# Patient Record
Sex: Female | Born: 1940
Health system: Southern US, Community
[De-identification: ages and names within clinical notes are randomized; demographics above are authoritative.]

## PROBLEM LIST (undated history)

## (undated) DIAGNOSIS — I4891 Unspecified atrial fibrillation: Secondary | ICD-10-CM

## (undated) DIAGNOSIS — J449 Chronic obstructive pulmonary disease, unspecified: Secondary | ICD-10-CM

## (undated) DIAGNOSIS — R0902 Hypoxemia: Secondary | ICD-10-CM

## (undated) DIAGNOSIS — E785 Hyperlipidemia, unspecified: Secondary | ICD-10-CM

## (undated) DIAGNOSIS — M810 Age-related osteoporosis without current pathological fracture: Secondary | ICD-10-CM

## (undated) DIAGNOSIS — N9489 Other specified conditions associated with female genital organs and menstrual cycle: Secondary | ICD-10-CM

## (undated) DIAGNOSIS — K219 Gastro-esophageal reflux disease without esophagitis: Secondary | ICD-10-CM

## (undated) HISTORY — PX: KYPHOPLASTY: SHX5884

## (undated) HISTORY — DX: Chronic obstructive pulmonary disease, unspecified: J44.9

## (undated) HISTORY — DX: Gastro-esophageal reflux disease without esophagitis: K21.9

## (undated) HISTORY — DX: Hypoxemia: R09.02

## (undated) HISTORY — DX: Unspecified atrial fibrillation: I48.91

## (undated) HISTORY — DX: Hyperlipidemia, unspecified: E78.5

## (undated) HISTORY — DX: Other specified conditions associated with female genital organs and menstrual cycle: N94.89

## (undated) HISTORY — DX: Age-related osteoporosis without current pathological fracture: M81.0

## (undated) HISTORY — PX: APPENDECTOMY: SHX54

---

## 2014-07-13 DIAGNOSIS — E785 Hyperlipidemia, unspecified: Secondary | ICD-10-CM | POA: Diagnosis not present

## 2014-07-13 DIAGNOSIS — F17210 Nicotine dependence, cigarettes, uncomplicated: Secondary | ICD-10-CM | POA: Diagnosis not present

## 2014-07-13 DIAGNOSIS — R9431 Abnormal electrocardiogram [ECG] [EKG]: Secondary | ICD-10-CM | POA: Diagnosis not present

## 2014-07-13 DIAGNOSIS — Z720 Tobacco use: Secondary | ICD-10-CM | POA: Diagnosis not present

## 2014-07-13 DIAGNOSIS — M1990 Unspecified osteoarthritis, unspecified site: Secondary | ICD-10-CM | POA: Diagnosis not present

## 2014-07-13 DIAGNOSIS — Z8249 Family history of ischemic heart disease and other diseases of the circulatory system: Secondary | ICD-10-CM | POA: Diagnosis not present

## 2014-07-13 DIAGNOSIS — R0789 Other chest pain: Secondary | ICD-10-CM | POA: Diagnosis not present

## 2014-07-13 DIAGNOSIS — I10 Essential (primary) hypertension: Secondary | ICD-10-CM | POA: Diagnosis not present

## 2014-07-13 DIAGNOSIS — I350 Nonrheumatic aortic (valve) stenosis: Secondary | ICD-10-CM | POA: Diagnosis not present

## 2014-07-13 DIAGNOSIS — Z9851 Tubal ligation status: Secondary | ICD-10-CM | POA: Diagnosis not present

## 2014-07-13 DIAGNOSIS — I493 Ventricular premature depolarization: Secondary | ICD-10-CM | POA: Diagnosis not present

## 2014-07-13 DIAGNOSIS — R001 Bradycardia, unspecified: Secondary | ICD-10-CM | POA: Diagnosis not present

## 2014-07-13 DIAGNOSIS — R072 Precordial pain: Secondary | ICD-10-CM | POA: Diagnosis not present

## 2014-07-13 DIAGNOSIS — R002 Palpitations: Secondary | ICD-10-CM | POA: Diagnosis not present

## 2014-07-13 DIAGNOSIS — J449 Chronic obstructive pulmonary disease, unspecified: Secondary | ICD-10-CM | POA: Diagnosis not present

## 2014-07-13 DIAGNOSIS — Z716 Tobacco abuse counseling: Secondary | ICD-10-CM | POA: Diagnosis not present

## 2014-07-13 DIAGNOSIS — E780 Pure hypercholesterolemia: Secondary | ICD-10-CM | POA: Diagnosis not present

## 2014-07-13 DIAGNOSIS — I499 Cardiac arrhythmia, unspecified: Secondary | ICD-10-CM | POA: Diagnosis not present

## 2014-07-13 DIAGNOSIS — R008 Other abnormalities of heart beat: Secondary | ICD-10-CM | POA: Diagnosis not present

## 2014-07-13 DIAGNOSIS — K08409 Partial loss of teeth, unspecified cause, unspecified class: Secondary | ICD-10-CM | POA: Diagnosis not present

## 2014-07-13 DIAGNOSIS — J209 Acute bronchitis, unspecified: Secondary | ICD-10-CM | POA: Diagnosis not present

## 2014-07-13 DIAGNOSIS — J441 Chronic obstructive pulmonary disease with (acute) exacerbation: Secondary | ICD-10-CM | POA: Diagnosis not present

## 2014-07-13 DIAGNOSIS — I498 Other specified cardiac arrhythmias: Secondary | ICD-10-CM | POA: Diagnosis not present

## 2014-07-18 DIAGNOSIS — D3910 Neoplasm of uncertain behavior of unspecified ovary: Secondary | ICD-10-CM | POA: Diagnosis not present

## 2014-07-18 DIAGNOSIS — R002 Palpitations: Secondary | ICD-10-CM | POA: Diagnosis not present

## 2014-08-01 DIAGNOSIS — J441 Chronic obstructive pulmonary disease with (acute) exacerbation: Secondary | ICD-10-CM | POA: Diagnosis not present

## 2014-08-06 DIAGNOSIS — R079 Chest pain, unspecified: Secondary | ICD-10-CM | POA: Diagnosis not present

## 2014-08-13 DIAGNOSIS — J449 Chronic obstructive pulmonary disease, unspecified: Secondary | ICD-10-CM | POA: Diagnosis not present

## 2014-08-15 DIAGNOSIS — I493 Ventricular premature depolarization: Secondary | ICD-10-CM | POA: Diagnosis not present

## 2014-08-15 DIAGNOSIS — J309 Allergic rhinitis, unspecified: Secondary | ICD-10-CM | POA: Diagnosis not present

## 2014-08-15 DIAGNOSIS — J449 Chronic obstructive pulmonary disease, unspecified: Secondary | ICD-10-CM | POA: Diagnosis not present

## 2014-09-13 DIAGNOSIS — J449 Chronic obstructive pulmonary disease, unspecified: Secondary | ICD-10-CM | POA: Diagnosis not present

## 2014-09-17 DIAGNOSIS — J449 Chronic obstructive pulmonary disease, unspecified: Secondary | ICD-10-CM | POA: Diagnosis not present

## 2014-09-21 DIAGNOSIS — I10 Essential (primary) hypertension: Secondary | ICD-10-CM | POA: Diagnosis not present

## 2014-10-13 DIAGNOSIS — J449 Chronic obstructive pulmonary disease, unspecified: Secondary | ICD-10-CM | POA: Diagnosis not present

## 2014-11-13 DIAGNOSIS — J449 Chronic obstructive pulmonary disease, unspecified: Secondary | ICD-10-CM | POA: Diagnosis not present

## 2014-11-21 DIAGNOSIS — J441 Chronic obstructive pulmonary disease with (acute) exacerbation: Secondary | ICD-10-CM | POA: Diagnosis not present

## 2014-11-21 DIAGNOSIS — I10 Essential (primary) hypertension: Secondary | ICD-10-CM | POA: Diagnosis not present

## 2014-11-21 DIAGNOSIS — E782 Mixed hyperlipidemia: Secondary | ICD-10-CM | POA: Diagnosis not present

## 2014-11-29 DIAGNOSIS — E871 Hypo-osmolality and hyponatremia: Secondary | ICD-10-CM | POA: Diagnosis not present

## 2014-12-13 DIAGNOSIS — J449 Chronic obstructive pulmonary disease, unspecified: Secondary | ICD-10-CM | POA: Diagnosis not present

## 2015-01-13 DIAGNOSIS — J449 Chronic obstructive pulmonary disease, unspecified: Secondary | ICD-10-CM | POA: Diagnosis not present

## 2015-02-13 DIAGNOSIS — J449 Chronic obstructive pulmonary disease, unspecified: Secondary | ICD-10-CM | POA: Diagnosis not present

## 2015-02-26 DIAGNOSIS — J449 Chronic obstructive pulmonary disease, unspecified: Secondary | ICD-10-CM | POA: Diagnosis not present

## 2015-02-26 DIAGNOSIS — Z23 Encounter for immunization: Secondary | ICD-10-CM | POA: Diagnosis not present

## 2015-02-26 DIAGNOSIS — E782 Mixed hyperlipidemia: Secondary | ICD-10-CM | POA: Diagnosis not present

## 2015-02-26 DIAGNOSIS — I10 Essential (primary) hypertension: Secondary | ICD-10-CM | POA: Diagnosis not present

## 2015-04-15 DIAGNOSIS — J449 Chronic obstructive pulmonary disease, unspecified: Secondary | ICD-10-CM | POA: Diagnosis not present

## 2015-05-15 DIAGNOSIS — J449 Chronic obstructive pulmonary disease, unspecified: Secondary | ICD-10-CM | POA: Diagnosis not present

## 2015-06-15 DIAGNOSIS — J449 Chronic obstructive pulmonary disease, unspecified: Secondary | ICD-10-CM | POA: Diagnosis not present

## 2015-06-28 DIAGNOSIS — J449 Chronic obstructive pulmonary disease, unspecified: Secondary | ICD-10-CM | POA: Diagnosis not present

## 2015-06-28 DIAGNOSIS — J441 Chronic obstructive pulmonary disease with (acute) exacerbation: Secondary | ICD-10-CM | POA: Diagnosis not present

## 2015-06-28 DIAGNOSIS — I10 Essential (primary) hypertension: Secondary | ICD-10-CM | POA: Diagnosis not present

## 2015-06-28 DIAGNOSIS — E782 Mixed hyperlipidemia: Secondary | ICD-10-CM | POA: Diagnosis not present

## 2015-07-16 DIAGNOSIS — J449 Chronic obstructive pulmonary disease, unspecified: Secondary | ICD-10-CM | POA: Diagnosis not present

## 2015-08-13 DIAGNOSIS — J449 Chronic obstructive pulmonary disease, unspecified: Secondary | ICD-10-CM | POA: Diagnosis not present

## 2015-09-13 DIAGNOSIS — J449 Chronic obstructive pulmonary disease, unspecified: Secondary | ICD-10-CM | POA: Diagnosis not present

## 2015-10-13 DIAGNOSIS — J449 Chronic obstructive pulmonary disease, unspecified: Secondary | ICD-10-CM | POA: Diagnosis not present

## 2015-11-13 DIAGNOSIS — J449 Chronic obstructive pulmonary disease, unspecified: Secondary | ICD-10-CM | POA: Diagnosis not present

## 2015-12-13 DIAGNOSIS — R0600 Dyspnea, unspecified: Secondary | ICD-10-CM | POA: Diagnosis not present

## 2015-12-13 DIAGNOSIS — R0602 Shortness of breath: Secondary | ICD-10-CM | POA: Diagnosis not present

## 2015-12-13 DIAGNOSIS — J441 Chronic obstructive pulmonary disease with (acute) exacerbation: Secondary | ICD-10-CM | POA: Diagnosis not present

## 2015-12-13 DIAGNOSIS — J449 Chronic obstructive pulmonary disease, unspecified: Secondary | ICD-10-CM | POA: Diagnosis not present

## 2015-12-20 DIAGNOSIS — I10 Essential (primary) hypertension: Secondary | ICD-10-CM | POA: Diagnosis not present

## 2015-12-20 DIAGNOSIS — J441 Chronic obstructive pulmonary disease with (acute) exacerbation: Secondary | ICD-10-CM | POA: Diagnosis not present

## 2015-12-20 DIAGNOSIS — E782 Mixed hyperlipidemia: Secondary | ICD-10-CM | POA: Diagnosis not present

## 2015-12-20 DIAGNOSIS — R5383 Other fatigue: Secondary | ICD-10-CM | POA: Diagnosis not present

## 2016-01-13 DIAGNOSIS — J449 Chronic obstructive pulmonary disease, unspecified: Secondary | ICD-10-CM | POA: Diagnosis not present

## 2016-02-13 DIAGNOSIS — J449 Chronic obstructive pulmonary disease, unspecified: Secondary | ICD-10-CM | POA: Diagnosis not present

## 2016-03-14 DIAGNOSIS — J449 Chronic obstructive pulmonary disease, unspecified: Secondary | ICD-10-CM | POA: Diagnosis not present

## 2016-04-14 DIAGNOSIS — J449 Chronic obstructive pulmonary disease, unspecified: Secondary | ICD-10-CM | POA: Diagnosis not present

## 2016-05-14 DIAGNOSIS — J449 Chronic obstructive pulmonary disease, unspecified: Secondary | ICD-10-CM | POA: Diagnosis not present

## 2016-06-02 DIAGNOSIS — Z7982 Long term (current) use of aspirin: Secondary | ICD-10-CM | POA: Diagnosis not present

## 2016-06-02 DIAGNOSIS — J449 Chronic obstructive pulmonary disease, unspecified: Secondary | ICD-10-CM | POA: Diagnosis not present

## 2016-06-02 DIAGNOSIS — Z79899 Other long term (current) drug therapy: Secondary | ICD-10-CM | POA: Diagnosis not present

## 2016-06-02 DIAGNOSIS — R109 Unspecified abdominal pain: Secondary | ICD-10-CM | POA: Diagnosis not present

## 2016-06-02 DIAGNOSIS — K3580 Unspecified acute appendicitis: Secondary | ICD-10-CM | POA: Diagnosis not present

## 2016-06-02 DIAGNOSIS — I10 Essential (primary) hypertension: Secondary | ICD-10-CM | POA: Diagnosis not present

## 2016-06-02 DIAGNOSIS — K353 Acute appendicitis with localized peritonitis: Secondary | ICD-10-CM | POA: Diagnosis not present

## 2016-06-02 DIAGNOSIS — R079 Chest pain, unspecified: Secondary | ICD-10-CM | POA: Diagnosis not present

## 2016-06-02 DIAGNOSIS — R0602 Shortness of breath: Secondary | ICD-10-CM | POA: Diagnosis not present

## 2016-06-03 DIAGNOSIS — K3580 Unspecified acute appendicitis: Secondary | ICD-10-CM | POA: Diagnosis not present

## 2016-06-14 DIAGNOSIS — J449 Chronic obstructive pulmonary disease, unspecified: Secondary | ICD-10-CM | POA: Diagnosis not present

## 2016-06-25 DIAGNOSIS — K353 Acute appendicitis with localized peritonitis, without perforation or gangrene: Secondary | ICD-10-CM

## 2016-06-25 HISTORY — DX: Acute appendicitis with localized peritonitis, without perforation or gangrene: K35.30

## 2016-07-13 DIAGNOSIS — I10 Essential (primary) hypertension: Secondary | ICD-10-CM | POA: Diagnosis not present

## 2016-07-13 DIAGNOSIS — E782 Mixed hyperlipidemia: Secondary | ICD-10-CM | POA: Diagnosis not present

## 2016-07-13 DIAGNOSIS — J449 Chronic obstructive pulmonary disease, unspecified: Secondary | ICD-10-CM | POA: Diagnosis not present

## 2016-07-15 DIAGNOSIS — J449 Chronic obstructive pulmonary disease, unspecified: Secondary | ICD-10-CM | POA: Diagnosis not present

## 2016-08-12 DIAGNOSIS — J449 Chronic obstructive pulmonary disease, unspecified: Secondary | ICD-10-CM | POA: Diagnosis not present

## 2016-09-10 DIAGNOSIS — J441 Chronic obstructive pulmonary disease with (acute) exacerbation: Secondary | ICD-10-CM | POA: Diagnosis not present

## 2016-09-12 DIAGNOSIS — J449 Chronic obstructive pulmonary disease, unspecified: Secondary | ICD-10-CM | POA: Diagnosis not present

## 2016-10-12 DIAGNOSIS — J449 Chronic obstructive pulmonary disease, unspecified: Secondary | ICD-10-CM | POA: Diagnosis not present

## 2016-11-12 DIAGNOSIS — J449 Chronic obstructive pulmonary disease, unspecified: Secondary | ICD-10-CM | POA: Diagnosis not present

## 2016-11-24 DIAGNOSIS — J441 Chronic obstructive pulmonary disease with (acute) exacerbation: Secondary | ICD-10-CM | POA: Diagnosis not present

## 2016-12-12 DIAGNOSIS — J449 Chronic obstructive pulmonary disease, unspecified: Secondary | ICD-10-CM | POA: Diagnosis not present

## 2017-01-12 DIAGNOSIS — J449 Chronic obstructive pulmonary disease, unspecified: Secondary | ICD-10-CM | POA: Diagnosis not present

## 2017-02-12 DIAGNOSIS — J449 Chronic obstructive pulmonary disease, unspecified: Secondary | ICD-10-CM | POA: Diagnosis not present

## 2017-03-14 DIAGNOSIS — J449 Chronic obstructive pulmonary disease, unspecified: Secondary | ICD-10-CM | POA: Diagnosis not present

## 2017-05-14 DIAGNOSIS — J449 Chronic obstructive pulmonary disease, unspecified: Secondary | ICD-10-CM | POA: Diagnosis not present

## 2017-06-14 DIAGNOSIS — J449 Chronic obstructive pulmonary disease, unspecified: Secondary | ICD-10-CM | POA: Diagnosis not present

## 2017-07-15 DIAGNOSIS — J449 Chronic obstructive pulmonary disease, unspecified: Secondary | ICD-10-CM | POA: Diagnosis not present

## 2017-07-22 DIAGNOSIS — I10 Essential (primary) hypertension: Secondary | ICD-10-CM | POA: Diagnosis not present

## 2017-07-22 DIAGNOSIS — E782 Mixed hyperlipidemia: Secondary | ICD-10-CM | POA: Diagnosis not present

## 2017-07-22 DIAGNOSIS — J441 Chronic obstructive pulmonary disease with (acute) exacerbation: Secondary | ICD-10-CM | POA: Diagnosis not present

## 2017-08-12 DIAGNOSIS — J449 Chronic obstructive pulmonary disease, unspecified: Secondary | ICD-10-CM | POA: Diagnosis not present

## 2017-09-05 DIAGNOSIS — M545 Low back pain: Secondary | ICD-10-CM | POA: Diagnosis not present

## 2017-09-05 DIAGNOSIS — M549 Dorsalgia, unspecified: Secondary | ICD-10-CM | POA: Diagnosis not present

## 2017-09-05 DIAGNOSIS — S32020A Wedge compression fracture of second lumbar vertebra, initial encounter for closed fracture: Secondary | ICD-10-CM | POA: Diagnosis not present

## 2017-09-05 DIAGNOSIS — G8911 Acute pain due to trauma: Secondary | ICD-10-CM | POA: Diagnosis not present

## 2017-09-06 DIAGNOSIS — Z7982 Long term (current) use of aspirin: Secondary | ICD-10-CM | POA: Diagnosis not present

## 2017-09-06 DIAGNOSIS — E7800 Pure hypercholesterolemia, unspecified: Secondary | ICD-10-CM | POA: Diagnosis not present

## 2017-09-06 DIAGNOSIS — R1909 Other intra-abdominal and pelvic swelling, mass and lump: Secondary | ICD-10-CM | POA: Diagnosis not present

## 2017-09-06 DIAGNOSIS — R069 Unspecified abnormalities of breathing: Secondary | ICD-10-CM | POA: Diagnosis not present

## 2017-09-06 DIAGNOSIS — J9601 Acute respiratory failure with hypoxia: Secondary | ICD-10-CM | POA: Diagnosis not present

## 2017-09-06 DIAGNOSIS — Z79899 Other long term (current) drug therapy: Secondary | ICD-10-CM | POA: Diagnosis not present

## 2017-09-06 DIAGNOSIS — R0989 Other specified symptoms and signs involving the circulatory and respiratory systems: Secondary | ICD-10-CM | POA: Diagnosis not present

## 2017-09-06 DIAGNOSIS — S32020A Wedge compression fracture of second lumbar vertebra, initial encounter for closed fracture: Secondary | ICD-10-CM | POA: Diagnosis not present

## 2017-09-06 DIAGNOSIS — R0602 Shortness of breath: Secondary | ICD-10-CM | POA: Diagnosis not present

## 2017-09-06 DIAGNOSIS — J9621 Acute and chronic respiratory failure with hypoxia: Secondary | ICD-10-CM | POA: Diagnosis not present

## 2017-09-06 DIAGNOSIS — J441 Chronic obstructive pulmonary disease with (acute) exacerbation: Secondary | ICD-10-CM | POA: Diagnosis not present

## 2017-09-06 DIAGNOSIS — Z9981 Dependence on supplemental oxygen: Secondary | ICD-10-CM | POA: Diagnosis not present

## 2017-09-06 DIAGNOSIS — I10 Essential (primary) hypertension: Secondary | ICD-10-CM | POA: Diagnosis not present

## 2017-09-06 DIAGNOSIS — M1990 Unspecified osteoarthritis, unspecified site: Secondary | ICD-10-CM | POA: Diagnosis not present

## 2017-09-06 DIAGNOSIS — M545 Low back pain: Secondary | ICD-10-CM | POA: Diagnosis not present

## 2017-09-07 DIAGNOSIS — J9621 Acute and chronic respiratory failure with hypoxia: Secondary | ICD-10-CM | POA: Diagnosis not present

## 2017-09-07 DIAGNOSIS — I10 Essential (primary) hypertension: Secondary | ICD-10-CM | POA: Diagnosis not present

## 2017-09-07 DIAGNOSIS — J441 Chronic obstructive pulmonary disease with (acute) exacerbation: Secondary | ICD-10-CM | POA: Diagnosis not present

## 2017-09-07 DIAGNOSIS — R0602 Shortness of breath: Secondary | ICD-10-CM | POA: Diagnosis not present

## 2017-09-08 DIAGNOSIS — J441 Chronic obstructive pulmonary disease with (acute) exacerbation: Secondary | ICD-10-CM | POA: Diagnosis not present

## 2017-09-08 DIAGNOSIS — E279 Disorder of adrenal gland, unspecified: Secondary | ICD-10-CM | POA: Diagnosis not present

## 2017-09-08 DIAGNOSIS — I10 Essential (primary) hypertension: Secondary | ICD-10-CM | POA: Diagnosis not present

## 2017-09-08 DIAGNOSIS — Z9981 Dependence on supplemental oxygen: Secondary | ICD-10-CM | POA: Diagnosis not present

## 2017-09-08 DIAGNOSIS — E785 Hyperlipidemia, unspecified: Secondary | ICD-10-CM | POA: Diagnosis not present

## 2017-09-08 DIAGNOSIS — J449 Chronic obstructive pulmonary disease, unspecified: Secondary | ICD-10-CM | POA: Diagnosis not present

## 2017-09-08 DIAGNOSIS — Z79891 Long term (current) use of opiate analgesic: Secondary | ICD-10-CM | POA: Diagnosis not present

## 2017-09-08 DIAGNOSIS — S32020D Wedge compression fracture of second lumbar vertebra, subsequent encounter for fracture with routine healing: Secondary | ICD-10-CM | POA: Diagnosis not present

## 2017-09-08 DIAGNOSIS — Z7982 Long term (current) use of aspirin: Secondary | ICD-10-CM | POA: Diagnosis not present

## 2017-09-08 DIAGNOSIS — J9611 Chronic respiratory failure with hypoxia: Secondary | ICD-10-CM | POA: Diagnosis not present

## 2017-09-08 DIAGNOSIS — Z9181 History of falling: Secondary | ICD-10-CM | POA: Diagnosis not present

## 2017-09-08 DIAGNOSIS — M1991 Primary osteoarthritis, unspecified site: Secondary | ICD-10-CM | POA: Diagnosis not present

## 2017-09-09 DIAGNOSIS — J441 Chronic obstructive pulmonary disease with (acute) exacerbation: Secondary | ICD-10-CM | POA: Diagnosis not present

## 2017-09-09 DIAGNOSIS — E279 Disorder of adrenal gland, unspecified: Secondary | ICD-10-CM | POA: Diagnosis not present

## 2017-09-09 DIAGNOSIS — Z79891 Long term (current) use of opiate analgesic: Secondary | ICD-10-CM | POA: Diagnosis not present

## 2017-09-09 DIAGNOSIS — E785 Hyperlipidemia, unspecified: Secondary | ICD-10-CM | POA: Diagnosis not present

## 2017-09-09 DIAGNOSIS — Z9181 History of falling: Secondary | ICD-10-CM | POA: Diagnosis not present

## 2017-09-09 DIAGNOSIS — I10 Essential (primary) hypertension: Secondary | ICD-10-CM | POA: Diagnosis not present

## 2017-09-09 DIAGNOSIS — J9611 Chronic respiratory failure with hypoxia: Secondary | ICD-10-CM | POA: Diagnosis not present

## 2017-09-09 DIAGNOSIS — Z9981 Dependence on supplemental oxygen: Secondary | ICD-10-CM | POA: Diagnosis not present

## 2017-09-09 DIAGNOSIS — S32020D Wedge compression fracture of second lumbar vertebra, subsequent encounter for fracture with routine healing: Secondary | ICD-10-CM | POA: Diagnosis not present

## 2017-09-09 DIAGNOSIS — Z7982 Long term (current) use of aspirin: Secondary | ICD-10-CM | POA: Diagnosis not present

## 2017-09-09 DIAGNOSIS — M1991 Primary osteoarthritis, unspecified site: Secondary | ICD-10-CM | POA: Diagnosis not present

## 2017-09-12 DIAGNOSIS — Z9981 Dependence on supplemental oxygen: Secondary | ICD-10-CM | POA: Diagnosis not present

## 2017-09-12 DIAGNOSIS — S32020D Wedge compression fracture of second lumbar vertebra, subsequent encounter for fracture with routine healing: Secondary | ICD-10-CM | POA: Diagnosis not present

## 2017-09-12 DIAGNOSIS — I10 Essential (primary) hypertension: Secondary | ICD-10-CM | POA: Diagnosis not present

## 2017-09-12 DIAGNOSIS — J449 Chronic obstructive pulmonary disease, unspecified: Secondary | ICD-10-CM | POA: Diagnosis not present

## 2017-09-12 DIAGNOSIS — Z79891 Long term (current) use of opiate analgesic: Secondary | ICD-10-CM | POA: Diagnosis not present

## 2017-09-12 DIAGNOSIS — J9611 Chronic respiratory failure with hypoxia: Secondary | ICD-10-CM | POA: Diagnosis not present

## 2017-09-12 DIAGNOSIS — M1991 Primary osteoarthritis, unspecified site: Secondary | ICD-10-CM | POA: Diagnosis not present

## 2017-09-12 DIAGNOSIS — J441 Chronic obstructive pulmonary disease with (acute) exacerbation: Secondary | ICD-10-CM | POA: Diagnosis not present

## 2017-09-12 DIAGNOSIS — E279 Disorder of adrenal gland, unspecified: Secondary | ICD-10-CM | POA: Diagnosis not present

## 2017-09-12 DIAGNOSIS — Z7982 Long term (current) use of aspirin: Secondary | ICD-10-CM | POA: Diagnosis not present

## 2017-09-12 DIAGNOSIS — E785 Hyperlipidemia, unspecified: Secondary | ICD-10-CM | POA: Diagnosis not present

## 2017-09-12 DIAGNOSIS — Z9181 History of falling: Secondary | ICD-10-CM | POA: Diagnosis not present

## 2017-09-13 DIAGNOSIS — J441 Chronic obstructive pulmonary disease with (acute) exacerbation: Secondary | ICD-10-CM | POA: Diagnosis not present

## 2017-09-14 DIAGNOSIS — Z79891 Long term (current) use of opiate analgesic: Secondary | ICD-10-CM | POA: Diagnosis not present

## 2017-09-14 DIAGNOSIS — Z9181 History of falling: Secondary | ICD-10-CM | POA: Diagnosis not present

## 2017-09-14 DIAGNOSIS — S32020D Wedge compression fracture of second lumbar vertebra, subsequent encounter for fracture with routine healing: Secondary | ICD-10-CM | POA: Diagnosis not present

## 2017-09-14 DIAGNOSIS — E279 Disorder of adrenal gland, unspecified: Secondary | ICD-10-CM | POA: Diagnosis not present

## 2017-09-14 DIAGNOSIS — M1991 Primary osteoarthritis, unspecified site: Secondary | ICD-10-CM | POA: Diagnosis not present

## 2017-09-14 DIAGNOSIS — J441 Chronic obstructive pulmonary disease with (acute) exacerbation: Secondary | ICD-10-CM | POA: Diagnosis not present

## 2017-09-14 DIAGNOSIS — Z7982 Long term (current) use of aspirin: Secondary | ICD-10-CM | POA: Diagnosis not present

## 2017-09-14 DIAGNOSIS — J9611 Chronic respiratory failure with hypoxia: Secondary | ICD-10-CM | POA: Diagnosis not present

## 2017-09-14 DIAGNOSIS — I10 Essential (primary) hypertension: Secondary | ICD-10-CM | POA: Diagnosis not present

## 2017-09-14 DIAGNOSIS — E785 Hyperlipidemia, unspecified: Secondary | ICD-10-CM | POA: Diagnosis not present

## 2017-09-14 DIAGNOSIS — Z9981 Dependence on supplemental oxygen: Secondary | ICD-10-CM | POA: Diagnosis not present

## 2017-09-15 DIAGNOSIS — Z9181 History of falling: Secondary | ICD-10-CM | POA: Diagnosis not present

## 2017-09-15 DIAGNOSIS — M1991 Primary osteoarthritis, unspecified site: Secondary | ICD-10-CM | POA: Diagnosis not present

## 2017-09-15 DIAGNOSIS — Z7982 Long term (current) use of aspirin: Secondary | ICD-10-CM | POA: Diagnosis not present

## 2017-09-15 DIAGNOSIS — J441 Chronic obstructive pulmonary disease with (acute) exacerbation: Secondary | ICD-10-CM | POA: Diagnosis not present

## 2017-09-15 DIAGNOSIS — E785 Hyperlipidemia, unspecified: Secondary | ICD-10-CM | POA: Diagnosis not present

## 2017-09-15 DIAGNOSIS — Z9981 Dependence on supplemental oxygen: Secondary | ICD-10-CM | POA: Diagnosis not present

## 2017-09-15 DIAGNOSIS — S32020D Wedge compression fracture of second lumbar vertebra, subsequent encounter for fracture with routine healing: Secondary | ICD-10-CM | POA: Diagnosis not present

## 2017-09-15 DIAGNOSIS — Z79891 Long term (current) use of opiate analgesic: Secondary | ICD-10-CM | POA: Diagnosis not present

## 2017-09-15 DIAGNOSIS — I10 Essential (primary) hypertension: Secondary | ICD-10-CM | POA: Diagnosis not present

## 2017-09-15 DIAGNOSIS — E279 Disorder of adrenal gland, unspecified: Secondary | ICD-10-CM | POA: Diagnosis not present

## 2017-09-15 DIAGNOSIS — J9611 Chronic respiratory failure with hypoxia: Secondary | ICD-10-CM | POA: Diagnosis not present

## 2017-09-16 DIAGNOSIS — Z9181 History of falling: Secondary | ICD-10-CM | POA: Diagnosis not present

## 2017-09-16 DIAGNOSIS — J441 Chronic obstructive pulmonary disease with (acute) exacerbation: Secondary | ICD-10-CM | POA: Diagnosis not present

## 2017-09-16 DIAGNOSIS — Z9981 Dependence on supplemental oxygen: Secondary | ICD-10-CM | POA: Diagnosis not present

## 2017-09-16 DIAGNOSIS — E785 Hyperlipidemia, unspecified: Secondary | ICD-10-CM | POA: Diagnosis not present

## 2017-09-16 DIAGNOSIS — E279 Disorder of adrenal gland, unspecified: Secondary | ICD-10-CM | POA: Diagnosis not present

## 2017-09-16 DIAGNOSIS — Z7982 Long term (current) use of aspirin: Secondary | ICD-10-CM | POA: Diagnosis not present

## 2017-09-16 DIAGNOSIS — S32020D Wedge compression fracture of second lumbar vertebra, subsequent encounter for fracture with routine healing: Secondary | ICD-10-CM | POA: Diagnosis not present

## 2017-09-16 DIAGNOSIS — Z79891 Long term (current) use of opiate analgesic: Secondary | ICD-10-CM | POA: Diagnosis not present

## 2017-09-16 DIAGNOSIS — M1991 Primary osteoarthritis, unspecified site: Secondary | ICD-10-CM | POA: Diagnosis not present

## 2017-09-16 DIAGNOSIS — I10 Essential (primary) hypertension: Secondary | ICD-10-CM | POA: Diagnosis not present

## 2017-09-16 DIAGNOSIS — J9611 Chronic respiratory failure with hypoxia: Secondary | ICD-10-CM | POA: Diagnosis not present

## 2017-09-20 DIAGNOSIS — E785 Hyperlipidemia, unspecified: Secondary | ICD-10-CM | POA: Diagnosis not present

## 2017-09-20 DIAGNOSIS — J9611 Chronic respiratory failure with hypoxia: Secondary | ICD-10-CM | POA: Diagnosis not present

## 2017-09-20 DIAGNOSIS — Z7982 Long term (current) use of aspirin: Secondary | ICD-10-CM | POA: Diagnosis not present

## 2017-09-20 DIAGNOSIS — J441 Chronic obstructive pulmonary disease with (acute) exacerbation: Secondary | ICD-10-CM | POA: Diagnosis not present

## 2017-09-20 DIAGNOSIS — I10 Essential (primary) hypertension: Secondary | ICD-10-CM | POA: Diagnosis not present

## 2017-09-20 DIAGNOSIS — Z9181 History of falling: Secondary | ICD-10-CM | POA: Diagnosis not present

## 2017-09-20 DIAGNOSIS — Z79891 Long term (current) use of opiate analgesic: Secondary | ICD-10-CM | POA: Diagnosis not present

## 2017-09-20 DIAGNOSIS — M1991 Primary osteoarthritis, unspecified site: Secondary | ICD-10-CM | POA: Diagnosis not present

## 2017-09-20 DIAGNOSIS — S32020D Wedge compression fracture of second lumbar vertebra, subsequent encounter for fracture with routine healing: Secondary | ICD-10-CM | POA: Diagnosis not present

## 2017-09-20 DIAGNOSIS — E279 Disorder of adrenal gland, unspecified: Secondary | ICD-10-CM | POA: Diagnosis not present

## 2017-09-20 DIAGNOSIS — Z9981 Dependence on supplemental oxygen: Secondary | ICD-10-CM | POA: Diagnosis not present

## 2017-09-21 DIAGNOSIS — Z7982 Long term (current) use of aspirin: Secondary | ICD-10-CM | POA: Diagnosis not present

## 2017-09-21 DIAGNOSIS — E785 Hyperlipidemia, unspecified: Secondary | ICD-10-CM | POA: Diagnosis not present

## 2017-09-21 DIAGNOSIS — Z9981 Dependence on supplemental oxygen: Secondary | ICD-10-CM | POA: Diagnosis not present

## 2017-09-21 DIAGNOSIS — Z9181 History of falling: Secondary | ICD-10-CM | POA: Diagnosis not present

## 2017-09-21 DIAGNOSIS — Z79891 Long term (current) use of opiate analgesic: Secondary | ICD-10-CM | POA: Diagnosis not present

## 2017-09-21 DIAGNOSIS — S32020D Wedge compression fracture of second lumbar vertebra, subsequent encounter for fracture with routine healing: Secondary | ICD-10-CM | POA: Diagnosis not present

## 2017-09-21 DIAGNOSIS — J441 Chronic obstructive pulmonary disease with (acute) exacerbation: Secondary | ICD-10-CM | POA: Diagnosis not present

## 2017-09-21 DIAGNOSIS — E279 Disorder of adrenal gland, unspecified: Secondary | ICD-10-CM | POA: Diagnosis not present

## 2017-09-21 DIAGNOSIS — M1991 Primary osteoarthritis, unspecified site: Secondary | ICD-10-CM | POA: Diagnosis not present

## 2017-09-21 DIAGNOSIS — J9611 Chronic respiratory failure with hypoxia: Secondary | ICD-10-CM | POA: Diagnosis not present

## 2017-09-21 DIAGNOSIS — I10 Essential (primary) hypertension: Secondary | ICD-10-CM | POA: Diagnosis not present

## 2017-09-23 DIAGNOSIS — Z7982 Long term (current) use of aspirin: Secondary | ICD-10-CM | POA: Diagnosis not present

## 2017-09-23 DIAGNOSIS — E279 Disorder of adrenal gland, unspecified: Secondary | ICD-10-CM | POA: Diagnosis not present

## 2017-09-23 DIAGNOSIS — J9611 Chronic respiratory failure with hypoxia: Secondary | ICD-10-CM | POA: Diagnosis not present

## 2017-09-23 DIAGNOSIS — S32020D Wedge compression fracture of second lumbar vertebra, subsequent encounter for fracture with routine healing: Secondary | ICD-10-CM | POA: Diagnosis not present

## 2017-09-23 DIAGNOSIS — Z79891 Long term (current) use of opiate analgesic: Secondary | ICD-10-CM | POA: Diagnosis not present

## 2017-09-23 DIAGNOSIS — Z9181 History of falling: Secondary | ICD-10-CM | POA: Diagnosis not present

## 2017-09-23 DIAGNOSIS — I10 Essential (primary) hypertension: Secondary | ICD-10-CM | POA: Diagnosis not present

## 2017-09-23 DIAGNOSIS — E785 Hyperlipidemia, unspecified: Secondary | ICD-10-CM | POA: Diagnosis not present

## 2017-09-23 DIAGNOSIS — Z9981 Dependence on supplemental oxygen: Secondary | ICD-10-CM | POA: Diagnosis not present

## 2017-09-23 DIAGNOSIS — J441 Chronic obstructive pulmonary disease with (acute) exacerbation: Secondary | ICD-10-CM | POA: Diagnosis not present

## 2017-09-23 DIAGNOSIS — M1991 Primary osteoarthritis, unspecified site: Secondary | ICD-10-CM | POA: Diagnosis not present

## 2017-09-27 DIAGNOSIS — E279 Disorder of adrenal gland, unspecified: Secondary | ICD-10-CM | POA: Diagnosis not present

## 2017-09-27 DIAGNOSIS — Z79891 Long term (current) use of opiate analgesic: Secondary | ICD-10-CM | POA: Diagnosis not present

## 2017-09-27 DIAGNOSIS — Z9181 History of falling: Secondary | ICD-10-CM | POA: Diagnosis not present

## 2017-09-27 DIAGNOSIS — M1991 Primary osteoarthritis, unspecified site: Secondary | ICD-10-CM | POA: Diagnosis not present

## 2017-09-27 DIAGNOSIS — J9611 Chronic respiratory failure with hypoxia: Secondary | ICD-10-CM | POA: Diagnosis not present

## 2017-09-27 DIAGNOSIS — E785 Hyperlipidemia, unspecified: Secondary | ICD-10-CM | POA: Diagnosis not present

## 2017-09-27 DIAGNOSIS — Z9981 Dependence on supplemental oxygen: Secondary | ICD-10-CM | POA: Diagnosis not present

## 2017-09-27 DIAGNOSIS — J441 Chronic obstructive pulmonary disease with (acute) exacerbation: Secondary | ICD-10-CM | POA: Diagnosis not present

## 2017-09-27 DIAGNOSIS — Z7982 Long term (current) use of aspirin: Secondary | ICD-10-CM | POA: Diagnosis not present

## 2017-09-27 DIAGNOSIS — S32020D Wedge compression fracture of second lumbar vertebra, subsequent encounter for fracture with routine healing: Secondary | ICD-10-CM | POA: Diagnosis not present

## 2017-09-27 DIAGNOSIS — I10 Essential (primary) hypertension: Secondary | ICD-10-CM | POA: Diagnosis not present

## 2017-09-28 DIAGNOSIS — J9611 Chronic respiratory failure with hypoxia: Secondary | ICD-10-CM | POA: Diagnosis not present

## 2017-09-28 DIAGNOSIS — M1991 Primary osteoarthritis, unspecified site: Secondary | ICD-10-CM | POA: Diagnosis not present

## 2017-09-28 DIAGNOSIS — E785 Hyperlipidemia, unspecified: Secondary | ICD-10-CM | POA: Diagnosis not present

## 2017-09-28 DIAGNOSIS — E279 Disorder of adrenal gland, unspecified: Secondary | ICD-10-CM | POA: Diagnosis not present

## 2017-09-28 DIAGNOSIS — S32020D Wedge compression fracture of second lumbar vertebra, subsequent encounter for fracture with routine healing: Secondary | ICD-10-CM | POA: Diagnosis not present

## 2017-09-28 DIAGNOSIS — I10 Essential (primary) hypertension: Secondary | ICD-10-CM | POA: Diagnosis not present

## 2017-09-28 DIAGNOSIS — Z9981 Dependence on supplemental oxygen: Secondary | ICD-10-CM | POA: Diagnosis not present

## 2017-09-28 DIAGNOSIS — Z79891 Long term (current) use of opiate analgesic: Secondary | ICD-10-CM | POA: Diagnosis not present

## 2017-09-28 DIAGNOSIS — Z7982 Long term (current) use of aspirin: Secondary | ICD-10-CM | POA: Diagnosis not present

## 2017-09-28 DIAGNOSIS — Z9181 History of falling: Secondary | ICD-10-CM | POA: Diagnosis not present

## 2017-09-28 DIAGNOSIS — J441 Chronic obstructive pulmonary disease with (acute) exacerbation: Secondary | ICD-10-CM | POA: Diagnosis not present

## 2017-09-29 DIAGNOSIS — Z9981 Dependence on supplemental oxygen: Secondary | ICD-10-CM | POA: Diagnosis not present

## 2017-09-29 DIAGNOSIS — J9611 Chronic respiratory failure with hypoxia: Secondary | ICD-10-CM | POA: Diagnosis not present

## 2017-09-29 DIAGNOSIS — I10 Essential (primary) hypertension: Secondary | ICD-10-CM | POA: Diagnosis not present

## 2017-09-29 DIAGNOSIS — Z79891 Long term (current) use of opiate analgesic: Secondary | ICD-10-CM | POA: Diagnosis not present

## 2017-09-29 DIAGNOSIS — S32020D Wedge compression fracture of second lumbar vertebra, subsequent encounter for fracture with routine healing: Secondary | ICD-10-CM | POA: Diagnosis not present

## 2017-09-29 DIAGNOSIS — J441 Chronic obstructive pulmonary disease with (acute) exacerbation: Secondary | ICD-10-CM | POA: Diagnosis not present

## 2017-09-29 DIAGNOSIS — E279 Disorder of adrenal gland, unspecified: Secondary | ICD-10-CM | POA: Diagnosis not present

## 2017-09-29 DIAGNOSIS — M1991 Primary osteoarthritis, unspecified site: Secondary | ICD-10-CM | POA: Diagnosis not present

## 2017-09-29 DIAGNOSIS — E785 Hyperlipidemia, unspecified: Secondary | ICD-10-CM | POA: Diagnosis not present

## 2017-09-29 DIAGNOSIS — Z7982 Long term (current) use of aspirin: Secondary | ICD-10-CM | POA: Diagnosis not present

## 2017-09-29 DIAGNOSIS — Z9181 History of falling: Secondary | ICD-10-CM | POA: Diagnosis not present

## 2017-10-04 DIAGNOSIS — Z9981 Dependence on supplemental oxygen: Secondary | ICD-10-CM | POA: Diagnosis not present

## 2017-10-04 DIAGNOSIS — I10 Essential (primary) hypertension: Secondary | ICD-10-CM | POA: Diagnosis not present

## 2017-10-04 DIAGNOSIS — J441 Chronic obstructive pulmonary disease with (acute) exacerbation: Secondary | ICD-10-CM | POA: Diagnosis not present

## 2017-10-04 DIAGNOSIS — Z9181 History of falling: Secondary | ICD-10-CM | POA: Diagnosis not present

## 2017-10-04 DIAGNOSIS — Z79891 Long term (current) use of opiate analgesic: Secondary | ICD-10-CM | POA: Diagnosis not present

## 2017-10-04 DIAGNOSIS — Z7982 Long term (current) use of aspirin: Secondary | ICD-10-CM | POA: Diagnosis not present

## 2017-10-04 DIAGNOSIS — E279 Disorder of adrenal gland, unspecified: Secondary | ICD-10-CM | POA: Diagnosis not present

## 2017-10-04 DIAGNOSIS — S32020D Wedge compression fracture of second lumbar vertebra, subsequent encounter for fracture with routine healing: Secondary | ICD-10-CM | POA: Diagnosis not present

## 2017-10-04 DIAGNOSIS — M1991 Primary osteoarthritis, unspecified site: Secondary | ICD-10-CM | POA: Diagnosis not present

## 2017-10-04 DIAGNOSIS — J9611 Chronic respiratory failure with hypoxia: Secondary | ICD-10-CM | POA: Diagnosis not present

## 2017-10-04 DIAGNOSIS — E785 Hyperlipidemia, unspecified: Secondary | ICD-10-CM | POA: Diagnosis not present

## 2017-10-05 DIAGNOSIS — E279 Disorder of adrenal gland, unspecified: Secondary | ICD-10-CM | POA: Diagnosis not present

## 2017-10-05 DIAGNOSIS — Z7982 Long term (current) use of aspirin: Secondary | ICD-10-CM | POA: Diagnosis not present

## 2017-10-05 DIAGNOSIS — J9611 Chronic respiratory failure with hypoxia: Secondary | ICD-10-CM | POA: Diagnosis not present

## 2017-10-05 DIAGNOSIS — J441 Chronic obstructive pulmonary disease with (acute) exacerbation: Secondary | ICD-10-CM | POA: Diagnosis not present

## 2017-10-05 DIAGNOSIS — S32020D Wedge compression fracture of second lumbar vertebra, subsequent encounter for fracture with routine healing: Secondary | ICD-10-CM | POA: Diagnosis not present

## 2017-10-05 DIAGNOSIS — E785 Hyperlipidemia, unspecified: Secondary | ICD-10-CM | POA: Diagnosis not present

## 2017-10-05 DIAGNOSIS — M1991 Primary osteoarthritis, unspecified site: Secondary | ICD-10-CM | POA: Diagnosis not present

## 2017-10-05 DIAGNOSIS — Z9981 Dependence on supplemental oxygen: Secondary | ICD-10-CM | POA: Diagnosis not present

## 2017-10-05 DIAGNOSIS — I10 Essential (primary) hypertension: Secondary | ICD-10-CM | POA: Diagnosis not present

## 2017-10-05 DIAGNOSIS — Z79891 Long term (current) use of opiate analgesic: Secondary | ICD-10-CM | POA: Diagnosis not present

## 2017-10-05 DIAGNOSIS — Z9181 History of falling: Secondary | ICD-10-CM | POA: Diagnosis not present

## 2017-10-08 DIAGNOSIS — J441 Chronic obstructive pulmonary disease with (acute) exacerbation: Secondary | ICD-10-CM | POA: Diagnosis not present

## 2017-10-08 DIAGNOSIS — Z79891 Long term (current) use of opiate analgesic: Secondary | ICD-10-CM | POA: Diagnosis not present

## 2017-10-08 DIAGNOSIS — S32020D Wedge compression fracture of second lumbar vertebra, subsequent encounter for fracture with routine healing: Secondary | ICD-10-CM | POA: Diagnosis not present

## 2017-10-08 DIAGNOSIS — Z9181 History of falling: Secondary | ICD-10-CM | POA: Diagnosis not present

## 2017-10-08 DIAGNOSIS — J9611 Chronic respiratory failure with hypoxia: Secondary | ICD-10-CM | POA: Diagnosis not present

## 2017-10-08 DIAGNOSIS — M1991 Primary osteoarthritis, unspecified site: Secondary | ICD-10-CM | POA: Diagnosis not present

## 2017-10-08 DIAGNOSIS — E785 Hyperlipidemia, unspecified: Secondary | ICD-10-CM | POA: Diagnosis not present

## 2017-10-08 DIAGNOSIS — Z9981 Dependence on supplemental oxygen: Secondary | ICD-10-CM | POA: Diagnosis not present

## 2017-10-08 DIAGNOSIS — I10 Essential (primary) hypertension: Secondary | ICD-10-CM | POA: Diagnosis not present

## 2017-10-08 DIAGNOSIS — E279 Disorder of adrenal gland, unspecified: Secondary | ICD-10-CM | POA: Diagnosis not present

## 2017-10-08 DIAGNOSIS — Z7982 Long term (current) use of aspirin: Secondary | ICD-10-CM | POA: Diagnosis not present

## 2017-10-09 DIAGNOSIS — J449 Chronic obstructive pulmonary disease, unspecified: Secondary | ICD-10-CM | POA: Diagnosis not present

## 2017-10-12 DIAGNOSIS — Z9181 History of falling: Secondary | ICD-10-CM | POA: Diagnosis not present

## 2017-10-12 DIAGNOSIS — Z7982 Long term (current) use of aspirin: Secondary | ICD-10-CM | POA: Diagnosis not present

## 2017-10-12 DIAGNOSIS — M1991 Primary osteoarthritis, unspecified site: Secondary | ICD-10-CM | POA: Diagnosis not present

## 2017-10-12 DIAGNOSIS — Z79891 Long term (current) use of opiate analgesic: Secondary | ICD-10-CM | POA: Diagnosis not present

## 2017-10-12 DIAGNOSIS — J449 Chronic obstructive pulmonary disease, unspecified: Secondary | ICD-10-CM | POA: Diagnosis not present

## 2017-10-12 DIAGNOSIS — I10 Essential (primary) hypertension: Secondary | ICD-10-CM | POA: Diagnosis not present

## 2017-10-12 DIAGNOSIS — J9611 Chronic respiratory failure with hypoxia: Secondary | ICD-10-CM | POA: Diagnosis not present

## 2017-10-12 DIAGNOSIS — E279 Disorder of adrenal gland, unspecified: Secondary | ICD-10-CM | POA: Diagnosis not present

## 2017-10-12 DIAGNOSIS — J441 Chronic obstructive pulmonary disease with (acute) exacerbation: Secondary | ICD-10-CM | POA: Diagnosis not present

## 2017-10-12 DIAGNOSIS — S32020D Wedge compression fracture of second lumbar vertebra, subsequent encounter for fracture with routine healing: Secondary | ICD-10-CM | POA: Diagnosis not present

## 2017-10-12 DIAGNOSIS — E785 Hyperlipidemia, unspecified: Secondary | ICD-10-CM | POA: Diagnosis not present

## 2017-10-12 DIAGNOSIS — Z9981 Dependence on supplemental oxygen: Secondary | ICD-10-CM | POA: Diagnosis not present

## 2017-10-19 DIAGNOSIS — M1991 Primary osteoarthritis, unspecified site: Secondary | ICD-10-CM | POA: Diagnosis not present

## 2017-10-19 DIAGNOSIS — E785 Hyperlipidemia, unspecified: Secondary | ICD-10-CM | POA: Diagnosis not present

## 2017-10-19 DIAGNOSIS — S32020D Wedge compression fracture of second lumbar vertebra, subsequent encounter for fracture with routine healing: Secondary | ICD-10-CM | POA: Diagnosis not present

## 2017-10-19 DIAGNOSIS — Z7982 Long term (current) use of aspirin: Secondary | ICD-10-CM | POA: Diagnosis not present

## 2017-10-19 DIAGNOSIS — Z9181 History of falling: Secondary | ICD-10-CM | POA: Diagnosis not present

## 2017-10-19 DIAGNOSIS — Z9981 Dependence on supplemental oxygen: Secondary | ICD-10-CM | POA: Diagnosis not present

## 2017-10-19 DIAGNOSIS — Z79891 Long term (current) use of opiate analgesic: Secondary | ICD-10-CM | POA: Diagnosis not present

## 2017-10-19 DIAGNOSIS — I10 Essential (primary) hypertension: Secondary | ICD-10-CM | POA: Diagnosis not present

## 2017-10-19 DIAGNOSIS — J441 Chronic obstructive pulmonary disease with (acute) exacerbation: Secondary | ICD-10-CM | POA: Diagnosis not present

## 2017-10-19 DIAGNOSIS — E279 Disorder of adrenal gland, unspecified: Secondary | ICD-10-CM | POA: Diagnosis not present

## 2017-10-19 DIAGNOSIS — J9611 Chronic respiratory failure with hypoxia: Secondary | ICD-10-CM | POA: Diagnosis not present

## 2017-10-20 DIAGNOSIS — J449 Chronic obstructive pulmonary disease, unspecified: Secondary | ICD-10-CM | POA: Diagnosis not present

## 2017-10-20 DIAGNOSIS — G720 Drug-induced myopathy: Secondary | ICD-10-CM | POA: Diagnosis not present

## 2017-10-20 DIAGNOSIS — I10 Essential (primary) hypertension: Secondary | ICD-10-CM | POA: Diagnosis not present

## 2017-10-20 DIAGNOSIS — D6489 Other specified anemias: Secondary | ICD-10-CM | POA: Diagnosis not present

## 2017-10-20 DIAGNOSIS — S32020D Wedge compression fracture of second lumbar vertebra, subsequent encounter for fracture with routine healing: Secondary | ICD-10-CM | POA: Diagnosis not present

## 2017-10-20 DIAGNOSIS — E782 Mixed hyperlipidemia: Secondary | ICD-10-CM | POA: Diagnosis not present

## 2017-10-26 DIAGNOSIS — E785 Hyperlipidemia, unspecified: Secondary | ICD-10-CM | POA: Diagnosis not present

## 2017-10-26 DIAGNOSIS — M1991 Primary osteoarthritis, unspecified site: Secondary | ICD-10-CM | POA: Diagnosis not present

## 2017-10-26 DIAGNOSIS — E279 Disorder of adrenal gland, unspecified: Secondary | ICD-10-CM | POA: Diagnosis not present

## 2017-10-26 DIAGNOSIS — Z9181 History of falling: Secondary | ICD-10-CM | POA: Diagnosis not present

## 2017-10-26 DIAGNOSIS — Z9981 Dependence on supplemental oxygen: Secondary | ICD-10-CM | POA: Diagnosis not present

## 2017-10-26 DIAGNOSIS — Z79891 Long term (current) use of opiate analgesic: Secondary | ICD-10-CM | POA: Diagnosis not present

## 2017-10-26 DIAGNOSIS — J441 Chronic obstructive pulmonary disease with (acute) exacerbation: Secondary | ICD-10-CM | POA: Diagnosis not present

## 2017-10-26 DIAGNOSIS — S32020D Wedge compression fracture of second lumbar vertebra, subsequent encounter for fracture with routine healing: Secondary | ICD-10-CM | POA: Diagnosis not present

## 2017-10-26 DIAGNOSIS — I10 Essential (primary) hypertension: Secondary | ICD-10-CM | POA: Diagnosis not present

## 2017-10-26 DIAGNOSIS — J9611 Chronic respiratory failure with hypoxia: Secondary | ICD-10-CM | POA: Diagnosis not present

## 2017-10-26 DIAGNOSIS — Z7982 Long term (current) use of aspirin: Secondary | ICD-10-CM | POA: Diagnosis not present

## 2017-10-27 DIAGNOSIS — M545 Low back pain: Secondary | ICD-10-CM | POA: Diagnosis not present

## 2017-10-27 DIAGNOSIS — M48061 Spinal stenosis, lumbar region without neurogenic claudication: Secondary | ICD-10-CM | POA: Diagnosis not present

## 2017-11-01 DIAGNOSIS — S32030A Wedge compression fracture of third lumbar vertebra, initial encounter for closed fracture: Secondary | ICD-10-CM | POA: Diagnosis not present

## 2017-11-01 DIAGNOSIS — S32020A Wedge compression fracture of second lumbar vertebra, initial encounter for closed fracture: Secondary | ICD-10-CM | POA: Diagnosis not present

## 2017-11-01 DIAGNOSIS — I10 Essential (primary) hypertension: Secondary | ICD-10-CM | POA: Diagnosis not present

## 2017-11-01 DIAGNOSIS — M545 Low back pain: Secondary | ICD-10-CM | POA: Diagnosis not present

## 2017-11-01 DIAGNOSIS — M810 Age-related osteoporosis without current pathological fracture: Secondary | ICD-10-CM | POA: Diagnosis not present

## 2017-11-01 DIAGNOSIS — J449 Chronic obstructive pulmonary disease, unspecified: Secondary | ICD-10-CM | POA: Diagnosis not present

## 2017-11-01 DIAGNOSIS — R29890 Loss of height: Secondary | ICD-10-CM | POA: Diagnosis not present

## 2017-11-02 DIAGNOSIS — Z9981 Dependence on supplemental oxygen: Secondary | ICD-10-CM | POA: Diagnosis not present

## 2017-11-02 DIAGNOSIS — J441 Chronic obstructive pulmonary disease with (acute) exacerbation: Secondary | ICD-10-CM | POA: Diagnosis not present

## 2017-11-02 DIAGNOSIS — M1991 Primary osteoarthritis, unspecified site: Secondary | ICD-10-CM | POA: Diagnosis not present

## 2017-11-02 DIAGNOSIS — J9611 Chronic respiratory failure with hypoxia: Secondary | ICD-10-CM | POA: Diagnosis not present

## 2017-11-02 DIAGNOSIS — Z79891 Long term (current) use of opiate analgesic: Secondary | ICD-10-CM | POA: Diagnosis not present

## 2017-11-02 DIAGNOSIS — I10 Essential (primary) hypertension: Secondary | ICD-10-CM | POA: Diagnosis not present

## 2017-11-02 DIAGNOSIS — E785 Hyperlipidemia, unspecified: Secondary | ICD-10-CM | POA: Diagnosis not present

## 2017-11-02 DIAGNOSIS — Z9181 History of falling: Secondary | ICD-10-CM | POA: Diagnosis not present

## 2017-11-02 DIAGNOSIS — Z7982 Long term (current) use of aspirin: Secondary | ICD-10-CM | POA: Diagnosis not present

## 2017-11-02 DIAGNOSIS — S32020D Wedge compression fracture of second lumbar vertebra, subsequent encounter for fracture with routine healing: Secondary | ICD-10-CM | POA: Diagnosis not present

## 2017-11-02 DIAGNOSIS — E279 Disorder of adrenal gland, unspecified: Secondary | ICD-10-CM | POA: Diagnosis not present

## 2017-11-04 DIAGNOSIS — J449 Chronic obstructive pulmonary disease, unspecified: Secondary | ICD-10-CM | POA: Diagnosis not present

## 2017-11-04 DIAGNOSIS — Z5181 Encounter for therapeutic drug level monitoring: Secondary | ICD-10-CM | POA: Diagnosis not present

## 2017-11-04 DIAGNOSIS — R600 Localized edema: Secondary | ICD-10-CM | POA: Diagnosis not present

## 2017-11-08 DIAGNOSIS — J449 Chronic obstructive pulmonary disease, unspecified: Secondary | ICD-10-CM | POA: Diagnosis not present

## 2017-11-12 DIAGNOSIS — J449 Chronic obstructive pulmonary disease, unspecified: Secondary | ICD-10-CM | POA: Diagnosis not present

## 2017-11-16 DIAGNOSIS — Z7982 Long term (current) use of aspirin: Secondary | ICD-10-CM | POA: Diagnosis not present

## 2017-11-16 DIAGNOSIS — R531 Weakness: Secondary | ICD-10-CM | POA: Diagnosis not present

## 2017-11-16 DIAGNOSIS — S32020A Wedge compression fracture of second lumbar vertebra, initial encounter for closed fracture: Secondary | ICD-10-CM | POA: Diagnosis not present

## 2017-11-16 DIAGNOSIS — M4856XA Collapsed vertebra, not elsewhere classified, lumbar region, initial encounter for fracture: Secondary | ICD-10-CM | POA: Diagnosis not present

## 2017-11-16 DIAGNOSIS — R0602 Shortness of breath: Secondary | ICD-10-CM | POA: Diagnosis not present

## 2017-11-16 DIAGNOSIS — J449 Chronic obstructive pulmonary disease, unspecified: Secondary | ICD-10-CM | POA: Diagnosis not present

## 2017-11-16 DIAGNOSIS — M1990 Unspecified osteoarthritis, unspecified site: Secondary | ICD-10-CM | POA: Diagnosis not present

## 2017-11-16 DIAGNOSIS — R55 Syncope and collapse: Secondary | ICD-10-CM | POA: Diagnosis not present

## 2017-11-16 DIAGNOSIS — I10 Essential (primary) hypertension: Secondary | ICD-10-CM | POA: Diagnosis not present

## 2017-11-16 DIAGNOSIS — E7800 Pure hypercholesterolemia, unspecified: Secondary | ICD-10-CM | POA: Diagnosis not present

## 2017-11-16 DIAGNOSIS — Z79899 Other long term (current) drug therapy: Secondary | ICD-10-CM | POA: Diagnosis not present

## 2017-11-16 DIAGNOSIS — I6782 Cerebral ischemia: Secondary | ICD-10-CM | POA: Diagnosis not present

## 2017-11-16 DIAGNOSIS — M549 Dorsalgia, unspecified: Secondary | ICD-10-CM | POA: Diagnosis not present

## 2017-11-16 DIAGNOSIS — I959 Hypotension, unspecified: Secondary | ICD-10-CM | POA: Diagnosis not present

## 2017-11-17 DIAGNOSIS — Z79899 Other long term (current) drug therapy: Secondary | ICD-10-CM | POA: Diagnosis not present

## 2017-11-17 DIAGNOSIS — R55 Syncope and collapse: Secondary | ICD-10-CM | POA: Diagnosis not present

## 2017-12-09 DIAGNOSIS — J449 Chronic obstructive pulmonary disease, unspecified: Secondary | ICD-10-CM | POA: Diagnosis not present

## 2017-12-09 DIAGNOSIS — R1084 Generalized abdominal pain: Secondary | ICD-10-CM | POA: Diagnosis not present

## 2017-12-09 DIAGNOSIS — R1013 Epigastric pain: Secondary | ICD-10-CM | POA: Diagnosis not present

## 2017-12-09 DIAGNOSIS — J439 Emphysema, unspecified: Secondary | ICD-10-CM | POA: Diagnosis not present

## 2017-12-09 DIAGNOSIS — K2970 Gastritis, unspecified, without bleeding: Secondary | ICD-10-CM | POA: Diagnosis not present

## 2017-12-12 DIAGNOSIS — J449 Chronic obstructive pulmonary disease, unspecified: Secondary | ICD-10-CM | POA: Diagnosis not present

## 2017-12-15 DIAGNOSIS — K7689 Other specified diseases of liver: Secondary | ICD-10-CM | POA: Diagnosis not present

## 2017-12-15 DIAGNOSIS — K2900 Acute gastritis without bleeding: Secondary | ICD-10-CM | POA: Diagnosis not present

## 2017-12-15 DIAGNOSIS — R1909 Other intra-abdominal and pelvic swelling, mass and lump: Secondary | ICD-10-CM | POA: Diagnosis not present

## 2017-12-15 DIAGNOSIS — J018 Other acute sinusitis: Secondary | ICD-10-CM | POA: Diagnosis not present

## 2017-12-15 DIAGNOSIS — E876 Hypokalemia: Secondary | ICD-10-CM | POA: Diagnosis not present

## 2017-12-23 DIAGNOSIS — K769 Liver disease, unspecified: Secondary | ICD-10-CM | POA: Diagnosis not present

## 2017-12-23 DIAGNOSIS — K7689 Other specified diseases of liver: Secondary | ICD-10-CM | POA: Diagnosis not present

## 2017-12-29 DIAGNOSIS — R1900 Intra-abdominal and pelvic swelling, mass and lump, unspecified site: Secondary | ICD-10-CM | POA: Diagnosis not present

## 2018-01-08 DIAGNOSIS — J449 Chronic obstructive pulmonary disease, unspecified: Secondary | ICD-10-CM | POA: Diagnosis not present

## 2018-01-12 DIAGNOSIS — J449 Chronic obstructive pulmonary disease, unspecified: Secondary | ICD-10-CM | POA: Diagnosis not present

## 2018-01-19 DIAGNOSIS — R970 Elevated carcinoembryonic antigen [CEA]: Secondary | ICD-10-CM | POA: Insufficient documentation

## 2018-01-19 DIAGNOSIS — R1900 Intra-abdominal and pelvic swelling, mass and lump, unspecified site: Secondary | ICD-10-CM | POA: Diagnosis not present

## 2018-01-19 HISTORY — DX: Elevated carcinoembryonic antigen (CEA): R97.0

## 2018-02-07 DIAGNOSIS — J441 Chronic obstructive pulmonary disease with (acute) exacerbation: Secondary | ICD-10-CM | POA: Diagnosis not present

## 2018-02-08 DIAGNOSIS — J449 Chronic obstructive pulmonary disease, unspecified: Secondary | ICD-10-CM | POA: Diagnosis not present

## 2018-02-12 DIAGNOSIS — J449 Chronic obstructive pulmonary disease, unspecified: Secondary | ICD-10-CM | POA: Diagnosis not present

## 2018-03-11 DIAGNOSIS — J449 Chronic obstructive pulmonary disease, unspecified: Secondary | ICD-10-CM | POA: Diagnosis not present

## 2018-03-14 DIAGNOSIS — J449 Chronic obstructive pulmonary disease, unspecified: Secondary | ICD-10-CM | POA: Diagnosis not present

## 2018-04-10 DIAGNOSIS — J449 Chronic obstructive pulmonary disease, unspecified: Secondary | ICD-10-CM | POA: Diagnosis not present

## 2018-04-14 DIAGNOSIS — J449 Chronic obstructive pulmonary disease, unspecified: Secondary | ICD-10-CM | POA: Diagnosis not present

## 2018-05-05 DIAGNOSIS — I10 Essential (primary) hypertension: Secondary | ICD-10-CM | POA: Diagnosis not present

## 2018-05-05 DIAGNOSIS — J441 Chronic obstructive pulmonary disease with (acute) exacerbation: Secondary | ICD-10-CM | POA: Diagnosis not present

## 2018-05-05 DIAGNOSIS — J449 Chronic obstructive pulmonary disease, unspecified: Secondary | ICD-10-CM | POA: Diagnosis not present

## 2018-05-05 DIAGNOSIS — E782 Mixed hyperlipidemia: Secondary | ICD-10-CM | POA: Diagnosis not present

## 2018-05-05 DIAGNOSIS — G720 Drug-induced myopathy: Secondary | ICD-10-CM | POA: Diagnosis not present

## 2018-05-11 DIAGNOSIS — J449 Chronic obstructive pulmonary disease, unspecified: Secondary | ICD-10-CM | POA: Diagnosis not present

## 2018-05-14 DIAGNOSIS — J449 Chronic obstructive pulmonary disease, unspecified: Secondary | ICD-10-CM | POA: Diagnosis not present

## 2018-06-10 DIAGNOSIS — J449 Chronic obstructive pulmonary disease, unspecified: Secondary | ICD-10-CM | POA: Diagnosis not present

## 2018-06-14 DIAGNOSIS — J449 Chronic obstructive pulmonary disease, unspecified: Secondary | ICD-10-CM | POA: Diagnosis not present

## 2018-06-15 DIAGNOSIS — S22000A Wedge compression fracture of unspecified thoracic vertebra, initial encounter for closed fracture: Secondary | ICD-10-CM

## 2018-06-15 HISTORY — DX: Wedge compression fracture of unspecified thoracic vertebra, initial encounter for closed fracture: S22.000A

## 2018-07-11 DIAGNOSIS — J449 Chronic obstructive pulmonary disease, unspecified: Secondary | ICD-10-CM | POA: Diagnosis not present

## 2018-07-15 DIAGNOSIS — J449 Chronic obstructive pulmonary disease, unspecified: Secondary | ICD-10-CM | POA: Diagnosis not present

## 2018-08-02 DIAGNOSIS — I10 Essential (primary) hypertension: Secondary | ICD-10-CM | POA: Diagnosis not present

## 2018-08-02 DIAGNOSIS — M542 Cervicalgia: Secondary | ICD-10-CM | POA: Diagnosis not present

## 2018-08-02 DIAGNOSIS — R0902 Hypoxemia: Secondary | ICD-10-CM | POA: Diagnosis not present

## 2018-08-02 DIAGNOSIS — E785 Hyperlipidemia, unspecified: Secondary | ICD-10-CM | POA: Diagnosis not present

## 2018-08-02 DIAGNOSIS — E871 Hypo-osmolality and hyponatremia: Secondary | ICD-10-CM | POA: Diagnosis not present

## 2018-08-02 DIAGNOSIS — J440 Chronic obstructive pulmonary disease with acute lower respiratory infection: Secondary | ICD-10-CM | POA: Diagnosis not present

## 2018-08-02 DIAGNOSIS — Z79899 Other long term (current) drug therapy: Secondary | ICD-10-CM | POA: Diagnosis not present

## 2018-08-02 DIAGNOSIS — R0602 Shortness of breath: Secondary | ICD-10-CM | POA: Diagnosis not present

## 2018-08-02 DIAGNOSIS — I5030 Unspecified diastolic (congestive) heart failure: Secondary | ICD-10-CM | POA: Diagnosis not present

## 2018-08-02 DIAGNOSIS — Z9981 Dependence on supplemental oxygen: Secondary | ICD-10-CM | POA: Diagnosis not present

## 2018-08-02 DIAGNOSIS — R0689 Other abnormalities of breathing: Secondary | ICD-10-CM | POA: Diagnosis not present

## 2018-08-02 DIAGNOSIS — J9601 Acute respiratory failure with hypoxia: Secondary | ICD-10-CM | POA: Diagnosis not present

## 2018-08-02 DIAGNOSIS — R05 Cough: Secondary | ICD-10-CM | POA: Diagnosis not present

## 2018-08-02 DIAGNOSIS — J441 Chronic obstructive pulmonary disease with (acute) exacerbation: Secondary | ICD-10-CM | POA: Diagnosis not present

## 2018-08-02 DIAGNOSIS — R079 Chest pain, unspecified: Secondary | ICD-10-CM | POA: Diagnosis not present

## 2018-08-02 DIAGNOSIS — Z7982 Long term (current) use of aspirin: Secondary | ICD-10-CM | POA: Diagnosis not present

## 2018-08-03 DIAGNOSIS — J9601 Acute respiratory failure with hypoxia: Secondary | ICD-10-CM | POA: Diagnosis not present

## 2018-08-03 DIAGNOSIS — J441 Chronic obstructive pulmonary disease with (acute) exacerbation: Secondary | ICD-10-CM | POA: Diagnosis not present

## 2018-08-10 DIAGNOSIS — J441 Chronic obstructive pulmonary disease with (acute) exacerbation: Secondary | ICD-10-CM | POA: Diagnosis not present

## 2018-08-11 DIAGNOSIS — J449 Chronic obstructive pulmonary disease, unspecified: Secondary | ICD-10-CM | POA: Diagnosis not present

## 2018-08-13 DIAGNOSIS — J449 Chronic obstructive pulmonary disease, unspecified: Secondary | ICD-10-CM | POA: Diagnosis not present

## 2018-09-09 DIAGNOSIS — J449 Chronic obstructive pulmonary disease, unspecified: Secondary | ICD-10-CM | POA: Diagnosis not present

## 2018-09-13 DIAGNOSIS — J449 Chronic obstructive pulmonary disease, unspecified: Secondary | ICD-10-CM | POA: Diagnosis not present

## 2018-10-10 DIAGNOSIS — J449 Chronic obstructive pulmonary disease, unspecified: Secondary | ICD-10-CM | POA: Diagnosis not present

## 2018-10-13 DIAGNOSIS — J449 Chronic obstructive pulmonary disease, unspecified: Secondary | ICD-10-CM | POA: Diagnosis not present

## 2018-11-09 DIAGNOSIS — J449 Chronic obstructive pulmonary disease, unspecified: Secondary | ICD-10-CM | POA: Diagnosis not present

## 2018-11-13 DIAGNOSIS — J449 Chronic obstructive pulmonary disease, unspecified: Secondary | ICD-10-CM | POA: Diagnosis not present

## 2018-11-15 DIAGNOSIS — I10 Essential (primary) hypertension: Secondary | ICD-10-CM | POA: Diagnosis not present

## 2018-11-15 DIAGNOSIS — Z0000 Encounter for general adult medical examination without abnormal findings: Secondary | ICD-10-CM | POA: Diagnosis not present

## 2018-11-15 DIAGNOSIS — Z6824 Body mass index (BMI) 24.0-24.9, adult: Secondary | ICD-10-CM | POA: Diagnosis not present

## 2018-11-15 DIAGNOSIS — E782 Mixed hyperlipidemia: Secondary | ICD-10-CM | POA: Diagnosis not present

## 2018-11-17 DIAGNOSIS — D508 Other iron deficiency anemias: Secondary | ICD-10-CM | POA: Diagnosis not present

## 2018-11-17 DIAGNOSIS — D6489 Other specified anemias: Secondary | ICD-10-CM | POA: Diagnosis not present

## 2018-12-10 DIAGNOSIS — J449 Chronic obstructive pulmonary disease, unspecified: Secondary | ICD-10-CM | POA: Diagnosis not present

## 2018-12-13 DIAGNOSIS — J449 Chronic obstructive pulmonary disease, unspecified: Secondary | ICD-10-CM | POA: Diagnosis not present

## 2018-12-20 DIAGNOSIS — R799 Abnormal finding of blood chemistry, unspecified: Secondary | ICD-10-CM | POA: Diagnosis not present

## 2019-01-09 DIAGNOSIS — J449 Chronic obstructive pulmonary disease, unspecified: Secondary | ICD-10-CM | POA: Diagnosis not present

## 2019-01-13 DIAGNOSIS — J449 Chronic obstructive pulmonary disease, unspecified: Secondary | ICD-10-CM | POA: Diagnosis not present

## 2019-01-26 DIAGNOSIS — R799 Abnormal finding of blood chemistry, unspecified: Secondary | ICD-10-CM | POA: Diagnosis not present

## 2019-02-09 DIAGNOSIS — J449 Chronic obstructive pulmonary disease, unspecified: Secondary | ICD-10-CM | POA: Diagnosis not present

## 2019-02-13 DIAGNOSIS — J449 Chronic obstructive pulmonary disease, unspecified: Secondary | ICD-10-CM | POA: Diagnosis not present

## 2019-03-12 DIAGNOSIS — J449 Chronic obstructive pulmonary disease, unspecified: Secondary | ICD-10-CM | POA: Diagnosis not present

## 2019-03-15 DIAGNOSIS — J449 Chronic obstructive pulmonary disease, unspecified: Secondary | ICD-10-CM | POA: Diagnosis not present

## 2019-04-11 DIAGNOSIS — J449 Chronic obstructive pulmonary disease, unspecified: Secondary | ICD-10-CM | POA: Diagnosis not present

## 2019-04-15 DIAGNOSIS — J449 Chronic obstructive pulmonary disease, unspecified: Secondary | ICD-10-CM | POA: Diagnosis not present

## 2019-05-12 DIAGNOSIS — J449 Chronic obstructive pulmonary disease, unspecified: Secondary | ICD-10-CM | POA: Diagnosis not present

## 2019-05-15 DIAGNOSIS — J449 Chronic obstructive pulmonary disease, unspecified: Secondary | ICD-10-CM | POA: Diagnosis not present

## 2019-05-16 DIAGNOSIS — J189 Pneumonia, unspecified organism: Secondary | ICD-10-CM | POA: Insufficient documentation

## 2019-05-16 DIAGNOSIS — I2699 Other pulmonary embolism without acute cor pulmonale: Secondary | ICD-10-CM

## 2019-05-16 HISTORY — DX: Pneumonia, unspecified organism: J18.9

## 2019-05-16 HISTORY — DX: Other pulmonary embolism without acute cor pulmonale: I26.99

## 2019-05-23 DIAGNOSIS — J018 Other acute sinusitis: Secondary | ICD-10-CM | POA: Diagnosis not present

## 2019-05-23 DIAGNOSIS — K219 Gastro-esophageal reflux disease without esophagitis: Secondary | ICD-10-CM | POA: Diagnosis not present

## 2019-05-23 DIAGNOSIS — I10 Essential (primary) hypertension: Secondary | ICD-10-CM | POA: Diagnosis not present

## 2019-05-23 DIAGNOSIS — E782 Mixed hyperlipidemia: Secondary | ICD-10-CM | POA: Diagnosis not present

## 2019-05-23 DIAGNOSIS — J449 Chronic obstructive pulmonary disease, unspecified: Secondary | ICD-10-CM | POA: Diagnosis not present

## 2019-06-11 DIAGNOSIS — J449 Chronic obstructive pulmonary disease, unspecified: Secondary | ICD-10-CM | POA: Diagnosis not present

## 2019-06-14 DIAGNOSIS — M8088XA Other osteoporosis with current pathological fracture, vertebra(e), initial encounter for fracture: Secondary | ICD-10-CM | POA: Diagnosis not present

## 2019-06-14 DIAGNOSIS — Z743 Need for continuous supervision: Secondary | ICD-10-CM | POA: Diagnosis not present

## 2019-06-14 DIAGNOSIS — R072 Precordial pain: Secondary | ICD-10-CM | POA: Diagnosis not present

## 2019-06-14 DIAGNOSIS — M48061 Spinal stenosis, lumbar region without neurogenic claudication: Secondary | ICD-10-CM | POA: Insufficient documentation

## 2019-06-14 DIAGNOSIS — R1013 Epigastric pain: Secondary | ICD-10-CM | POA: Diagnosis not present

## 2019-06-14 DIAGNOSIS — E871 Hypo-osmolality and hyponatremia: Secondary | ICD-10-CM | POA: Diagnosis not present

## 2019-06-14 DIAGNOSIS — I2699 Other pulmonary embolism without acute cor pulmonale: Secondary | ICD-10-CM | POA: Diagnosis not present

## 2019-06-14 DIAGNOSIS — E876 Hypokalemia: Secondary | ICD-10-CM | POA: Diagnosis not present

## 2019-06-14 DIAGNOSIS — M549 Dorsalgia, unspecified: Secondary | ICD-10-CM | POA: Diagnosis not present

## 2019-06-14 DIAGNOSIS — M5489 Other dorsalgia: Secondary | ICD-10-CM | POA: Diagnosis not present

## 2019-06-14 DIAGNOSIS — J159 Unspecified bacterial pneumonia: Secondary | ICD-10-CM | POA: Diagnosis not present

## 2019-06-14 DIAGNOSIS — Z87891 Personal history of nicotine dependence: Secondary | ICD-10-CM | POA: Diagnosis not present

## 2019-06-14 DIAGNOSIS — R739 Hyperglycemia, unspecified: Secondary | ICD-10-CM | POA: Diagnosis not present

## 2019-06-14 DIAGNOSIS — R9389 Abnormal findings on diagnostic imaging of other specified body structures: Secondary | ICD-10-CM | POA: Diagnosis not present

## 2019-06-14 DIAGNOSIS — E873 Alkalosis: Secondary | ICD-10-CM | POA: Diagnosis not present

## 2019-06-14 DIAGNOSIS — U071 COVID-19: Secondary | ICD-10-CM | POA: Diagnosis not present

## 2019-06-14 DIAGNOSIS — S32040A Wedge compression fracture of fourth lumbar vertebra, initial encounter for closed fracture: Secondary | ICD-10-CM | POA: Diagnosis not present

## 2019-06-14 DIAGNOSIS — J449 Chronic obstructive pulmonary disease, unspecified: Secondary | ICD-10-CM | POA: Diagnosis not present

## 2019-06-14 DIAGNOSIS — D62 Acute posthemorrhagic anemia: Secondary | ICD-10-CM | POA: Diagnosis not present

## 2019-06-14 DIAGNOSIS — K219 Gastro-esophageal reflux disease without esophagitis: Secondary | ICD-10-CM | POA: Diagnosis not present

## 2019-06-14 DIAGNOSIS — S32030A Wedge compression fracture of third lumbar vertebra, initial encounter for closed fracture: Secondary | ICD-10-CM | POA: Diagnosis not present

## 2019-06-14 DIAGNOSIS — Z79899 Other long term (current) drug therapy: Secondary | ICD-10-CM | POA: Diagnosis not present

## 2019-06-14 DIAGNOSIS — K5900 Constipation, unspecified: Secondary | ICD-10-CM | POA: Diagnosis not present

## 2019-06-14 DIAGNOSIS — R509 Fever, unspecified: Secondary | ICD-10-CM | POA: Diagnosis not present

## 2019-06-14 DIAGNOSIS — I10 Essential (primary) hypertension: Secondary | ICD-10-CM | POA: Diagnosis not present

## 2019-06-14 DIAGNOSIS — S32020A Wedge compression fracture of second lumbar vertebra, initial encounter for closed fracture: Secondary | ICD-10-CM | POA: Diagnosis not present

## 2019-06-14 DIAGNOSIS — I480 Paroxysmal atrial fibrillation: Secondary | ICD-10-CM | POA: Diagnosis not present

## 2019-06-14 DIAGNOSIS — I82622 Acute embolism and thrombosis of deep veins of left upper extremity: Secondary | ICD-10-CM | POA: Diagnosis not present

## 2019-06-14 DIAGNOSIS — J9601 Acute respiratory failure with hypoxia: Secondary | ICD-10-CM | POA: Diagnosis not present

## 2019-06-14 HISTORY — DX: Spinal stenosis, lumbar region without neurogenic claudication: M48.061

## 2019-06-15 DIAGNOSIS — M4316 Spondylolisthesis, lumbar region: Secondary | ICD-10-CM | POA: Diagnosis not present

## 2019-06-15 DIAGNOSIS — M8008XA Age-related osteoporosis with current pathological fracture, vertebra(e), initial encounter for fracture: Secondary | ICD-10-CM | POA: Diagnosis not present

## 2019-06-15 DIAGNOSIS — M48062 Spinal stenosis, lumbar region with neurogenic claudication: Secondary | ICD-10-CM | POA: Diagnosis not present

## 2019-06-15 DIAGNOSIS — J449 Chronic obstructive pulmonary disease, unspecified: Secondary | ICD-10-CM | POA: Diagnosis not present

## 2019-06-16 DIAGNOSIS — R739 Hyperglycemia, unspecified: Secondary | ICD-10-CM | POA: Diagnosis not present

## 2019-06-16 DIAGNOSIS — K219 Gastro-esophageal reflux disease without esophagitis: Secondary | ICD-10-CM | POA: Diagnosis not present

## 2019-06-16 DIAGNOSIS — R1013 Epigastric pain: Secondary | ICD-10-CM | POA: Diagnosis not present

## 2019-06-16 DIAGNOSIS — I480 Paroxysmal atrial fibrillation: Secondary | ICD-10-CM | POA: Diagnosis not present

## 2019-06-16 DIAGNOSIS — K5900 Constipation, unspecified: Secondary | ICD-10-CM | POA: Diagnosis not present

## 2019-06-16 DIAGNOSIS — J449 Chronic obstructive pulmonary disease, unspecified: Secondary | ICD-10-CM | POA: Diagnosis not present

## 2019-06-16 DIAGNOSIS — M8088XA Other osteoporosis with current pathological fracture, vertebra(e), initial encounter for fracture: Secondary | ICD-10-CM | POA: Diagnosis not present

## 2019-06-16 DIAGNOSIS — E873 Alkalosis: Secondary | ICD-10-CM | POA: Diagnosis not present

## 2019-06-16 DIAGNOSIS — J9601 Acute respiratory failure with hypoxia: Secondary | ICD-10-CM | POA: Diagnosis not present

## 2019-06-16 DIAGNOSIS — I2699 Other pulmonary embolism without acute cor pulmonale: Secondary | ICD-10-CM | POA: Diagnosis not present

## 2019-06-16 DIAGNOSIS — R072 Precordial pain: Secondary | ICD-10-CM | POA: Diagnosis not present

## 2019-06-16 DIAGNOSIS — R918 Other nonspecific abnormal finding of lung field: Secondary | ICD-10-CM | POA: Diagnosis not present

## 2019-06-16 DIAGNOSIS — U071 COVID-19: Secondary | ICD-10-CM | POA: Diagnosis not present

## 2019-06-16 DIAGNOSIS — R509 Fever, unspecified: Secondary | ICD-10-CM | POA: Diagnosis not present

## 2019-06-16 DIAGNOSIS — J159 Unspecified bacterial pneumonia: Secondary | ICD-10-CM | POA: Diagnosis not present

## 2019-06-16 DIAGNOSIS — D62 Acute posthemorrhagic anemia: Secondary | ICD-10-CM | POA: Diagnosis not present

## 2019-06-16 DIAGNOSIS — Z87891 Personal history of nicotine dependence: Secondary | ICD-10-CM | POA: Diagnosis not present

## 2019-06-16 DIAGNOSIS — I82622 Acute embolism and thrombosis of deep veins of left upper extremity: Secondary | ICD-10-CM | POA: Diagnosis not present

## 2019-06-16 DIAGNOSIS — E871 Hypo-osmolality and hyponatremia: Secondary | ICD-10-CM | POA: Diagnosis not present

## 2019-06-16 DIAGNOSIS — Z79899 Other long term (current) drug therapy: Secondary | ICD-10-CM | POA: Diagnosis not present

## 2019-06-16 DIAGNOSIS — I10 Essential (primary) hypertension: Secondary | ICD-10-CM | POA: Diagnosis not present

## 2019-06-16 DIAGNOSIS — E876 Hypokalemia: Secondary | ICD-10-CM | POA: Diagnosis not present

## 2019-06-16 DIAGNOSIS — J8410 Pulmonary fibrosis, unspecified: Secondary | ICD-10-CM | POA: Diagnosis not present

## 2019-06-16 DIAGNOSIS — J1282 Pneumonia due to coronavirus disease 2019: Secondary | ICD-10-CM | POA: Diagnosis not present

## 2019-06-16 DIAGNOSIS — I2510 Atherosclerotic heart disease of native coronary artery without angina pectoris: Secondary | ICD-10-CM | POA: Diagnosis not present

## 2019-06-16 DIAGNOSIS — M48061 Spinal stenosis, lumbar region without neurogenic claudication: Secondary | ICD-10-CM | POA: Diagnosis not present

## 2019-06-16 DIAGNOSIS — R0602 Shortness of breath: Secondary | ICD-10-CM | POA: Diagnosis not present

## 2019-06-17 DIAGNOSIS — J449 Chronic obstructive pulmonary disease, unspecified: Secondary | ICD-10-CM | POA: Diagnosis not present

## 2019-06-17 DIAGNOSIS — K5900 Constipation, unspecified: Secondary | ICD-10-CM | POA: Diagnosis not present

## 2019-06-17 DIAGNOSIS — E873 Alkalosis: Secondary | ICD-10-CM | POA: Diagnosis not present

## 2019-06-17 DIAGNOSIS — I480 Paroxysmal atrial fibrillation: Secondary | ICD-10-CM | POA: Diagnosis not present

## 2019-06-17 DIAGNOSIS — U071 COVID-19: Secondary | ICD-10-CM | POA: Diagnosis not present

## 2019-06-17 DIAGNOSIS — Z79899 Other long term (current) drug therapy: Secondary | ICD-10-CM | POA: Diagnosis not present

## 2019-06-17 DIAGNOSIS — E876 Hypokalemia: Secondary | ICD-10-CM | POA: Diagnosis not present

## 2019-06-17 DIAGNOSIS — J159 Unspecified bacterial pneumonia: Secondary | ICD-10-CM | POA: Diagnosis not present

## 2019-06-17 DIAGNOSIS — I2699 Other pulmonary embolism without acute cor pulmonale: Secondary | ICD-10-CM | POA: Diagnosis not present

## 2019-06-17 DIAGNOSIS — M8088XA Other osteoporosis with current pathological fracture, vertebra(e), initial encounter for fracture: Secondary | ICD-10-CM | POA: Diagnosis not present

## 2019-06-17 DIAGNOSIS — R509 Fever, unspecified: Secondary | ICD-10-CM | POA: Diagnosis not present

## 2019-06-17 DIAGNOSIS — I82622 Acute embolism and thrombosis of deep veins of left upper extremity: Secondary | ICD-10-CM | POA: Diagnosis not present

## 2019-06-17 DIAGNOSIS — D62 Acute posthemorrhagic anemia: Secondary | ICD-10-CM | POA: Diagnosis not present

## 2019-06-17 DIAGNOSIS — R072 Precordial pain: Secondary | ICD-10-CM | POA: Diagnosis not present

## 2019-06-17 DIAGNOSIS — E871 Hypo-osmolality and hyponatremia: Secondary | ICD-10-CM | POA: Diagnosis not present

## 2019-06-17 DIAGNOSIS — I10 Essential (primary) hypertension: Secondary | ICD-10-CM | POA: Diagnosis not present

## 2019-06-17 DIAGNOSIS — J9601 Acute respiratory failure with hypoxia: Secondary | ICD-10-CM | POA: Diagnosis not present

## 2019-06-17 DIAGNOSIS — R1013 Epigastric pain: Secondary | ICD-10-CM | POA: Diagnosis not present

## 2019-06-17 DIAGNOSIS — R739 Hyperglycemia, unspecified: Secondary | ICD-10-CM | POA: Diagnosis not present

## 2019-06-17 DIAGNOSIS — J1282 Pneumonia due to coronavirus disease 2019: Secondary | ICD-10-CM | POA: Diagnosis not present

## 2019-06-17 DIAGNOSIS — K219 Gastro-esophageal reflux disease without esophagitis: Secondary | ICD-10-CM | POA: Diagnosis not present

## 2019-06-17 DIAGNOSIS — Z87891 Personal history of nicotine dependence: Secondary | ICD-10-CM | POA: Diagnosis not present

## 2019-06-17 DIAGNOSIS — M48061 Spinal stenosis, lumbar region without neurogenic claudication: Secondary | ICD-10-CM | POA: Diagnosis not present

## 2019-06-18 DIAGNOSIS — K5900 Constipation, unspecified: Secondary | ICD-10-CM | POA: Diagnosis not present

## 2019-06-18 DIAGNOSIS — M79602 Pain in left arm: Secondary | ICD-10-CM | POA: Diagnosis not present

## 2019-06-18 DIAGNOSIS — R0602 Shortness of breath: Secondary | ICD-10-CM | POA: Diagnosis not present

## 2019-06-18 DIAGNOSIS — R279 Unspecified lack of coordination: Secondary | ICD-10-CM | POA: Diagnosis not present

## 2019-06-18 DIAGNOSIS — M7989 Other specified soft tissue disorders: Secondary | ICD-10-CM | POA: Diagnosis not present

## 2019-06-18 DIAGNOSIS — R918 Other nonspecific abnormal finding of lung field: Secondary | ICD-10-CM | POA: Diagnosis not present

## 2019-06-18 DIAGNOSIS — M8088XA Other osteoporosis with current pathological fracture, vertebra(e), initial encounter for fracture: Secondary | ICD-10-CM | POA: Diagnosis not present

## 2019-06-18 DIAGNOSIS — I2694 Multiple subsegmental pulmonary emboli without acute cor pulmonale: Secondary | ICD-10-CM | POA: Diagnosis not present

## 2019-06-18 DIAGNOSIS — I480 Paroxysmal atrial fibrillation: Secondary | ICD-10-CM | POA: Diagnosis not present

## 2019-06-18 DIAGNOSIS — R072 Precordial pain: Secondary | ICD-10-CM | POA: Diagnosis not present

## 2019-06-18 DIAGNOSIS — J159 Unspecified bacterial pneumonia: Secondary | ICD-10-CM | POA: Diagnosis not present

## 2019-06-18 DIAGNOSIS — E876 Hypokalemia: Secondary | ICD-10-CM | POA: Diagnosis not present

## 2019-06-18 DIAGNOSIS — R7989 Other specified abnormal findings of blood chemistry: Secondary | ICD-10-CM | POA: Diagnosis not present

## 2019-06-18 DIAGNOSIS — J90 Pleural effusion, not elsewhere classified: Secondary | ICD-10-CM | POA: Diagnosis not present

## 2019-06-18 DIAGNOSIS — R404 Transient alteration of awareness: Secondary | ICD-10-CM | POA: Diagnosis not present

## 2019-06-18 DIAGNOSIS — M5489 Other dorsalgia: Secondary | ICD-10-CM | POA: Diagnosis not present

## 2019-06-18 DIAGNOSIS — S32040A Wedge compression fracture of fourth lumbar vertebra, initial encounter for closed fracture: Secondary | ICD-10-CM | POA: Diagnosis not present

## 2019-06-18 DIAGNOSIS — I2699 Other pulmonary embolism without acute cor pulmonale: Secondary | ICD-10-CM | POA: Diagnosis not present

## 2019-06-18 DIAGNOSIS — M8008XA Age-related osteoporosis with current pathological fracture, vertebra(e), initial encounter for fracture: Secondary | ICD-10-CM | POA: Diagnosis not present

## 2019-06-18 DIAGNOSIS — I519 Heart disease, unspecified: Secondary | ICD-10-CM | POA: Diagnosis not present

## 2019-06-18 DIAGNOSIS — I10 Essential (primary) hypertension: Secondary | ICD-10-CM | POA: Diagnosis not present

## 2019-06-18 DIAGNOSIS — J1282 Pneumonia due to coronavirus disease 2019: Secondary | ICD-10-CM | POA: Diagnosis not present

## 2019-06-18 DIAGNOSIS — J9601 Acute respiratory failure with hypoxia: Secondary | ICD-10-CM | POA: Diagnosis not present

## 2019-06-18 DIAGNOSIS — Z87891 Personal history of nicotine dependence: Secondary | ICD-10-CM | POA: Diagnosis not present

## 2019-06-18 DIAGNOSIS — S32000D Wedge compression fracture of unspecified lumbar vertebra, subsequent encounter for fracture with routine healing: Secondary | ICD-10-CM | POA: Diagnosis not present

## 2019-06-18 DIAGNOSIS — Z743 Need for continuous supervision: Secondary | ICD-10-CM | POA: Diagnosis not present

## 2019-06-18 DIAGNOSIS — I959 Hypotension, unspecified: Secondary | ICD-10-CM | POA: Diagnosis not present

## 2019-06-18 DIAGNOSIS — K219 Gastro-esophageal reflux disease without esophagitis: Secondary | ICD-10-CM | POA: Diagnosis not present

## 2019-06-18 DIAGNOSIS — Z9889 Other specified postprocedural states: Secondary | ICD-10-CM | POA: Diagnosis not present

## 2019-06-18 DIAGNOSIS — M25532 Pain in left wrist: Secondary | ICD-10-CM | POA: Diagnosis not present

## 2019-06-18 DIAGNOSIS — Z4789 Encounter for other orthopedic aftercare: Secondary | ICD-10-CM | POA: Diagnosis not present

## 2019-06-18 DIAGNOSIS — U071 COVID-19: Secondary | ICD-10-CM | POA: Diagnosis not present

## 2019-06-18 DIAGNOSIS — Z79899 Other long term (current) drug therapy: Secondary | ICD-10-CM | POA: Diagnosis not present

## 2019-06-18 DIAGNOSIS — M48061 Spinal stenosis, lumbar region without neurogenic claudication: Secondary | ICD-10-CM | POA: Diagnosis not present

## 2019-06-18 DIAGNOSIS — J449 Chronic obstructive pulmonary disease, unspecified: Secondary | ICD-10-CM | POA: Diagnosis not present

## 2019-06-18 DIAGNOSIS — R509 Fever, unspecified: Secondary | ICD-10-CM | POA: Diagnosis not present

## 2019-06-18 DIAGNOSIS — M4316 Spondylolisthesis, lumbar region: Secondary | ICD-10-CM | POA: Diagnosis not present

## 2019-06-18 DIAGNOSIS — R739 Hyperglycemia, unspecified: Secondary | ICD-10-CM | POA: Diagnosis not present

## 2019-06-18 DIAGNOSIS — M48062 Spinal stenosis, lumbar region with neurogenic claudication: Secondary | ICD-10-CM | POA: Diagnosis not present

## 2019-06-18 DIAGNOSIS — D62 Acute posthemorrhagic anemia: Secondary | ICD-10-CM | POA: Diagnosis not present

## 2019-06-18 DIAGNOSIS — I82622 Acute embolism and thrombosis of deep veins of left upper extremity: Secondary | ICD-10-CM | POA: Diagnosis not present

## 2019-06-18 DIAGNOSIS — E873 Alkalosis: Secondary | ICD-10-CM | POA: Diagnosis not present

## 2019-06-18 DIAGNOSIS — R1013 Epigastric pain: Secondary | ICD-10-CM | POA: Diagnosis not present

## 2019-06-18 DIAGNOSIS — I358 Other nonrheumatic aortic valve disorders: Secondary | ICD-10-CM | POA: Diagnosis not present

## 2019-06-18 DIAGNOSIS — R079 Chest pain, unspecified: Secondary | ICD-10-CM | POA: Diagnosis not present

## 2019-06-18 DIAGNOSIS — E871 Hypo-osmolality and hyponatremia: Secondary | ICD-10-CM | POA: Diagnosis not present

## 2019-06-20 DIAGNOSIS — R7989 Other specified abnormal findings of blood chemistry: Secondary | ICD-10-CM | POA: Diagnosis not present

## 2019-06-20 DIAGNOSIS — R072 Precordial pain: Secondary | ICD-10-CM | POA: Insufficient documentation

## 2019-06-20 DIAGNOSIS — J449 Chronic obstructive pulmonary disease, unspecified: Secondary | ICD-10-CM | POA: Diagnosis not present

## 2019-06-20 DIAGNOSIS — I2694 Multiple subsegmental pulmonary emboli without acute cor pulmonale: Secondary | ICD-10-CM | POA: Diagnosis not present

## 2019-06-20 DIAGNOSIS — R079 Chest pain, unspecified: Secondary | ICD-10-CM | POA: Diagnosis not present

## 2019-06-20 DIAGNOSIS — M48061 Spinal stenosis, lumbar region without neurogenic claudication: Secondary | ICD-10-CM | POA: Diagnosis not present

## 2019-06-20 DIAGNOSIS — I480 Paroxysmal atrial fibrillation: Secondary | ICD-10-CM | POA: Insufficient documentation

## 2019-06-20 DIAGNOSIS — I10 Essential (primary) hypertension: Secondary | ICD-10-CM | POA: Diagnosis not present

## 2019-06-20 HISTORY — DX: Paroxysmal atrial fibrillation: I48.0

## 2019-06-20 HISTORY — DX: Precordial pain: R07.2

## 2019-06-21 DIAGNOSIS — I959 Hypotension, unspecified: Secondary | ICD-10-CM | POA: Diagnosis not present

## 2019-06-21 DIAGNOSIS — I2699 Other pulmonary embolism without acute cor pulmonale: Secondary | ICD-10-CM

## 2019-06-21 DIAGNOSIS — M48061 Spinal stenosis, lumbar region without neurogenic claudication: Secondary | ICD-10-CM | POA: Diagnosis not present

## 2019-06-21 DIAGNOSIS — R7989 Other specified abnormal findings of blood chemistry: Secondary | ICD-10-CM | POA: Diagnosis not present

## 2019-06-21 DIAGNOSIS — I480 Paroxysmal atrial fibrillation: Secondary | ICD-10-CM | POA: Diagnosis not present

## 2019-06-21 DIAGNOSIS — I2694 Multiple subsegmental pulmonary emboli without acute cor pulmonale: Secondary | ICD-10-CM | POA: Diagnosis not present

## 2019-06-21 HISTORY — DX: Other pulmonary embolism without acute cor pulmonale: I26.99

## 2019-06-22 DIAGNOSIS — I519 Heart disease, unspecified: Secondary | ICD-10-CM | POA: Diagnosis not present

## 2019-06-22 DIAGNOSIS — D62 Acute posthemorrhagic anemia: Secondary | ICD-10-CM

## 2019-06-22 DIAGNOSIS — E559 Vitamin D deficiency, unspecified: Secondary | ICD-10-CM | POA: Insufficient documentation

## 2019-06-22 DIAGNOSIS — M48061 Spinal stenosis, lumbar region without neurogenic claudication: Secondary | ICD-10-CM | POA: Diagnosis not present

## 2019-06-22 DIAGNOSIS — J449 Chronic obstructive pulmonary disease, unspecified: Secondary | ICD-10-CM | POA: Diagnosis not present

## 2019-06-22 DIAGNOSIS — I2699 Other pulmonary embolism without acute cor pulmonale: Secondary | ICD-10-CM | POA: Diagnosis not present

## 2019-06-22 DIAGNOSIS — I959 Hypotension, unspecified: Secondary | ICD-10-CM | POA: Diagnosis not present

## 2019-06-22 DIAGNOSIS — I358 Other nonrheumatic aortic valve disorders: Secondary | ICD-10-CM | POA: Diagnosis not present

## 2019-06-22 HISTORY — DX: Vitamin D deficiency, unspecified: E55.9

## 2019-06-22 HISTORY — DX: Acute posthemorrhagic anemia: D62

## 2019-06-23 DIAGNOSIS — D62 Acute posthemorrhagic anemia: Secondary | ICD-10-CM | POA: Diagnosis not present

## 2019-06-23 DIAGNOSIS — I2699 Other pulmonary embolism without acute cor pulmonale: Secondary | ICD-10-CM | POA: Diagnosis not present

## 2019-06-23 DIAGNOSIS — I959 Hypotension, unspecified: Secondary | ICD-10-CM | POA: Diagnosis not present

## 2019-06-23 DIAGNOSIS — M48061 Spinal stenosis, lumbar region without neurogenic claudication: Secondary | ICD-10-CM | POA: Diagnosis not present

## 2019-06-23 DIAGNOSIS — J449 Chronic obstructive pulmonary disease, unspecified: Secondary | ICD-10-CM | POA: Diagnosis not present

## 2019-06-24 DIAGNOSIS — I2699 Other pulmonary embolism without acute cor pulmonale: Secondary | ICD-10-CM | POA: Diagnosis not present

## 2019-06-24 DIAGNOSIS — M48061 Spinal stenosis, lumbar region without neurogenic claudication: Secondary | ICD-10-CM | POA: Diagnosis not present

## 2019-06-24 DIAGNOSIS — M7989 Other specified soft tissue disorders: Secondary | ICD-10-CM | POA: Diagnosis not present

## 2019-06-24 DIAGNOSIS — D62 Acute posthemorrhagic anemia: Secondary | ICD-10-CM | POA: Diagnosis not present

## 2019-06-24 DIAGNOSIS — J449 Chronic obstructive pulmonary disease, unspecified: Secondary | ICD-10-CM | POA: Diagnosis not present

## 2019-06-24 DIAGNOSIS — M25532 Pain in left wrist: Secondary | ICD-10-CM | POA: Diagnosis not present

## 2019-06-24 DIAGNOSIS — I959 Hypotension, unspecified: Secondary | ICD-10-CM | POA: Diagnosis not present

## 2019-06-25 DIAGNOSIS — M48061 Spinal stenosis, lumbar region without neurogenic claudication: Secondary | ICD-10-CM | POA: Diagnosis not present

## 2019-06-25 DIAGNOSIS — M79602 Pain in left arm: Secondary | ICD-10-CM | POA: Diagnosis not present

## 2019-06-25 DIAGNOSIS — I2699 Other pulmonary embolism without acute cor pulmonale: Secondary | ICD-10-CM | POA: Diagnosis not present

## 2019-06-25 DIAGNOSIS — J449 Chronic obstructive pulmonary disease, unspecified: Secondary | ICD-10-CM | POA: Diagnosis not present

## 2019-06-25 DIAGNOSIS — I959 Hypotension, unspecified: Secondary | ICD-10-CM | POA: Diagnosis not present

## 2019-06-25 DIAGNOSIS — D62 Acute posthemorrhagic anemia: Secondary | ICD-10-CM | POA: Diagnosis not present

## 2019-06-26 DIAGNOSIS — D62 Acute posthemorrhagic anemia: Secondary | ICD-10-CM | POA: Diagnosis not present

## 2019-06-26 DIAGNOSIS — J449 Chronic obstructive pulmonary disease, unspecified: Secondary | ICD-10-CM | POA: Diagnosis not present

## 2019-06-26 DIAGNOSIS — I82622 Acute embolism and thrombosis of deep veins of left upper extremity: Secondary | ICD-10-CM | POA: Insufficient documentation

## 2019-06-26 DIAGNOSIS — M48061 Spinal stenosis, lumbar region without neurogenic claudication: Secondary | ICD-10-CM | POA: Diagnosis not present

## 2019-06-26 DIAGNOSIS — R0602 Shortness of breath: Secondary | ICD-10-CM | POA: Diagnosis not present

## 2019-06-26 DIAGNOSIS — I2699 Other pulmonary embolism without acute cor pulmonale: Secondary | ICD-10-CM | POA: Diagnosis not present

## 2019-06-26 DIAGNOSIS — I959 Hypotension, unspecified: Secondary | ICD-10-CM | POA: Diagnosis not present

## 2019-06-26 DIAGNOSIS — R918 Other nonspecific abnormal finding of lung field: Secondary | ICD-10-CM | POA: Diagnosis not present

## 2019-06-26 HISTORY — DX: Acute embolism and thrombosis of deep veins of left upper extremity: I82.622

## 2019-06-27 DIAGNOSIS — I959 Hypotension, unspecified: Secondary | ICD-10-CM | POA: Diagnosis not present

## 2019-06-27 DIAGNOSIS — J449 Chronic obstructive pulmonary disease, unspecified: Secondary | ICD-10-CM | POA: Diagnosis not present

## 2019-06-27 DIAGNOSIS — I2699 Other pulmonary embolism without acute cor pulmonale: Secondary | ICD-10-CM | POA: Diagnosis not present

## 2019-06-27 DIAGNOSIS — M48061 Spinal stenosis, lumbar region without neurogenic claudication: Secondary | ICD-10-CM | POA: Diagnosis not present

## 2019-06-27 DIAGNOSIS — D62 Acute posthemorrhagic anemia: Secondary | ICD-10-CM | POA: Diagnosis not present

## 2019-06-28 DIAGNOSIS — I2699 Other pulmonary embolism without acute cor pulmonale: Secondary | ICD-10-CM | POA: Diagnosis not present

## 2019-06-28 DIAGNOSIS — R509 Fever, unspecified: Secondary | ICD-10-CM | POA: Diagnosis not present

## 2019-06-28 DIAGNOSIS — U071 COVID-19: Secondary | ICD-10-CM | POA: Diagnosis not present

## 2019-06-28 DIAGNOSIS — D62 Acute posthemorrhagic anemia: Secondary | ICD-10-CM | POA: Diagnosis not present

## 2019-06-28 DIAGNOSIS — R918 Other nonspecific abnormal finding of lung field: Secondary | ICD-10-CM | POA: Diagnosis not present

## 2019-06-28 DIAGNOSIS — J449 Chronic obstructive pulmonary disease, unspecified: Secondary | ICD-10-CM | POA: Diagnosis not present

## 2019-06-28 DIAGNOSIS — M48061 Spinal stenosis, lumbar region without neurogenic claudication: Secondary | ICD-10-CM | POA: Diagnosis not present

## 2019-06-29 DIAGNOSIS — J9601 Acute respiratory failure with hypoxia: Secondary | ICD-10-CM | POA: Diagnosis not present

## 2019-06-29 DIAGNOSIS — U071 COVID-19: Secondary | ICD-10-CM | POA: Diagnosis not present

## 2019-06-29 DIAGNOSIS — S32040A Wedge compression fracture of fourth lumbar vertebra, initial encounter for closed fracture: Secondary | ICD-10-CM | POA: Diagnosis not present

## 2019-06-29 DIAGNOSIS — J1282 Pneumonia due to Coronavirus disease 2019: Secondary | ICD-10-CM | POA: Diagnosis not present

## 2019-06-29 DIAGNOSIS — J449 Chronic obstructive pulmonary disease, unspecified: Secondary | ICD-10-CM | POA: Diagnosis not present

## 2019-06-29 DIAGNOSIS — M48061 Spinal stenosis, lumbar region without neurogenic claudication: Secondary | ICD-10-CM | POA: Diagnosis not present

## 2019-06-29 DIAGNOSIS — I2699 Other pulmonary embolism without acute cor pulmonale: Secondary | ICD-10-CM | POA: Diagnosis not present

## 2019-06-29 DIAGNOSIS — D62 Acute posthemorrhagic anemia: Secondary | ICD-10-CM | POA: Diagnosis not present

## 2019-06-30 DIAGNOSIS — D62 Acute posthemorrhagic anemia: Secondary | ICD-10-CM | POA: Diagnosis not present

## 2019-06-30 DIAGNOSIS — I2699 Other pulmonary embolism without acute cor pulmonale: Secondary | ICD-10-CM | POA: Diagnosis not present

## 2019-06-30 DIAGNOSIS — U071 COVID-19: Secondary | ICD-10-CM | POA: Diagnosis not present

## 2019-06-30 DIAGNOSIS — M48061 Spinal stenosis, lumbar region without neurogenic claudication: Secondary | ICD-10-CM | POA: Diagnosis not present

## 2019-06-30 DIAGNOSIS — J449 Chronic obstructive pulmonary disease, unspecified: Secondary | ICD-10-CM | POA: Diagnosis not present

## 2019-07-01 DIAGNOSIS — I2699 Other pulmonary embolism without acute cor pulmonale: Secondary | ICD-10-CM | POA: Diagnosis not present

## 2019-07-01 DIAGNOSIS — U071 COVID-19: Secondary | ICD-10-CM | POA: Diagnosis not present

## 2019-07-01 DIAGNOSIS — M48061 Spinal stenosis, lumbar region without neurogenic claudication: Secondary | ICD-10-CM | POA: Diagnosis not present

## 2019-07-01 DIAGNOSIS — J449 Chronic obstructive pulmonary disease, unspecified: Secondary | ICD-10-CM | POA: Diagnosis not present

## 2019-07-01 DIAGNOSIS — D62 Acute posthemorrhagic anemia: Secondary | ICD-10-CM | POA: Diagnosis not present

## 2019-07-02 DIAGNOSIS — D62 Acute posthemorrhagic anemia: Secondary | ICD-10-CM | POA: Diagnosis not present

## 2019-07-02 DIAGNOSIS — I2699 Other pulmonary embolism without acute cor pulmonale: Secondary | ICD-10-CM | POA: Diagnosis not present

## 2019-07-02 DIAGNOSIS — U071 COVID-19: Secondary | ICD-10-CM | POA: Diagnosis not present

## 2019-07-02 DIAGNOSIS — M48061 Spinal stenosis, lumbar region without neurogenic claudication: Secondary | ICD-10-CM | POA: Diagnosis not present

## 2019-07-02 DIAGNOSIS — J449 Chronic obstructive pulmonary disease, unspecified: Secondary | ICD-10-CM | POA: Diagnosis not present

## 2019-07-04 DIAGNOSIS — M48061 Spinal stenosis, lumbar region without neurogenic claudication: Secondary | ICD-10-CM | POA: Diagnosis not present

## 2019-07-05 DIAGNOSIS — M48061 Spinal stenosis, lumbar region without neurogenic claudication: Secondary | ICD-10-CM | POA: Diagnosis not present

## 2019-07-06 DIAGNOSIS — M48061 Spinal stenosis, lumbar region without neurogenic claudication: Secondary | ICD-10-CM | POA: Diagnosis not present

## 2019-07-07 DIAGNOSIS — M48061 Spinal stenosis, lumbar region without neurogenic claudication: Secondary | ICD-10-CM | POA: Diagnosis not present

## 2019-07-07 DIAGNOSIS — J189 Pneumonia, unspecified organism: Secondary | ICD-10-CM | POA: Diagnosis not present

## 2019-07-07 DIAGNOSIS — J90 Pleural effusion, not elsewhere classified: Secondary | ICD-10-CM | POA: Diagnosis not present

## 2019-07-08 DIAGNOSIS — I82622 Acute embolism and thrombosis of deep veins of left upper extremity: Secondary | ICD-10-CM

## 2019-07-08 DIAGNOSIS — Z743 Need for continuous supervision: Secondary | ICD-10-CM | POA: Diagnosis not present

## 2019-07-08 DIAGNOSIS — R279 Unspecified lack of coordination: Secondary | ICD-10-CM | POA: Diagnosis not present

## 2019-07-08 DIAGNOSIS — M6281 Muscle weakness (generalized): Secondary | ICD-10-CM

## 2019-07-08 DIAGNOSIS — J449 Chronic obstructive pulmonary disease, unspecified: Secondary | ICD-10-CM | POA: Diagnosis not present

## 2019-07-08 DIAGNOSIS — I480 Paroxysmal atrial fibrillation: Secondary | ICD-10-CM | POA: Diagnosis not present

## 2019-07-08 DIAGNOSIS — M25471 Effusion, right ankle: Secondary | ICD-10-CM | POA: Diagnosis not present

## 2019-07-08 DIAGNOSIS — J9601 Acute respiratory failure with hypoxia: Secondary | ICD-10-CM | POA: Insufficient documentation

## 2019-07-08 DIAGNOSIS — M48061 Spinal stenosis, lumbar region without neurogenic claudication: Secondary | ICD-10-CM | POA: Diagnosis not present

## 2019-07-08 DIAGNOSIS — I10 Essential (primary) hypertension: Secondary | ICD-10-CM

## 2019-07-08 DIAGNOSIS — I2699 Other pulmonary embolism without acute cor pulmonale: Secondary | ICD-10-CM | POA: Diagnosis not present

## 2019-07-08 DIAGNOSIS — S32000D Wedge compression fracture of unspecified lumbar vertebra, subsequent encounter for fracture with routine healing: Secondary | ICD-10-CM | POA: Insufficient documentation

## 2019-07-08 DIAGNOSIS — S32040A Wedge compression fracture of fourth lumbar vertebra, initial encounter for closed fracture: Secondary | ICD-10-CM | POA: Diagnosis not present

## 2019-07-08 DIAGNOSIS — M25475 Effusion, left foot: Secondary | ICD-10-CM | POA: Diagnosis not present

## 2019-07-08 DIAGNOSIS — U071 COVID-19: Secondary | ICD-10-CM | POA: Insufficient documentation

## 2019-07-08 DIAGNOSIS — R2681 Unsteadiness on feet: Secondary | ICD-10-CM

## 2019-07-08 DIAGNOSIS — R404 Transient alteration of awareness: Secondary | ICD-10-CM | POA: Diagnosis not present

## 2019-07-08 DIAGNOSIS — K219 Gastro-esophageal reflux disease without esophagitis: Secondary | ICD-10-CM | POA: Insufficient documentation

## 2019-07-08 DIAGNOSIS — Z9889 Other specified postprocedural states: Secondary | ICD-10-CM | POA: Diagnosis not present

## 2019-07-08 DIAGNOSIS — M5489 Other dorsalgia: Secondary | ICD-10-CM | POA: Diagnosis not present

## 2019-07-08 DIAGNOSIS — I4891 Unspecified atrial fibrillation: Secondary | ICD-10-CM | POA: Diagnosis not present

## 2019-07-08 DIAGNOSIS — D62 Acute posthemorrhagic anemia: Secondary | ICD-10-CM

## 2019-07-08 DIAGNOSIS — Z4789 Encounter for other orthopedic aftercare: Secondary | ICD-10-CM | POA: Insufficient documentation

## 2019-07-08 DIAGNOSIS — M25472 Effusion, left ankle: Secondary | ICD-10-CM | POA: Diagnosis not present

## 2019-07-08 DIAGNOSIS — M25474 Effusion, right foot: Secondary | ICD-10-CM | POA: Diagnosis not present

## 2019-07-08 DIAGNOSIS — J1282 Pneumonia due to coronavirus disease 2019: Secondary | ICD-10-CM | POA: Insufficient documentation

## 2019-07-08 DIAGNOSIS — D649 Anemia, unspecified: Secondary | ICD-10-CM | POA: Diagnosis not present

## 2019-07-08 DIAGNOSIS — Z23 Encounter for immunization: Secondary | ICD-10-CM | POA: Diagnosis not present

## 2019-07-08 HISTORY — DX: Encounter for other orthopedic aftercare: Z47.89

## 2019-07-08 HISTORY — DX: Essential (primary) hypertension: I10

## 2019-07-08 HISTORY — DX: COVID-19: U07.1

## 2019-07-08 HISTORY — DX: Acute embolism and thrombosis of deep veins of left upper extremity: I82.622

## 2019-07-08 HISTORY — DX: Pneumonia due to coronavirus disease 2019: J12.82

## 2019-07-08 HISTORY — DX: Acute posthemorrhagic anemia: D62

## 2019-07-08 HISTORY — DX: Wedge compression fracture of unspecified lumbar vertebra, subsequent encounter for fracture with routine healing: S32.000D

## 2019-07-08 HISTORY — DX: Acute respiratory failure with hypoxia: J96.01

## 2019-07-08 HISTORY — DX: Muscle weakness (generalized): M62.81

## 2019-07-08 HISTORY — DX: Gastro-esophageal reflux disease without esophagitis: K21.9

## 2019-07-08 HISTORY — DX: Unsteadiness on feet: R26.81

## 2019-07-10 DIAGNOSIS — D649 Anemia, unspecified: Secondary | ICD-10-CM | POA: Diagnosis not present

## 2019-07-10 DIAGNOSIS — Z9889 Other specified postprocedural states: Secondary | ICD-10-CM | POA: Diagnosis not present

## 2019-07-10 DIAGNOSIS — M48061 Spinal stenosis, lumbar region without neurogenic claudication: Secondary | ICD-10-CM | POA: Diagnosis not present

## 2019-07-10 DIAGNOSIS — S32040A Wedge compression fracture of fourth lumbar vertebra, initial encounter for closed fracture: Secondary | ICD-10-CM | POA: Diagnosis not present

## 2019-07-10 DIAGNOSIS — I4891 Unspecified atrial fibrillation: Secondary | ICD-10-CM | POA: Diagnosis not present

## 2019-07-10 DIAGNOSIS — I10 Essential (primary) hypertension: Secondary | ICD-10-CM | POA: Diagnosis not present

## 2019-07-11 DIAGNOSIS — Z9889 Other specified postprocedural states: Secondary | ICD-10-CM | POA: Diagnosis not present

## 2019-07-11 DIAGNOSIS — J449 Chronic obstructive pulmonary disease, unspecified: Secondary | ICD-10-CM | POA: Diagnosis not present

## 2019-07-11 DIAGNOSIS — M48061 Spinal stenosis, lumbar region without neurogenic claudication: Secondary | ICD-10-CM | POA: Diagnosis not present

## 2019-07-11 DIAGNOSIS — D72829 Elevated white blood cell count, unspecified: Secondary | ICD-10-CM | POA: Insufficient documentation

## 2019-07-11 DIAGNOSIS — S32040A Wedge compression fracture of fourth lumbar vertebra, initial encounter for closed fracture: Secondary | ICD-10-CM | POA: Diagnosis not present

## 2019-07-11 HISTORY — DX: Elevated white blood cell count, unspecified: D72.829

## 2019-07-12 DIAGNOSIS — J449 Chronic obstructive pulmonary disease, unspecified: Secondary | ICD-10-CM | POA: Diagnosis not present

## 2019-07-13 DIAGNOSIS — I2699 Other pulmonary embolism without acute cor pulmonale: Secondary | ICD-10-CM | POA: Diagnosis not present

## 2019-07-13 DIAGNOSIS — Z9889 Other specified postprocedural states: Secondary | ICD-10-CM | POA: Diagnosis not present

## 2019-07-13 DIAGNOSIS — S32040A Wedge compression fracture of fourth lumbar vertebra, initial encounter for closed fracture: Secondary | ICD-10-CM | POA: Diagnosis not present

## 2019-07-13 DIAGNOSIS — I4891 Unspecified atrial fibrillation: Secondary | ICD-10-CM | POA: Diagnosis not present

## 2019-08-03 DIAGNOSIS — M25475 Effusion, left foot: Secondary | ICD-10-CM

## 2019-08-03 DIAGNOSIS — M25471 Effusion, right ankle: Secondary | ICD-10-CM

## 2019-08-03 DIAGNOSIS — M25472 Effusion, left ankle: Secondary | ICD-10-CM

## 2019-08-03 DIAGNOSIS — M25474 Effusion, right foot: Secondary | ICD-10-CM | POA: Insufficient documentation

## 2019-08-03 HISTORY — DX: Effusion, right foot: M25.474

## 2019-08-03 HISTORY — DX: Effusion, right ankle: M25.471

## 2019-08-03 HISTORY — DX: Effusion, left foot: M25.475

## 2019-08-03 HISTORY — DX: Effusion, left ankle: M25.472

## 2019-08-10 ENCOUNTER — Encounter: Payer: Self-pay | Admitting: Nurse Practitioner

## 2019-08-10 DIAGNOSIS — M48061 Spinal stenosis, lumbar region without neurogenic claudication: Secondary | ICD-10-CM | POA: Diagnosis not present

## 2019-08-10 DIAGNOSIS — Z9889 Other specified postprocedural states: Secondary | ICD-10-CM | POA: Diagnosis not present

## 2019-08-10 DIAGNOSIS — I4891 Unspecified atrial fibrillation: Secondary | ICD-10-CM | POA: Diagnosis not present

## 2019-08-10 DIAGNOSIS — E878 Other disorders of electrolyte and fluid balance, not elsewhere classified: Secondary | ICD-10-CM | POA: Insufficient documentation

## 2019-08-10 DIAGNOSIS — S32040A Wedge compression fracture of fourth lumbar vertebra, initial encounter for closed fracture: Secondary | ICD-10-CM | POA: Diagnosis not present

## 2019-08-10 HISTORY — DX: Other disorders of electrolyte and fluid balance, not elsewhere classified: E87.8

## 2019-08-12 DIAGNOSIS — J449 Chronic obstructive pulmonary disease, unspecified: Secondary | ICD-10-CM | POA: Diagnosis not present

## 2019-08-13 DIAGNOSIS — J449 Chronic obstructive pulmonary disease, unspecified: Secondary | ICD-10-CM | POA: Diagnosis not present

## 2019-08-15 ENCOUNTER — Encounter: Payer: Self-pay | Admitting: Physician Assistant

## 2019-08-15 DIAGNOSIS — J449 Chronic obstructive pulmonary disease, unspecified: Secondary | ICD-10-CM

## 2019-08-15 DIAGNOSIS — E785 Hyperlipidemia, unspecified: Secondary | ICD-10-CM | POA: Insufficient documentation

## 2019-08-15 DIAGNOSIS — J440 Chronic obstructive pulmonary disease with acute lower respiratory infection: Secondary | ICD-10-CM | POA: Insufficient documentation

## 2019-08-15 DIAGNOSIS — Z8709 Personal history of other diseases of the respiratory system: Secondary | ICD-10-CM | POA: Insufficient documentation

## 2019-08-15 DIAGNOSIS — I10 Essential (primary) hypertension: Secondary | ICD-10-CM | POA: Insufficient documentation

## 2019-08-15 DIAGNOSIS — J209 Acute bronchitis, unspecified: Secondary | ICD-10-CM | POA: Insufficient documentation

## 2019-08-15 DIAGNOSIS — K219 Gastro-esophageal reflux disease without esophagitis: Secondary | ICD-10-CM | POA: Insufficient documentation

## 2019-08-15 HISTORY — DX: Chronic obstructive pulmonary disease with (acute) lower respiratory infection: J44.0

## 2019-08-15 HISTORY — DX: Chronic obstructive pulmonary disease, unspecified: J44.9

## 2019-08-15 HISTORY — DX: Essential (primary) hypertension: I10

## 2019-08-15 HISTORY — DX: Acute bronchitis, unspecified: J20.9

## 2019-08-21 ENCOUNTER — Encounter: Payer: Self-pay | Admitting: Physician Assistant

## 2019-08-21 ENCOUNTER — Other Ambulatory Visit: Payer: Self-pay

## 2019-08-21 ENCOUNTER — Ambulatory Visit (INDEPENDENT_AMBULATORY_CARE_PROVIDER_SITE_OTHER): Payer: Medicare Other | Admitting: Physician Assistant

## 2019-08-21 VITALS — BP 136/74 | HR 107 | Temp 97.5°F | Resp 16

## 2019-08-21 DIAGNOSIS — I480 Paroxysmal atrial fibrillation: Secondary | ICD-10-CM

## 2019-08-21 DIAGNOSIS — I4819 Other persistent atrial fibrillation: Secondary | ICD-10-CM

## 2019-08-21 DIAGNOSIS — R799 Abnormal finding of blood chemistry, unspecified: Secondary | ICD-10-CM | POA: Diagnosis not present

## 2019-08-21 DIAGNOSIS — Z8709 Personal history of other diseases of the respiratory system: Secondary | ICD-10-CM | POA: Diagnosis not present

## 2019-08-21 DIAGNOSIS — R600 Localized edema: Secondary | ICD-10-CM

## 2019-08-21 DIAGNOSIS — K219 Gastro-esophageal reflux disease without esophagitis: Secondary | ICD-10-CM

## 2019-08-21 DIAGNOSIS — I2699 Other pulmonary embolism without acute cor pulmonale: Secondary | ICD-10-CM

## 2019-08-21 MED ORDER — FLUTICASONE PROPIONATE 50 MCG/ACT NA SUSP: 2.0000 | Freq: Every day | NASAL | 6 refills | Status: DC

## 2019-08-22 ENCOUNTER — Telehealth: Payer: Self-pay

## 2019-08-22 ENCOUNTER — Encounter: Payer: Self-pay | Admitting: Physician Assistant

## 2019-08-22 ENCOUNTER — Other Ambulatory Visit: Payer: Self-pay | Admitting: Physician Assistant

## 2019-08-22 DIAGNOSIS — R799 Abnormal finding of blood chemistry, unspecified: Secondary | ICD-10-CM

## 2019-08-22 DIAGNOSIS — I4819 Other persistent atrial fibrillation: Secondary | ICD-10-CM | POA: Insufficient documentation

## 2019-08-22 DIAGNOSIS — D649 Anemia, unspecified: Secondary | ICD-10-CM

## 2019-08-22 DIAGNOSIS — R600 Localized edema: Secondary | ICD-10-CM

## 2019-08-22 HISTORY — DX: Other persistent atrial fibrillation: I48.19

## 2019-08-22 HISTORY — DX: Localized edema: R60.0

## 2019-08-22 HISTORY — DX: Abnormal finding of blood chemistry, unspecified: R79.9

## 2019-08-22 LAB — COMPREHENSIVE METABOLIC PANEL
ALT: 8 IU/L (ref 0–32)
AST: 14 IU/L (ref 0–40)
Albumin/Globulin Ratio: 1.4 (ref 1.2–2.2)
Albumin: 3.4 g/dL — ABNORMAL LOW (ref 3.7–4.7)
Alkaline Phosphatase: 93 IU/L (ref 39–117)
BUN/Creatinine Ratio: 33 — ABNORMAL HIGH (ref 12–28)
BUN: 13 mg/dL (ref 8–27)
Bilirubin Total: <0.2 mg/dL (ref 0.0–1.2)
CO2: 27 mmol/L (ref 20–29)
Calcium: 8.8 mg/dL (ref 8.7–10.3)
Chloride: 99 mmol/L (ref 96–106)
Creatinine, Ser: 0.39 mg/dL — ABNORMAL LOW (ref 0.57–1.00)
GFR calc Af Amer: 117 mL/min/{1.73_m2} (ref >59)
GFR calc non Af Amer: 102 mL/min/{1.73_m2} (ref >59)
Globulin, Total: 2.5 g/dL (ref 1.5–4.5)
Glucose: 108 mg/dL — ABNORMAL HIGH (ref 65–99)
Potassium: 4.6 mmol/L (ref 3.5–5.2)
Sodium: 140 mmol/L (ref 134–144)
Total Protein: 5.9 g/dL — ABNORMAL LOW (ref 6.0–8.5)

## 2019-08-22 LAB — CBC WITH DIFFERENTIAL/PLATELET
Basophils Absolute: 0.1 10*3/uL (ref 0.0–0.2)
Basos: 1 %
EOS (ABSOLUTE): 0.3 10*3/uL (ref 0.0–0.4)
Eos: 3 %
Hematocrit: 28.1 % — ABNORMAL LOW (ref 34.0–46.6)
Hemoglobin: 8.6 g/dL — ABNORMAL LOW (ref 11.1–15.9)
Immature Grans (Abs): 0 10*3/uL (ref 0.0–0.1)
Immature Granulocytes: 0 %
Lymphocytes Absolute: 1 10*3/uL (ref 0.7–3.1)
Lymphs: 10 %
MCH: 27 pg (ref 26.6–33.0)
MCHC: 30.6 g/dL — ABNORMAL LOW (ref 31.5–35.7)
MCV: 88 fL (ref 79–97)
Monocytes Absolute: 1.1 10*3/uL — ABNORMAL HIGH (ref 0.1–0.9)
Monocytes: 12 %
Neutrophils Absolute: 7.1 10*3/uL — ABNORMAL HIGH (ref 1.4–7.0)
Neutrophils: 74 %
Platelets: 338 10*3/uL (ref 150–450)
RBC: 3.19 x10E6/uL — ABNORMAL LOW (ref 3.77–5.28)
RDW: 14.1 % (ref 11.7–15.4)
WBC: 9.6 10*3/uL (ref 3.4–10.8)

## 2019-08-22 MED ORDER — FUROSEMIDE 20 MG PO TABS: ORAL_TABLET | ORAL | 0 refills | Status: DC

## 2019-08-22 NOTE — Telephone Encounter (Signed)
Patient called stating that she needs [name] to call [employer] at [phone] to give the order that she is on the oxygen.  She states that she was given an oxygen tank due to she needed one.  Please advise.

## 2019-08-22 NOTE — Assessment & Plan Note (Signed)
Continue current meds and referral made to cardiology

## 2019-08-22 NOTE — Assessment & Plan Note (Signed)
rx for lasix Keep feet elevated as much as possible

## 2019-08-22 NOTE — Assessment & Plan Note (Signed)
Continue eliquis as directed

## 2019-08-22 NOTE — Assessment & Plan Note (Signed)
labwork pending 

## 2019-08-22 NOTE — Progress Notes (Signed)
Established Patient Office Visit  Subjective:  Patient ID: Tina Jimenez, female    DOB: March 07, 1941  Age: 79 y.o. MRN: [id number]  CC:  Chief Complaint  Patient presents with  . COPD    HPI EMONII CLEMETSON presents for follow up COPD and hypoxia and new onset of afib with history of pulmonary emboli  Pt with longstanding history of COPD -  She used to be on 2L oxygen and now after being admitted to hospital for back surgery then subsequently acquiring COVID , new onset of atrial fibrillation and pulmonary emboli she was increased to 5L of oxygen.  She has been out of the hospital for approximately 5 weeks and oxygen has not been adjusted.  She was in nursing home for the past 4 weeks States that she feels like she is getting too much oxygen and nasal passages dry  Pt had new onset of afib while in hospital - she has never seen cardiologist . Is currently taking coreg 6.25mg  bid and on Eliquis 5mg  bid.  Recommend referral. She denies chest pain or shortness of breath - is having some swelling of her lower extremities which improves when elevating feet  Pt states that she would like an order for home health - she was supposed to get this done while leaving the nursing home to help with her stability , gait and physical therapy but that did not get scheduled last week when she was discharged  Past Medical History:  Diagnosis Date  . Atrial fibrillation ([place])   . GERD (gastroesophageal reflux disease)   . Hyperlipemia     Past Surgical History:  Procedure Laterality Date  . APPENDECTOMY    . KYPHOPLASTY      Family History  Problem Relation Age of Onset  . Breast cancer Sister   . Lung cancer Sister   . Lung cancer Daughter     Social History   Socioeconomic History  . Marital status: Widowed    Spouse name: Not on file  . Number of children: 4  . Years of education: Not on file  . Highest education level: Not on file  Occupational History  . Occupation:  retired  Tobacco Use  . Smoking status: Current Every Day Smoker  . Smokeless tobacco: Never Used  Substance and Sexual Activity  . Alcohol use: Never  . Drug use: Never  . Sexual activity: Not on file  Other Topics Concern  . Not on file  Social History Narrative  . Not on file   Social Determinants of Health   Financial Resource Strain:   . Difficulty of Paying Living Expenses: Not on file  Food Insecurity:   . Worried About Charity fundraiser in the Last Year: Not on file  . Ran Out of Food in the Last Year: Not on file  Transportation Needs:   . Lack of Transportation (Medical): Not on file  . Lack of Transportation (Non-Medical): Not on file  Physical Activity:   . Days of Exercise per Week: Not on file  . Minutes of Exercise per Session: Not on file  Stress:   . Feeling of Stress : Not on file  Social Connections:   . Frequency of Communication with Friends and Family: Not on file  . Frequency of Social Gatherings with Friends and Family: Not on file  . Attends Religious Services: Not on file  . Active Member of Clubs or Organizations: Not on file  . Attends Archivist Meetings:  Not on file  . Marital Status: Not on file  Intimate Partner Violence:   . Fear of Current or Ex-Partner: Not on file  . Emotionally Abused: Not on file  . Physically Abused: Not on file  . Sexually Abused: Not on file     Current Outpatient Medications:  .  albuterol (VENTOLIN HFA) 108 (90 Base) MCG/ACT inhaler, Inhale into the lungs., Disp: , Rfl:  .  apixaban (ELIQUIS) 5 MG TABS tablet, Take by mouth., Disp: , Rfl:  .  ezetimibe (ZETIA) 10 MG tablet, Take by mouth., Disp: , Rfl:  .  ipratropium-albuterol (DUONEB) 0.5-2.5 (3) MG/3ML SOLN, Take 3 mLs by nebulization 4 (four) times daily., Disp: , Rfl:  .  carvedilol (COREG) 6.25 MG tablet, Take 6.25 mg by mouth 2 (two) times daily., Disp: , Rfl:  .  co-enzyme Q-10 30 MG capsule, Take by mouth., Disp: , Rfl:  .  famotidine  (PEPCID) 40 MG tablet, Take 40 mg by mouth at bedtime., Disp: , Rfl:  .  fluticasone (FLONASE) 50 MCG/ACT nasal spray, Place 2 sprays into both nostrils daily., Disp: 16 g, Rfl: 6 .  fluticasone furoate-vilanterol (BREO ELLIPTA) 100-25 MCG/INH AEPB, Inhale into the lungs., Disp: , Rfl:  .  furosemide (LASIX) 20 MG tablet, 1/2 - 1 tablet daily as needed for edema, Disp: 30 tablet, Rfl: 0 .  loratadine (CLARITIN) 10 MG tablet, Take by mouth., Disp: , Rfl:  .  omeprazole (PRILOSEC) 20 MG capsule, Take by mouth., Disp: , Rfl:    Allergies  Allergen Reactions  . Klonopin [Clonazepam]   . Lipitor [Atorvastatin]   . Simvastatin     ROS CONSTITUTIONAL: Negative for chills, fatigue, fever, unintentional weight gain and unintentional weight loss.  E/N/T: Negative for ear pain, nasal congestion and sore throat.  CARDIOVASCULAR: Negative for chest pain, dizziness, palpitations but does have pedal edema  RESPIRATORY: Negative for recent cough and dyspnea.  GASTROINTESTINAL: Negative for abdominal pain, acid reflux symptoms, constipation, diarrhea, nausea and vomiting.  MSK: Negative for arthralgias and myalgias.  INTEGUMENTARY: Negative for rash.  NEUROLOGICAL: Negative for dizziness and headaches.  PSYCHIATRIC: Negative for sleep disturbance and to question depression screen.  Negative for depression, negative for anhedonia.        Objective:    PHYSICAL EXAM:   VS: BP 136/74   Pulse (!) 107   Temp (!) 97.5 F (36.4 C)   Resp 16   SpO2 97% Comment: 5 liters oxygen  GEN: Well nourished, well developed, in no acute distress - sitting in wheelchair  Cardiac: RRR; no murmurs, rubs, or gallops,1+ edema bilateral feet- no significant varicosities Respiratory:  normal respiratory rate and pattern with no distress - normal breath sounds with no rales, rhonchi, wheezes or rubs  MS: no deformity or atrophy  Skin: warm and dry, no rash  Neuro:  Alert and Oriented x 3, Strength and sensation  are intact - CN II-Xii grossly intact Psych: euthymic mood, appropriate affect and demeanor  BP 136/74   Pulse (!) 107   Temp (!) 97.5 F (36.4 C)   Resp 16   SpO2 97% Comment: 5 liters oxygen Wt Readings from Last 3 Encounters:  No data found for [facility]     Health Maintenance Due  Topic Date Due  . TETANUS/TDAP  08/08/1961  . DEXA SCAN  08/09/2007  . PNA vac Low Risk Adult (1 of 2 - PCV13) 08/09/2007  . INFLUENZA VACCINE  01/14/2019    There are no preventive  care reminders to display for this patient.  No results found for: TSH Lab Results  Component Value Date   WBC 9.6 08/21/2019   HGB 8.6 (L) 08/21/2019   HCT 28.1 (L) 08/21/2019   MCV 88 08/21/2019   PLT 338 08/21/2019   Lab Results  Component Value Date   NA 140 08/21/2019   K 4.6 08/21/2019   CO2 27 08/21/2019   GLUCOSE 108 (H) 08/21/2019   BUN 13 08/21/2019   CREATININE 0.39 (L) 08/21/2019   BILITOT <0.2 08/21/2019   ALKPHOS 93 08/21/2019   AST 14 08/21/2019   ALT 8 08/21/2019   PROT 5.9 (L) 08/21/2019   ALBUMIN 3.4 (L) 08/21/2019   CALCIUM 8.8 08/21/2019   No results found for: CHOL No results found for: HDL No results found for: LDLCALC No results found for: TRIG No results found for: CHOLHDL No results found for: HGBA1C    Assessment & Plan:   Problem List Items Addressed This Visit      Cardiovascular and Mediastinum   Acute pulmonary embolism without acute cor pulmonale (HCC)    Continue eliquis as directed      Relevant Medications   apixaban (ELIQUIS) 5 MG TABS tablet   ezetimibe (ZETIA) 10 MG tablet   carvedilol (COREG) 6.25 MG tablet   furosemide (LASIX) 20 MG tablet   Paroxysmal atrial fibrillation (HCC)    Continue current meds and referral made to cardiology      Relevant Medications   apixaban (ELIQUIS) 5 MG TABS tablet   ezetimibe (ZETIA) 10 MG tablet   carvedilol (COREG) 6.25 MG tablet   furosemide (LASIX) 20 MG tablet                     Other Relevant Orders    Ambulatory referral to [place]   Ambulatory referral to Cardiology     Digestive   RESOLVED: GERD (gastroesophageal reflux disease)   Relevant Medications   famotidine (PEPCID) 40 MG tablet   omeprazole (PRILOSEC) 20 MG capsule     Other   History of COPD - Primary    Continue current meds and decrease oxygen to 4L      Relevant Orders   Ambulatory referral to [place]   Abnormal blood chemistry    labwork pending      Relevant Orders   CBC with Differential/Platelet (Completed)   Comprehensive metabolic panel (Completed)   Localized edema    rx for lasix Keep feet elevated as much as possible         Meds ordered this encounter  Medications  . fluticasone (FLONASE) 50 MCG/ACT nasal spray    Sig: Place 2 sprays into both nostrils daily.    Dispense:  16 g    Refill:  6    Order Specific Question:   Supervising Provider    AnswerRochel Brome [id number]  . furosemide (LASIX) 20 MG tablet    Sig: 1/2 - 1 tablet daily as needed for edema    Dispense:  30 tablet    Refill:  0    Order Specific Question:   Supervising Provider    AnswerShelton Silvas    Follow-up: Return in about 4 weeks (around 09/18/2019).    SARA R Alysia Scism, PA-C

## 2019-08-22 NOTE — Assessment & Plan Note (Signed)
Continue current meds and decrease oxygen to 4L

## 2019-08-22 NOTE — Telephone Encounter (Signed)
Patient told Tina Jimenez and Tina Jimenez that [employer] just delivered a 'stockpile' to her house yesterday and that was the reason she was late. She has had oxygen for quite awhile so no new order needs to be given I don't understand what she exactly is asking

## 2019-08-24 ENCOUNTER — Other Ambulatory Visit: Payer: Medicare Other

## 2019-08-24 ENCOUNTER — Other Ambulatory Visit: Payer: Self-pay

## 2019-08-24 DIAGNOSIS — D649 Anemia, unspecified: Secondary | ICD-10-CM

## 2019-08-25 LAB — CBC WITH DIFFERENTIAL/PLATELET
Basophils Absolute: 0.1 10*3/uL (ref 0.0–0.2)
Basos: 1 %
EOS (ABSOLUTE): 0.2 10*3/uL (ref 0.0–0.4)
Eos: 2 %
Hematocrit: 26.5 % — ABNORMAL LOW (ref 34.0–46.6)
Hemoglobin: 8.2 g/dL — ABNORMAL LOW (ref 11.1–15.9)
Immature Grans (Abs): 0 10*3/uL (ref 0.0–0.1)
Immature Granulocytes: 0 %
Lymphocytes Absolute: 1 10*3/uL (ref 0.7–3.1)
Lymphs: 9 %
MCH: 26.6 pg (ref 26.6–33.0)
MCHC: 30.9 g/dL — ABNORMAL LOW (ref 31.5–35.7)
MCV: 86 fL (ref 79–97)
Monocytes Absolute: 1.3 10*3/uL — ABNORMAL HIGH (ref 0.1–0.9)
Monocytes: 12 %
Neutrophils Absolute: 8.7 10*3/uL — ABNORMAL HIGH (ref 1.4–7.0)
Neutrophils: 76 %
Platelets: 318 10*3/uL (ref 150–450)
RBC: 3.08 x10E6/uL — ABNORMAL LOW (ref 3.77–5.28)
RDW: 14.1 % (ref 11.7–15.4)
WBC: 11.3 10*3/uL — ABNORMAL HIGH (ref 3.4–10.8)

## 2019-08-25 LAB — IRON,TIBC AND FERRITIN PANEL
Ferritin: 115 ng/mL (ref 15–150)
Iron Saturation: 4 % — CL (ref 15–55)
Iron: 10 ug/dL — ABNORMAL LOW (ref 27–139)
Total Iron Binding Capacity: 256 ug/dL (ref 250–450)
UIBC: 246 ug/dL (ref 118–369)

## 2019-08-25 LAB — B12 AND FOLATE PANEL
Folate: 8.1 ng/mL (ref >3.0)
Vitamin B-12: 778 pg/mL (ref 232–1245)

## 2019-08-27 DIAGNOSIS — R339 Retention of urine, unspecified: Secondary | ICD-10-CM | POA: Diagnosis not present

## 2019-08-27 DIAGNOSIS — T17920D Food in respiratory tract, part unspecified causing asphyxiation, subsequent encounter: Secondary | ICD-10-CM | POA: Diagnosis not present

## 2019-08-27 DIAGNOSIS — E785 Hyperlipidemia, unspecified: Secondary | ICD-10-CM | POA: Diagnosis not present

## 2019-08-27 DIAGNOSIS — D509 Iron deficiency anemia, unspecified: Secondary | ICD-10-CM | POA: Diagnosis not present

## 2019-08-27 DIAGNOSIS — I2699 Other pulmonary embolism without acute cor pulmonale: Secondary | ICD-10-CM | POA: Diagnosis not present

## 2019-08-27 DIAGNOSIS — J9611 Chronic respiratory failure with hypoxia: Secondary | ICD-10-CM | POA: Diagnosis not present

## 2019-08-27 DIAGNOSIS — Z7901 Long term (current) use of anticoagulants: Secondary | ICD-10-CM | POA: Diagnosis not present

## 2019-08-27 DIAGNOSIS — J449 Chronic obstructive pulmonary disease, unspecified: Secondary | ICD-10-CM | POA: Diagnosis not present

## 2019-08-27 DIAGNOSIS — I480 Paroxysmal atrial fibrillation: Secondary | ICD-10-CM | POA: Diagnosis not present

## 2019-08-27 DIAGNOSIS — R600 Localized edema: Secondary | ICD-10-CM | POA: Diagnosis not present

## 2019-08-27 DIAGNOSIS — S32020D Wedge compression fracture of second lumbar vertebra, subsequent encounter for fracture with routine healing: Secondary | ICD-10-CM | POA: Diagnosis not present

## 2019-08-27 DIAGNOSIS — J690 Pneumonitis due to inhalation of food and vomit: Secondary | ICD-10-CM | POA: Diagnosis not present

## 2019-08-27 DIAGNOSIS — Z9981 Dependence on supplemental oxygen: Secondary | ICD-10-CM | POA: Diagnosis not present

## 2019-08-27 DIAGNOSIS — Z9181 History of falling: Secondary | ICD-10-CM | POA: Diagnosis not present

## 2019-08-27 DIAGNOSIS — Z8616 Personal history of COVID-19: Secondary | ICD-10-CM | POA: Diagnosis not present

## 2019-08-27 DIAGNOSIS — K219 Gastro-esophageal reflux disease without esophagitis: Secondary | ICD-10-CM | POA: Diagnosis not present

## 2019-08-27 DIAGNOSIS — Z86711 Personal history of pulmonary embolism: Secondary | ICD-10-CM | POA: Diagnosis not present

## 2019-08-31 ENCOUNTER — Telehealth: Payer: Self-pay

## 2019-08-31 DIAGNOSIS — Z9981 Dependence on supplemental oxygen: Secondary | ICD-10-CM | POA: Diagnosis not present

## 2019-08-31 DIAGNOSIS — Z7982 Long term (current) use of aspirin: Secondary | ICD-10-CM | POA: Diagnosis not present

## 2019-08-31 DIAGNOSIS — Z86711 Personal history of pulmonary embolism: Secondary | ICD-10-CM | POA: Diagnosis not present

## 2019-08-31 DIAGNOSIS — D509 Iron deficiency anemia, unspecified: Secondary | ICD-10-CM | POA: Diagnosis not present

## 2019-08-31 DIAGNOSIS — J449 Chronic obstructive pulmonary disease, unspecified: Secondary | ICD-10-CM | POA: Diagnosis not present

## 2019-08-31 DIAGNOSIS — Z7902 Long term (current) use of antithrombotics/antiplatelets: Secondary | ICD-10-CM | POA: Diagnosis not present

## 2019-08-31 DIAGNOSIS — S32020A Wedge compression fracture of second lumbar vertebra, initial encounter for closed fracture: Secondary | ICD-10-CM | POA: Diagnosis not present

## 2019-08-31 DIAGNOSIS — Z87891 Personal history of nicotine dependence: Secondary | ICD-10-CM | POA: Diagnosis not present

## 2019-08-31 DIAGNOSIS — R531 Weakness: Secondary | ICD-10-CM | POA: Diagnosis not present

## 2019-08-31 DIAGNOSIS — R0602 Shortness of breath: Secondary | ICD-10-CM | POA: Diagnosis not present

## 2019-08-31 DIAGNOSIS — Z7952 Long term (current) use of systemic steroids: Secondary | ICD-10-CM | POA: Diagnosis not present

## 2019-08-31 DIAGNOSIS — Z79899 Other long term (current) drug therapy: Secondary | ICD-10-CM | POA: Diagnosis not present

## 2019-08-31 DIAGNOSIS — I10 Essential (primary) hypertension: Secondary | ICD-10-CM | POA: Diagnosis not present

## 2019-08-31 DIAGNOSIS — Z8616 Personal history of COVID-19: Secondary | ICD-10-CM | POA: Diagnosis not present

## 2019-08-31 DIAGNOSIS — R339 Retention of urine, unspecified: Secondary | ICD-10-CM | POA: Diagnosis not present

## 2019-08-31 DIAGNOSIS — E785 Hyperlipidemia, unspecified: Secondary | ICD-10-CM | POA: Diagnosis not present

## 2019-08-31 DIAGNOSIS — I361 Nonrheumatic tricuspid (valve) insufficiency: Secondary | ICD-10-CM | POA: Diagnosis not present

## 2019-08-31 DIAGNOSIS — Z66 Do not resuscitate: Secondary | ICD-10-CM | POA: Diagnosis not present

## 2019-08-31 DIAGNOSIS — Z86718 Personal history of other venous thrombosis and embolism: Secondary | ICD-10-CM | POA: Diagnosis not present

## 2019-08-31 DIAGNOSIS — I2699 Other pulmonary embolism without acute cor pulmonale: Secondary | ICD-10-CM | POA: Diagnosis not present

## 2019-08-31 DIAGNOSIS — J9601 Acute respiratory failure with hypoxia: Secondary | ICD-10-CM | POA: Diagnosis not present

## 2019-08-31 DIAGNOSIS — I4891 Unspecified atrial fibrillation: Secondary | ICD-10-CM | POA: Diagnosis not present

## 2019-08-31 DIAGNOSIS — J690 Pneumonitis due to inhalation of food and vomit: Secondary | ICD-10-CM | POA: Diagnosis not present

## 2019-08-31 DIAGNOSIS — K219 Gastro-esophageal reflux disease without esophagitis: Secondary | ICD-10-CM | POA: Diagnosis not present

## 2019-08-31 DIAGNOSIS — D649 Anemia, unspecified: Secondary | ICD-10-CM | POA: Diagnosis not present

## 2019-08-31 DIAGNOSIS — R109 Unspecified abdominal pain: Secondary | ICD-10-CM | POA: Diagnosis not present

## 2019-08-31 DIAGNOSIS — I480 Paroxysmal atrial fibrillation: Secondary | ICD-10-CM | POA: Diagnosis not present

## 2019-08-31 DIAGNOSIS — Z743 Need for continuous supervision: Secondary | ICD-10-CM | POA: Diagnosis not present

## 2019-09-01 ENCOUNTER — Inpatient Hospital Stay
Admission: AD | Admit: 2019-09-01 | Payer: Medicare Other | Source: Other Acute Inpatient Hospital | Admitting: Internal Medicine

## 2019-09-01 DIAGNOSIS — J690 Pneumonitis due to inhalation of food and vomit: Secondary | ICD-10-CM | POA: Diagnosis not present

## 2019-09-01 DIAGNOSIS — E785 Hyperlipidemia, unspecified: Secondary | ICD-10-CM

## 2019-09-01 DIAGNOSIS — J449 Chronic obstructive pulmonary disease, unspecified: Secondary | ICD-10-CM

## 2019-09-01 DIAGNOSIS — J9601 Acute respiratory failure with hypoxia: Secondary | ICD-10-CM | POA: Diagnosis not present

## 2019-09-01 DIAGNOSIS — I2699 Other pulmonary embolism without acute cor pulmonale: Secondary | ICD-10-CM

## 2019-09-01 DIAGNOSIS — I480 Paroxysmal atrial fibrillation: Secondary | ICD-10-CM

## 2019-09-01 DIAGNOSIS — I4891 Unspecified atrial fibrillation: Secondary | ICD-10-CM | POA: Diagnosis not present

## 2019-09-02 DIAGNOSIS — J449 Chronic obstructive pulmonary disease, unspecified: Secondary | ICD-10-CM | POA: Diagnosis not present

## 2019-09-02 DIAGNOSIS — I4891 Unspecified atrial fibrillation: Secondary | ICD-10-CM | POA: Diagnosis not present

## 2019-09-02 DIAGNOSIS — J690 Pneumonitis due to inhalation of food and vomit: Secondary | ICD-10-CM | POA: Diagnosis not present

## 2019-09-02 DIAGNOSIS — J9601 Acute respiratory failure with hypoxia: Secondary | ICD-10-CM | POA: Diagnosis not present

## 2019-09-03 DIAGNOSIS — J449 Chronic obstructive pulmonary disease, unspecified: Secondary | ICD-10-CM | POA: Diagnosis not present

## 2019-09-03 DIAGNOSIS — J690 Pneumonitis due to inhalation of food and vomit: Secondary | ICD-10-CM | POA: Diagnosis not present

## 2019-09-03 DIAGNOSIS — I4891 Unspecified atrial fibrillation: Secondary | ICD-10-CM | POA: Diagnosis not present

## 2019-09-03 DIAGNOSIS — J9601 Acute respiratory failure with hypoxia: Secondary | ICD-10-CM | POA: Diagnosis not present

## 2019-09-04 ENCOUNTER — Telehealth: Payer: Self-pay

## 2019-09-04 NOTE — Telephone Encounter (Signed)
Transition Care Management Follow-up Telephone Call  Tina Jimenez [birth year]  Admit Date: 08/31/19 Discharge Date: 09/03/19 Diagnoses: Aspiration PNA, AFIB, Anemia   2 day post discharge: 09/05/19 7 day post discharge: 09/10/19 14 day post discharge: 09/17/19  Tina Jimenez was discharged from [facility] on 09/03/19 with the diagnoses listed above.  She was contacted today via telephone in regards to transition of care.    -Hemoccult Negative in ED, HGB 8.5, dropped to 7.6 with no obvious source of bleeding, started on heparin, gave 1 unit PRBC transfusion and IV Iron.  Remained stable discharge HBG 9.9 started oral Ferrous Sulfate 325mg  BID -Recommend PCP initiate GI referral -Recommended CBC in 3-4 days, patient wants to wait until next week's appointment   Items Reviewed:  Medications reviewed: yes  Allergies reviewed: yes  Dietary changes reviewed: yes  Referrals reviewed: yes   Functional Questionnaire:    Any patient concerns? no   Confirmed importance and date/time of follow-up visits scheduled yes  Provider Appointment booked with Marge Duncans 09/11/19  Confirmed with patient if condition begins to worsen call PCP or go to the ER.  Patient was given the office number and encouraged to call back with question or concerns.  : yes

## 2019-09-05 DIAGNOSIS — Z8616 Personal history of COVID-19: Secondary | ICD-10-CM | POA: Diagnosis not present

## 2019-09-05 DIAGNOSIS — D509 Iron deficiency anemia, unspecified: Secondary | ICD-10-CM | POA: Diagnosis not present

## 2019-09-05 DIAGNOSIS — J449 Chronic obstructive pulmonary disease, unspecified: Secondary | ICD-10-CM | POA: Diagnosis not present

## 2019-09-05 DIAGNOSIS — J690 Pneumonitis due to inhalation of food and vomit: Secondary | ICD-10-CM | POA: Diagnosis not present

## 2019-09-05 DIAGNOSIS — I2699 Other pulmonary embolism without acute cor pulmonale: Secondary | ICD-10-CM | POA: Diagnosis not present

## 2019-09-05 DIAGNOSIS — J9611 Chronic respiratory failure with hypoxia: Secondary | ICD-10-CM | POA: Diagnosis not present

## 2019-09-05 DIAGNOSIS — R339 Retention of urine, unspecified: Secondary | ICD-10-CM | POA: Diagnosis not present

## 2019-09-05 DIAGNOSIS — R600 Localized edema: Secondary | ICD-10-CM | POA: Diagnosis not present

## 2019-09-05 DIAGNOSIS — K219 Gastro-esophageal reflux disease without esophagitis: Secondary | ICD-10-CM | POA: Diagnosis not present

## 2019-09-05 DIAGNOSIS — Z9981 Dependence on supplemental oxygen: Secondary | ICD-10-CM | POA: Diagnosis not present

## 2019-09-05 DIAGNOSIS — Z86711 Personal history of pulmonary embolism: Secondary | ICD-10-CM | POA: Diagnosis not present

## 2019-09-05 DIAGNOSIS — Z9181 History of falling: Secondary | ICD-10-CM | POA: Diagnosis not present

## 2019-09-05 DIAGNOSIS — S32020D Wedge compression fracture of second lumbar vertebra, subsequent encounter for fracture with routine healing: Secondary | ICD-10-CM | POA: Diagnosis not present

## 2019-09-05 DIAGNOSIS — I480 Paroxysmal atrial fibrillation: Secondary | ICD-10-CM | POA: Diagnosis not present

## 2019-09-05 DIAGNOSIS — T17920D Food in respiratory tract, part unspecified causing asphyxiation, subsequent encounter: Secondary | ICD-10-CM | POA: Diagnosis not present

## 2019-09-05 DIAGNOSIS — Z7901 Long term (current) use of anticoagulants: Secondary | ICD-10-CM | POA: Diagnosis not present

## 2019-09-05 DIAGNOSIS — E785 Hyperlipidemia, unspecified: Secondary | ICD-10-CM | POA: Diagnosis not present

## 2019-09-06 DIAGNOSIS — T17920D Food in respiratory tract, part unspecified causing asphyxiation, subsequent encounter: Secondary | ICD-10-CM | POA: Diagnosis not present

## 2019-09-06 DIAGNOSIS — I480 Paroxysmal atrial fibrillation: Secondary | ICD-10-CM | POA: Diagnosis not present

## 2019-09-06 DIAGNOSIS — Z9981 Dependence on supplemental oxygen: Secondary | ICD-10-CM | POA: Diagnosis not present

## 2019-09-06 DIAGNOSIS — J9611 Chronic respiratory failure with hypoxia: Secondary | ICD-10-CM | POA: Diagnosis not present

## 2019-09-06 DIAGNOSIS — J690 Pneumonitis due to inhalation of food and vomit: Secondary | ICD-10-CM | POA: Diagnosis not present

## 2019-09-06 DIAGNOSIS — R339 Retention of urine, unspecified: Secondary | ICD-10-CM | POA: Diagnosis not present

## 2019-09-06 DIAGNOSIS — R600 Localized edema: Secondary | ICD-10-CM | POA: Diagnosis not present

## 2019-09-06 DIAGNOSIS — D509 Iron deficiency anemia, unspecified: Secondary | ICD-10-CM | POA: Diagnosis not present

## 2019-09-06 DIAGNOSIS — K219 Gastro-esophageal reflux disease without esophagitis: Secondary | ICD-10-CM | POA: Diagnosis not present

## 2019-09-06 DIAGNOSIS — J449 Chronic obstructive pulmonary disease, unspecified: Secondary | ICD-10-CM | POA: Diagnosis not present

## 2019-09-06 DIAGNOSIS — Z9181 History of falling: Secondary | ICD-10-CM | POA: Diagnosis not present

## 2019-09-06 DIAGNOSIS — Z7901 Long term (current) use of anticoagulants: Secondary | ICD-10-CM | POA: Diagnosis not present

## 2019-09-06 DIAGNOSIS — I2699 Other pulmonary embolism without acute cor pulmonale: Secondary | ICD-10-CM | POA: Diagnosis not present

## 2019-09-06 DIAGNOSIS — S32020D Wedge compression fracture of second lumbar vertebra, subsequent encounter for fracture with routine healing: Secondary | ICD-10-CM | POA: Diagnosis not present

## 2019-09-06 DIAGNOSIS — Z86711 Personal history of pulmonary embolism: Secondary | ICD-10-CM | POA: Diagnosis not present

## 2019-09-06 DIAGNOSIS — Z8616 Personal history of COVID-19: Secondary | ICD-10-CM | POA: Diagnosis not present

## 2019-09-06 DIAGNOSIS — E785 Hyperlipidemia, unspecified: Secondary | ICD-10-CM | POA: Diagnosis not present

## 2019-09-08 DIAGNOSIS — Z86711 Personal history of pulmonary embolism: Secondary | ICD-10-CM | POA: Diagnosis not present

## 2019-09-08 DIAGNOSIS — J690 Pneumonitis due to inhalation of food and vomit: Secondary | ICD-10-CM | POA: Diagnosis not present

## 2019-09-08 DIAGNOSIS — I480 Paroxysmal atrial fibrillation: Secondary | ICD-10-CM | POA: Diagnosis not present

## 2019-09-08 DIAGNOSIS — S32020D Wedge compression fracture of second lumbar vertebra, subsequent encounter for fracture with routine healing: Secondary | ICD-10-CM | POA: Diagnosis not present

## 2019-09-08 DIAGNOSIS — I2699 Other pulmonary embolism without acute cor pulmonale: Secondary | ICD-10-CM | POA: Diagnosis not present

## 2019-09-08 DIAGNOSIS — E785 Hyperlipidemia, unspecified: Secondary | ICD-10-CM | POA: Diagnosis not present

## 2019-09-08 DIAGNOSIS — Z9181 History of falling: Secondary | ICD-10-CM | POA: Diagnosis not present

## 2019-09-08 DIAGNOSIS — D509 Iron deficiency anemia, unspecified: Secondary | ICD-10-CM | POA: Diagnosis not present

## 2019-09-08 DIAGNOSIS — R339 Retention of urine, unspecified: Secondary | ICD-10-CM | POA: Diagnosis not present

## 2019-09-08 DIAGNOSIS — J449 Chronic obstructive pulmonary disease, unspecified: Secondary | ICD-10-CM | POA: Diagnosis not present

## 2019-09-08 DIAGNOSIS — R600 Localized edema: Secondary | ICD-10-CM | POA: Diagnosis not present

## 2019-09-08 DIAGNOSIS — Z9981 Dependence on supplemental oxygen: Secondary | ICD-10-CM | POA: Diagnosis not present

## 2019-09-08 DIAGNOSIS — Z7901 Long term (current) use of anticoagulants: Secondary | ICD-10-CM | POA: Diagnosis not present

## 2019-09-08 DIAGNOSIS — K219 Gastro-esophageal reflux disease without esophagitis: Secondary | ICD-10-CM | POA: Diagnosis not present

## 2019-09-08 DIAGNOSIS — T17920D Food in respiratory tract, part unspecified causing asphyxiation, subsequent encounter: Secondary | ICD-10-CM | POA: Diagnosis not present

## 2019-09-08 DIAGNOSIS — Z8616 Personal history of COVID-19: Secondary | ICD-10-CM | POA: Diagnosis not present

## 2019-09-08 DIAGNOSIS — J9611 Chronic respiratory failure with hypoxia: Secondary | ICD-10-CM | POA: Diagnosis not present

## 2019-09-09 DIAGNOSIS — J449 Chronic obstructive pulmonary disease, unspecified: Secondary | ICD-10-CM | POA: Diagnosis not present

## 2019-09-11 ENCOUNTER — Inpatient Hospital Stay: Payer: Medicare Other | Admitting: Physician Assistant

## 2019-09-11 DIAGNOSIS — Z7901 Long term (current) use of anticoagulants: Secondary | ICD-10-CM | POA: Diagnosis not present

## 2019-09-11 DIAGNOSIS — J9611 Chronic respiratory failure with hypoxia: Secondary | ICD-10-CM | POA: Diagnosis not present

## 2019-09-11 DIAGNOSIS — D649 Anemia, unspecified: Secondary | ICD-10-CM | POA: Diagnosis not present

## 2019-09-11 DIAGNOSIS — R339 Retention of urine, unspecified: Secondary | ICD-10-CM | POA: Diagnosis not present

## 2019-09-11 DIAGNOSIS — E785 Hyperlipidemia, unspecified: Secondary | ICD-10-CM | POA: Diagnosis not present

## 2019-09-11 DIAGNOSIS — Z8616 Personal history of COVID-19: Secondary | ICD-10-CM | POA: Diagnosis not present

## 2019-09-11 DIAGNOSIS — R600 Localized edema: Secondary | ICD-10-CM | POA: Diagnosis not present

## 2019-09-11 DIAGNOSIS — D509 Iron deficiency anemia, unspecified: Secondary | ICD-10-CM | POA: Diagnosis not present

## 2019-09-11 DIAGNOSIS — Z9981 Dependence on supplemental oxygen: Secondary | ICD-10-CM | POA: Diagnosis not present

## 2019-09-11 DIAGNOSIS — S32020D Wedge compression fracture of second lumbar vertebra, subsequent encounter for fracture with routine healing: Secondary | ICD-10-CM | POA: Diagnosis not present

## 2019-09-11 DIAGNOSIS — J449 Chronic obstructive pulmonary disease, unspecified: Secondary | ICD-10-CM | POA: Diagnosis not present

## 2019-09-11 DIAGNOSIS — Z9181 History of falling: Secondary | ICD-10-CM | POA: Diagnosis not present

## 2019-09-11 DIAGNOSIS — I2699 Other pulmonary embolism without acute cor pulmonale: Secondary | ICD-10-CM | POA: Diagnosis not present

## 2019-09-11 DIAGNOSIS — Z86711 Personal history of pulmonary embolism: Secondary | ICD-10-CM | POA: Diagnosis not present

## 2019-09-11 DIAGNOSIS — T17920D Food in respiratory tract, part unspecified causing asphyxiation, subsequent encounter: Secondary | ICD-10-CM | POA: Diagnosis not present

## 2019-09-11 DIAGNOSIS — K219 Gastro-esophageal reflux disease without esophagitis: Secondary | ICD-10-CM | POA: Diagnosis not present

## 2019-09-11 DIAGNOSIS — I480 Paroxysmal atrial fibrillation: Secondary | ICD-10-CM | POA: Diagnosis not present

## 2019-09-11 DIAGNOSIS — J690 Pneumonitis due to inhalation of food and vomit: Secondary | ICD-10-CM | POA: Diagnosis not present

## 2019-09-11 LAB — CBC WITH DIFFERENTIAL/PLATELET
Eos: 8.6
Hemoglobin: 10.2
Lymphs Abs: 0.78
MCH: 27.3
MCHC: 31.9
MCV: 86 (ref 76–111)
MPV: 8.9 fL (ref 7.5–11.5)
Neutrophils absolute (GR#): 7.7 10*3/uL (ref 1.7–7.8)
Neutrophils: 79.5
RBC: 3.72 — AB (ref 3.87–5.11)
RDW: 18.3
WBC: 9.7
lymph#: 8.5
platelet count: 237

## 2019-09-12 DIAGNOSIS — I480 Paroxysmal atrial fibrillation: Secondary | ICD-10-CM | POA: Diagnosis not present

## 2019-09-12 DIAGNOSIS — T17920D Food in respiratory tract, part unspecified causing asphyxiation, subsequent encounter: Secondary | ICD-10-CM | POA: Diagnosis not present

## 2019-09-12 DIAGNOSIS — J690 Pneumonitis due to inhalation of food and vomit: Secondary | ICD-10-CM | POA: Diagnosis not present

## 2019-09-12 DIAGNOSIS — K219 Gastro-esophageal reflux disease without esophagitis: Secondary | ICD-10-CM | POA: Diagnosis not present

## 2019-09-12 DIAGNOSIS — Z9981 Dependence on supplemental oxygen: Secondary | ICD-10-CM | POA: Diagnosis not present

## 2019-09-12 DIAGNOSIS — J449 Chronic obstructive pulmonary disease, unspecified: Secondary | ICD-10-CM | POA: Diagnosis not present

## 2019-09-12 DIAGNOSIS — Z7901 Long term (current) use of anticoagulants: Secondary | ICD-10-CM | POA: Diagnosis not present

## 2019-09-12 DIAGNOSIS — Z9181 History of falling: Secondary | ICD-10-CM | POA: Diagnosis not present

## 2019-09-12 DIAGNOSIS — I2699 Other pulmonary embolism without acute cor pulmonale: Secondary | ICD-10-CM | POA: Diagnosis not present

## 2019-09-12 DIAGNOSIS — R600 Localized edema: Secondary | ICD-10-CM | POA: Diagnosis not present

## 2019-09-12 DIAGNOSIS — J9611 Chronic respiratory failure with hypoxia: Secondary | ICD-10-CM | POA: Diagnosis not present

## 2019-09-12 DIAGNOSIS — S32020D Wedge compression fracture of second lumbar vertebra, subsequent encounter for fracture with routine healing: Secondary | ICD-10-CM | POA: Diagnosis not present

## 2019-09-12 DIAGNOSIS — D509 Iron deficiency anemia, unspecified: Secondary | ICD-10-CM | POA: Diagnosis not present

## 2019-09-12 DIAGNOSIS — Z86711 Personal history of pulmonary embolism: Secondary | ICD-10-CM | POA: Diagnosis not present

## 2019-09-12 DIAGNOSIS — E785 Hyperlipidemia, unspecified: Secondary | ICD-10-CM | POA: Diagnosis not present

## 2019-09-12 DIAGNOSIS — Z8616 Personal history of COVID-19: Secondary | ICD-10-CM | POA: Diagnosis not present

## 2019-09-12 DIAGNOSIS — R339 Retention of urine, unspecified: Secondary | ICD-10-CM | POA: Diagnosis not present

## 2019-09-13 DIAGNOSIS — Z86711 Personal history of pulmonary embolism: Secondary | ICD-10-CM | POA: Diagnosis not present

## 2019-09-13 DIAGNOSIS — I2699 Other pulmonary embolism without acute cor pulmonale: Secondary | ICD-10-CM | POA: Diagnosis not present

## 2019-09-13 DIAGNOSIS — E785 Hyperlipidemia, unspecified: Secondary | ICD-10-CM | POA: Diagnosis not present

## 2019-09-13 DIAGNOSIS — J9611 Chronic respiratory failure with hypoxia: Secondary | ICD-10-CM | POA: Diagnosis not present

## 2019-09-13 DIAGNOSIS — R339 Retention of urine, unspecified: Secondary | ICD-10-CM | POA: Diagnosis not present

## 2019-09-13 DIAGNOSIS — S32020D Wedge compression fracture of second lumbar vertebra, subsequent encounter for fracture with routine healing: Secondary | ICD-10-CM | POA: Diagnosis not present

## 2019-09-13 DIAGNOSIS — Z9181 History of falling: Secondary | ICD-10-CM | POA: Diagnosis not present

## 2019-09-13 DIAGNOSIS — I480 Paroxysmal atrial fibrillation: Secondary | ICD-10-CM | POA: Diagnosis not present

## 2019-09-13 DIAGNOSIS — J449 Chronic obstructive pulmonary disease, unspecified: Secondary | ICD-10-CM | POA: Diagnosis not present

## 2019-09-13 DIAGNOSIS — Z7901 Long term (current) use of anticoagulants: Secondary | ICD-10-CM | POA: Diagnosis not present

## 2019-09-13 DIAGNOSIS — T17920D Food in respiratory tract, part unspecified causing asphyxiation, subsequent encounter: Secondary | ICD-10-CM | POA: Diagnosis not present

## 2019-09-13 DIAGNOSIS — Z9981 Dependence on supplemental oxygen: Secondary | ICD-10-CM | POA: Diagnosis not present

## 2019-09-13 DIAGNOSIS — Z8616 Personal history of COVID-19: Secondary | ICD-10-CM | POA: Diagnosis not present

## 2019-09-13 DIAGNOSIS — K219 Gastro-esophageal reflux disease without esophagitis: Secondary | ICD-10-CM | POA: Diagnosis not present

## 2019-09-13 DIAGNOSIS — R600 Localized edema: Secondary | ICD-10-CM | POA: Diagnosis not present

## 2019-09-13 DIAGNOSIS — J690 Pneumonitis due to inhalation of food and vomit: Secondary | ICD-10-CM | POA: Diagnosis not present

## 2019-09-13 DIAGNOSIS — D509 Iron deficiency anemia, unspecified: Secondary | ICD-10-CM | POA: Diagnosis not present

## 2019-09-14 DIAGNOSIS — J690 Pneumonitis due to inhalation of food and vomit: Secondary | ICD-10-CM | POA: Diagnosis not present

## 2019-09-14 DIAGNOSIS — D509 Iron deficiency anemia, unspecified: Secondary | ICD-10-CM | POA: Diagnosis not present

## 2019-09-14 DIAGNOSIS — R339 Retention of urine, unspecified: Secondary | ICD-10-CM | POA: Diagnosis not present

## 2019-09-14 DIAGNOSIS — I480 Paroxysmal atrial fibrillation: Secondary | ICD-10-CM | POA: Diagnosis not present

## 2019-09-14 DIAGNOSIS — S32020D Wedge compression fracture of second lumbar vertebra, subsequent encounter for fracture with routine healing: Secondary | ICD-10-CM | POA: Diagnosis not present

## 2019-09-14 DIAGNOSIS — J9611 Chronic respiratory failure with hypoxia: Secondary | ICD-10-CM | POA: Diagnosis not present

## 2019-09-14 DIAGNOSIS — K219 Gastro-esophageal reflux disease without esophagitis: Secondary | ICD-10-CM | POA: Diagnosis not present

## 2019-09-14 DIAGNOSIS — J449 Chronic obstructive pulmonary disease, unspecified: Secondary | ICD-10-CM | POA: Diagnosis not present

## 2019-09-14 DIAGNOSIS — R600 Localized edema: Secondary | ICD-10-CM | POA: Diagnosis not present

## 2019-09-14 DIAGNOSIS — T17920D Food in respiratory tract, part unspecified causing asphyxiation, subsequent encounter: Secondary | ICD-10-CM | POA: Diagnosis not present

## 2019-09-14 DIAGNOSIS — Z9181 History of falling: Secondary | ICD-10-CM | POA: Diagnosis not present

## 2019-09-14 DIAGNOSIS — Z86711 Personal history of pulmonary embolism: Secondary | ICD-10-CM | POA: Diagnosis not present

## 2019-09-14 DIAGNOSIS — E785 Hyperlipidemia, unspecified: Secondary | ICD-10-CM | POA: Diagnosis not present

## 2019-09-14 DIAGNOSIS — Z7901 Long term (current) use of anticoagulants: Secondary | ICD-10-CM | POA: Diagnosis not present

## 2019-09-14 DIAGNOSIS — Z9981 Dependence on supplemental oxygen: Secondary | ICD-10-CM | POA: Diagnosis not present

## 2019-09-14 DIAGNOSIS — Z8616 Personal history of COVID-19: Secondary | ICD-10-CM | POA: Diagnosis not present

## 2019-09-15 ENCOUNTER — Other Ambulatory Visit: Payer: Self-pay | Admitting: Physician Assistant

## 2019-09-18 DIAGNOSIS — Z86711 Personal history of pulmonary embolism: Secondary | ICD-10-CM | POA: Diagnosis not present

## 2019-09-18 DIAGNOSIS — R600 Localized edema: Secondary | ICD-10-CM | POA: Diagnosis not present

## 2019-09-18 DIAGNOSIS — S32020D Wedge compression fracture of second lumbar vertebra, subsequent encounter for fracture with routine healing: Secondary | ICD-10-CM | POA: Diagnosis not present

## 2019-09-18 DIAGNOSIS — E785 Hyperlipidemia, unspecified: Secondary | ICD-10-CM | POA: Diagnosis not present

## 2019-09-18 DIAGNOSIS — Z8616 Personal history of COVID-19: Secondary | ICD-10-CM | POA: Diagnosis not present

## 2019-09-18 DIAGNOSIS — J9611 Chronic respiratory failure with hypoxia: Secondary | ICD-10-CM | POA: Diagnosis not present

## 2019-09-18 DIAGNOSIS — I480 Paroxysmal atrial fibrillation: Secondary | ICD-10-CM | POA: Diagnosis not present

## 2019-09-18 DIAGNOSIS — Z9181 History of falling: Secondary | ICD-10-CM | POA: Diagnosis not present

## 2019-09-18 DIAGNOSIS — T17920D Food in respiratory tract, part unspecified causing asphyxiation, subsequent encounter: Secondary | ICD-10-CM | POA: Diagnosis not present

## 2019-09-18 DIAGNOSIS — Z7901 Long term (current) use of anticoagulants: Secondary | ICD-10-CM | POA: Diagnosis not present

## 2019-09-18 DIAGNOSIS — J449 Chronic obstructive pulmonary disease, unspecified: Secondary | ICD-10-CM | POA: Diagnosis not present

## 2019-09-18 DIAGNOSIS — D509 Iron deficiency anemia, unspecified: Secondary | ICD-10-CM | POA: Diagnosis not present

## 2019-09-18 DIAGNOSIS — J690 Pneumonitis due to inhalation of food and vomit: Secondary | ICD-10-CM | POA: Diagnosis not present

## 2019-09-18 DIAGNOSIS — Z9981 Dependence on supplemental oxygen: Secondary | ICD-10-CM | POA: Diagnosis not present

## 2019-09-18 DIAGNOSIS — R339 Retention of urine, unspecified: Secondary | ICD-10-CM | POA: Diagnosis not present

## 2019-09-18 DIAGNOSIS — K219 Gastro-esophageal reflux disease without esophagitis: Secondary | ICD-10-CM | POA: Diagnosis not present

## 2019-09-19 ENCOUNTER — Other Ambulatory Visit: Payer: Self-pay | Admitting: Physician Assistant

## 2019-09-20 DIAGNOSIS — J690 Pneumonitis due to inhalation of food and vomit: Secondary | ICD-10-CM | POA: Diagnosis not present

## 2019-09-20 DIAGNOSIS — J9611 Chronic respiratory failure with hypoxia: Secondary | ICD-10-CM | POA: Diagnosis not present

## 2019-09-20 DIAGNOSIS — Z86711 Personal history of pulmonary embolism: Secondary | ICD-10-CM | POA: Diagnosis not present

## 2019-09-20 DIAGNOSIS — E785 Hyperlipidemia, unspecified: Secondary | ICD-10-CM | POA: Diagnosis not present

## 2019-09-20 DIAGNOSIS — S32020D Wedge compression fracture of second lumbar vertebra, subsequent encounter for fracture with routine healing: Secondary | ICD-10-CM | POA: Diagnosis not present

## 2019-09-20 DIAGNOSIS — J449 Chronic obstructive pulmonary disease, unspecified: Secondary | ICD-10-CM | POA: Diagnosis not present

## 2019-09-20 DIAGNOSIS — K219 Gastro-esophageal reflux disease without esophagitis: Secondary | ICD-10-CM | POA: Diagnosis not present

## 2019-09-20 DIAGNOSIS — T17920D Food in respiratory tract, part unspecified causing asphyxiation, subsequent encounter: Secondary | ICD-10-CM | POA: Diagnosis not present

## 2019-09-20 DIAGNOSIS — Z9981 Dependence on supplemental oxygen: Secondary | ICD-10-CM | POA: Diagnosis not present

## 2019-09-20 DIAGNOSIS — R339 Retention of urine, unspecified: Secondary | ICD-10-CM | POA: Diagnosis not present

## 2019-09-20 DIAGNOSIS — Z7901 Long term (current) use of anticoagulants: Secondary | ICD-10-CM | POA: Diagnosis not present

## 2019-09-20 DIAGNOSIS — I480 Paroxysmal atrial fibrillation: Secondary | ICD-10-CM | POA: Diagnosis not present

## 2019-09-20 DIAGNOSIS — D509 Iron deficiency anemia, unspecified: Secondary | ICD-10-CM | POA: Diagnosis not present

## 2019-09-20 DIAGNOSIS — Z9181 History of falling: Secondary | ICD-10-CM | POA: Diagnosis not present

## 2019-09-20 DIAGNOSIS — Z8616 Personal history of COVID-19: Secondary | ICD-10-CM | POA: Diagnosis not present

## 2019-09-20 DIAGNOSIS — R600 Localized edema: Secondary | ICD-10-CM | POA: Diagnosis not present

## 2019-09-21 ENCOUNTER — Encounter: Payer: Self-pay | Admitting: Physician Assistant

## 2019-09-21 ENCOUNTER — Ambulatory Visit (INDEPENDENT_AMBULATORY_CARE_PROVIDER_SITE_OTHER): Payer: Medicare Other | Admitting: Physician Assistant

## 2019-09-21 ENCOUNTER — Other Ambulatory Visit: Payer: Self-pay

## 2019-09-21 VITALS — BP 138/78 | HR 96 | Temp 98.1°F | Ht 63.0 in | Wt 148.0 lb

## 2019-09-21 DIAGNOSIS — D62 Acute posthemorrhagic anemia: Secondary | ICD-10-CM | POA: Diagnosis not present

## 2019-09-21 DIAGNOSIS — R799 Abnormal finding of blood chemistry, unspecified: Secondary | ICD-10-CM

## 2019-09-21 NOTE — Assessment & Plan Note (Signed)
RECOMMEND REFERRAL TO GI BUT PT ADAMANTLY REFUSES labwork pending

## 2019-09-21 NOTE — Assessment & Plan Note (Signed)
labwork pending 

## 2019-09-21 NOTE — Progress Notes (Signed)
Established Patient Office Visit  Subjective:  Patient ID: Tina Jimenez, female    DOB: [birth year]  Age: 79 y.o. MRN: [id number]  CC:  Chief Complaint  Patient presents with  . Hospital follow up    HPI Tina Jimenez presents for follow up COPD and hypoxia and new onset of afib with history of pulmonary emboli  Pt with longstanding history of COPD -  She used to be on 2L oxygen and now after being admitted to hospital for back surgery then subsequently acquiring COVID , new onset of atrial fibrillation and pulmonary emboli she was increased to 5L of oxygen.  She now has weaned herself down to 3L and is doing well - she states her pulse ox ranges 94-97% -  She had this hospital stay from 3/18-3/20  Pt had new onset of afib while in hospital few months ago - she has never seen cardiologist . Is currently taking coreg 6.25mg  bid and on Eliquis 5mg  bid.  Recommend referral however she has not agreed to see cardiology and has not made appt She denies chest pain - does state that since being placed on lasix her swelling has improved  Pt also was found to have anemia -  Per [facility] note she was to be transferred to [facility] for further management but after iron infusion and blood transfusion pt and her son state 'no one ever came and got her' and she was discharged.  She had a repeat CBC 10 days ago which showed hemoglobin to be 10.2 - due to recheck PT ADAMANTLY REFUSES GI EVALUATION !!!!!   Past Medical History:  Diagnosis Date  . Atrial fibrillation ([place])   . GERD (gastroesophageal reflux disease)   . Hyperlipemia     Past Surgical History:  Procedure Laterality Date  . APPENDECTOMY    . KYPHOPLASTY      Family History  Problem Relation Age of Onset  . Breast cancer Sister   . Lung cancer Sister   . Lung cancer Daughter     Social History   Socioeconomic History  . Marital status: Widowed    Spouse name: Not on file  . Number of children: 4    . Years of education: Not on file  . Highest education level: Not on file  Occupational History  . Occupation: retired  Tobacco Use  . Smoking status: Current Every Day Smoker  . Smokeless tobacco: Never Used  Substance and Sexual Activity  . Alcohol use: Never  . Drug use: Never  . Sexual activity: Not on file  Other Topics Concern  . Not on file  Social History Narrative  . Not on file   Social Determinants of Health   Financial Resource Strain:   . Difficulty of Paying Living Expenses:   Food Insecurity:   . Worried About Charity fundraiser in the Last Year:   . Arboriculturist in the Last Year:   Transportation Needs:   . Film/video editor (Medical):   Marland Kitchen Lack of Transportation (Non-Medical):   Physical Activity:   . Days of Exercise per Week:   . Minutes of Exercise per Session:   Stress:   . Feeling of Stress :   Social Connections:   . Frequency of Communication with Friends and Family:   . Frequency of Social Gatherings with Friends and Family:   . Attends Religious Services:   . Active Member of Clubs or Organizations:   . Attends Club  or Organization Meetings:   Marland Kitchen Marital Status:   Intimate Partner Violence:   . Fear of Current or Ex-Partner:   . Emotionally Abused:   Marland Kitchen Physically Abused:   . Sexually Abused:      Current Outpatient Medications:  .  albuterol (VENTOLIN HFA) 108 (90 Base) MCG/ACT inhaler, Inhale into the lungs., Disp: , Rfl:  .  apixaban (ELIQUIS) 5 MG TABS tablet, Take by mouth., Disp: , Rfl:  .  carvedilol (COREG) 6.25 MG tablet, Take 6.25 mg by mouth 2 (two) times daily., Disp: , Rfl:  .  co-enzyme Q-10 30 MG capsule, Take by mouth., Disp: , Rfl:  .  ezetimibe (ZETIA) 10 MG tablet, Take by mouth., Disp: , Rfl:  .  famotidine (PEPCID) 40 MG tablet, Take 40 mg by mouth at bedtime., Disp: , Rfl:  .  ferrous sulfate 325 (65 FE) MG tablet, Take 325 mg by mouth 2 (two) times daily with a meal. , Disp: , Rfl:  .  fluticasone (FLONASE)  50 MCG/ACT nasal spray, Place 2 sprays into both nostrils daily., Disp: 16 g, Rfl: 6 .  fluticasone furoate-vilanterol (BREO ELLIPTA) 100-25 MCG/INH AEPB, Inhale into the lungs., Disp: , Rfl:  .  furosemide (LASIX) 20 MG tablet, TAKE 1/2 - 1 TABLET BY MOUTH DAILY AS NEEDED FOR EDEMA, Disp: 30 tablet, Rfl: 0 .  ipratropium-albuterol (DUONEB) 0.5-2.5 (3) MG/3ML SOLN, Take 3 mLs by nebulization 4 (four) times daily., Disp: , Rfl:  .  loratadine (CLARITIN) 10 MG tablet, TAKE 1 TABLET BY MOUTH ONCE DAILY FOR ALLERGIES, Disp: 90 tablet, Rfl: 1 .  omeprazole (PRILOSEC) 20 MG capsule, Take by mouth., Disp: , Rfl:    Allergies  Allergen Reactions  . Klonopin [Clonazepam]   . Lipitor [Atorvastatin]   . Simvastatin     ROS CONSTITUTIONAL: Negative for chills, fatigue, fever, unintentional weight gain and unintentional weight loss.  E/N/T: Negative for ear pain, nasal congestion and sore throat.  CARDIOVASCULAR: Negative for chest pain, dizziness, palpitations but does have pedal edema and states she has not taken her lasix RESPIRATORY: Negative for recent cough and dyspnea.  GASTROINTESTINAL: Negative for abdominal pain, acid reflux symptoms, constipation, diarrhea, nausea and vomiting.  MSK: Negative for arthralgias and myalgias.  INTEGUMENTARY: Negative for rash.  NEUROLOGICAL: Negative for dizziness and headaches.  PSYCHIATRIC: Negative for sleep disturbance and to question depression screen.  Negative for depression, negative for anhedonia.        Objective:    PHYSICAL EXAM:   VS: BP 138/78 (BP Location: Left Arm, Patient Position: Sitting)   Pulse 96   Temp 98.1 F (36.7 C) (Temporal)   Ht 5\' 3"  (1.6 m)   Wt 148 lb (67.1 kg)   SpO2 100%   BMI 26.22 kg/m   GEN: Well nourished, well developed, in no acute distress - sitting in wheelchair  Cardiac: RRR; no murmurs, rubs, or gallops,1+ edema bilateral feet- no significant varicosities Respiratory:  normal respiratory rate and  pattern with no distress - normal breath sounds with no rales, rhonchi, wheezes or rubs Skin: warm and dry, no rash  Neuro:  Alert and Oriented x 3, Strength and sensation are intact - CN II-Xii grossly intact Psych: euthymic mood, appropriate affect and demeanor  BP 138/78 (BP Location: Left Arm, Patient Position: Sitting)   Pulse 96   Temp 98.1 F (36.7 C) (Temporal)   Ht 5\' 3"  (1.6 m)   Wt 148 lb (67.1 kg)   SpO2 100%   BMI  26.22 kg/m  Wt Readings from Last 3 Encounters:  No data found for Wt     Health Maintenance Due  Topic Date Due  . TETANUS/TDAP  Never done  . DEXA SCAN  Never done  . PNA vac Low Risk Adult (1 of 2 - PCV13) Never done    There are no preventive care reminders to display for this patient.  No results found for: TSH Lab Results  Component Value Date   WBC 11.3 (H) 08/24/2019   HGB 8.2 (L) 08/24/2019   HCT 26.5 (L) 08/24/2019   MCV 86 08/24/2019   PLT 318 08/24/2019   Lab Results  Component Value Date   NA 140 08/21/2019   K 4.6 08/21/2019   CO2 27 08/21/2019   GLUCOSE 108 (H) 08/21/2019   BUN 13 08/21/2019   CREATININE 0.39 (L) 08/21/2019   BILITOT <0.2 08/21/2019   ALKPHOS 93 08/21/2019   AST 14 08/21/2019   ALT 8 08/21/2019   PROT 5.9 (L) 08/21/2019   ALBUMIN 3.4 (L) 08/21/2019   CALCIUM 8.8 08/21/2019   No results found for: CHOL No results found for: HDL No results found for: LDLCALC No results found for: TRIG No results found for: CHOLHDL No results found for: HGBA1C    Assessment & Plan:   Problem List Items Addressed This Visit      Cardiovascular and Mediastinum   Acute pulmonary embolism without acute cor pulmonale (HCC)    Continue eliquis as directed      Relevant Medications   apixaban (ELIQUIS) 5 MG TABS tablet   ezetimibe (ZETIA) 10 MG tablet   carvedilol (COREG) 6.25 MG tablet   furosemide (LASIX) 20 MG tablet   Paroxysmal atrial fibrillation (HCC)    Continue current meds and recommend again referral to  cardiology       Relevant Medications   apixaban (ELIQUIS) 5 MG TABS tablet   ezetimibe (ZETIA) 10 MG tablet   carvedilol (COREG) 6.25 MG tablet   furosemide (LASIX) 20 MG tablet                                                 Other   History of COPD - Primary    Continue current meds and continue oxygen at 3L            Abnormal blood chemistry    labwork pending      Relevant Orders   CBC with Differential/Platelet (Completed)   Comprehensive metabolic panel (Completed)   Localized edema    rx for lasix Keep feet elevated as much as possible         No orders of the defined types were placed in this encounter.   Follow-up: Return in about 1 month (around 10/21/2019) for chronic follow up.    SARA R Olivea Sonnen, PA-C

## 2019-09-22 DIAGNOSIS — T17920D Food in respiratory tract, part unspecified causing asphyxiation, subsequent encounter: Secondary | ICD-10-CM | POA: Diagnosis not present

## 2019-09-22 DIAGNOSIS — Z8616 Personal history of COVID-19: Secondary | ICD-10-CM | POA: Diagnosis not present

## 2019-09-22 DIAGNOSIS — J9611 Chronic respiratory failure with hypoxia: Secondary | ICD-10-CM | POA: Diagnosis not present

## 2019-09-22 DIAGNOSIS — Z9981 Dependence on supplemental oxygen: Secondary | ICD-10-CM | POA: Diagnosis not present

## 2019-09-22 DIAGNOSIS — J690 Pneumonitis due to inhalation of food and vomit: Secondary | ICD-10-CM | POA: Diagnosis not present

## 2019-09-22 DIAGNOSIS — J449 Chronic obstructive pulmonary disease, unspecified: Secondary | ICD-10-CM | POA: Diagnosis not present

## 2019-09-22 DIAGNOSIS — R339 Retention of urine, unspecified: Secondary | ICD-10-CM | POA: Diagnosis not present

## 2019-09-22 DIAGNOSIS — S32020D Wedge compression fracture of second lumbar vertebra, subsequent encounter for fracture with routine healing: Secondary | ICD-10-CM | POA: Diagnosis not present

## 2019-09-22 DIAGNOSIS — I480 Paroxysmal atrial fibrillation: Secondary | ICD-10-CM | POA: Diagnosis not present

## 2019-09-22 DIAGNOSIS — Z7901 Long term (current) use of anticoagulants: Secondary | ICD-10-CM | POA: Diagnosis not present

## 2019-09-22 DIAGNOSIS — Z86711 Personal history of pulmonary embolism: Secondary | ICD-10-CM | POA: Diagnosis not present

## 2019-09-22 DIAGNOSIS — K219 Gastro-esophageal reflux disease without esophagitis: Secondary | ICD-10-CM | POA: Diagnosis not present

## 2019-09-22 DIAGNOSIS — Z9181 History of falling: Secondary | ICD-10-CM | POA: Diagnosis not present

## 2019-09-22 DIAGNOSIS — R600 Localized edema: Secondary | ICD-10-CM | POA: Diagnosis not present

## 2019-09-22 DIAGNOSIS — D509 Iron deficiency anemia, unspecified: Secondary | ICD-10-CM | POA: Diagnosis not present

## 2019-09-22 DIAGNOSIS — E785 Hyperlipidemia, unspecified: Secondary | ICD-10-CM | POA: Diagnosis not present

## 2019-09-25 ENCOUNTER — Telehealth: Payer: Self-pay | Admitting: Cardiology

## 2019-09-25 DIAGNOSIS — J9611 Chronic respiratory failure with hypoxia: Secondary | ICD-10-CM | POA: Diagnosis not present

## 2019-09-25 DIAGNOSIS — S32020D Wedge compression fracture of second lumbar vertebra, subsequent encounter for fracture with routine healing: Secondary | ICD-10-CM | POA: Diagnosis not present

## 2019-09-25 DIAGNOSIS — T17920D Food in respiratory tract, part unspecified causing asphyxiation, subsequent encounter: Secondary | ICD-10-CM | POA: Diagnosis not present

## 2019-09-25 DIAGNOSIS — Z86711 Personal history of pulmonary embolism: Secondary | ICD-10-CM | POA: Diagnosis not present

## 2019-09-25 DIAGNOSIS — E785 Hyperlipidemia, unspecified: Secondary | ICD-10-CM | POA: Diagnosis not present

## 2019-09-25 DIAGNOSIS — J449 Chronic obstructive pulmonary disease, unspecified: Secondary | ICD-10-CM | POA: Diagnosis not present

## 2019-09-25 DIAGNOSIS — R600 Localized edema: Secondary | ICD-10-CM | POA: Diagnosis not present

## 2019-09-25 DIAGNOSIS — Z9181 History of falling: Secondary | ICD-10-CM | POA: Diagnosis not present

## 2019-09-25 DIAGNOSIS — Z8616 Personal history of COVID-19: Secondary | ICD-10-CM | POA: Diagnosis not present

## 2019-09-25 DIAGNOSIS — I480 Paroxysmal atrial fibrillation: Secondary | ICD-10-CM | POA: Diagnosis not present

## 2019-09-25 DIAGNOSIS — D509 Iron deficiency anemia, unspecified: Secondary | ICD-10-CM | POA: Diagnosis not present

## 2019-09-25 DIAGNOSIS — R339 Retention of urine, unspecified: Secondary | ICD-10-CM | POA: Diagnosis not present

## 2019-09-25 DIAGNOSIS — J690 Pneumonitis due to inhalation of food and vomit: Secondary | ICD-10-CM | POA: Diagnosis not present

## 2019-09-25 DIAGNOSIS — Z7901 Long term (current) use of anticoagulants: Secondary | ICD-10-CM | POA: Diagnosis not present

## 2019-09-25 DIAGNOSIS — Z9981 Dependence on supplemental oxygen: Secondary | ICD-10-CM | POA: Diagnosis not present

## 2019-09-25 DIAGNOSIS — K219 Gastro-esophageal reflux disease without esophagitis: Secondary | ICD-10-CM | POA: Diagnosis not present

## 2019-09-25 LAB — COMPREHENSIVE METABOLIC PANEL
ALT: 8 IU/L (ref 0–32)
AST: 9 IU/L (ref 0–40)
Albumin/Globulin Ratio: 1.3 (ref 1.2–2.2)
Albumin: 3.5 g/dL — ABNORMAL LOW (ref 3.7–4.7)
Alkaline Phosphatase: 105 IU/L (ref 39–117)
BUN/Creatinine Ratio: 22 (ref 12–28)
BUN: 8 mg/dL (ref 8–27)
Bilirubin Total: <0.2 mg/dL (ref 0.0–1.2)
CO2: 26 mmol/L (ref 20–29)
Calcium: 8.9 mg/dL (ref 8.7–10.3)
Chloride: 97 mmol/L (ref 96–106)
Creatinine, Ser: 0.37 mg/dL — ABNORMAL LOW (ref 0.57–1.00)
GFR calc Af Amer: 119 mL/min/{1.73_m2} (ref >59)
GFR calc non Af Amer: 103 mL/min/{1.73_m2} (ref >59)
Globulin, Total: 2.6 g/dL (ref 1.5–4.5)
Glucose: 110 mg/dL — ABNORMAL HIGH (ref 65–99)
Potassium: 4.3 mmol/L (ref 3.5–5.2)
Sodium: 136 mmol/L (ref 134–144)
Total Protein: 6.1 g/dL (ref 6.0–8.5)

## 2019-09-25 LAB — CBC WITH DIFFERENTIAL/PLATELET
Basophils Absolute: 0.1 10*3/uL (ref 0.0–0.2)
Basos: 1 %
EOS (ABSOLUTE): 0.3 10*3/uL (ref 0.0–0.4)
Eos: 4 %
Hematocrit: 32.8 % — ABNORMAL LOW (ref 34.0–46.6)
Hemoglobin: 9.6 g/dL — ABNORMAL LOW (ref 11.1–15.9)
Immature Grans (Abs): 0.1 10*3/uL (ref 0.0–0.1)
Immature Granulocytes: 1 %
Lymphocytes Absolute: 1 10*3/uL (ref 0.7–3.1)
Lymphs: 13 %
MCH: 27.6 pg (ref 26.6–33.0)
MCHC: 29.3 g/dL — ABNORMAL LOW (ref 31.5–35.7)
MCV: 94 fL (ref 79–97)
Monocytes Absolute: 0.6 10*3/uL (ref 0.1–0.9)
Monocytes: 8 %
Neutrophils Absolute: 5.7 10*3/uL (ref 1.4–7.0)
Neutrophils: 73 %
Platelets: 259 10*3/uL (ref 150–450)
RBC: 3.48 x10E6/uL — ABNORMAL LOW (ref 3.77–5.28)
RDW: 15.6 % — ABNORMAL HIGH (ref 11.7–15.4)
WBC: 7.8 10*3/uL (ref 3.4–10.8)

## 2019-09-25 NOTE — Telephone Encounter (Signed)
Tina Jimenez is calling requesting her son attend her upcoming appointment scheduled for 10/02/19 due to her being on oxygen and in a wheelchair. Please advise.

## 2019-09-25 NOTE — Telephone Encounter (Signed)
Patient calling in to let us know that her son, Nicki Reaper will be coming to her upcoming apt on 4/19 due to her being in a wheelchair and on oxygen. She states she requires his assistance. I let her know that I would make a note.

## 2019-09-26 ENCOUNTER — Ambulatory Visit: Payer: Medicare Other | Admitting: Cardiology

## 2019-09-26 ENCOUNTER — Telehealth: Payer: Self-pay

## 2019-09-26 NOTE — Telephone Encounter (Signed)
Tina Jimenez from [facility] called to report that she was out to visit [place] and her heart rate was 120-125 initially but came down to 70.  Marge Duncans, PA was notified and no changes were made.

## 2019-09-27 DIAGNOSIS — J9611 Chronic respiratory failure with hypoxia: Secondary | ICD-10-CM | POA: Diagnosis not present

## 2019-09-27 DIAGNOSIS — Z7901 Long term (current) use of anticoagulants: Secondary | ICD-10-CM | POA: Diagnosis not present

## 2019-09-27 DIAGNOSIS — Z8616 Personal history of COVID-19: Secondary | ICD-10-CM | POA: Diagnosis not present

## 2019-09-27 DIAGNOSIS — E785 Hyperlipidemia, unspecified: Secondary | ICD-10-CM | POA: Diagnosis not present

## 2019-09-27 DIAGNOSIS — T17920D Food in respiratory tract, part unspecified causing asphyxiation, subsequent encounter: Secondary | ICD-10-CM | POA: Diagnosis not present

## 2019-09-27 DIAGNOSIS — J690 Pneumonitis due to inhalation of food and vomit: Secondary | ICD-10-CM | POA: Diagnosis not present

## 2019-09-27 DIAGNOSIS — Z9981 Dependence on supplemental oxygen: Secondary | ICD-10-CM | POA: Diagnosis not present

## 2019-09-27 DIAGNOSIS — R339 Retention of urine, unspecified: Secondary | ICD-10-CM | POA: Diagnosis not present

## 2019-09-27 DIAGNOSIS — R600 Localized edema: Secondary | ICD-10-CM | POA: Diagnosis not present

## 2019-09-27 DIAGNOSIS — D509 Iron deficiency anemia, unspecified: Secondary | ICD-10-CM | POA: Diagnosis not present

## 2019-09-27 DIAGNOSIS — K219 Gastro-esophageal reflux disease without esophagitis: Secondary | ICD-10-CM | POA: Diagnosis not present

## 2019-09-27 DIAGNOSIS — Z9181 History of falling: Secondary | ICD-10-CM | POA: Diagnosis not present

## 2019-09-27 DIAGNOSIS — J449 Chronic obstructive pulmonary disease, unspecified: Secondary | ICD-10-CM | POA: Diagnosis not present

## 2019-09-27 DIAGNOSIS — Z86711 Personal history of pulmonary embolism: Secondary | ICD-10-CM | POA: Diagnosis not present

## 2019-09-27 DIAGNOSIS — S32020D Wedge compression fracture of second lumbar vertebra, subsequent encounter for fracture with routine healing: Secondary | ICD-10-CM | POA: Diagnosis not present

## 2019-09-27 DIAGNOSIS — I480 Paroxysmal atrial fibrillation: Secondary | ICD-10-CM | POA: Diagnosis not present

## 2019-09-27 NOTE — Telephone Encounter (Signed)
Spoke with Mariangel and her son this morning. Fraser Din states she is feeling very weak and nauseated not able to eat and can barely sip water  She had appt this afternoon to see back doctor in [place] ,dr Shanon Brow brown N[place].  Son states she is too weak to travel  Pt is aware that home health skilled nursing will start to see her but it probably Will not be today patient and son were both advised to take pt to er now to be evaluated by MD.  Adron Bene [facility] also spoke with both patient and son on phone and advised er visit today .  Son agreed to take pat to er today

## 2019-09-28 DIAGNOSIS — D509 Iron deficiency anemia, unspecified: Secondary | ICD-10-CM | POA: Diagnosis not present

## 2019-09-28 DIAGNOSIS — Z8616 Personal history of COVID-19: Secondary | ICD-10-CM | POA: Diagnosis not present

## 2019-09-28 DIAGNOSIS — J690 Pneumonitis due to inhalation of food and vomit: Secondary | ICD-10-CM | POA: Diagnosis not present

## 2019-09-28 DIAGNOSIS — J449 Chronic obstructive pulmonary disease, unspecified: Secondary | ICD-10-CM | POA: Diagnosis not present

## 2019-09-28 DIAGNOSIS — Z9981 Dependence on supplemental oxygen: Secondary | ICD-10-CM | POA: Diagnosis not present

## 2019-09-28 DIAGNOSIS — T17920D Food in respiratory tract, part unspecified causing asphyxiation, subsequent encounter: Secondary | ICD-10-CM | POA: Diagnosis not present

## 2019-09-28 DIAGNOSIS — R339 Retention of urine, unspecified: Secondary | ICD-10-CM | POA: Diagnosis not present

## 2019-09-28 DIAGNOSIS — J9611 Chronic respiratory failure with hypoxia: Secondary | ICD-10-CM | POA: Diagnosis not present

## 2019-09-28 DIAGNOSIS — K219 Gastro-esophageal reflux disease without esophagitis: Secondary | ICD-10-CM | POA: Diagnosis not present

## 2019-09-28 DIAGNOSIS — Z86711 Personal history of pulmonary embolism: Secondary | ICD-10-CM | POA: Diagnosis not present

## 2019-09-28 DIAGNOSIS — I480 Paroxysmal atrial fibrillation: Secondary | ICD-10-CM | POA: Diagnosis not present

## 2019-09-28 DIAGNOSIS — S32020D Wedge compression fracture of second lumbar vertebra, subsequent encounter for fracture with routine healing: Secondary | ICD-10-CM | POA: Diagnosis not present

## 2019-09-28 DIAGNOSIS — R600 Localized edema: Secondary | ICD-10-CM | POA: Diagnosis not present

## 2019-09-28 DIAGNOSIS — E785 Hyperlipidemia, unspecified: Secondary | ICD-10-CM | POA: Diagnosis not present

## 2019-09-28 DIAGNOSIS — Z9181 History of falling: Secondary | ICD-10-CM | POA: Diagnosis not present

## 2019-09-28 DIAGNOSIS — Z7901 Long term (current) use of anticoagulants: Secondary | ICD-10-CM | POA: Diagnosis not present

## 2019-10-02 ENCOUNTER — Other Ambulatory Visit: Payer: Self-pay

## 2019-10-02 ENCOUNTER — Ambulatory Visit (INDEPENDENT_AMBULATORY_CARE_PROVIDER_SITE_OTHER): Payer: Medicare Other

## 2019-10-02 ENCOUNTER — Ambulatory Visit: Payer: Medicare Other | Admitting: Cardiology

## 2019-10-02 ENCOUNTER — Encounter: Payer: Self-pay | Admitting: Cardiology

## 2019-10-02 VITALS — BP 126/70 | HR 122 | Temp 97.3°F | Ht 63.0 in | Wt 143.0 lb

## 2019-10-02 DIAGNOSIS — Z86711 Personal history of pulmonary embolism: Secondary | ICD-10-CM | POA: Insufficient documentation

## 2019-10-02 DIAGNOSIS — I4819 Other persistent atrial fibrillation: Secondary | ICD-10-CM

## 2019-10-02 DIAGNOSIS — U071 COVID-19: Secondary | ICD-10-CM | POA: Diagnosis not present

## 2019-10-02 DIAGNOSIS — Z8709 Personal history of other diseases of the respiratory system: Secondary | ICD-10-CM

## 2019-10-02 DIAGNOSIS — J1282 Pneumonia due to coronavirus disease 2019: Secondary | ICD-10-CM

## 2019-10-02 HISTORY — DX: Personal history of pulmonary embolism: Z86.711

## 2019-10-02 MED ORDER — DIGOXIN 125 MCG PO TABS: 0.1250 mg | ORAL_TABLET | Freq: Every day | ORAL | 6 refills | Status: DC

## 2019-10-02 NOTE — Patient Instructions (Signed)
Medication Instructions:  Your physician has recommended you make the following change in your medication:   Take Digoxin 0.125 mg daily.  *If you need a refill on your cardiac medications before your next appointment, please call your pharmacy*   Lab Work: Your physician recommends that you have a BMET today in the office.  If you have labs (blood work) drawn today and your tests are completely normal, you will receive your results only by: Marland Kitchen MyChart Message (if you have MyChart) OR . A paper copy in the mail If you have any lab test that is abnormal or we need to change your treatment, we will call you to review the results.   Testing/Procedures:  WHY IS MY DOCTOR PRESCRIBING ZIO? The Zio system is proven and trusted by physicians to detect and diagnose irregular heart rhythms - and has been prescribed to hundreds of thousands of patients.  The FDA has cleared the Zio system to monitor for many different kinds of irregular heart rhythms. In a study, physicians were able to reach a diagnosis 90% of the time with the Zio system1.  You can wear the Zio monitor - a small, discreet, comfortable patch - during your normal day-to-day activity, including while you sleep, shower, and exercise, while it records every single heartbeat for analysis.  1Barrett, P., et al. Comparison of 24 Hour Holter Monitoring Versus 14 Day Novel Adhesive Patch Electrocardiographic Monitoring. [place], 2014.  ZIO VS. HOLTER MONITORING The Zio monitor can be comfortably worn for up to 14 days. Holter monitors can be worn for 24 to 48 hours, limiting the time to record any irregular heart rhythms you may have. Zio is able to capture data for the 51% of patients who have their first symptom-triggered arrhythmia after 48 hours.1  LIVE WITHOUT RESTRICTIONS The Zio ambulatory cardiac monitor is a small, unobtrusive, and water-resistant patch-you might even forget you're wearing it. The Zio  monitor records and stores every beat of your heart, whether you're sleeping, working out, or showering.  Wear the monitor for 2 weeks, remove 10/16/19.   Follow-Up: At [facility], you and your health needs are our priority.  As part of our continuing mission to provide you with exceptional heart care, we have created designated Provider Care Teams.  These Care Teams include your primary Cardiologist (physician) and Advanced Practice Providers (APPs -  Physician Assistants and Nurse Practitioners) who all work together to provide you with the care you need, when you need it.  We recommend signing up for the patient portal called "MyChart".  Sign up information is provided on this After Visit Summary.  MyChart is used to connect with patients for Virtual Visits (Telemedicine).  Patients are able to view lab/test results, encounter notes, upcoming appointments, etc.  Non-urgent messages can be sent to your provider as well.   To learn more about what you can do with MyChart, go to [email or web address].    Your next appointment:   1 month(s)  The format for your next appointment:   In Person  Provider:   Jyl Heinz, MD   Other Instructions NA

## 2019-10-02 NOTE — Progress Notes (Signed)
Cardiology Office Note:    Date:  10/02/2019   ID:  Tina Jimenez, DOB [birth year], MRN [id number]  PCP:  Marge Duncans, PA-C  Cardiologist:  Jenean Lindau, MD   Referring MD: Marge Duncans, PA-C    ASSESSMENT:    1. History of COPD   2. History of pulmonary embolism   3. Pneumonia due to COVID-19 virus   4. Persistent atrial fibrillation (HCC)    PLAN:    In order of problems listed above:  1. Primary prevention stressed with patient.  Importance of compliance with diet and medication stressed and she vocalized understanding.  Her son had multiple questions which were answered to his satisfaction. 2. Persistent atrial fibrillation: We will continue current medications.  She is asymptomatic.  Her blood pressure is borderline and therefore I will initiate her on digoxin 0.125 mg daily.  We will put a 2-week ZIO monitor to understand her heart rates.  Her kidney function recently was fine.  We will have a Chem-7 done today.  Patient also will have echocardiogram as part of this evaluation.I discussed with the patient atrial fibrillation, disease process. Management and therapy including rate and rhythm control, anticoagulation benefits and potential risks were discussed extensively with the patient. Patient had multiple questions which were answered to patient's satisfaction. 3. History of COVID-19 pneumonitis: Stable and recovering and followed by primary care physician. 4. History of pulmonary embolism and thromboembolism on anticoagulation: Tolerating well. 5. Follow-up appointment in a month or earlier if she has any concerns.   Medication Adjustments/Labs and Tests Ordered: Current medicines are reviewed at length with the patient today.  Concerns regarding medicines are outlined above.  No orders of the defined types were placed in this encounter.  No orders of the defined types were placed in this encounter.    History of Present Illness:    Tina Jimenez is a 79  y.o. female who is being seen today for the evaluation of atrial fibrillation with elevated ventricular rate at the request of Marge Duncans, PA-C.Marland Kitchen  Patient has past medical history of COPD.  She has history of COVID-19 pneumonia for which she was admitted.  During that time she developed atrial fibrillation and thromboembolism and pulmonary embolism.  For this reason she is on anticoagulation.  She is accompanied by her son who is very supportive.  She denies any chest pain orthopnea or PND.  Today she is in atrial fibrillation also.  Son is very supportive.  Patient tells me that she ambulates at home with a walker without any problems.  At the time of my evaluation, the patient is alert awake oriented and in no distress.  Past Medical History:  Diagnosis Date  . Atrial fibrillation ([place])   . GERD (gastroesophageal reflux disease)   . Hyperlipemia     Past Surgical History:  Procedure Laterality Date  . APPENDECTOMY    . KYPHOPLASTY      Current Medications: Current Meds  Medication Sig  . apixaban (ELIQUIS) 5 MG TABS tablet Take 5 mg by mouth 2 (two) times daily.  . carvedilol (COREG) 6.25 MG tablet Take 6.25 mg by mouth 2 (two) times daily.  Marland Kitchen Co-Enzyme Q-10 100 MG CAPS Take 100 mg by mouth daily.  Marland Kitchen ezetimibe (ZETIA) 10 MG tablet Take by mouth.  . famotidine (PEPCID) 40 MG tablet Take 40 mg by mouth at bedtime.  . ferrous sulfate 325 (65 FE) MG tablet Take 325 mg by mouth 2 (two) times daily with  a meal.   . fluticasone (FLONASE) 50 MCG/ACT nasal spray Place 2 sprays into both nostrils daily.  . fluticasone furoate-vilanterol (BREO ELLIPTA) 100-25 MCG/INH AEPB Inhale into the lungs.  . furosemide (LASIX) 20 MG tablet TAKE 1/2 - 1 TABLET BY MOUTH DAILY AS NEEDED FOR EDEMA  . ipratropium-albuterol (DUONEB) 0.5-2.5 (3) MG/3ML SOLN Take 3 mLs by nebulization 4 (four) times daily.  Marland Kitchen loratadine (CLARITIN) 10 MG tablet TAKE 1 TABLET BY MOUTH ONCE DAILY FOR ALLERGIES  . omeprazole  (PRILOSEC) 20 MG capsule Take by mouth.     Allergies:   Klonopin [clonazepam], Lipitor [atorvastatin], and Simvastatin   Social History   Socioeconomic History  . Marital status: Widowed    Spouse name: Not on file  . Number of children: 4  . Years of education: Not on file  . Highest education level: Not on file  Occupational History  . Occupation: retired  Tobacco Use  . Smoking status: Current Every Day Smoker  . Smokeless tobacco: Never Used  Substance and Sexual Activity  . Alcohol use: Never  . Drug use: Never  . Sexual activity: Not on file  Other Topics Concern  . Not on file  Social History Narrative  . Not on file   Social Determinants of Health   Financial Resource Strain:   . Difficulty of Paying Living Expenses:   Food Insecurity:   . Worried About Charity fundraiser in the Last Year:   . Arboriculturist in the Last Year:   Transportation Needs:   . Film/video editor (Medical):   Marland Kitchen Lack of Transportation (Non-Medical):   Physical Activity:   . Days of Exercise per Week:   . Minutes of Exercise per Session:   Stress:   . Feeling of Stress :   Social Connections:   . Frequency of Communication with Friends and Family:   . Frequency of Social Gatherings with Friends and Family:   . Attends Religious Services:   . Active Member of Clubs or Organizations:   . Attends Archivist Meetings:   Marland Kitchen Marital Status:      Family History: The patient's family history includes Breast cancer in her sister; Lung cancer in her daughter and sister.  ROS:   Please see the history of present illness.    All other systems reviewed and are negative.  EKGs/Labs/Other Studies Reviewed:    The following studies were reviewed today: EKG reveals atrial fibrillation with elevated ventricular rate.   Recent Labs: 09/21/2019: ALT 8; BUN 8; Creatinine, Ser 0.37; Hemoglobin 9.6; Platelets 259; Potassium 4.3; Sodium 136  Recent Lipid Panel No results found  for: CHOL, TRIG, HDL, CHOLHDL, VLDL, LDLCALC, LDLDIRECT  Physical Exam:    VS:  BP 126/70   Pulse (!) 122   Temp (!) 97.3 F (36.3 C)   Ht 5\' 3"  (1.6 m)   Wt 143 lb (64.9 kg)   SpO2 98%   BMI 25.33 kg/m     Wt Readings from Last 3 Encounters:  10/02/19 143 lb (64.9 kg)  09/21/19 148 lb (67.1 kg)     GEN: Patient is in no acute distress HEENT: Normal NECK: No JVD; No carotid bruits LYMPHATICS: No lymphadenopathy CARDIAC: S1 S2 irregular, 2/6 systolic murmur at the apex. RESPIRATORY:  Clear to auscultation without rales, wheezing or rhonchi  ABDOMEN: Soft, non-tender, non-distended MUSCULOSKELETAL:  No edema; No deformity  SKIN: Warm and dry NEUROLOGIC:  Alert and oriented x 3 PSYCHIATRIC:  Normal  affect    Signed, Jenean Lindau, MD  10/02/2019 10:44 AM    [place]

## 2019-10-04 ENCOUNTER — Telehealth: Payer: Self-pay

## 2019-10-04 DIAGNOSIS — R339 Retention of urine, unspecified: Secondary | ICD-10-CM | POA: Diagnosis not present

## 2019-10-04 DIAGNOSIS — Z86711 Personal history of pulmonary embolism: Secondary | ICD-10-CM | POA: Diagnosis not present

## 2019-10-04 DIAGNOSIS — K219 Gastro-esophageal reflux disease without esophagitis: Secondary | ICD-10-CM | POA: Diagnosis not present

## 2019-10-04 DIAGNOSIS — J690 Pneumonitis due to inhalation of food and vomit: Secondary | ICD-10-CM | POA: Diagnosis not present

## 2019-10-04 DIAGNOSIS — Z9981 Dependence on supplemental oxygen: Secondary | ICD-10-CM | POA: Diagnosis not present

## 2019-10-04 DIAGNOSIS — R600 Localized edema: Secondary | ICD-10-CM | POA: Diagnosis not present

## 2019-10-04 DIAGNOSIS — Z9181 History of falling: Secondary | ICD-10-CM | POA: Diagnosis not present

## 2019-10-04 DIAGNOSIS — D509 Iron deficiency anemia, unspecified: Secondary | ICD-10-CM | POA: Diagnosis not present

## 2019-10-04 DIAGNOSIS — I480 Paroxysmal atrial fibrillation: Secondary | ICD-10-CM | POA: Diagnosis not present

## 2019-10-04 DIAGNOSIS — E785 Hyperlipidemia, unspecified: Secondary | ICD-10-CM | POA: Diagnosis not present

## 2019-10-04 DIAGNOSIS — Z7901 Long term (current) use of anticoagulants: Secondary | ICD-10-CM | POA: Diagnosis not present

## 2019-10-04 DIAGNOSIS — S32020D Wedge compression fracture of second lumbar vertebra, subsequent encounter for fracture with routine healing: Secondary | ICD-10-CM | POA: Diagnosis not present

## 2019-10-04 DIAGNOSIS — T17920D Food in respiratory tract, part unspecified causing asphyxiation, subsequent encounter: Secondary | ICD-10-CM | POA: Diagnosis not present

## 2019-10-04 DIAGNOSIS — J449 Chronic obstructive pulmonary disease, unspecified: Secondary | ICD-10-CM | POA: Diagnosis not present

## 2019-10-04 DIAGNOSIS — Z8616 Personal history of COVID-19: Secondary | ICD-10-CM | POA: Diagnosis not present

## 2019-10-04 DIAGNOSIS — J9611 Chronic respiratory failure with hypoxia: Secondary | ICD-10-CM | POA: Diagnosis not present

## 2019-10-04 NOTE — Telephone Encounter (Signed)
Tina Jimenez called to report that Tina Jimenez declined OT at this time.  She did not feel that she was in need of their services.

## 2019-10-05 DIAGNOSIS — J449 Chronic obstructive pulmonary disease, unspecified: Secondary | ICD-10-CM | POA: Diagnosis not present

## 2019-10-05 DIAGNOSIS — S32020D Wedge compression fracture of second lumbar vertebra, subsequent encounter for fracture with routine healing: Secondary | ICD-10-CM | POA: Diagnosis not present

## 2019-10-05 DIAGNOSIS — Z7901 Long term (current) use of anticoagulants: Secondary | ICD-10-CM | POA: Diagnosis not present

## 2019-10-05 DIAGNOSIS — E785 Hyperlipidemia, unspecified: Secondary | ICD-10-CM | POA: Diagnosis not present

## 2019-10-05 DIAGNOSIS — D509 Iron deficiency anemia, unspecified: Secondary | ICD-10-CM | POA: Diagnosis not present

## 2019-10-05 DIAGNOSIS — Z86711 Personal history of pulmonary embolism: Secondary | ICD-10-CM | POA: Diagnosis not present

## 2019-10-05 DIAGNOSIS — K219 Gastro-esophageal reflux disease without esophagitis: Secondary | ICD-10-CM | POA: Diagnosis not present

## 2019-10-05 DIAGNOSIS — Z9181 History of falling: Secondary | ICD-10-CM | POA: Diagnosis not present

## 2019-10-05 DIAGNOSIS — R600 Localized edema: Secondary | ICD-10-CM | POA: Diagnosis not present

## 2019-10-05 DIAGNOSIS — I480 Paroxysmal atrial fibrillation: Secondary | ICD-10-CM | POA: Diagnosis not present

## 2019-10-05 DIAGNOSIS — J9611 Chronic respiratory failure with hypoxia: Secondary | ICD-10-CM | POA: Diagnosis not present

## 2019-10-05 DIAGNOSIS — Z9981 Dependence on supplemental oxygen: Secondary | ICD-10-CM | POA: Diagnosis not present

## 2019-10-05 DIAGNOSIS — Z8616 Personal history of COVID-19: Secondary | ICD-10-CM | POA: Diagnosis not present

## 2019-10-05 DIAGNOSIS — J690 Pneumonitis due to inhalation of food and vomit: Secondary | ICD-10-CM | POA: Diagnosis not present

## 2019-10-05 DIAGNOSIS — R339 Retention of urine, unspecified: Secondary | ICD-10-CM | POA: Diagnosis not present

## 2019-10-05 DIAGNOSIS — T17920D Food in respiratory tract, part unspecified causing asphyxiation, subsequent encounter: Secondary | ICD-10-CM | POA: Diagnosis not present

## 2019-10-09 DIAGNOSIS — R600 Localized edema: Secondary | ICD-10-CM | POA: Diagnosis not present

## 2019-10-09 DIAGNOSIS — T17920D Food in respiratory tract, part unspecified causing asphyxiation, subsequent encounter: Secondary | ICD-10-CM | POA: Diagnosis not present

## 2019-10-09 DIAGNOSIS — E785 Hyperlipidemia, unspecified: Secondary | ICD-10-CM | POA: Diagnosis not present

## 2019-10-09 DIAGNOSIS — R339 Retention of urine, unspecified: Secondary | ICD-10-CM | POA: Diagnosis not present

## 2019-10-09 DIAGNOSIS — K219 Gastro-esophageal reflux disease without esophagitis: Secondary | ICD-10-CM | POA: Diagnosis not present

## 2019-10-09 DIAGNOSIS — D509 Iron deficiency anemia, unspecified: Secondary | ICD-10-CM | POA: Diagnosis not present

## 2019-10-09 DIAGNOSIS — J9611 Chronic respiratory failure with hypoxia: Secondary | ICD-10-CM | POA: Diagnosis not present

## 2019-10-09 DIAGNOSIS — J449 Chronic obstructive pulmonary disease, unspecified: Secondary | ICD-10-CM | POA: Diagnosis not present

## 2019-10-09 DIAGNOSIS — Z9981 Dependence on supplemental oxygen: Secondary | ICD-10-CM | POA: Diagnosis not present

## 2019-10-09 DIAGNOSIS — S32020D Wedge compression fracture of second lumbar vertebra, subsequent encounter for fracture with routine healing: Secondary | ICD-10-CM | POA: Diagnosis not present

## 2019-10-09 DIAGNOSIS — J690 Pneumonitis due to inhalation of food and vomit: Secondary | ICD-10-CM | POA: Diagnosis not present

## 2019-10-09 DIAGNOSIS — Z7901 Long term (current) use of anticoagulants: Secondary | ICD-10-CM | POA: Diagnosis not present

## 2019-10-09 DIAGNOSIS — I480 Paroxysmal atrial fibrillation: Secondary | ICD-10-CM | POA: Diagnosis not present

## 2019-10-09 DIAGNOSIS — Z86711 Personal history of pulmonary embolism: Secondary | ICD-10-CM | POA: Diagnosis not present

## 2019-10-09 DIAGNOSIS — Z8616 Personal history of COVID-19: Secondary | ICD-10-CM | POA: Diagnosis not present

## 2019-10-09 DIAGNOSIS — Z9181 History of falling: Secondary | ICD-10-CM | POA: Diagnosis not present

## 2019-10-09 DIAGNOSIS — R531 Weakness: Secondary | ICD-10-CM | POA: Diagnosis not present

## 2019-10-10 DIAGNOSIS — J449 Chronic obstructive pulmonary disease, unspecified: Secondary | ICD-10-CM | POA: Diagnosis not present

## 2019-10-11 DIAGNOSIS — J690 Pneumonitis due to inhalation of food and vomit: Secondary | ICD-10-CM | POA: Diagnosis not present

## 2019-10-11 DIAGNOSIS — J449 Chronic obstructive pulmonary disease, unspecified: Secondary | ICD-10-CM | POA: Diagnosis not present

## 2019-10-11 DIAGNOSIS — R339 Retention of urine, unspecified: Secondary | ICD-10-CM | POA: Diagnosis not present

## 2019-10-11 DIAGNOSIS — Z9981 Dependence on supplemental oxygen: Secondary | ICD-10-CM | POA: Diagnosis not present

## 2019-10-11 DIAGNOSIS — T17920D Food in respiratory tract, part unspecified causing asphyxiation, subsequent encounter: Secondary | ICD-10-CM | POA: Diagnosis not present

## 2019-10-11 DIAGNOSIS — R600 Localized edema: Secondary | ICD-10-CM | POA: Diagnosis not present

## 2019-10-11 DIAGNOSIS — S32020D Wedge compression fracture of second lumbar vertebra, subsequent encounter for fracture with routine healing: Secondary | ICD-10-CM | POA: Diagnosis not present

## 2019-10-11 DIAGNOSIS — I480 Paroxysmal atrial fibrillation: Secondary | ICD-10-CM | POA: Diagnosis not present

## 2019-10-11 DIAGNOSIS — Z86711 Personal history of pulmonary embolism: Secondary | ICD-10-CM | POA: Diagnosis not present

## 2019-10-11 DIAGNOSIS — K219 Gastro-esophageal reflux disease without esophagitis: Secondary | ICD-10-CM | POA: Diagnosis not present

## 2019-10-11 DIAGNOSIS — Z9181 History of falling: Secondary | ICD-10-CM | POA: Diagnosis not present

## 2019-10-11 DIAGNOSIS — D509 Iron deficiency anemia, unspecified: Secondary | ICD-10-CM | POA: Diagnosis not present

## 2019-10-11 DIAGNOSIS — E785 Hyperlipidemia, unspecified: Secondary | ICD-10-CM | POA: Diagnosis not present

## 2019-10-11 DIAGNOSIS — Z7901 Long term (current) use of anticoagulants: Secondary | ICD-10-CM | POA: Diagnosis not present

## 2019-10-11 DIAGNOSIS — Z8616 Personal history of COVID-19: Secondary | ICD-10-CM | POA: Diagnosis not present

## 2019-10-11 DIAGNOSIS — J9611 Chronic respiratory failure with hypoxia: Secondary | ICD-10-CM | POA: Diagnosis not present

## 2019-10-12 ENCOUNTER — Other Ambulatory Visit: Payer: Self-pay | Admitting: Physician Assistant

## 2019-10-13 ENCOUNTER — Inpatient Hospital Stay (HOSPITAL_COMMUNITY)
Admission: EM | Admit: 2019-10-13 | Discharge: 2019-10-16 | DRG: 690 | Disposition: A | Discharge status: Home / Self Care | Payer: Medicare Other | Attending: Internal Medicine | Admitting: Internal Medicine

## 2019-10-13 ENCOUNTER — Encounter (HOSPITAL_COMMUNITY): Payer: Self-pay

## 2019-10-13 ENCOUNTER — Other Ambulatory Visit: Payer: Self-pay

## 2019-10-13 ENCOUNTER — Emergency Department (HOSPITAL_COMMUNITY): Payer: Medicare Other

## 2019-10-13 DIAGNOSIS — I10 Essential (primary) hypertension: Secondary | ICD-10-CM | POA: Diagnosis not present

## 2019-10-13 DIAGNOSIS — R630 Anorexia: Secondary | ICD-10-CM | POA: Diagnosis not present

## 2019-10-13 DIAGNOSIS — D509 Iron deficiency anemia, unspecified: Secondary | ICD-10-CM | POA: Diagnosis not present

## 2019-10-13 DIAGNOSIS — Z20822 Contact with and (suspected) exposure to covid-19: Secondary | ICD-10-CM | POA: Diagnosis present

## 2019-10-13 DIAGNOSIS — J449 Chronic obstructive pulmonary disease, unspecified: Secondary | ICD-10-CM | POA: Diagnosis present

## 2019-10-13 DIAGNOSIS — Z79899 Other long term (current) drug therapy: Secondary | ICD-10-CM

## 2019-10-13 DIAGNOSIS — M542 Cervicalgia: Secondary | ICD-10-CM | POA: Diagnosis not present

## 2019-10-13 DIAGNOSIS — F17200 Nicotine dependence, unspecified, uncomplicated: Secondary | ICD-10-CM | POA: Diagnosis present

## 2019-10-13 DIAGNOSIS — Z888 Allergy status to other drugs, medicaments and biological substances status: Secondary | ICD-10-CM

## 2019-10-13 DIAGNOSIS — M5489 Other dorsalgia: Secondary | ICD-10-CM | POA: Diagnosis not present

## 2019-10-13 DIAGNOSIS — Z7901 Long term (current) use of anticoagulants: Secondary | ICD-10-CM

## 2019-10-13 DIAGNOSIS — Z86718 Personal history of other venous thrombosis and embolism: Secondary | ICD-10-CM | POA: Diagnosis not present

## 2019-10-13 DIAGNOSIS — N9489 Other specified conditions associated with female genital organs and menstrual cycle: Secondary | ICD-10-CM | POA: Diagnosis not present

## 2019-10-13 DIAGNOSIS — B9620 Unspecified Escherichia coli [E. coli] as the cause of diseases classified elsewhere: Secondary | ICD-10-CM | POA: Diagnosis not present

## 2019-10-13 DIAGNOSIS — Z86711 Personal history of pulmonary embolism: Secondary | ICD-10-CM

## 2019-10-13 DIAGNOSIS — I4819 Other persistent atrial fibrillation: Secondary | ICD-10-CM | POA: Diagnosis present

## 2019-10-13 DIAGNOSIS — I4891 Unspecified atrial fibrillation: Secondary | ICD-10-CM | POA: Diagnosis not present

## 2019-10-13 DIAGNOSIS — J9610 Chronic respiratory failure, unspecified whether with hypoxia or hypercapnia: Secondary | ICD-10-CM | POA: Diagnosis not present

## 2019-10-13 DIAGNOSIS — Z743 Need for continuous supervision: Secondary | ICD-10-CM | POA: Diagnosis not present

## 2019-10-13 DIAGNOSIS — R109 Unspecified abdominal pain: Secondary | ICD-10-CM

## 2019-10-13 DIAGNOSIS — E785 Hyperlipidemia, unspecified: Secondary | ICD-10-CM | POA: Diagnosis present

## 2019-10-13 DIAGNOSIS — N3090 Cystitis, unspecified without hematuria: Principal | ICD-10-CM | POA: Diagnosis present

## 2019-10-13 DIAGNOSIS — I714 Abdominal aortic aneurysm, without rupture: Secondary | ICD-10-CM | POA: Diagnosis present

## 2019-10-13 DIAGNOSIS — I2510 Atherosclerotic heart disease of native coronary artery without angina pectoris: Secondary | ICD-10-CM | POA: Diagnosis not present

## 2019-10-13 DIAGNOSIS — I708 Atherosclerosis of other arteries: Secondary | ICD-10-CM | POA: Diagnosis not present

## 2019-10-13 DIAGNOSIS — Z8616 Personal history of COVID-19: Secondary | ICD-10-CM

## 2019-10-13 DIAGNOSIS — R100 Acute abdomen: Secondary | ICD-10-CM | POA: Diagnosis not present

## 2019-10-13 DIAGNOSIS — Z66 Do not resuscitate: Secondary | ICD-10-CM | POA: Diagnosis present

## 2019-10-13 DIAGNOSIS — M549 Dorsalgia, unspecified: Secondary | ICD-10-CM | POA: Diagnosis present

## 2019-10-13 DIAGNOSIS — K219 Gastro-esophageal reflux disease without esophagitis: Secondary | ICD-10-CM | POA: Diagnosis not present

## 2019-10-13 DIAGNOSIS — R1030 Lower abdominal pain, unspecified: Secondary | ICD-10-CM | POA: Diagnosis not present

## 2019-10-13 DIAGNOSIS — N839 Noninflammatory disorder of ovary, fallopian tube and broad ligament, unspecified: Secondary | ICD-10-CM | POA: Diagnosis present

## 2019-10-13 DIAGNOSIS — R6881 Early satiety: Secondary | ICD-10-CM | POA: Diagnosis present

## 2019-10-13 DIAGNOSIS — K5909 Other constipation: Secondary | ICD-10-CM | POA: Diagnosis present

## 2019-10-13 DIAGNOSIS — R1084 Generalized abdominal pain: Secondary | ICD-10-CM | POA: Diagnosis not present

## 2019-10-13 DIAGNOSIS — R140 Abdominal distension (gaseous): Secondary | ICD-10-CM | POA: Diagnosis not present

## 2019-10-13 HISTORY — DX: Unspecified abdominal pain: R10.9

## 2019-10-13 LAB — CBC WITH DIFFERENTIAL/PLATELET
Abs Immature Granulocytes: 0.03 10*3/uL (ref 0.00–0.07)
Basophils Absolute: 0.1 10*3/uL (ref 0.0–0.1)
Basophils Relative: 1 %
Eosinophils Absolute: 0.1 10*3/uL (ref 0.0–0.5)
Eosinophils Relative: 1 %
HCT: 36.7 % (ref 36.0–46.0)
Hemoglobin: 11.1 g/dL — ABNORMAL LOW (ref 12.0–15.0)
Immature Granulocytes: 0 %
Lymphocytes Relative: 7 %
Lymphs Abs: 0.7 10*3/uL (ref 0.7–4.0)
MCH: 26.9 pg (ref 26.0–34.0)
MCHC: 30.2 g/dL (ref 30.0–36.0)
MCV: 88.9 fL (ref 80.0–100.0)
Monocytes Absolute: 0.9 10*3/uL (ref 0.1–1.0)
Monocytes Relative: 9 %
Neutro Abs: 7.6 10*3/uL (ref 1.7–7.7)
Neutrophils Relative %: 82 %
Platelets: 247 10*3/uL (ref 150–400)
RBC: 4.13 MIL/uL (ref 3.87–5.11)
RDW: 18.1 % — ABNORMAL HIGH (ref 11.5–15.5)
WBC: 9.3 10*3/uL (ref 4.0–10.5)
nRBC: 0 % (ref 0.0–0.2)

## 2019-10-13 LAB — URINALYSIS, ROUTINE W REFLEX MICROSCOPIC
Bilirubin Urine: NEGATIVE
Glucose, UA: NEGATIVE mg/dL
Ketones, ur: NEGATIVE mg/dL
Nitrite: NEGATIVE
Protein, ur: 30 mg/dL — AB
Specific Gravity, Urine: 1.021 (ref 1.005–1.030)
WBC, UA: >50 WBC/hpf — ABNORMAL HIGH (ref 0–5)
pH: 8 (ref 5.0–8.0)

## 2019-10-13 LAB — COMPREHENSIVE METABOLIC PANEL
ALT: 10 U/L (ref 0–44)
AST: 15 U/L (ref 15–41)
Albumin: 3.1 g/dL — ABNORMAL LOW (ref 3.5–5.0)
Alkaline Phosphatase: 76 U/L (ref 38–126)
Anion gap: 10 (ref 5–15)
BUN: 7 mg/dL — ABNORMAL LOW (ref 8–23)
CO2: 29 mmol/L (ref 22–32)
Calcium: 9 mg/dL (ref 8.9–10.3)
Chloride: 94 mmol/L — ABNORMAL LOW (ref 98–111)
Creatinine, Ser: 0.46 mg/dL (ref 0.44–1.00)
GFR calc Af Amer: >60 mL/min (ref >60)
GFR calc non Af Amer: >60 mL/min (ref >60)
Glucose, Bld: 110 mg/dL — ABNORMAL HIGH (ref 70–99)
Potassium: 4.4 mmol/L (ref 3.5–5.1)
Sodium: 133 mmol/L — ABNORMAL LOW (ref 135–145)
Total Bilirubin: 0.6 mg/dL (ref 0.3–1.2)
Total Protein: 6.5 g/dL (ref 6.5–8.1)

## 2019-10-13 LAB — GLUCOSE, CAPILLARY: Glucose-Capillary: 103 mg/dL — ABNORMAL HIGH (ref 70–99)

## 2019-10-13 LAB — RESPIRATORY PANEL BY RT PCR (FLU A&B, COVID)
Influenza A by PCR: NEGATIVE
Influenza B by PCR: NEGATIVE
SARS Coronavirus 2 by RT PCR: NEGATIVE

## 2019-10-13 LAB — LACTIC ACID, PLASMA: Lactic Acid, Venous: 0.9 mmol/L (ref 0.5–1.9)

## 2019-10-13 LAB — LIPASE, BLOOD: Lipase: 20 U/L (ref 11–51)

## 2019-10-13 MED ORDER — SENNOSIDES-DOCUSATE SODIUM 8.6-50 MG PO TABS: 1.0000 | ORAL_TABLET | Freq: Every evening | ORAL | Status: DC | PRN

## 2019-10-13 MED ORDER — FAMOTIDINE 20 MG PO TABS
40.0000 mg | ORAL_TABLET | Freq: Every day | ORAL | Status: DC
  Administered 2019-10-13 – 2019-10-15 (×3): 40 mg via ORAL
  Filled 2019-10-13 (×3): qty 2

## 2019-10-13 MED ORDER — EZETIMIBE 10 MG PO TABS
10.0000 mg | ORAL_TABLET | Freq: Every day | ORAL | Status: DC
  Administered 2019-10-14 – 2019-10-16 (×3): 10 mg via ORAL
  Filled 2019-10-13 (×3): qty 1

## 2019-10-13 MED ORDER — FENTANYL CITRATE (PF) 100 MCG/2ML IJ SOLN
50.0000 ug | Freq: Once | INTRAMUSCULAR | Status: AC
  Administered 2019-10-13: 50 ug via INTRAVENOUS
  Filled 2019-10-13: qty 2

## 2019-10-13 MED ORDER — SODIUM CHLORIDE 0.9 % IV BOLUS
500.0000 mL | Freq: Once | INTRAVENOUS | Status: AC
  Administered 2019-10-13: 500 mL via INTRAVENOUS

## 2019-10-13 MED ORDER — PANTOPRAZOLE SODIUM 40 MG PO TBEC
40.0000 mg | DELAYED_RELEASE_TABLET | Freq: Every day | ORAL | Status: DC
  Administered 2019-10-14 – 2019-10-16 (×3): 40 mg via ORAL
  Filled 2019-10-13 (×3): qty 1

## 2019-10-13 MED ORDER — LORATADINE 10 MG PO TABS
10.0000 mg | ORAL_TABLET | Freq: Every day | ORAL | Status: DC
  Administered 2019-10-14 – 2019-10-16 (×3): 10 mg via ORAL
  Filled 2019-10-13 (×3): qty 1

## 2019-10-13 MED ORDER — FLUTICASONE FUROATE-VILANTEROL 100-25 MCG/INH IN AEPB
1.0000 | INHALATION_SPRAY | Freq: Every day | RESPIRATORY_TRACT | Status: DC
  Administered 2019-10-14 – 2019-10-16 (×3): 1 via RESPIRATORY_TRACT
  Filled 2019-10-13: qty 28

## 2019-10-13 MED ORDER — SODIUM CHLORIDE 0.9 % IV SOLN: 1.0000 g | INTRAVENOUS | Status: DC

## 2019-10-13 MED ORDER — POLYETHYLENE GLYCOL 3350 17 G PO PACK
17.0000 g | PACK | Freq: Every day | ORAL | Status: DC
  Administered 2019-10-13 – 2019-10-16 (×4): 17 g via ORAL
  Filled 2019-10-13 (×4): qty 1

## 2019-10-13 MED ORDER — CO-ENZYME Q-10 100 MG PO CAPS: 100.0000 mg | ORAL_CAPSULE | Freq: Every day | ORAL | Status: DC

## 2019-10-13 MED ORDER — ACETAMINOPHEN 325 MG PO TABS
650.0000 mg | ORAL_TABLET | Freq: Four times a day (QID) | ORAL | Status: DC | PRN
  Administered 2019-10-13: 650 mg via ORAL
  Filled 2019-10-13: qty 2

## 2019-10-13 MED ORDER — SODIUM CHLORIDE 0.9 % IV SOLN
1.0000 g | INTRAVENOUS | Status: DC
  Administered 2019-10-14: 1 g via INTRAVENOUS
  Filled 2019-10-13 (×2): qty 10

## 2019-10-13 MED ORDER — IOHEXOL 350 MG/ML SOLN
100.0000 mL | Freq: Once | INTRAVENOUS | Status: AC | PRN
  Administered 2019-10-12: 100 mL via INTRAVENOUS

## 2019-10-13 MED ORDER — DIGOXIN 125 MCG PO TABS: 0.1250 mg | ORAL_TABLET | Freq: Every day | ORAL | Status: DC

## 2019-10-13 MED ORDER — SODIUM CHLORIDE 0.9 % IV SOLN
1.0000 g | Freq: Once | INTRAVENOUS | Status: AC
  Administered 2019-10-13: 1 g via INTRAVENOUS
  Filled 2019-10-13: qty 10

## 2019-10-13 MED ORDER — IPRATROPIUM-ALBUTEROL 0.5-2.5 (3) MG/3ML IN SOLN
3.0000 mL | RESPIRATORY_TRACT | Status: DC | PRN
  Administered 2019-10-13 – 2019-10-15 (×3): 3 mL via RESPIRATORY_TRACT
  Filled 2019-10-13 (×4): qty 3

## 2019-10-13 MED ORDER — ACETAMINOPHEN 650 MG RE SUPP: 650.0000 mg | Freq: Four times a day (QID) | RECTAL | Status: DC | PRN

## 2019-10-13 MED ORDER — ALBUTEROL SULFATE (2.5 MG/3ML) 0.083% IN NEBU
2.5000 mg | INHALATION_SOLUTION | Freq: Once | RESPIRATORY_TRACT | Status: AC
  Administered 2019-10-13: 2.5 mg via RESPIRATORY_TRACT
  Filled 2019-10-13: qty 3

## 2019-10-13 MED ORDER — APIXABAN 5 MG PO TABS
5.0000 mg | ORAL_TABLET | Freq: Two times a day (BID) | ORAL | Status: DC
  Administered 2019-10-13 – 2019-10-16 (×6): 5 mg via ORAL
  Filled 2019-10-13 (×7): qty 1

## 2019-10-13 MED ORDER — FENTANYL CITRATE (PF) 100 MCG/2ML IJ SOLN
50.0000 ug | INTRAMUSCULAR | Status: DC | PRN
  Administered 2019-10-13 – 2019-10-14 (×3): 50 ug via INTRAVENOUS
  Filled 2019-10-13 (×3): qty 2

## 2019-10-13 MED ORDER — CARVEDILOL 6.25 MG PO TABS
6.2500 mg | ORAL_TABLET | Freq: Two times a day (BID) | ORAL | Status: DC
  Administered 2019-10-13 – 2019-10-16 (×6): 6.25 mg via ORAL
  Filled 2019-10-13 (×6): qty 1

## 2019-10-13 MED ORDER — FERROUS SULFATE 325 (65 FE) MG PO TABS
325.0000 mg | ORAL_TABLET | Freq: Two times a day (BID) | ORAL | Status: DC
  Administered 2019-10-13 – 2019-10-16 (×6): 325 mg via ORAL
  Filled 2019-10-13 (×6): qty 1

## 2019-10-13 NOTE — Progress Notes (Signed)
NAME:  DAYANNA INGHRAM, MRN:  [id number], DOB:  08/07/1940, LOS: 0 ADMISSION DATE:  10/13/2019   Brief History  79 yo female admitted to IMTS on 4/30 for symptoms consistent with complicated UTI.  Subjective/Interm hx  No overnight events. Continues to have abdominal pain that is improved with fentanyl  Significant Hospital Events   4/30 hospital admission  Objective   Blood pressure (!) 140/119, pulse (!) 112, temperature (!) 100.4 F (38 C), temperature source Oral, resp. rate (!) 21, height 5\' 3"  (1.6 m), weight 64.9 kg, SpO2 95 %.     Intake/Output Summary (Last 24 hours) at 10/13/2019 1903 Last data filed at 10/13/2019 1624 Gross per 24 hour  Intake 600 ml  Output -  Net 600 ml   Filed Weights   10/13/19 1136  Weight: 64.9 kg    Examination: GENERAL: in no acute distress CARDIAC: tachycardic rate. Irregular rhythm. PULMONARY: Lung sounds clear to auscultation. ABDOMEN: mildly distended. bs sounds active. Diffuse tenderness to palpation--worse at the LLQ SKIN: no rash or lesions on limited exam   Consults:  none  Significant Diagnostic Tests:  4/30 dissection CTA>> no evidence of aortic aneurysm or dissection. Moderate centrilobar emphysematous disease. 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  Increased soft tissue density over the midline floor of the bladder.  Micro Data:  4/30 urine culture>>NGTD  Antimicrobials:  Rocephin 4/30>>  Labs    CBC Latest Ref Rng & Units 10/13/2019 09/21/2019 09/11/2019  WBC 4.0 - 10.5 K/uL 9.3 7.8 9.7  Hemoglobin 12.0 - 15.0 g/dL 11.1(L) 9.6(L) -  Hematocrit 36.0 - 46.0 % 36.7 32.8(L) -  Platelets 150 - 400 K/uL 247 259 -   BMP Latest Ref Rng & Units 10/13/2019 09/21/2019 08/21/2019  Glucose 70 - 99 mg/dL 110(H) 110(H) 108(H)  BUN 8 - 23 mg/dL 7(L) 8 13  Creatinine 0.44 - 1.00 mg/dL 0.46 0.37(L) 0.39(L)  BUN/Creat Ratio 12 - 28 - 22 33(H)  Sodium 135 - 145 mmol/L 133(L) 136 140   Potassium 3.5 - 5.1 mmol/L 4.4 4.3 4.6  Chloride 98 - 111 mmol/L 94(L) 97 99  CO2 22 - 32 mmol/L 29 26 27   Calcium 8.9 - 10.3 mg/dL 9.0 8.9 8.8    Summary  79 yo female admitted to IMTS for severe abdominal pain and found to have bacturia without CT findings of pyelonephritis or stone. Pain seems to be out of proportion to a diagnosis of cystitis so we are working up other etiologies to her abdominal pain at this time.  Assessment & Plan:  Active Problems:   Abdominal pain   Abdominal pain. Broad ddx including UTI vs malignancy vs ovarian torsion vs digoxin side effect Suspected complicated UTI. UA on admission with significant amount of bacteria but no nitrites. Urine culture pending. No abdominal CT findings to suggest pyelonephritis. No white count, no AKI and was afebrile on admission however did spike a temp to 100.6 last evening. Pt denies dysuria or changes in frequency/urgency. Fever and bacturia supportive of infection however would not suspect her degree of pain with cystitis.  -no CT findings to suggest nephrolithiasis or obstruction however can not r/o radiolucent stones however no crystals found in urine. Right Adenexal mass and focal increased bladder wall density -Malignancy is certainly a consideration at this time however given the acute onset of pain and that the adenexal mass is fairly stable in size and pt denies any concerning symptoms for malignancy. Unsure of the  correlation between the bladder finding and symptoms however would find it unusual for it to be focalized to a single location in the bladder if it were to be related to cystitis. Some RBCs present on UA however would be expected in the setting of UTI -We can not rule out ovarian torsion at this time either. Discussed CTA with radiology regarding the sensitivity for torsion however, unfortunately, CT has poor sensitivity for torsion. -Another though would be digoxin side effect. Level mildly elevated in January at  2.2 Plan Continue rocephin--treatment day #2/7 Follow blood and urine cultures Pelvic US to r/o torsion Check digoxin level. Will hold digoxin dose today until level returns Will work on deescalating narcotic use. D/c fentanyl. Will start oxycodone 5mg  q4h with IV dilaudid prn for breakthrough pain. GYN follow up at discharge  Iron deficiency anemia. Stable on admission. Iron deficiency supported on iron panel performed in march. Will need age appropriate screening as well as further investigation into the bladder wall and adenexal findings at discharge. Continue iron supplmentation  Atrial fibrillation. Rate controlled on carvedilol. In afib on admission. Continue eliquis and carvedilol. Hold digoxin until level returns. Centrilobar Emphysema. Continue breo ellipta and prn duonebs.  Best practice:  CODE STATUS: DNR/DNI Diet: cardiac DVT for prophylaxis: eliquis Social considerations/Family communication: per patient Dispo: pending further evaluation and pain management   Mitzi Hansen, MD [facility] PGY-1 PAGER #: [phone] 10/14/19  7:03 PM

## 2019-10-13 NOTE — ED Notes (Signed)
Pt transported to CT ?

## 2019-10-13 NOTE — ED Notes (Signed)
Dinner tray ordered.

## 2019-10-13 NOTE — ED Triage Notes (Signed)
Pt from home with [facility] for c.o severe abd pain since yesterday, worsening throughout the night, pt also c.o pain in between her shoulder blades. Denies n/v/d. Pt has hx of AAA in Jan after having surgery on L4. Pt on 4L  all the time, hx of COPD. VSS, pt arrives a/o

## 2019-10-13 NOTE — ED Provider Notes (Signed)
Care assumed from [place], PA-C See not for full HPI.  In summation 79 year old presents for ABd pain. Diffuse in nature. Afib on monitor without RVR. Zio patch to left upper chest wall for rate monitoring. Followed with [facility] group, pt doesn't know name.   Abd pain started yesterday. Still with pain after IV pain meds x 2. Labs and imaging personally reviewed. CT reassuring. Has UTI on  UA. Chronic 4 L oxygen at baseline. Recently Started on a "little pill for heart rate." Denies CP, SOB. No edma to BLE.  Work up completed by previous provider. Plan to admit for intractable abd pain. No emesis, diarrhea.   Previous provider had low suspicion for GI bleed. Physical Exam  BP (!) 181/92   Pulse (!) 116   Temp 99.3 F (37.4 C) (Oral)   Resp 20   Ht 5\' 3"  (1.6 m)   Wt 64.9 kg   SpO2 98%   BMI 25.33 kg/m   Physical Exam Vitals and nursing note reviewed.  Constitutional:      General: She is not in acute distress.    Appearance: She is well-developed. She is ill-appearing. She is not toxic-appearing. Diaphoretic: Chronically ill appearing.  HENT:     Head: Normocephalic and atraumatic.     Mouth/Throat:     Pharynx: Oropharynx is clear.     Comments: Dry mucous membranes Eyes:     Pupils: Pupils are equal, round, and reactive to light.  Cardiovascular:     Rate and Rhythm: Tachycardia present. Rhythm irregular.     Pulses:          Dorsalis pedis pulses are 2+ on the right side and 2+ on the left side.       Posterior tibial pulses are 2+ on the right side and 2+ on the left side.     Heart sounds: Normal heart sounds.     Comments: HR low 100's Pulmonary:     Effort: Pulmonary effort is normal. No respiratory distress.     Breath sounds: Normal breath sounds and air entry.     Comments: 4 L [place] at baseline Abdominal:     General: Abdomen is flat. Bowel sounds are normal. There is no distension.     Tenderness: There is generalized abdominal tenderness.  There is no right CVA tenderness, left CVA tenderness or rebound. Negative signs include Murphy's sign and McBurney's sign.     Hernia: No hernia is present.     Comments: Old scar to midline abdomen  Musculoskeletal:        General: Normal range of motion.     Cervical back: Normal range of motion.     Comments: Moves all 4 extremities without difficulty.  Compartments soft.  Bevelyn Buckles' sign negative.  No edema to lower extremities  Feet:     Right foot:     Skin integrity: Skin integrity normal.     Left foot:     Skin integrity: Skin integrity normal.  Skin:    General: Skin is warm and dry.     Capillary Refill: Capillary refill takes less than 2 seconds.     Comments: No edema, erythema ,warmth  Neurological:     General: No focal deficit present.     Mental Status: She is alert.     Comments: At baseline per family in room    ED Course/Procedures   Clinical Course as of Oct 12 1640  Fri Oct 13, 2019  1257  79 year old female with a history of A. fib on Eliquis, pulmonary embolism, presented to emergency department abdominal pain.  She reports gradual onset of diffuse abdominal pain yesterday.  She describes severe pain all across her abdomen.  She is never had this before.  The pain has been constant since yesterday.  On exam the patient initially overall appears comfortable.  She does have some mild distention of the abdomen and very diffuse tenderness with mild guarding.  Her blood pressure stable.  On EKG she continues to be in A. fib but overall rate controlled with HR around 100 bpm.  Differential remains broad at this time.  She does have a history of appendectomy.  However mesenteric ischemia or vascular injury in the abdomen remain high on the emergent differential.  Will obtain blood work including a lactate.  We will also try to obtain a rapid CT scan.  We will also give fentanyl for pain.   [MT]  1408 Lactic Acid, Venous: 0.9 [MT]  1459 On reassessment her pain had improved  initially, now returns severely.  She reports she has not had a "real" BM in several days, and feels like "food just lays fever in me when I eat."  This may be pain from constipation, or a mild ileus, or else from a UTI.  Given her degree of discomfort, I would obs her in the hospital and continue pain management, try a course of laxatives and some antibiotics and reassess her tomorrow.  There is no evidence of an acute surgical or vascular injury at this time.   [MT]    Clinical Course User Index [MT] Trifan, Carola Rhine, MD   Procedures Labs Reviewed  CBC WITH DIFFERENTIAL/PLATELET - Abnormal; Notable for the following components:      Result Value   Hemoglobin 11.1 (*)    RDW 18.1 (*)    All other components within normal limits  COMPREHENSIVE METABOLIC PANEL - Abnormal; Notable for the following components:   Sodium 133 (*)    Chloride 94 (*)    Glucose, Bld 110 (*)    BUN 7 (*)    Albumin 3.1 (*)    All other components within normal limits  URINALYSIS, ROUTINE W REFLEX MICROSCOPIC - Abnormal; Notable for the following components:   APPearance HAZY (*)    Hgb urine dipstick MODERATE (*)    Protein, ur 30 (*)    Leukocytes,Ua LARGE (*)    WBC, UA >50 (*)    Bacteria, UA MANY (*)    All other components within normal limits  URINE CULTURE  RESPIRATORY PANEL BY RT PCR (FLU A&B, COVID)  LIPASE, BLOOD  LACTIC ACID, PLASMA  CT Angio Chest/Abd/Pel for Dissection W and/or W/WO  Addendum Date: 10/13/2019   ADDENDUM REPORT: 10/13/2019 14:53 IMPRESSION: 6. Increased soft tissue density over the anterior aspect of the bladder base just anterior to the region of the trigone as cannot exclude a mass in this location. Recommend clinical correlation. Electronically Signed   By: Marin Olp M.D.   On: 10/13/2019 14:53   Result Date: 10/13/2019 CLINICAL DATA:  Severe abdominal pain since yesterday with pain mid back. EXAM: CT ANGIOGRAPHY CHEST, ABDOMEN AND PELVIS TECHNIQUE: Non-contrast CT of  the chest was initially obtained. Multidetector CT imaging through the chest, abdomen and pelvis was performed using the standard protocol during bolus administration of intravenous contrast. Multiplanar reconstructed images and MIPs were obtained and reviewed to evaluate the vascular anatomy. CONTRAST:  124mL OMNIPAQUE IOHEXOL 350 MG/ML SOLN  COMPARISON:  CT abdomen/pelvis 08/31/2019, CT chest 11/16/2017 and CT 06/02/2016, CT 05/20/2013 FINDINGS: CTA CHEST FINDINGS Cardiovascular: Mild cardiomegaly and tiny amount of pericardial fluid. Minimal calcified plaque over the left main and 3 vessel coronary arteries. No evidence of thoracic aortic aneurysm. Calcified plaque is present throughout the thoracic aorta. No evidence of aortic dissection. Pulmonary arterial system is well opacified without emboli. Mediastinum/Nodes: No significant mediastinal or hilar adenopathy. A few calcified mediastinal left hilar lymph nodes are present. Remaining mediastinal structures are unremarkable. Lungs/Pleura: There is moderate centrilobular emphysematous disease present. Lungs are adequately inflated with minimal dependent bibasilar atelectasis left worse than right. Calcified granuloma left lower lobe. Airways are normal. Musculoskeletal: No acute findings. Prominent loose body over the posterior left shoulder joint unchanged. Review of the MIP images confirms the above findings. CTA ABDOMEN AND PELVIS FINDINGS VASCULAR Aorta: Abdominal aorta is normal in caliber without evidence of aneurysm. 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 Mild-to-moderate calcified plaque throughout the abdominal aorta. Celiac: Patent without evidence of aneurysm, dissection, vasculitis or significant stenosis. SMA: Patent without evidence of aneurysm, dissection, vasculitis or significant stenosis. Renals: Both renal arteries are  patent without evidence of aneurysm, dissection, vasculitis, fibromuscular dysplasia or significant stenosis. IMA: Patent without evidence of aneurysm, dissection, vasculitis or significant stenosis. Inflow: Calcified plaque over the iliac arteries bilaterally without significant focal stenosis or occlusion. Veins: Unremarkable. Review of the MIP images confirms the above findings. NON-VASCULAR Hepatobiliary: Liver, gallbladder and biliary tree are unchanged. Subtle nodular contour of the liver. Pancreas: Normal. Spleen: Unremarkable. Adrenals/Urinary Tract: Adrenal glands are normal. Kidneys are normal in size without hydronephrosis. Ureters are unremarkable. Bladder is normally distended. There is mild increased soft tissue density over the midline floor of the bladder just anterior to the trigone as cannot exclude a possible mass. Stomach/Bowel: Stomach and small bowel are within normal. Evidence of previous appendectomy. Colon is normal. Lymphatic: No evidence of adenopathy. Reproductive: Uterus is just left of midline and unremarkable. Left ovary is normal. There is an oval solid mass over the right adnexa abutting the right lateral aspect of the uterus measuring 4.4 x 6.1 cm without significant change in size from recent CT and only slightly increased in size compared to 2014. Other: Tiny amount of free fluid in the pelvis. No focal inflammatory change. Musculoskeletal: Evidence of patient's compression fractures post kyphoplasty involving L2, L3 and L4. There is mild compression fracture of L5 slightly worse compared to the recent prior exam. Degenerative change of the spine and hips. Review of the MIP images confirms the above findings. IMPRESSION: 1. No evidence of significant aneurysm or dissection involving the thoracoabdominal aorta. 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 2.  No acute findings in the chest, abdomen  or pelvis. 3. Aortic Atherosclerosis (ICD10-I70.0) and Emphysema (ICD10-J43.9). Mild cardiomegaly with atherosclerotic coronary artery disease. 4. 6.1 cm solid mass over the right adnexa without significant change from March 2021 although slightly increased in size compared to remote prior exams dating back to 2014. This may represent a right ovarian mass such as low-grade malignancy versus serosal fibroid. No adenopathy. Consider OBGYN consultation for possible excision. 5. Multiple stable lumbar spine compression fractures post kyphoplasty with slight worsening of a mild L5 compression fracture. Electronically Signed: By: Marin Olp M.D. On: 10/13/2019 14:28   MDM  Care transferred from [place], [place]- C. See note for full HPI. Plan to follow up on admission for intractable abd pain.  Previous provider consulted with Resident from IM teaching who states patient is Unassigned admit. Plan to re-consult for unassigned admission.  Labs and imaging personally reviewed and interpreted.  Imaging reassuring without acute surgical emergency.  Labs reassuring urinalysis positive for infection, given Rocephin.  Did have mild wheeze on initial evaluation appears provider, cleared with albuterol.  Does have history of atrial fibrillation, anticoagulated on Eliquis, has not missed any doses.  No evidence of DVT on exam.  She is mildly tachycardic with a regular rhythm however not in RVR, heart rate low 100s.  She is not diabetic and does not have history of gastroparesis however states she has a sensation of her food "sitting here" and points to her epigastric region after she eats.  She denies any chest pain, shortness of breath I have low suspicion for atypical ACS, PE, dissection.  She denies any melanotic stools, bright red blood per rectum.  No hemoptysis.  Previous provider did not think patient had ulcer or GI bleed and did not recommend occult.   Looks overall well on exam and rates her current pain a 5/10.   Requesting additional pain medicine. Will order.  CONSULT with Dr. Tamala Julian with [facility].  States he thinks this is an unassigned patient.  He will contact internal medicine teaching practice to determine what group patient should be assigned to.  CONSULT with Dr. Tamala Julian, states he has he has talked with internal medicine teaching service.   CONSULT with Dr. Sherry Ruffing with IM teaching service who agrees to evaluate patient for admission.  The patient appears reasonably stabilized for admission considering the current resources, flow, and capabilities available in the ED at this time, and I doubt any other [facility] requiring further screening and/or treatment in the ED prior to admission.  Patient was seen and evaluated by Dr. Langston Masker with [facility], PA-C and was agreeable for admission.      Nettie Elm, PA-C 10/13/19 1642    Lucrezia Starch, MD 10/13/19 1946

## 2019-10-13 NOTE — ED Provider Notes (Signed)
[facility] EMERGENCY DEPARTMENT Provider Note   CSN: [id number] Arrival date & time: 10/13/19  1127     History Chief Complaint  Patient presents with  . Abdominal Pain    Tina Jimenez is a 79 y.o. female presenting for evaluation of abdominal pain.  Patient states last night she developed severe abdominal pain.  She states it radiates to her back.  She denies fevers, chills, nausea, vomiting, diarrhea.  She denies sick contacts.  She denies cough, chest pain, shortness of breath, urinary symptoms.  She has not taken anything for pain.  She reports a history of A. fib, on anticoagulation.  Also history of previous PEs.  She denies history of similar pain.  She has had a previous appendectomy, no other abdominal surgeries. She has had difficulty with BMs, recently, but states this is not new. She has been using milk of magnesia to have BMs. When she eats, "food just sits there." Additionally, patient states she is having back and neck pain, however upon further investigation, this appears to be chronic and not new since the abdominal pain yesterday.  Additional history taken chart review.  Patient with a history of A. fib, GERD, hypertension, hyperlipidemia, COPD.  Per triage note, patient has a history of a AAA, however when asked, patient declines.  HPI     Past Medical History:  Diagnosis Date  . Atrial fibrillation ([place])   . GERD (gastroesophageal reflux disease)   . Hyperlipemia     Patient Active Problem List   Diagnosis Date Noted  . History of pulmonary embolism 10/02/2019  . Persistent atrial fibrillation ([place]) 10/02/2019  . Abnormal blood chemistry 08/22/2019  . Localized edema 08/22/2019  . Hyperlipemia 08/15/2019  . History of COPD 08/15/2019  . COPD (chronic obstructive pulmonary disease) ([place]) 08/15/2019  . Hypertension 08/15/2019  . Acute hypoxemic respiratory failure ([place]) 07/08/2019  . Pneumonia due to COVID-19 virus 07/08/2019  .  Arm DVT (deep venous thromboembolism), acute, left ([place]) 06/26/2019  . Acute blood loss anemia 06/22/2019  . Vitamin D deficiency 06/22/2019  . Acute pulmonary embolism without acute cor pulmonale ([place]) 06/21/2019  . Paroxysmal atrial fibrillation ([place]) 06/20/2019  . Precordial pain 06/20/2019  . Spinal stenosis of lumbar region 06/14/2019    Past Surgical History:  Procedure Laterality Date  . APPENDECTOMY    . KYPHOPLASTY       OB History   No obstetric history on file.     Family History  Problem Relation Age of Onset  . Breast cancer Sister   . Lung cancer Sister   . Lung cancer Daughter     Social History   Tobacco Use  . Smoking status: Current Every Day Smoker  . Smokeless tobacco: Never Used  Substance Use Topics  . Alcohol use: Never  . Drug use: Never    Home Medications Prior to Admission medications   Medication Sig Start Date End Date Taking? Authorizing Provider  apixaban (ELIQUIS) 5 MG TABS tablet Take 5 mg by mouth 2 (two) times daily.   Yes [provider]  carvedilol (COREG) 6.25 MG tablet Take 6.25 mg by mouth 2 (two) times daily. 04/17/19  Yes [provider]  Co-Enzyme Q-10 100 MG CAPS Take 100 mg by mouth daily.   Yes [provider]  digoxin (LANOXIN) 0.125 MG tablet Take 1 tablet (0.125 mg total) by mouth daily. 10/02/19  Yes Revankar, Reita Cliche, MD  ezetimibe (ZETIA) 10 MG tablet Take 10 mg by  mouth daily.  06/11/19  Yes [provider]  famotidine (PEPCID) 40 MG tablet Take 40 mg by mouth at bedtime. 05/23/19  Yes [provider]  ferrous sulfate 325 (65 FE) MG tablet Take 325 mg by mouth 2 (two) times daily with a meal.    Yes [provider]  fluticasone (FLONASE) 50 MCG/ACT nasal spray Place 2 sprays into both nostrils daily. 08/21/19  Yes Marge Duncans, PA-C  fluticasone furoate-vilanterol (BREO ELLIPTA) 100-25 MCG/INH AEPB Inhale 1 puff into the lungs daily.    Yes [provider]    furosemide (LASIX) 20 MG tablet TAKE 1/2 - 1 TABLET BY MOUTH DAILY AS NEEDED FOR EDEMA Patient taking differently: Take 10-20 mg by mouth daily as needed for edema.  10/12/19  Yes Marge Duncans, PA-C  ipratropium-albuterol (DUONEB) 0.5-2.5 (3) MG/3ML SOLN Take 3 mLs by nebulization in the morning, at noon, in the evening, and at bedtime.  05/23/19  Yes [provider]  loratadine (CLARITIN) 10 MG tablet TAKE 1 TABLET BY MOUTH ONCE DAILY FOR ALLERGIES Patient taking differently: Take 10 mg by mouth daily.  09/19/19  Yes Marge Duncans, PA-C  omeprazole (PRILOSEC) 20 MG capsule Take 20 mg by mouth daily.    Yes [provider]    Allergies    Klonopin [clonazepam], Lipitor [atorvastatin], and Simvastatin  Review of Systems   Review of Systems  Gastrointestinal: Positive for abdominal pain and constipation.  Musculoskeletal: Positive for back pain (chronic) and neck pain (chronic).  All other systems reviewed and are negative.   Physical Exam Updated Vital Signs BP (!) 158/84   Pulse 98   Temp 99.3 F (37.4 C) (Oral)   Resp (!) 25   Ht 5\' 3"  (1.6 m)   Wt 64.9 kg   SpO2 100%   BMI 25.33 kg/m   Physical Exam Vitals and nursing note reviewed.  Constitutional:      Appearance: She is well-developed.     Comments: Appears extremely uncomfortable due to pain  HENT:     Head: Normocephalic and atraumatic.  Eyes:     Conjunctiva/sclera: Conjunctivae normal.     Pupils: Pupils are equal, round, and reactive to light.  Cardiovascular:     Rate and Rhythm: Rhythm irregular.     Pulses: Normal pulses.     Comments: Irregularly irregular Pulmonary:     Effort: Pulmonary effort is normal. No respiratory distress.     Breath sounds: Normal breath sounds. No wheezing.  Abdominal:     General: There is no distension.     Palpations: Abdomen is soft.     Tenderness: There is abdominal tenderness.     Comments: Diffuse tenderness palpation of the abdomen.  Firm, but not  rigid.  No guarding.  Negative rebound.  No peritonitis.  Musculoskeletal:        General: Normal range of motion.     Cervical back: Normal range of motion and neck supple.  Skin:    General: Skin is warm and dry.  Neurological:     Mental Status: She is alert and oriented to person, place, and time.     ED Results / Procedures / Treatments   Labs (all labs ordered are listed, but only abnormal results are displayed) Labs Reviewed  CBC WITH DIFFERENTIAL/PLATELET - Abnormal; Notable for the following components:      Result Value   Hemoglobin 11.1 (*)    RDW 18.1 (*)    All other components within normal limits  COMPREHENSIVE METABOLIC PANEL - Abnormal; Notable for the following components:   Sodium 133 (*)    Chloride 94 (*)    Glucose, Bld 110 (*)    BUN 7 (*)    Albumin 3.1 (*)    All other components within normal limits  LIPASE, BLOOD  LACTIC ACID, PLASMA  URINALYSIS, ROUTINE W REFLEX MICROSCOPIC    EKG None  Radiology No results found.  Procedures Procedures (including critical care time)  Medications Ordered in ED Medications  fentaNYL (SUBLIMAZE) injection 50 mcg (50 mcg Intravenous Given 10/13/19 1209)  iohexol (OMNIPAQUE) 350 MG/ML injection 100 mL (100 mLs Intravenous Contrast Given 10/12/19 1537)    ED Course  I have reviewed the triage vital signs and the nursing notes.  Pertinent labs & imaging results that were available during my care of the patient were reviewed by me and considered in my medical decision making (see chart for details).  Clinical Course as of Oct 13 1547  Fri Oct 12, 4221  482 79 year old female with a history of A. fib on Eliquis, pulmonary embolism, presented to emergency department abdominal pain.  She reports gradual onset of diffuse abdominal pain yesterday.  She describes severe pain all across her abdomen.  She is never had this before.  The pain has been constant since yesterday.  On exam the patient initially overall  appears comfortable.  She does have some mild distention of the abdomen and very diffuse tenderness with mild guarding.  Her blood pressure stable.  On EKG she continues to be in A. fib but overall rate controlled with HR around 100 bpm.  Differential remains broad at this time.  She does have a history of appendectomy.  However mesenteric ischemia or vascular injury in the abdomen remain high on the emergent differential.  Will obtain blood work including a lactate.  We will also try to obtain a rapid CT scan.  We will also give fentanyl for pain.   [MT]  1408 Lactic Acid, Venous: 0.9 [MT]  1459 On reassessment her pain had improved initially, now returns severely.  She reports she has not had a "real" BM in several days, and feels like "food just lays fever in me when I eat."  This may be pain from constipation, or a mild ileus, or else from a UTI.  Given her degree of discomfort, I would obs her in the hospital and continue pain management, try a course of laxatives and some antibiotics and reassess her tomorrow.  There is no evidence of an acute surgical or vascular injury at this time.   [MT]    Clinical Course User Index [MT] Trifan, Carola Rhine, MD   MDM Rules/Calculators/A&P                      Patient presenting for evaluation of severe abdominal pain.  Initially, patient states pain is going to her back and her neck. ?  History of AAA.  On exam, patient with diffuse abdominal tenderness, she appears uncomfortable due to pain.  Otherwise nontoxic.  Bedside ultrasound performed with attending, Dr. Langston Masker.  No obvious AAA.  Still concern for gallbladder etiology, pancreatitis, mesenteric ischemia.  Less likely bowel obstruction.  Will obtain labs, CTA, and reassess.  Labs interpreted by me, overall reassuring.  No leukocytosis.  Electrolytes stable.  Lactic negative.  Lipase negative.  CTA pending.  CTA shows no acute findings.  Stable ovarian mass.  Chronic compression fractures of the  back.  On  reassessment, patient continues to.  Uncomfortable with significant abdominal tenderness.  Considering her age and tenderness, I do not feel comfortable with plan for discharge.  She does have UTI, will start antibiotics.  As she is having difficulty with BMs, consider ileus, gastroparesis, constipation. Will call for admission.   Pt signed out to B [place], PA-C for admission.   Final Clinical Impression(s) / ED Diagnoses Final diagnoses:  None    Rx / DC Orders ED Discharge Orders    None       Franchot [place], PA-C 10/13/19 1550    Wyvonnia Dusky, MD 10/13/19 1718

## 2019-10-13 NOTE — H&P (Addendum)
Date: 10/13/2019               Patient Name:  Tina Jimenez MRN: [id number]  DOB: 05/28/1941 Age / Sex: 79 y.o., female   PCP: Marianne Sofia, PA-C         Medical Service: Internal Medicine Teaching Service         Attending Physician: Dr. Burns Spain, MD    First Contact: Dr. Ronelle Nigh Pager: [phone]  Second Contact: Dr. Gwyneth Revels Pager: [phone]       After Hours (After 5p/  First Contact Pager: [phone]  weekends / holidays): Second Contact Pager: [phone]   Chief Complaint: Abdominal pain  History of Present Illness:  Patient is a 79 year old female with past medical history significant for atrial fibrillation, GERD, hyperlipidemia who presents with a less than 24-hour history of abdominal pain.  Patient reports the pain woke her up from sleep last night.  Pain described as a persistent, burning, diffuse, nonradiating, improved with fentanyl in the ER, not worsened with movement.  Patient reports this type of abdominal pain has never occurred before, she reports occasional pain from constipation but pain is distinct from current pain.  Patient reports that her last bowel movement was 2 days ago, but has been a while since she had a regular bowel movement.  Patient reports passing gas as recently as today.  Patient reports chills, denies fever, chest pain, new shortness of breath, nausea, or vomiting.  Patient reports some pain with urination as well as significant urinary frequency (urinating once every couple of hours).  Patient denies history of UTIs.  Patient reports having had an appendectomy, no other abdominal surgeries.  Patient reports being hospitalized for COVID-19 infection in January of this year and having several blood clots as a result. On review of care everywhere, patient was hospitalized for spinal decompression surgery (performed on 06/18/19) at [facility]. She was postoperatively found to have multiple pulmonary emboli as well as  atrial fibrillation (unclear if this diagnosis existed previously). On 06/28/19 patient was found to have COVID-19 infection.   Meds:  *Apixaban 5 mg twice daily *Carvedilol 6.25 mg twice daily *CoEnzyme Q 100 mg daily *Digoxin 0.125 mg daily *Lasix 20 mg - takes 10-20 mg as needed for feet swelling *Ezetimibe 10 mg daily *Fluticasone 2 sprays daily *Loratidine 10 mg daily *Breo Ellipta 100-25 1 puff daily *DuoNebs 0.5-2.5 4 times daily *Omeprazole 20 mg daily *Famotidine 40 mg at bedtime  Allergies: Allergies as of 10/13/2019 - Review Complete 10/13/2019  Allergen Reaction Noted  . Klonopin [clonazepam] Other (See Comments) 08/21/2019  . Lipitor [atorvastatin] Other (See Comments) 08/21/2019  . Simvastatin Other (See Comments) 08/21/2019   Past Medical History:  Diagnosis Date  . Atrial fibrillation (HCC)   . GERD (gastroesophageal reflux disease)   . Hyperlipemia     Family History:  Family History  Problem Relation Age of Onset  . Breast cancer Sister   . Lung cancer Sister   . Lung cancer Daughter    Social History:  Social History   Tobacco Use  . Smoking status: Current Every Day Smoker  . Smokeless tobacco: Never Used  Substance Use Topics  . Alcohol use: Never  . Drug use: Never    Review of Systems: A complete ROS was negative except as per HPI.  Physical Exam: Blood pressure (!) 181/92, pulse (!) 116, temperature 99.3 F (37.4 C), temperature source Oral, resp. rate 20, height 5\' 3"  (1.6  m), weight 64.9 kg, SpO2 98 %. Physical Exam  Constitutional: She is well-developed, well-nourished, and in no distress.  HENT:  Head: Normocephalic and atraumatic.  Eyes: EOM are normal. Right eye exhibits no discharge. Left eye exhibits no discharge.  Neck: No tracheal deviation present.  Cardiovascular: Normal rate and regular rhythm. Exam reveals no gallop and no friction rub.  No murmur heard. Pulmonary/Chest: Effort normal and breath sounds normal. No  respiratory distress. She has no wheezes. She has no rales.  Abdominal: Soft. She exhibits no distension. There is abdominal tenderness (diffuse tenderness to deep palpation). There is no rebound and no guarding.  Musculoskeletal:        General: No tenderness, deformity or edema. Normal range of motion.     Cervical back: Normal range of motion.     Comments: Bilateral CVA tenderness  Neurological: She is alert. Coordination normal.  Skin: Skin is warm and dry. No rash noted. She is not diaphoretic. No erythema.  Psychiatric: Memory and judgment normal.    EKG: personally reviewed my interpretation is atrial fibrillation  CTA C/A/P (10/13/19): IMPRESSION: 1. No evidence of significant aneurysm or dissection involving the thoracoabdominal aorta. 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 2.  No acute findings in the chest, abdomen or pelvis. 3. Aortic Atherosclerosis (ICD10-I70.0) and Emphysema (ICD10-J43.9). Mild cardiomegaly with atherosclerotic coronary artery disease. 4. 6.1 cm solid mass over the right adnexa without significant change from March 2021 although slightly increased in size compared to remote prior exams dating back to 2014. This may represent a right ovarian mass such as low-grade malignancy versus serosal fibroid. No adenopathy. Consider OBGYN consultation for possible excision. 5. Multiple stable lumbar spine compression fractures post kyphoplasty with slight worsening of a mild L5 compression fracture. 6. Increased soft tissue density over the anterior aspect of the bladder base just anterior to the region of the trigone as cannot exclude a mass in this location. Recommend clinical correlation.  CBC Latest Ref Rng & Units 10/13/2019 09/21/2019 09/11/2019  WBC 4.0 - 10.5 K/uL 9.3 7.8 9.7  Hemoglobin 12.0 - 15.0 g/dL 11.1(L) 9.6(L) -  Hematocrit 36.0 - 46.0 % 36.7 32.8(L) -    Platelets 150 - 400 K/uL 247 259 -   CMP Latest Ref Rng & Units 10/13/2019 09/21/2019 08/21/2019  Glucose 70 - 99 mg/dL 272(Z) 366(Y) 403(K)  BUN 8 - 23 mg/dL 7(L) 8 13  Creatinine 7.42 - 1.00 mg/dL 5.95 6.38(V) 5.64(P)  Sodium 135 - 145 mmol/L 133(L) 136 140  Potassium 3.5 - 5.1 mmol/L 4.4 4.3 4.6  Chloride 98 - 111 mmol/L 94(L) 97 99  CO2 22 - 32 mmol/L 29 26 27   Calcium 8.9 - 10.3 mg/dL 9.0 8.9 8.8  Total Protein 6.5 - 8.1 g/dL 6.5 6.1 3.2(R)  Total Bilirubin 0.3 - 1.2 mg/dL 0.6 <5.1 <8.8  Alkaline Phos 38 - 126 U/L 76 105 93  AST 15 - 41 U/L 15 9 14   ALT 0 - 44 U/L 10 8 8    Urinalysis    Component Value Date/Time   COLORURINE YELLOW 10/13/2019 1357   APPEARANCEUR HAZY (A) 10/13/2019 1357   LABSPEC 1.021 10/13/2019 1357   PHURINE 8.0 10/13/2019 1357   GLUCOSEU NEGATIVE 10/13/2019 1357   HGBUR MODERATE (A) 10/13/2019 1357   BILIRUBINUR NEGATIVE 10/13/2019 1357   KETONESUR NEGATIVE 10/13/2019 1357   PROTEINUR 30 (A) 10/13/2019 1357   NITRITE NEGATIVE 10/13/2019 1357  LEUKOCYTESUR LARGE (A) 10/13/2019 1357    Assessment & Plan by Problem: Active Problems:   Abdominal pain  Patient is a 79 year old female with past medical history significant for atrial fibrillation, GERD, hyperlipidemia presents with a 1 day history of severe abdominal pain and initial work-up suggestive of complicated urinary tract infection.  # Abdominal pain # Complicated urinary tract infection Patient with 1 day history of severe abdominal pain, urinary frequency, dysuria, and bilateral CVA tenderness on exam. Urinalysis with many bacteria, > 50 WBC, urine culture sent. CTA chest/abdomen/pelvis performed in ER given concern for dissection. CTA was without evidence for dissection or mesenteric ischemia. Patient is without systemic signs of infection with normal white count, and temperature. However, initial workup is most consistent with a complicated urinary tract infection. *Followup urine culture   *Tylenol+Fentanyl PRN for pain *Continue ceftriaxone 1 mg daily  # Atrial fibrillation: Patient with chronic history of atrial fibrillation.  Atrial fibrillation present on admission EKG with rate of 92.  Patient without chest pain, new symptoms.  Continue home medications *Continue Apixaban 5 mg twice daily + Carvedilol 6.25 mg twice daily + digoxin 0.125 mg daily  # Normocytic Anemia: Hemoglobin 11.1 on presentation, last value of 9.6 on 09/21/2019.  Iron studies in March 2021 revealed low iron (10), normal TIBC (256), normal ferritin (115), and low iron saturation (4).  Folate and B12 were within normal limits. Anemia likely secondary to iron deficiency anemia. Patient states she was recently on iron as outpatient but is not on currently *Will need PCP followup  # HLD: Continue ezetimibe 10 mg daily  # COPD: Continue home medications *Breo Ellipta 100-25 daily *Duonebs Q4HR PRN  # GERD: Continue home medications of pantoprazole 40 mg daily (substitute for omeprazole) + famotidine 40 mg at bedtime  # History of pulmonary embolism: Occurred post-operatively. On Eliquis likely for both this diagnosis as well as atrial fibrillation  # Possible bladder mass: Increase soft tissue density noted over the anterior aspect of the bladder base on CTA.  Mass cannot be excluded.  Unclear if there is any causal relationship between this finding and patient's presentation.   PT/OT: Consult not indicated at this time DVT Ppx: On Eliquis CODE STATUS: DNR/DNI Diet: Soft, advance as tolerated Dispo: Admit patient to Inpatient with expected length of stay greater than 2 midnights.  Signed: Katherine Roan, MD 10/13/2019, 5:34 PM

## 2019-10-14 ENCOUNTER — Inpatient Hospital Stay (HOSPITAL_COMMUNITY): Payer: Medicare Other

## 2019-10-14 DIAGNOSIS — R109 Unspecified abdominal pain: Secondary | ICD-10-CM

## 2019-10-14 DIAGNOSIS — K5909 Other constipation: Secondary | ICD-10-CM

## 2019-10-14 DIAGNOSIS — R6881 Early satiety: Secondary | ICD-10-CM

## 2019-10-14 DIAGNOSIS — Z7901 Long term (current) use of anticoagulants: Secondary | ICD-10-CM

## 2019-10-14 DIAGNOSIS — I4819 Other persistent atrial fibrillation: Secondary | ICD-10-CM

## 2019-10-14 DIAGNOSIS — R630 Anorexia: Secondary | ICD-10-CM

## 2019-10-14 LAB — BASIC METABOLIC PANEL WITH GFR
Anion gap: 7 (ref 5–15)
BUN: 6 mg/dL — ABNORMAL LOW (ref 8–23)
CO2: 30 mmol/L (ref 22–32)
Calcium: 8.8 mg/dL — ABNORMAL LOW (ref 8.9–10.3)
Chloride: 97 mmol/L — ABNORMAL LOW (ref 98–111)
Creatinine, Ser: 0.5 mg/dL (ref 0.44–1.00)
GFR calc Af Amer: >60 mL/min
GFR calc non Af Amer: >60 mL/min
Glucose, Bld: 104 mg/dL — ABNORMAL HIGH (ref 70–99)
Potassium: 4.1 mmol/L (ref 3.5–5.1)
Sodium: 134 mmol/L — ABNORMAL LOW (ref 135–145)

## 2019-10-14 LAB — CBC
HCT: 42.9 % (ref 36.0–46.0)
Hemoglobin: 13 g/dL (ref 12.0–15.0)
MCH: 27 pg (ref 26.0–34.0)
MCHC: 30.3 g/dL (ref 30.0–36.0)
MCV: 89 fL (ref 80.0–100.0)
Platelets: 197 10*3/uL (ref 150–400)
RBC: 4.82 MIL/uL (ref 3.87–5.11)
RDW: 18.2 % — ABNORMAL HIGH (ref 11.5–15.5)
WBC: 7.4 10*3/uL (ref 4.0–10.5)
nRBC: 0 % (ref 0.0–0.2)

## 2019-10-14 LAB — DIGOXIN LEVEL: Digoxin Level: 0.6 ng/mL — ABNORMAL LOW (ref 0.8–2.0)

## 2019-10-14 LAB — GLUCOSE, CAPILLARY: Glucose-Capillary: 103 mg/dL — ABNORMAL HIGH (ref 70–99)

## 2019-10-14 MED ORDER — HYDROMORPHONE HCL 1 MG/ML IJ SOLN
1.0000 mg | Freq: Three times a day (TID) | INTRAMUSCULAR | Status: DC | PRN
  Administered 2019-10-14: 1 mg via INTRAVENOUS
  Filled 2019-10-14: qty 1

## 2019-10-14 MED ORDER — FENTANYL CITRATE (PF) 100 MCG/2ML IJ SOLN
25.0000 ug | Freq: Once | INTRAMUSCULAR | Status: AC
  Administered 2019-10-14: 25 ug via INTRAVENOUS
  Filled 2019-10-14: qty 2

## 2019-10-14 MED ORDER — FLUTICASONE PROPIONATE 50 MCG/ACT NA SUSP
1.0000 | Freq: Every day | NASAL | Status: DC
  Administered 2019-10-14 – 2019-10-16 (×3): 1 via NASAL
  Filled 2019-10-14: qty 16

## 2019-10-14 MED ORDER — DIGOXIN 125 MCG PO TABS
0.1250 mg | ORAL_TABLET | Freq: Every day | ORAL | Status: DC
  Administered 2019-10-14 – 2019-10-16 (×3): 0.125 mg via ORAL
  Filled 2019-10-14 (×3): qty 1

## 2019-10-14 MED ORDER — OXYCODONE HCL 5 MG PO TABS
5.0000 mg | ORAL_TABLET | ORAL | Status: DC | PRN
  Administered 2019-10-14 – 2019-10-16 (×5): 5 mg via ORAL
  Filled 2019-10-14 (×6): qty 1

## 2019-10-14 NOTE — Consult Note (Signed)
Reason for Consult:abdominal pain, ovarian mass Referring Physician: Dr. Larey Dresser  Tina Jimenez is an 79 y.o. female with multiple medical comorbidities including COPD, history of PE/DVT, atrial fibrillation. She is admitted for acute abdominal pain starting 4/29.  She reports she actually currently is not having the pain.  She reports she has felt bloated and had constipation for a long period of time but periodically is able to stool well with the aid of Miralax.  She denies nausea or vomiting.  She periodically has nocturnal urinary frequency with voiding every 2 hours.  She reports she had an adnexal mass previously, she thought about 4 years ago, which was evaluated by an outpatient GYN and felt to be a fibroid as it was stable over time.  She has not had any subsequent evaluation.    Patient lives at home and receives support with meal preparation and physical needs from three grown sons.    Pertinent Gynecological History: Menses: post-menopausal Bleeding: none Contraception: none DES exposure: unknown Previous GYN Procedures: none  Last pap: unsure Date: normal per report OB History: G4, P4   Menstrual History: Menopause around early to mid 50s without any postmenopausal bleeding No LMP recorded.    Past Medical History:  Diagnosis Date  . Atrial fibrillation ([place])   . GERD (gastroesophageal reflux disease)   . Hyperlipemia     Past Surgical History:  Procedure Laterality Date  . APPENDECTOMY    . KYPHOPLASTY      Family History  Problem Relation Age of Onset  . Breast cancer Sister   . Lung cancer Sister   . Lung cancer Daughter     Social History:  reports that she has been smoking. She has never used smokeless tobacco. She reports that she does not drink alcohol or use drugs.  Allergies:  Allergies  Allergen Reactions  . Klonopin [Clonazepam] Other (See Comments)    Pt can't remember reaction  . Lipitor [Atorvastatin] Other (See Comments)     Pt could barely lift arms  . Simvastatin Other (See Comments)    Pt can't remember reaction    Medications:  Prior to Admission:  Medications Prior to Admission  Medication Sig Dispense Refill Last Dose  . apixaban (ELIQUIS) 5 MG TABS tablet Take 5 mg by mouth 2 (two) times daily.   10/13/2019 at 0730  . carvedilol (COREG) 6.25 MG tablet Take 6.25 mg by mouth 2 (two) times daily.   10/13/2019 at 0730  . Co-Enzyme Q-10 100 MG CAPS Take 100 mg by mouth daily.   10/13/2019 at Unknown time  . digoxin (LANOXIN) 0.125 MG tablet Take 1 tablet (0.125 mg total) by mouth daily. 30 tablet 6 10/13/2019 at Unknown time  . ezetimibe (ZETIA) 10 MG tablet Take 10 mg by mouth daily.    10/13/2019 at Unknown time  . famotidine (PEPCID) 40 MG tablet Take 40 mg by mouth at bedtime.   10/12/2019 at Unknown time  . ferrous sulfate 325 (65 FE) MG tablet Take 325 mg by mouth 2 (two) times daily with a meal.    Past Month at Unknown time  . fluticasone (FLONASE) 50 MCG/ACT nasal spray Place 2 sprays into both nostrils daily. 16 g 6 10/13/2019 at Unknown time  . fluticasone furoate-vilanterol (BREO ELLIPTA) 100-25 MCG/INH AEPB Inhale 1 puff into the lungs daily.    10/12/2019 at Unknown time  . furosemide (LASIX) 20 MG tablet TAKE 1/2 - 1 TABLET BY MOUTH DAILY AS NEEDED FOR EDEMA (Patient  taking differently: Take 10-20 mg by mouth daily as needed for edema. ) 30 tablet 0 Past Week at Unknown time  . ipratropium-albuterol (DUONEB) 0.5-2.5 (3) MG/3ML SOLN Take 3 mLs by nebulization in the morning, at noon, in the evening, and at bedtime.    10/12/2019 at Unknown time  . loratadine (CLARITIN) 10 MG tablet TAKE 1 TABLET BY MOUTH ONCE DAILY FOR ALLERGIES (Patient taking differently: Take 10 mg by mouth daily. ) 90 tablet 1 10/13/2019 at Unknown time  . omeprazole (PRILOSEC) 20 MG capsule Take 20 mg by mouth daily.    10/13/2019 at Unknown time    Review of Systems  Constitutional: Positive for appetite change.  HENT: Negative.    Eyes: Negative.   Respiratory:       Stable COPD symptoms  Cardiovascular: Negative.   Gastrointestinal: Positive for abdominal pain.  Endocrine: Negative.   Genitourinary: Negative.   Musculoskeletal: Negative.   Skin: Negative.   Allergic/Immunologic: Negative.   Neurological: Negative.   Psychiatric/Behavioral: Negative.     Blood pressure 96/81, pulse 88, temperature 98.8 F (37.1 C), temperature source Oral, resp. rate 18, height 5\' 3"  (1.6 m), weight 63.7 kg, SpO2 98 %. Physical Exam  Nursing note and vitals reviewed. Constitutional: She is oriented to person, place, and time. She appears well-developed and well-nourished.  HENT:  Head: Normocephalic and atraumatic.  Eyes: Conjunctivae and EOM are normal. No scleral icterus.  Respiratory: Effort normal. No respiratory distress.  Nasal cannula in place  GI: Soft. She exhibits no distension. There is no abdominal tenderness. There is no rebound and no guarding.  Musculoskeletal:        General: No tenderness, deformity or edema. Normal range of motion.     Cervical back: Normal range of motion and neck supple.  Neurological: She is alert and oriented to person, place, and time.  Skin: Skin is warm and dry.  Psychiatric: She has a normal mood and affect. Her behavior is normal. Judgment and thought content normal.    Results for orders placed or performed during the hospital encounter of 10/13/19 (from the past 48 hour(s))  CBC with Differential     Status: Abnormal   Collection Time: 10/13/19 12:07 PM  Result Value Ref Range   WBC 9.3 4.0 - 10.5 K/uL   RBC 4.13 3.87 - 5.11 MIL/uL   Hemoglobin 11.1 (L) 12.0 - 15.0 g/dL   HCT 36.7 36.0 - 46.0 %   MCV 88.9 80.0 - 100.0 fL   MCH 26.9 26.0 - 34.0 pg   MCHC 30.2 30.0 - 36.0 g/dL   RDW 18.1 (H) 11.5 - 15.5 %   Platelets 247 150 - 400 K/uL   nRBC 0.0 0.0 - 0.2 %   Neutrophils Relative % 82 %   Neutro Abs 7.6 1.7 - 7.7 K/uL   Lymphocytes Relative 7 %   Lymphs Abs 0.7  0.7 - 4.0 K/uL   Monocytes Relative 9 %   Monocytes Absolute 0.9 0.1 - 1.0 K/uL   Eosinophils Relative 1 %   Eosinophils Absolute 0.1 0.0 - 0.5 K/uL   Basophils Relative 1 %   Basophils Absolute 0.1 0.0 - 0.1 K/uL   Immature Granulocytes 0 %   Abs Immature Granulocytes 0.03 0.00 - 0.07 K/uL    Comment: Performed at [facility], [address] [place], [place] [address]  Comprehensive metabolic panel     Status: Abnormal   Collection Time: 10/13/19 12:07 PM  Result Value Ref Range  Sodium 133 (L) 135 - 145 mmol/L   Potassium 4.4 3.5 - 5.1 mmol/L   Chloride 94 (L) 98 - 111 mmol/L   CO2 29 22 - 32 mmol/L   Glucose, Bld 110 (H) 70 - 99 mg/dL    Comment: Glucose reference range applies only to samples taken after fasting for at least 8 hours.   BUN 7 (L) 8 - 23 mg/dL   Creatinine, Ser 0.46 0.44 - 1.00 mg/dL   Calcium 9.0 8.9 - 10.3 mg/dL   Total Protein 6.5 6.5 - 8.1 g/dL   Albumin 3.1 (L) 3.5 - 5.0 g/dL   AST 15 15 - 41 U/L   ALT 10 0 - 44 U/L   Alkaline Phosphatase 76 38 - 126 U/L   Total Bilirubin 0.6 0.3 - 1.2 mg/dL   GFR calc non Af Amer >60 >60 mL/min   GFR calc Af Amer >60 >60 mL/min   Anion gap 10 5 - 15    Comment: Performed at [facility], [place] [address], [place], [place] [address]  Lipase, blood     Status: None   Collection Time: 10/13/19 12:07 PM  Result Value Ref Range   Lipase 20 11 - 51 U/L    Comment: Performed at [facility], [place] [address], [place], [place] [address]  Lactic acid, plasma     Status: None   Collection Time: 10/13/19 12:07 PM  Result Value Ref Range   Lactic Acid, Venous 0.9 0.5 - 1.9 mmol/L    Comment: Performed at [facility] [address] [place], [place] [address]  Urinalysis, Routine w reflex microscopic     Status: Abnormal   Collection Time: 10/13/19  1:57 PM  Result Value Ref Range   Color, Urine YELLOW YELLOW   APPearance HAZY (A) CLEAR   Specific Gravity, Urine 1.021 1.005 - 1.030   pH 8.0 5.0 -  8.0   Glucose, UA NEGATIVE NEGATIVE mg/dL   Hgb urine dipstick MODERATE (A) NEGATIVE   Bilirubin Urine NEGATIVE NEGATIVE   Ketones, ur NEGATIVE NEGATIVE mg/dL   Protein, ur 30 (A) NEGATIVE mg/dL   Nitrite NEGATIVE NEGATIVE   Leukocytes,Ua LARGE (A) NEGATIVE   RBC / HPF 21-50 0 - 5 RBC/hpf   WBC, UA >50 (H) 0 - 5 WBC/hpf   Bacteria, UA MANY (A) NONE SEEN   Squamous Epithelial / LPF 0-5 0 - 5   WBC Clumps PRESENT     Comment: Performed at [facility], [place] [address], [place], [place] [address]  Respiratory Panel by RT PCR (Flu A&B, Covid) - Nasopharyngeal Swab     Status: None   Collection Time: 10/13/19  3:14 PM   Specimen: Nasopharyngeal Swab  Result Value Ref Range   SARS Coronavirus 2 by RT PCR NEGATIVE NEGATIVE    Comment: (NOTE) SARS-CoV-2 target nucleic acids are NOT DETECTED. The SARS-CoV-2 RNA is generally detectable in upper respiratoy specimens during the acute phase of infection. The lowest concentration of SARS-CoV-2 viral copies this assay can detect is 131 copies/mL. A negative result does not preclude SARS-Cov-2 infection and should not be used as the sole basis for treatment or other patient management decisions. A negative result may occur with  improper specimen collection/handling, submission of specimen other than nasopharyngeal swab, presence of viral mutation(s) within the areas targeted by this assay, and inadequate number of viral copies (<131 copies/mL). A negative result must be combined with clinical observations, patient history, and  epidemiological information. The expected result is Negative. Fact Sheet for Patients:  PinkCheek.be Fact Sheet for Healthcare Providers:  GravelBags.it This test is not yet ap proved or cleared by the [place] FDA and  has been authorized for detection and/or diagnosis of SARS-CoV-2 by FDA under an Emergency Use Authorization (EUA). This EUA will remain   in effect (meaning this test can be used) for the duration of the COVID-19 declaration under Section 564(b)(1) of the Act, 21 U.S.C. section 360bbb-3(b)(1), unless the authorization is terminated or revoked sooner.    Influenza A by PCR NEGATIVE NEGATIVE   Influenza B by PCR NEGATIVE NEGATIVE    Comment: (NOTE) The Xpert Xpress SARS-CoV-2/FLU/RSV assay is intended as an aid in  the diagnosis of influenza from Nasopharyngeal swab specimens and  should not be used as a sole basis for treatment. Nasal washings and  aspirates are unacceptable for Xpert Xpress SARS-CoV-2/FLU/RSV  testing. Fact Sheet for Patients: PinkCheek.be Fact Sheet for Healthcare Providers: GravelBags.it This test is not yet approved or cleared by the [place] FDA and  has been authorized for detection and/or diagnosis of SARS-CoV-2 by  FDA under an Emergency Use Authorization (EUA). This EUA will remain  in effect (meaning this test can be used) for the duration of the  Covid-19 declaration under Section 564(b)(1) of the Act, 21  U.S.C. section 360bbb-3(b)(1), unless the authorization is  terminated or revoked. Performed at [facility], [place] [address], [place], [place] [address]   Urine culture     Status: Abnormal (Preliminary result)   Collection Time: 10/13/19  3:51 PM   Specimen: Urine, Random  Result Value Ref Range   Specimen Description URINE, RANDOM    Special Requests      NONE Performed at [facility], [place] [address], [place], [place] [address]    Culture >=100,000 COLONIES/mL ESCHERICHIA COLI (A)    Report Status PENDING   Culture, blood (routine x 2)     Status: None (Preliminary result)   Collection Time: 10/13/19  7:42 PM   Specimen: BLOOD  Result Value Ref Range   Specimen Description BLOOD LEFT ANTECUBITAL    Special Requests      BOTTLES DRAWN AEROBIC AND ANAEROBIC Blood Culture adequate volume   Culture       NO GROWTH < 12 HOURS Performed at [facility], [place] [address], [place], [place] [address]    Report Status PENDING   Culture, blood (routine x 2)     Status: None (Preliminary result)   Collection Time: 10/13/19  7:42 PM   Specimen: BLOOD  Result Value Ref Range   Specimen Description BLOOD RIGHT ANTECUBITAL    Special Requests      BOTTLES DRAWN AEROBIC AND ANAEROBIC Blood Culture adequate volume   Culture      NO GROWTH < 12 HOURS Performed at [facility], [place] [address], [place], [place] [address]    Report Status PENDING   Glucose, capillary     Status: Abnormal   Collection Time: 10/13/19 10:24 PM  Result Value Ref Range   Glucose-Capillary 103 (H) 70 - 99 mg/dL    Comment: Glucose reference range applies only to samples taken after fasting for at least 8 hours.  Basic metabolic panel     Status: Abnormal   Collection Time: 10/14/19  3:43 AM  Result Value Ref Range   Sodium 134 (L) 135 - 145 mmol/L   Potassium 4.1 3.5 -  5.1 mmol/L   Chloride 97 (L) 98 - 111 mmol/L   CO2 30 22 - 32 mmol/L   Glucose, Bld 104 (H) 70 - 99 mg/dL    Comment: Glucose reference range applies only to samples taken after fasting for at least 8 hours.   BUN 6 (L) 8 - 23 mg/dL   Creatinine, Ser 0.50 0.44 - 1.00 mg/dL   Calcium 8.8 (L) 8.9 - 10.3 mg/dL   GFR calc non Af Amer >60 >60 mL/min   GFR calc Af Amer >60 >60 mL/min   Anion gap 7 5 - 15    Comment: Performed at [facility] [address] [place], [place] [address]  CBC     Status: Abnormal   Collection Time: 10/14/19  3:43 AM  Result Value Ref Range   WBC 7.4 4.0 - 10.5 K/uL   RBC 4.82 3.87 - 5.11 MIL/uL   Hemoglobin 13.0 12.0 - 15.0 g/dL   HCT 42.9 36.0 - 46.0 %   MCV 89.0 80.0 - 100.0 fL   MCH 27.0 26.0 - 34.0 pg   MCHC 30.3 30.0 - 36.0 g/dL   RDW 18.2 (H) 11.5 - 15.5 %   Platelets 197 150 - 400 K/uL   nRBC 0.0 0.0 - 0.2 %    Comment: Performed at [facility], [place] [address] [place], [place] [address]   Glucose, capillary     Status: Abnormal   Collection Time: 10/14/19  7:34 AM  Result Value Ref Range   Glucose-Capillary 103 (H) 70 - 99 mg/dL    Comment: Glucose reference range applies only to samples taken after fasting for at least 8 hours.  Digoxin level     Status: Abnormal   Collection Time: 10/14/19 11:00 AM  Result Value Ref Range   Digoxin Level 0.6 (L) 0.8 - 2.0 ng/mL    Comment: Performed at [facility] [address], [place], [place] [address]    US PELVIC COMPLETE W TRANSVAGINAL AND TORSION R/O  Result Date: 10/14/2019 CLINICAL DATA:  Lower abdominal pain for 2 days, rule out ovarian torsion EXAM: TRANSABDOMINAL AND TRANSVAGINAL ULTRASOUND OF PELVIS DOPPLER ULTRASOUND OF OVARIES TECHNIQUE: Both transabdominal and transvaginal ultrasound examinations of the pelvis were performed. Transabdominal technique was performed for global imaging of the pelvis including uterus, ovaries, adnexal regions, and pelvic cul-de-sac. It was necessary to proceed with endovaginal exam following the transabdominal exam to visualize the uterus, endometrium, ovaries, and adnexa. Color and duplex Doppler ultrasound was utilized to evaluate blood flow to the ovaries. COMPARISON:  CT chest abdomen pelvis, 10/13/2019 FINDINGS: Uterus Measurements: 6.1 x 2.8 x 3.9 cm = volume: 35 mL. No fibroids or other mass visualized. Endometrium Thickness: 3 mm.  Small volume fluid in the endometrial cavity. Right ovary Measurements: 7.5 x 4.3 x 4.7 cm = volume: 80 mL. The right ovary is completely occupied or replaced by a solid mass. Left ovary Measurements: 3.1 x 1.6 x 2.0 cm = volume: 5 mL. Normal appearance/no adnexal mass. Pulsed Doppler evaluation of both ovaries demonstrates normal low-resistance arterial and venous waveforms. Other findings Small volume free fluid in the low pelvis. IMPRESSION: 1. The right ovary is completely occupied or replaced by a solid mass measuring 7.5 cm, concerning for malignancy.  There is arterial and venous flow to this mass, however any enlarged ovary can serve as a nidus for intermittent or incomplete ovarian torsion. 2. Small volume fluid in the endometrial cavity, abnormal in the late  postmenopausal setting. 3.  Small volume nonspecific free fluid in the low pelvis. Electronically Signed   By: Eddie Candle M.D.   On: 10/14/2019 17:18   CT Angio Chest/Abd/Pel for Dissection W and/or W/WO  Addendum Date: 10/13/2019   ADDENDUM REPORT: 10/13/2019 14:53 IMPRESSION: 6. Increased soft tissue density over the anterior aspect of the bladder base just anterior to the region of the trigone as cannot exclude a mass in this location. Recommend clinical correlation. Electronically Signed   By: Marin Olp M.D.   On: 10/13/2019 14:53   Result Date: 10/13/2019 CLINICAL DATA:  Severe abdominal pain since yesterday with pain mid back. EXAM: CT ANGIOGRAPHY CHEST, ABDOMEN AND PELVIS TECHNIQUE: Non-contrast CT of the chest was initially obtained. Multidetector CT imaging through the chest, abdomen and pelvis was performed using the standard protocol during bolus administration of intravenous contrast. Multiplanar reconstructed images and MIPs were obtained and reviewed to evaluate the vascular anatomy. CONTRAST:  118mL OMNIPAQUE IOHEXOL 350 MG/ML SOLN COMPARISON:  CT abdomen/pelvis 08/31/2019, CT chest 11/16/2017 and CT 06/02/2016, CT 05/20/2013 FINDINGS: CTA CHEST FINDINGS Cardiovascular: Mild cardiomegaly and tiny amount of pericardial fluid. Minimal calcified plaque over the left main and 3 vessel coronary arteries. No evidence of thoracic aortic aneurysm. Calcified plaque is present throughout the thoracic aorta. No evidence of aortic dissection. Pulmonary arterial system is well opacified without emboli. Mediastinum/Nodes: No significant mediastinal or hilar adenopathy. A few calcified mediastinal left hilar lymph nodes are present. Remaining mediastinal structures are unremarkable.  Lungs/Pleura: There is moderate centrilobular emphysematous disease present. Lungs are adequately inflated with minimal dependent bibasilar atelectasis left worse than right. Calcified granuloma left lower lobe. Airways are normal. Musculoskeletal: No acute findings. Prominent loose body over the posterior left shoulder joint unchanged. Review of the MIP images confirms the above findings. CTA ABDOMEN AND PELVIS FINDINGS VASCULAR Aorta: Abdominal aorta is normal in caliber without evidence of aneurysm. 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 Mild-to-moderate calcified plaque throughout the abdominal aorta. Celiac: Patent without evidence of aneurysm, dissection, vasculitis or significant stenosis. SMA: Patent without evidence of aneurysm, dissection, vasculitis or significant stenosis. Renals: Both renal arteries are patent without evidence of aneurysm, dissection, vasculitis, fibromuscular dysplasia or significant stenosis. IMA: Patent without evidence of aneurysm, dissection, vasculitis or significant stenosis. Inflow: Calcified plaque over the iliac arteries bilaterally without significant focal stenosis or occlusion. Veins: Unremarkable. Review of the MIP images confirms the above findings. NON-VASCULAR Hepatobiliary: Liver, gallbladder and biliary tree are unchanged. Subtle nodular contour of the liver. Pancreas: Normal. Spleen: Unremarkable. Adrenals/Urinary Tract: Adrenal glands are normal. Kidneys are normal in size without hydronephrosis. Ureters are unremarkable. Bladder is normally distended. There is mild increased soft tissue density over the midline floor of the bladder just anterior to the trigone as cannot exclude a possible mass. Stomach/Bowel: Stomach and small bowel are within normal. Evidence of previous appendectomy. Colon is normal. Lymphatic: No evidence of adenopathy.  Reproductive: Uterus is just left of midline and unremarkable. Left ovary is normal. There is an oval solid mass over the right adnexa abutting the right lateral aspect of the uterus measuring 4.4 x 6.1 cm without significant change in size from recent CT and only slightly increased in size compared to 2014. Other: Tiny amount of free fluid in the pelvis. No focal inflammatory change. Musculoskeletal: Evidence of patient's compression fractures post kyphoplasty involving L2, L3 and L4. There is  mild compression fracture of L5 slightly worse compared to the recent prior exam. Degenerative change of the spine and hips. Review of the MIP images confirms the above findings. IMPRESSION: 1. No evidence of significant aneurysm or dissection involving the thoracoabdominal aorta. 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 2.  No acute findings in the chest, abdomen or pelvis. 3. Aortic Atherosclerosis (ICD10-I70.0) and Emphysema (ICD10-J43.9). Mild cardiomegaly with atherosclerotic coronary artery disease. 4. 6.1 cm solid mass over the right adnexa without significant change from March 2021 although slightly increased in size compared to remote prior exams dating back to 2014. This may represent a right ovarian mass such as low-grade malignancy versus serosal fibroid. No adenopathy. Consider OBGYN consultation for possible excision. 5. Multiple stable lumbar spine compression fractures post kyphoplasty with slight worsening of a mild L5 compression fracture. Electronically Signed: By: Marin Olp M.D. On: 10/13/2019 14:28    Assessment/Plan: 79 yo woman with adnexal mass and abdominal pain -- No pain on exam at time of my evaluation.  Unclear from this imaging and history if the findings in the adnexa represent a malignancy vs a fibroid.  CA125 ordered.  No symptoms of torsion on exam and bilateral flow noted on ultrasound imaging.   Patient would be poor candidate for any surgical intervention due to medical comorbidities.  If patient is able to be discharged while results are pending, should follow up with [facility] in office.  Debbrah Alar 10/14/2019

## 2019-10-14 NOTE — Plan of Care (Signed)
  Problem: Nutrition: Goal: Adequate nutrition will be maintained Outcome: Progressing   Problem: Coping: Goal: Level of anxiety will decrease Outcome: Progressing   Problem: Pain Managment: Goal: General experience of comfort will improve Outcome: Progressing   

## 2019-10-14 NOTE — Progress Notes (Signed)
Date: 10/14/2019  Patient name: Tina Jimenez  Medical record number: [id number]  Date of birth: 28-Oct-1940   I have seen and evaluated Tina Jimenez and discussed their care with the Residency Team.  Tina Jimenez is a 79 year old community dwelling female with a history of atrial fibrillation, chronic hypoxic respiratory failure secondary to COPD requiring O2, and osteoporosis.  She presents with acute abdominal pain which started during the night of April 29.  She informed the ER triage that it was also located between her shoulder blades and they forgot a CTA to rule out dissection.  Tina Jimenez states that the pain is continuous until she got fentanyl in the hospital.  She has never had pain like this before and it caused anorexia.  She relates a long history of constipation requiring MiraLAX which she took recently which worked the first day but not subsequently.  She denies any nausea, vomiting, diarrhea although she endorses anorexia since the onset of pain.  She denies weight loss but endorses early satiety.  She denies night sweats or fevers.  She denies dysuria, urinary frequency, but does have chronic nocturia.  This morning, she is pain-free on our examination because she just got a dose of fentanyl.  She was able to tolerate a cracker and some ginger ale.  Chart reviews indicates an elevated Dig level of 2.2 in January 2021.  Digoxin was not on her medication list on her June 14, 2019 discharge summary.  There are no notes indicating dig management.  She currently is on digoxin 0.125 mcg daily.  PMHx, Fam Hx, and/or Soc Hx : I reviewed her discharge summary from Tina Jimenez in December 2020 after her admission for kyphoplasty complicated by postop A. fib and an acute PE.  She also developed hospital-acquired pneumonia and Covid.  I also reviewed her note by Tina Jimenez in August 2019 who felt the adnexal mass was actually a fibroid coming from the uterus.  The patient did not  want to see Gyn Onc but the doctor was able to convince her to see a Dr. Melina Copa for further evaluation.  Vitals:   10/13/19 2350 10/14/19 0329  BP: (!) 97/57 131/68  Pulse: 89 80  Resp: 18 18  Temp: 98.7 F (37.1 C) 98.8 F (37.1 C)  SpO2: 97% 98%  GEN NAD, alert, appears comfortable HRRR no MRG Pul no tachypnea, no resp distress ABD decreased BS, no distension, palp per Dr Chase Picket note Skin warm & dry, no rash  Pertinent labs WBC 7.4 Hemoglobin 13 MCV 89 RDW 18.2 Vitamin B12 778 Sodium 133 Albumin 3.1 LFTs and lipase normal Lactic acid 0.9 SARS and influenza negative UA large leukocyte esterase, greater than 50 WBCs, 21-50 RBCs, negative nitrite, 30 protein  CTA chest and pelvis negative for aneurysm or dissection.  No acute findings explaining her abdominal pain.  6.1 solid right adnexal mass with slight increase in size.  Increased density over the anterior aspect of the bladder.  Slight worsening of mild L5 compression fracture status post kyphoplasty of L2, L3, and L4.  I personally viewed the EKG and confirmed my reading with the official read. A Fib  Assessment and Plan: I have seen and evaluated the patient as outlined above. I agree with the formulated Assessment and Plan as detailed in the residents' note, with the following changes: Tina Jimenez is a 79 year old community dwelling female who was admitted for acute abdominal pain, early satiety, and acute on chronic constipation.  The working diagnosis  was a complicated UTI (UA supported UTI) but Dr. Darrick Meigs questioned why a UTI would require fentanyl for pain relief.  Her CT scan with contrast to rule out ovarian torsion and the outpatient physician felt the ovarian mass was actually a fibroid from the uterus.  CT ruled out dissection, diverticulitis, significant pyelonephritis, pancreatitis, new compression fracture... Digoxin toxicity, either acute or chronic, is now on the differential diagnosis.  We will stop  her digoxin and check a dig level.  1.  Diffuse abdominal pain, early satiety, anorexia, and acute on chronic constipation - our working diagnosis is complicated UTI but we are expanding the differential diagnosis to include acute on chronic digoxin toxicity.  We will continue Rocephin while awaiting blood and urine cultures.  Attempt to wean down opioid.  Await digoxin level.  Continue to consider alternative etiologies.  2.  Persistent A. Fib - we do not know if Tina Jimenez and her physician have decided not to pursue a rhythm control strategy.  If they half, she will have permanent A. fib.  She is currently rate controlled on her home medicines of digoxin and carvedilol.  We are stopping her digoxin due to concern for dig overdose.  We can increase her carvedilol as needed for rate control.  She is also on apixaban for anticoagulation.  Other medical issues per Dr. Nelly Rout and Christian's notes.  Bartholomew Crews, MD 5/1/202110:07 AM

## 2019-10-15 DIAGNOSIS — N9489 Other specified conditions associated with female genital organs and menstrual cycle: Secondary | ICD-10-CM

## 2019-10-15 LAB — URINE CULTURE: Culture: >=100000 — AB

## 2019-10-15 MED ORDER — CEPHALEXIN 500 MG PO CAPS
500.0000 mg | ORAL_CAPSULE | Freq: Two times a day (BID) | ORAL | Status: DC
  Administered 2019-10-15 – 2019-10-16 (×3): 500 mg via ORAL
  Filled 2019-10-15 (×3): qty 1

## 2019-10-15 MED ORDER — IPRATROPIUM-ALBUTEROL 0.5-2.5 (3) MG/3ML IN SOLN
3.0000 mL | Freq: Four times a day (QID) | RESPIRATORY_TRACT | Status: DC
  Administered 2019-10-15 – 2019-10-16 (×4): 3 mL via RESPIRATORY_TRACT
  Filled 2019-10-15 (×4): qty 3

## 2019-10-15 MED ORDER — SENNOSIDES-DOCUSATE SODIUM 8.6-50 MG PO TABS
1.0000 | ORAL_TABLET | Freq: Two times a day (BID) | ORAL | Status: DC
  Administered 2019-10-15 – 2019-10-16 (×3): 1 via ORAL
  Filled 2019-10-15 (×3): qty 1

## 2019-10-15 NOTE — Progress Notes (Addendum)
   Subjective: Patient reports that she is having some abdominal pain today, she reports that she wasn't having any pain yesterday however it started a little before we got there. She has not had a bowel movement yet today. She reports that she has been eating and drinking okay. Denies any nausea, vomiting, chest pain, SOB or other symptoms. We discussed the results of her pelvic ultrasound, she reports that she has been aware of the mass. Discussed that we will work on her bowel movements and transitioning her antibiotics when the urine culture sensitivities return. She expressed understanding.   Objective:  Vital signs in last 24 hours: Vitals:   10/15/19 0100 10/15/19 0300 10/15/19 0450 10/15/19 0500  BP:   121/62   Pulse:   88   Resp:   20   Temp:   99.2 F (37.3 C)   TempSrc:   Oral   SpO2: 97% 98% 99% 98%  Weight:   64.5 kg   Height:       General: Elderly female, NAD, laying in bed Cardiac: RRR, no m/r/g Pulmonary: CTABL, no wheezing, rhonchi, rales Abdomen: Soft, non-tender, non-distended, normoactive bowel sounds  Assessment/Plan:  Active Problems:   Abdominal pain   Adnexal mass  This is a 79 year old female with history of COPD, PE/DVT, atrial fibrillation, recent COVID infection, and GERD presented with acute intractable abdominal pain.     Diffuse abdominal pain: Unclear etiology, differential includes UTI versus malignancy vs constipation.  Urinalysis causing incomplete show moderate hemoglobin, large leukocytes, WBC, and bacteria.  Urine culture showed>100,000 E. coli, sensitivities showed sensitivity to ciprofloxacin and nitrofurantoin.  -CT abdomen pelvis showed no evidence of nephrolithiasis, obstruction, or other etiologies for acute abdominal pain -Patient is on digoxin and digoxin related abdominal pain was a concern however digoxin level was 0.6 so this is less likely. -Ovarian torsion was a consideration, pelvic ultrasound showed a right ovarian solid mass  concerning for malignancy versus fibroid.  This could cause an incomplete uterine torsion however blood flow is doing and was consulted given findings during fibroid versus malignancy, obtained a CA-125.  If patient is discharged would be follow-up with [facility]. -Decreased her pain medications yesterday from fentanyl to oxycodone and Dilaudid as needed.  Today she reports that her abdominal pain has returned today however it's less then what it was.  She is remained afebrile with stable vital signs.  -Discontinue Rocephin day 3 -Follow-up blood cultures -F/u CEA-125, will need o/p OB-GYN follow up when discharged -Stop dilaudid PRN, continue OxyIR PRN -After discussing with pharmacy plan to switch to cephalexin today  Iron deficiency anemia: -Hemoglobin today is pending.  Continue home iron supplementation.  Atrial fibrillation: Rate controlled. -Continue Eliquis, Coreg, and digoxin  Prior to Admission Living Arrangement: Home  Anticipated Discharge Location: Home Barriers to Discharge: Pending clinical improvement Dispo: Anticipated discharge in approximately 1-2 day(s).   Asencion Noble, MD 10/15/2019, 6:38 AM Pager: [phone]

## 2019-10-15 NOTE — Progress Notes (Signed)
Patient had 9 beat run of v-tach at 2:25. Patient assessed and found to be sleeping. Will continue to monitor.

## 2019-10-16 DIAGNOSIS — N9489 Other specified conditions associated with female genital organs and menstrual cycle: Secondary | ICD-10-CM

## 2019-10-16 DIAGNOSIS — R1084 Generalized abdominal pain: Secondary | ICD-10-CM

## 2019-10-16 LAB — CA 125: Cancer Antigen (CA) 125: 27.8 U/mL (ref 0.0–38.1)

## 2019-10-16 MED ORDER — MAGNESIUM CITRATE PO SOLN
1.0000 | Freq: Once | ORAL | Status: DC
  Filled 2019-10-16: qty 296

## 2019-10-16 NOTE — Plan of Care (Signed)
  Problem: Education: Goal: Knowledge of General Education information will improve Description: Including pain rating scale, medication(s)/side effects and non-pharmacologic comfort measures Outcome: Completed/Met   Problem: Health Behavior/Discharge Planning: Goal: Ability to manage health-related needs will improve Outcome: Completed/Met   Problem: Clinical Measurements: Goal: Ability to maintain clinical measurements within normal limits will improve Outcome: Completed/Met Goal: Will remain free from infection Outcome: Completed/Met Goal: Diagnostic test results will improve Outcome: Completed/Met Goal: Respiratory complications will improve Outcome: Completed/Met Goal: Cardiovascular complication will be avoided Outcome: Completed/Met   Problem: Coping: Goal: Level of anxiety will decrease Outcome: Completed/Met   Problem: Elimination: Goal: Will not experience complications related to bowel motility Outcome: Completed/Met Goal: Will not experience complications related to urinary retention Outcome: Completed/Met   Problem: Pain Managment: Goal: General experience of comfort will improve Outcome: Completed/Met

## 2019-10-16 NOTE — Discharge Instructions (Signed)
Follow up with gynecology for continued monitoring of your ovarian mass as well as to follow up on labs obtaining during hospitalization for evaluating this.

## 2019-10-16 NOTE — Progress Notes (Signed)
  Date: 10/16/2019  Patient name: Tina Jimenez  Medical record number: [id number]  Date of birth: [birth year]        I have seen and evaluated this patient and I have discussed the plan of care with the house staff. Please see Dr. Chase Picket note for complete details. I concur with her findings and plan.  Patient to be discharged today with good follow up with Gyn and PCP.   Sid Falcon, MD 10/16/2019, 2:38 PM

## 2019-10-16 NOTE — Progress Notes (Signed)
NAME:  Tina Jimenez, MRN:  [id number], DOB:  12-Jun-1941, LOS: 3 ADMISSION DATE:  10/13/2019   Brief History  79 yo female admitted to [facility] on 4/30 for symptoms consistent with complicated UTI. History of adenexal mass vs fibroid since 2014.  Subjective/Interm hx  No overnight events. Tina Jimenez is interested in going home today. We also discussed plan for bowel regimen since she has not had any bowel movements during admission. She endorses continued abdominal pain that is intermittent.   Emphasized need to follow up with GYN and PCP.   Significant Hospital Events   4/30 hospital admission  5/1 transitioned from fentanyl to oxy + dilaudid, pelvic US neg for torsion 5/2 3d course of rocephin completed for UTI. GYN consulted for mass>CA-125 ordered.  Objective   Blood pressure 127/68, pulse 85, temperature 98.5 F (36.9 C), temperature source Oral, resp. rate 20, height 5\' 3"  (1.6 m), weight 64.5 kg, SpO2 99 %.     Intake/Output Summary (Last 24 hours) at 10/16/2019 0554 Last data filed at 10/16/2019 0300 Gross per 24 hour  Intake 480 ml  Output -  Net 480 ml   Filed Weights   10/14/19 0329 10/15/19 0450 10/16/19 0450  Weight: 63.7 kg 64.5 kg 64.5 kg    Examination: GENERAL: in no acute distress CARDIAC: regular rate. Irregular rhythm PULMONARY: Lung sounds clear to auscultation. ABDOMEN: abdomen distended. bs active. Diffuse tenderness to palpation  SKIN: no rash or lesions on limited exam   Consults:  GYN  Significant Diagnostic Tests:  4/30 dissection CTA>> no evidence of aortic aneurysm or dissection. Moderate centrilobar emphysematous disease. 6.1cm solid mass over right adenexa not significantly changed from 73mo prior.  Increased soft tissue density over the midline floor of the bladder.  5/1 pelvic US>> right ovary completed occupied/replaced by solid mass measuring 7.5cm, concerning for malignancy. Arterial and venous blood flow present. Small volume fluid  in endometrial cavity--abnormal in late postmenopausal setting. Small volume nonspec free fluid in low pelvis  Micro Data:  4/30 urine culture>>E.coli. resistance to bactrim and ampicillin 4/30 Blood cultures>>NGTD  Antimicrobials:  Rocephin 4/30>>5/2 Keflex 5/2>>5/3  Labs    CBC Latest Ref Rng & Units 10/14/2019 10/13/2019 09/21/2019  WBC 4.0 - 10.5 K/uL 7.4 9.3 7.8  Hemoglobin 12.0 - 15.0 g/dL 13.0 11.1(L) 9.6(L)  Hematocrit 36.0 - 46.0 % 42.9 36.7 32.8(L)  Platelets 150 - 400 K/uL 197 247 259   BMP Latest Ref Rng & Units 10/14/2019 10/13/2019 09/21/2019  Glucose 70 - 99 mg/dL 104(H) 110(H) 110(H)  BUN 8 - 23 mg/dL 6(L) 7(L) 8  Creatinine 0.44 - 1.00 mg/dL 0.50 0.46 0.37(L)  BUN/Creat Ratio 12 - 28 - - 22  Sodium 135 - 145 mmol/L 134(L) 133(L) 136  Potassium 3.5 - 5.1 mmol/L 4.1 4.4 4.3  Chloride 98 - 111 mmol/L 97(L) 94(L) 97  CO2 22 - 32 mmol/L 30 29 26   Calcium 8.9 - 10.3 mg/dL 8.8(L) 9.0 8.9    Summary  79 yo female admitted to [facility] for severe abdominal pain and found to have bacturia without CT findings of pyelonephritis or stone. Pain seems to be out of proportion to a diagnosis of cystitis so we are working up other etiologies to her abdominal pain at this time.  Assessment & Plan:  Active Problems:   Abdominal pain   Adnexal mass   Abdominal pain. UTI vs mass. Improved this morning. Remains afebrile. -torsion unlikely given normal blood flow on pelvic US -overall picture not  consistent with being from UTI given her degree of pain E.coli UTI. Received 3d course of rocephin. Transitioned to keflex 5/2 7.5 cm Right Adenexal mass and focal increased bladder wall density -recent arm VTE and PE in January 2021 suspicious for malignancy however was also in the setting of COVID 19 infection -GYN consulted>CA 125 in process Plan D/C keflex since she has already received of abx treatment  Iron deficiency anemia. Stable on admission. Iron deficiency supported on iron panel  performed in march. Will need age appropriate screening as well as further investigation into the bladder wall and adenexal findings at discharge. Continue iron supplmentation  Atrial fibrillation. Rate controlled on carvedilol and digoxin. In afib on admission. Continue eliquis, digoxin and carvedilol.  Chronic respiratory failure due to Centrilobar Emphysema. On 4L supplemental O2 at baseline. Continue breo ellipta and prn duonebs.  Best practice:  CODE STATUS: DNR/DNI Diet: cardiac DVT for prophylaxis: eliquis Social considerations/Family communication: per patient Dispo: Stable for d/c today. Son will pick up per pt. Follow up with GYN at discharge   Mitzi Hansen, MD [facility] PGY-1 PAGER #: [phone] 10/16/19  5:54 AM

## 2019-10-16 NOTE — Care Management Important Message (Signed)
Important Message  Patient Details  Name: MARDELL SCHULDT MRN: [id number] Date of Birth: 1940-12-26   Medicare Important Message Given:  Yes     Shelda Altes 10/16/2019, 10:49 AM

## 2019-10-16 NOTE — Discharge Summary (Signed)
Name: Tina Jimenez MRN: [id number] DOB: [birth year] 79 y.o. PCP: Marge Duncans, PA-C  Date of Admission: 10/13/2019 11:27 AM Date of Discharge: 10/16/19 Attending Physician: Sid Falcon, MD  Discharge Diagnosis: 1. Acute onset severe abdominal pain.  2. E. Coli UTI 3. 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 Chronic respiratory failure due to centrilobar emphysema (4L O2) 5. Chronic compression fractures  Discharge Medications: Allergies as of 10/16/2019      Reactions   Klonopin [clonazepam] Other (See Comments)   Pt can't remember reaction   Lipitor [atorvastatin] Other (See Comments)   Pt could barely lift arms   Simvastatin Other (See Comments)   Pt can't remember reaction      Medication List    TAKE these medications   Breo Ellipta 100-25 MCG/INH Aepb Generic drug: fluticasone furoate-vilanterol Inhale 1 puff into the lungs daily.   carvedilol 6.25 MG tablet Commonly known as: COREG Take 6.25 mg by mouth 2 (two) times daily.   Co-Enzyme Q-10 100 MG Caps Take 100 mg by mouth daily.   digoxin 0.125 MG tablet Commonly known as: LANOXIN Take 1 tablet (0.125 mg total) by mouth daily.   Eliquis 5 MG Tabs tablet Generic drug: apixaban Take 5 mg by mouth 2 (two) times daily.   ezetimibe 10 MG tablet Commonly known as: ZETIA Take 10 mg by mouth daily.   famotidine 40 MG tablet Commonly known as: PEPCID Take 40 mg by mouth at bedtime.   ferrous sulfate 325 (65 FE) MG tablet Take 325 mg by mouth 2 (two) times daily with a meal.   fluticasone 50 MCG/ACT nasal spray Commonly known as: FLONASE Place 2 sprays into both nostrils daily.   furosemide 20 MG tablet Commonly known as: LASIX TAKE 1/2 - 1 TABLET BY MOUTH DAILY AS NEEDED FOR EDEMA What changed: See the new instructions.   ipratropium-albuterol 0.5-2.5 (3) MG/3ML Soln Commonly known as: DUONEB Take 3 mLs by nebulization in the morning, at noon, in the evening, and at bedtime.   loratadine 10 MG  tablet Commonly known as: CLARITIN TAKE 1 TABLET BY MOUTH ONCE DAILY FOR ALLERGIES What changed: See the new instructions.   omeprazole 20 MG capsule Commonly known as: PRILOSEC Take 20 mg by mouth daily.       Disposition and follow-up:   Tina Jimenez was discharged from [facility] in Stable condition.  At the hospital follow up visit please address:  1.  E. Coli UTI. Completed 3d course of IV rocephin. Re-evaluate abdominal pain.  Repeat UA for evaluation of hematuria on admission UA.  2. 7.5cm right ovarian mass. Present since at least 2014. Imaging obtained this admission. Follow up CA-125 obtaining during hospitalization.  2.  Labs / imaging needed at time of follow-up: none  3.  Pending labs/ test needing follow-up: Ca-125  Follow-up Appointments: [facility] Follow up.   Specialty: Gynecology Contact information: [address] [place] [phone]       Marge Duncans, PA-C Follow up.   Specialty: Physician Assistant Contact information: [address] [place] [address] [phone]           Hospital Course: 79 yo female with chronic respiratory failure due to emphysema (4L supplemental O2 requirement) who presented to [facility] on 4/30 for acute onset severe abdominal pain that awoke her in her sleep. Pain was located in her inguinal region and was constant  in nature.  UA on admission showed bacturia without nitrites and +hematuria. Pt denies presence of cystitis sx. She was afebrile without a leukocytosis. She received 3d course of IV rocephin. The urine culture returned and was positive for E.coli that was resistant to ampicillin and bactrim. Blood cultures remained negative during her admission and she remained without fever.  On admission, she was also requiring fentanyl for pain relief which seemed out of proportion to  cystitis. Aortic dissection CTA was obtained on admission which did not reveal any acute findings however a right adenexal mass and an area of focal increased density of her bladder was noted. No findings consistent with renal calculi, hydronephrosis or pyelonephritis appreciated on the CT. No findings consistent with acute bowel/mesenteric ischemia were appreciated.  Due to the severe pain requiring fentanyl, pelvic ultrasound was obtained to r/o torsion in the setting of increased risk due to the mass. Arterial and venous flow was good and mass was found to be 7.5cm. GYN was consulted due to this and some abnormal free fluid found in the endometrial cavity and pelvis. GYN ordered a CA-125 and would like her to follow up in their clinic for further monitoring. Given the stability of this mass, I have a fairly low suspicion for malignancy however the increased density in the bladder wall along with recent VTE/PE in January (also in the setting of COVID infection) does hold some weight for concern.  On chart review, it appears the adenexal mass had been present since at least 2014 and size is relatively stable. She had been offered bx at that time but apparently elected to forgo that procedure at that time. She denies poor appetite, weight loss, night sweats. She did note that she has chronic constipation which has been worse over the past couple of months along with increasing abdominal bloating that has been making it difficult to close her pants.  Discharge Vitals:   BP 127/68 (BP Location: Left Arm)   Pulse 85   Temp 98.5 F (36.9 C) (Oral)   Resp 20   Ht 5\' 3"  (1.6 m)   Wt 64.5 kg   SpO2 97%   BMI 25.19 kg/m   Pertinent Labs, Studies, and Procedures:  4/30 dissection CTA>> no evidence of aortic aneurysm or dissection. Moderate centrilobar emphysematous disease. 6.1cm solid mass over right adenexa not significantly changed from 30mo prior.  Increased soft tissue density over the midline floor  of the bladder.  5/1 pelvic US>> right ovary completed occupied/replaced by solid mass measuring 7.5cm, concerning for malignancy. Arterial and venous blood flow present. Small volume fluid in endometrial cavity--abnormal in late postmenopausal setting. Small volume nonspecific free fluid in low pelvis  Discharge Instructions: follow up with GYN for right adenexal mass. Repeat UA for evaluation of hematuria and consider further evaluation if still positive (increased focal density of anterior bladder wall on CTA). Follow up CA-125.   SignedMitzi Hansen, MD 10/16/2019, 11:13 AM   Pager: [phone]

## 2019-10-18 LAB — CULTURE, BLOOD (ROUTINE X 2)
Culture: NO GROWTH
Culture: NO GROWTH
Special Requests: ADEQUATE
Special Requests: ADEQUATE

## 2019-10-19 ENCOUNTER — Telehealth: Payer: Self-pay

## 2019-10-19 DIAGNOSIS — J690 Pneumonitis due to inhalation of food and vomit: Secondary | ICD-10-CM | POA: Diagnosis not present

## 2019-10-19 DIAGNOSIS — E785 Hyperlipidemia, unspecified: Secondary | ICD-10-CM | POA: Diagnosis not present

## 2019-10-19 DIAGNOSIS — I480 Paroxysmal atrial fibrillation: Secondary | ICD-10-CM | POA: Diagnosis not present

## 2019-10-19 DIAGNOSIS — J449 Chronic obstructive pulmonary disease, unspecified: Secondary | ICD-10-CM | POA: Diagnosis not present

## 2019-10-19 DIAGNOSIS — N390 Urinary tract infection, site not specified: Secondary | ICD-10-CM | POA: Diagnosis not present

## 2019-10-19 DIAGNOSIS — J439 Emphysema, unspecified: Secondary | ICD-10-CM | POA: Diagnosis not present

## 2019-10-19 DIAGNOSIS — Z9981 Dependence on supplemental oxygen: Secondary | ICD-10-CM | POA: Diagnosis not present

## 2019-10-19 DIAGNOSIS — S32020D Wedge compression fracture of second lumbar vertebra, subsequent encounter for fracture with routine healing: Secondary | ICD-10-CM | POA: Diagnosis not present

## 2019-10-19 DIAGNOSIS — R339 Retention of urine, unspecified: Secondary | ICD-10-CM | POA: Diagnosis not present

## 2019-10-19 DIAGNOSIS — J9611 Chronic respiratory failure with hypoxia: Secondary | ICD-10-CM | POA: Diagnosis not present

## 2019-10-19 DIAGNOSIS — R600 Localized edema: Secondary | ICD-10-CM | POA: Diagnosis not present

## 2019-10-19 DIAGNOSIS — K219 Gastro-esophageal reflux disease without esophagitis: Secondary | ICD-10-CM | POA: Diagnosis not present

## 2019-10-19 DIAGNOSIS — T17920D Food in respiratory tract, part unspecified causing asphyxiation, subsequent encounter: Secondary | ICD-10-CM | POA: Diagnosis not present

## 2019-10-19 DIAGNOSIS — D509 Iron deficiency anemia, unspecified: Secondary | ICD-10-CM | POA: Diagnosis not present

## 2019-10-19 DIAGNOSIS — Z86711 Personal history of pulmonary embolism: Secondary | ICD-10-CM | POA: Diagnosis not present

## 2019-10-19 DIAGNOSIS — Z7901 Long term (current) use of anticoagulants: Secondary | ICD-10-CM | POA: Diagnosis not present

## 2019-10-19 DIAGNOSIS — Z8616 Personal history of COVID-19: Secondary | ICD-10-CM | POA: Diagnosis not present

## 2019-10-19 DIAGNOSIS — Z9181 History of falling: Secondary | ICD-10-CM | POA: Diagnosis not present

## 2019-10-19 DIAGNOSIS — B9620 Unspecified Escherichia coli [E. coli] as the cause of diseases classified elsewhere: Secondary | ICD-10-CM | POA: Diagnosis not present

## 2019-10-19 NOTE — Telephone Encounter (Addendum)
  Transition Care Management Follow-up Telephone Call  Tina Jimenez 02-12-1941  Admit Date: 10/13/2019 Discharge Date: 10/16/2019  Diagnoses:  1. Acute onset severe abdominal pain.  2. E. Coli UTI 3. 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 Chronic respiratory failure due to centrilobar emphysema (4L O2) 5. Chronic compression fractures  2 day post discharge: 10/18/2019 7 day post discharge: 10/23/2019 14 day post discharge: 10/30/2019  Tina Jimenez was discharged from [facility] on 10/16/19 with the diagnoses listed above.  She was contacted today via telephone in regards to transition of care.    She presented to the ED with complaints of severe consistent abdominal pain. UA was positive for E.coli and was treated with three days course of IV Rocephin  7.5cm right adenexal mass, stable in size since 2014 - will follow-up with GYN  Discharge Instructions: Needs follow-up UA with PCP  Items Reviewed:  Medications reviewed: yes  Allergies reviewed: yes  Dietary changes reviewed: yes  Referrals reviewed: yes   Confirmed importance and date/time of follow-up visits scheduled yes  Provider Appointment booked with [name]  Confirmed with patient if condition begins to worsen call PCP or go to the ER.  Patient was given the office number and encouraged to call back with question or concerns.  : yes   Shelle Iron, LPN [phone] AM

## 2019-10-24 ENCOUNTER — Other Ambulatory Visit: Payer: Self-pay | Admitting: Cardiology

## 2019-10-24 DIAGNOSIS — I480 Paroxysmal atrial fibrillation: Secondary | ICD-10-CM | POA: Diagnosis not present

## 2019-10-24 DIAGNOSIS — N390 Urinary tract infection, site not specified: Secondary | ICD-10-CM | POA: Diagnosis not present

## 2019-10-24 DIAGNOSIS — T17920D Food in respiratory tract, part unspecified causing asphyxiation, subsequent encounter: Secondary | ICD-10-CM | POA: Diagnosis not present

## 2019-10-24 DIAGNOSIS — Z7901 Long term (current) use of anticoagulants: Secondary | ICD-10-CM | POA: Diagnosis not present

## 2019-10-24 DIAGNOSIS — J690 Pneumonitis due to inhalation of food and vomit: Secondary | ICD-10-CM | POA: Diagnosis not present

## 2019-10-24 DIAGNOSIS — Z9981 Dependence on supplemental oxygen: Secondary | ICD-10-CM | POA: Diagnosis not present

## 2019-10-24 DIAGNOSIS — Z86711 Personal history of pulmonary embolism: Secondary | ICD-10-CM | POA: Diagnosis not present

## 2019-10-24 DIAGNOSIS — R600 Localized edema: Secondary | ICD-10-CM | POA: Diagnosis not present

## 2019-10-24 DIAGNOSIS — S32020D Wedge compression fracture of second lumbar vertebra, subsequent encounter for fracture with routine healing: Secondary | ICD-10-CM | POA: Diagnosis not present

## 2019-10-24 DIAGNOSIS — B9620 Unspecified Escherichia coli [E. coli] as the cause of diseases classified elsewhere: Secondary | ICD-10-CM | POA: Diagnosis not present

## 2019-10-24 DIAGNOSIS — J9611 Chronic respiratory failure with hypoxia: Secondary | ICD-10-CM | POA: Diagnosis not present

## 2019-10-24 DIAGNOSIS — J439 Emphysema, unspecified: Secondary | ICD-10-CM | POA: Diagnosis not present

## 2019-10-24 DIAGNOSIS — J449 Chronic obstructive pulmonary disease, unspecified: Secondary | ICD-10-CM | POA: Diagnosis not present

## 2019-10-24 DIAGNOSIS — D509 Iron deficiency anemia, unspecified: Secondary | ICD-10-CM | POA: Diagnosis not present

## 2019-10-24 DIAGNOSIS — E785 Hyperlipidemia, unspecified: Secondary | ICD-10-CM | POA: Diagnosis not present

## 2019-10-24 DIAGNOSIS — R339 Retention of urine, unspecified: Secondary | ICD-10-CM | POA: Diagnosis not present

## 2019-10-24 DIAGNOSIS — K219 Gastro-esophageal reflux disease without esophagitis: Secondary | ICD-10-CM | POA: Diagnosis not present

## 2019-10-24 DIAGNOSIS — Z8616 Personal history of COVID-19: Secondary | ICD-10-CM | POA: Diagnosis not present

## 2019-10-24 DIAGNOSIS — Z9181 History of falling: Secondary | ICD-10-CM | POA: Diagnosis not present

## 2019-10-25 DIAGNOSIS — J690 Pneumonitis due to inhalation of food and vomit: Secondary | ICD-10-CM | POA: Diagnosis not present

## 2019-10-25 DIAGNOSIS — Z9981 Dependence on supplemental oxygen: Secondary | ICD-10-CM | POA: Diagnosis not present

## 2019-10-25 DIAGNOSIS — Z7901 Long term (current) use of anticoagulants: Secondary | ICD-10-CM | POA: Diagnosis not present

## 2019-10-25 DIAGNOSIS — J449 Chronic obstructive pulmonary disease, unspecified: Secondary | ICD-10-CM | POA: Diagnosis not present

## 2019-10-25 DIAGNOSIS — R339 Retention of urine, unspecified: Secondary | ICD-10-CM | POA: Diagnosis not present

## 2019-10-25 DIAGNOSIS — J439 Emphysema, unspecified: Secondary | ICD-10-CM | POA: Diagnosis not present

## 2019-10-25 DIAGNOSIS — K219 Gastro-esophageal reflux disease without esophagitis: Secondary | ICD-10-CM | POA: Diagnosis not present

## 2019-10-25 DIAGNOSIS — Z9181 History of falling: Secondary | ICD-10-CM | POA: Diagnosis not present

## 2019-10-25 DIAGNOSIS — J9611 Chronic respiratory failure with hypoxia: Secondary | ICD-10-CM | POA: Diagnosis not present

## 2019-10-25 DIAGNOSIS — D509 Iron deficiency anemia, unspecified: Secondary | ICD-10-CM | POA: Diagnosis not present

## 2019-10-25 DIAGNOSIS — N390 Urinary tract infection, site not specified: Secondary | ICD-10-CM | POA: Diagnosis not present

## 2019-10-25 DIAGNOSIS — E785 Hyperlipidemia, unspecified: Secondary | ICD-10-CM | POA: Diagnosis not present

## 2019-10-25 DIAGNOSIS — I480 Paroxysmal atrial fibrillation: Secondary | ICD-10-CM | POA: Diagnosis not present

## 2019-10-25 DIAGNOSIS — S32020D Wedge compression fracture of second lumbar vertebra, subsequent encounter for fracture with routine healing: Secondary | ICD-10-CM | POA: Diagnosis not present

## 2019-10-25 DIAGNOSIS — Z8616 Personal history of COVID-19: Secondary | ICD-10-CM | POA: Diagnosis not present

## 2019-10-25 DIAGNOSIS — Z86711 Personal history of pulmonary embolism: Secondary | ICD-10-CM | POA: Diagnosis not present

## 2019-10-25 DIAGNOSIS — T17920D Food in respiratory tract, part unspecified causing asphyxiation, subsequent encounter: Secondary | ICD-10-CM | POA: Diagnosis not present

## 2019-10-25 DIAGNOSIS — B9620 Unspecified Escherichia coli [E. coli] as the cause of diseases classified elsewhere: Secondary | ICD-10-CM | POA: Diagnosis not present

## 2019-10-25 DIAGNOSIS — R600 Localized edema: Secondary | ICD-10-CM | POA: Diagnosis not present

## 2019-10-26 ENCOUNTER — Ambulatory Visit (INDEPENDENT_AMBULATORY_CARE_PROVIDER_SITE_OTHER): Payer: Medicare Other | Admitting: Physician Assistant

## 2019-10-26 ENCOUNTER — Encounter: Payer: Self-pay | Admitting: Physician Assistant

## 2019-10-26 ENCOUNTER — Other Ambulatory Visit: Payer: Self-pay

## 2019-10-26 VITALS — BP 150/72 | HR 88 | Temp 97.5°F | Ht 63.0 in | Wt 140.0 lb

## 2019-10-26 DIAGNOSIS — R002 Palpitations: Secondary | ICD-10-CM | POA: Diagnosis not present

## 2019-10-26 DIAGNOSIS — M25512 Pain in left shoulder: Secondary | ICD-10-CM

## 2019-10-26 DIAGNOSIS — N9489 Other specified conditions associated with female genital organs and menstrual cycle: Secondary | ICD-10-CM | POA: Diagnosis not present

## 2019-10-26 DIAGNOSIS — N3289 Other specified disorders of bladder: Secondary | ICD-10-CM

## 2019-10-26 DIAGNOSIS — N3001 Acute cystitis with hematuria: Secondary | ICD-10-CM | POA: Insufficient documentation

## 2019-10-26 HISTORY — DX: Pain in left shoulder: M25.512

## 2019-10-26 HISTORY — DX: Acute cystitis with hematuria: N30.01

## 2019-10-26 HISTORY — DX: Other specified disorders of bladder: N32.89

## 2019-10-26 MED ORDER — FERROUS SULFATE 325 (65 FE) MG PO TABS: 325.0000 mg | ORAL_TABLET | Freq: Every day | ORAL | 1 refills | Status: DC

## 2019-10-26 NOTE — Progress Notes (Signed)
Established Patient Office Visit  Subjective:  Patient ID: Tina Jimenez, female    DOB: 10-Aug-1940  Age: 79 y.o. MRN: [id number]  CC:  Chief Complaint  Patient presents with  . Hospital Follow up    HPI Tina Jimenez presents for hospital follow up for abdominal pain  Pt was seen at [name] hospital for abdominal pain - she was diagnosed and treated for [facility] UTI - she states today that she has mild symptoms but is not able to get ua in office  - she states that her abdominal pain was mostly caused by constipation which has resolved and she is now taking Miralax qd  Patient was found to have increased density noted on her lower bladder wall - a urology referral is recommended for further evaluation and treatment  Patient was also found to have an adnexal mass that has been stable since 2014  - CEA marker was done which was normal - a GYN referral is recommended as well  Pt complains of left shoulder pain - states it just started without injury or trauma - sore to raise her arm up over her head - a therapist comes to her home and was there yesterday and did some movement/therapy with her  Past Medical History:  Diagnosis Date  . Atrial fibrillation ([place])   . GERD (gastroesophageal reflux disease)   . Hyperlipemia     Past Surgical History:  Procedure Laterality Date  . APPENDECTOMY    . KYPHOPLASTY      Family History  Problem Relation Age of Onset  . Breast cancer Sister   . Lung cancer Sister   . Lung cancer Daughter     Social History   Socioeconomic History  . Marital status: Widowed    Spouse name: Not on file  . Number of children: 4  . Years of education: Not on file  . Highest education level: Not on file  Occupational History  . Occupation: retired  Tobacco Use  . Smoking status: Current Every Day Smoker  . Smokeless tobacco: Never Used  Substance and Sexual Activity  . Alcohol use: Never  . Drug use: Never  . Sexual activity: Not on file    Other Topics Concern  . Not on file  Social History Narrative  . Not on file   Social Determinants of Health   Financial Resource Strain:   . Difficulty of Paying Living Expenses:   Food Insecurity:   . Worried About Charity fundraiser in the Last Year:   . Arboriculturist in the Last Year:   Transportation Needs:   . Film/video editor (Medical):   Marland Kitchen Lack of Transportation (Non-Medical):   Physical Activity:   . Days of Exercise per Week:   . Minutes of Exercise per Session:   Stress:   . Feeling of Stress :   Social Connections:   . Frequency of Communication with Friends and Family:   . Frequency of Social Gatherings with Friends and Family:   . Attends Religious Services:   . Active Member of Clubs or Organizations:   . Attends Archivist Meetings:   Marland Kitchen Marital Status:   Intimate Partner Violence:   . Fear of Current or Ex-Partner:   . Emotionally Abused:   Marland Kitchen Physically Abused:   . Sexually Abused:      Current Outpatient Medications:  .  apixaban (ELIQUIS) 5 MG TABS tablet, Take 5 mg by mouth 2 (two) times daily.,  Disp: , Rfl:  .  carvedilol (COREG) 6.25 MG tablet, Take 6.25 mg by mouth 2 (two) times daily., Disp: , Rfl:  .  Co-Enzyme Q-10 100 MG CAPS, Take 100 mg by mouth daily., Disp: , Rfl:  .  digoxin (LANOXIN) 0.125 MG tablet, TAKE 1 TABLET BY MOUTH DAILY, Disp: 90 tablet, Rfl: 2 .  ezetimibe (ZETIA) 10 MG tablet, Take 10 mg by mouth daily. , Disp: , Rfl:  .  famotidine (PEPCID) 40 MG tablet, Take 40 mg by mouth at bedtime., Disp: , Rfl:  .  fluticasone (FLONASE) 50 MCG/ACT nasal spray, Place 2 sprays into both nostrils daily., Disp: 16 g, Rfl: 6 .  fluticasone furoate-vilanterol (BREO ELLIPTA) 100-25 MCG/INH AEPB, Inhale 1 puff into the lungs daily. , Disp: , Rfl:  .  furosemide (LASIX) 20 MG tablet, TAKE 1/2 - 1 TABLET BY MOUTH DAILY AS NEEDED FOR EDEMA (Patient taking differently: Take 10-20 mg by mouth daily as needed for edema. ), Disp: 30  tablet, Rfl: 0 .  ipratropium-albuterol (DUONEB) 0.5-2.5 (3) MG/3ML SOLN, Take 3 mLs by nebulization in the morning, at noon, in the evening, and at bedtime. , Disp: , Rfl:  .  loratadine (CLARITIN) 10 MG tablet, TAKE 1 TABLET BY MOUTH ONCE DAILY FOR ALLERGIES (Patient taking differently: Take 10 mg by mouth daily. ), Disp: 90 tablet, Rfl: 1 .  omeprazole (PRILOSEC) 20 MG capsule, Take 20 mg by mouth daily. , Disp: , Rfl:  .  polyethylene glycol (MIRALAX / GLYCOLAX) 17 g packet, Take 17 g by mouth daily., Disp: , Rfl:  .  ferrous sulfate 325 (65 FE) MG tablet, Take 1 tablet (325 mg total) by mouth daily with breakfast., Disp: 90 tablet, Rfl: 1   Allergies  Allergen Reactions  . Klonopin [Clonazepam] Other (See Comments)    Pt can't remember reaction  . Lipitor [Atorvastatin] Other (See Comments)    Pt could barely lift arms  . Simvastatin Other (See Comments)    Pt can't remember reaction    ROS CONSTITUTIONAL: Negative for chills, fatigue, fever, unintentional weight gain and unintentional weight loss.   CARDIOVASCULAR: Negative for chest pain, dizziness, palpitations and pedal edema.  RESPIRATORY: Negative for recent cough and dyspnea.  GASTROINTESTINAL: Negative for abdominal pain, acid reflux symptoms, constipation, diarrhea, nausea and vomiting.  MSK: see HPI INTEGUMENTARY: Negative for rash.  NEUROLOGICAL: Negative for dizziness and headaches.  PSYCHIATRIC: Negative for sleep disturbance and to question depression screen.  Negative for depression, negative for anhedonia.        Objective:    PHYSICAL EXAM:   VS: BP (!) 150/72 (BP Location: Right Arm, Patient Position: Sitting)   Pulse 88   Temp (!) 97.5 F (36.4 C) (Temporal)   Ht 5\' 3"  (1.6 m)   Wt 140 lb (63.5 kg)   SpO2 100%   BMI 24.80 kg/m   GEN: Well nourished, well developed, in no acute distress - in wheelchair  Cardiac: RRR; no murmurs, rubs, or gallops,no edema - no significant varicosities Respiratory:   normal respiratory rate and pattern with no distress - normal breath sounds with no rales, rhonchi, wheezes or rubs  MS: no deformity or atrophy - left shoulder tender to palpation with decreased rom Skin: warm and dry, no rash  Neuro:  Alert and Oriented x 3, Strength and sensation are intact - CN II-Xii grossly intact Psych: euthymic mood, appropriate affect and demeanor  BP (!) 150/72 (BP Location: Right Arm, Patient Position: Sitting)  Pulse 88   Temp (!) 97.5 F (36.4 C) (Temporal)   Ht 5\' 3"  (1.6 m)   Wt 140 lb (63.5 kg)   SpO2 100%   BMI 24.80 kg/m  Wt Readings from Last 3 Encounters:  10/26/19 140 lb (63.5 kg)  10/16/19 142 lb 3.2 oz (64.5 kg)  10/02/19 143 lb (64.9 kg)     Health Maintenance Due  Topic Date Due  . COVID-19 Vaccine (1) Never done  . TETANUS/TDAP  Never done  . DEXA SCAN  Never done  . PNA vac Low Risk Adult (1 of 2 - PCV13) Never done    There are no preventive care reminders to display for this patient.  No results found for: TSH Lab Results  Component Value Date   WBC 7.4 10/14/2019   HGB 13.0 10/14/2019   HCT 42.9 10/14/2019   MCV 89.0 10/14/2019   PLT 197 10/14/2019   Lab Results  Component Value Date   NA 134 (L) 10/14/2019   K 4.1 10/14/2019   CO2 30 10/14/2019   GLUCOSE 104 (H) 10/14/2019   BUN 6 (L) 10/14/2019   CREATININE 0.50 10/14/2019   BILITOT 0.6 10/13/2019   ALKPHOS 76 10/13/2019   AST 15 10/13/2019   ALT 10 10/13/2019   PROT 6.5 10/13/2019   ALBUMIN 3.1 (L) 10/13/2019   CALCIUM 8.8 (L) 10/14/2019   ANIONGAP 7 10/14/2019   No results found for: CHOL No results found for: HDL No results found for: LDLCALC No results found for: TRIG No results found for: CHOLHDL No results found for: HGBA1C    Assessment & Plan:   Problem List Items Addressed This Visit      Genitourinary   Acute cystitis with hematuria - Primary    To get ua and bring back to office tomorrow for recheck        Other   Adnexal mass     Recommend GYN referral but pt refuses at this time --- will let us know if she will see at later time      Bladder mass    Recommend referral to urology - pt states will let us know tomorrow who she wants to see      Acute pain of left shoulder    PT ordered through her home health         Meds ordered this encounter  Medications  . ferrous sulfate 325 (65 FE) MG tablet    Sig: Take 1 tablet (325 mg total) by mouth daily with breakfast.    Dispense:  90 tablet    Refill:  1    Order Specific Question:   Supervising Provider    AnswerShelton Silvas    Follow-up: Return in about 2 months (around 12/26/2019) for chronic fasting follow up.    SARA R Prestyn Stanco, PA-C

## 2019-10-26 NOTE — Assessment & Plan Note (Signed)
Recommend referral to urology - pt states will let us know tomorrow who she wants to see

## 2019-10-26 NOTE — Assessment & Plan Note (Signed)
Recommend GYN referral but pt refuses at this time --- will let us know if she will see at later time

## 2019-10-26 NOTE — Assessment & Plan Note (Signed)
To get ua and bring back to office tomorrow for recheck

## 2019-10-26 NOTE — Assessment & Plan Note (Signed)
PT ordered through her home health

## 2019-10-27 ENCOUNTER — Ambulatory Visit (INDEPENDENT_AMBULATORY_CARE_PROVIDER_SITE_OTHER): Payer: Medicare Other

## 2019-10-27 ENCOUNTER — Other Ambulatory Visit: Payer: Self-pay | Admitting: Physician Assistant

## 2019-10-27 DIAGNOSIS — N3001 Acute cystitis with hematuria: Secondary | ICD-10-CM | POA: Diagnosis not present

## 2019-10-27 DIAGNOSIS — Z9981 Dependence on supplemental oxygen: Secondary | ICD-10-CM | POA: Diagnosis not present

## 2019-10-27 DIAGNOSIS — R339 Retention of urine, unspecified: Secondary | ICD-10-CM | POA: Diagnosis not present

## 2019-10-27 DIAGNOSIS — N3289 Other specified disorders of bladder: Secondary | ICD-10-CM

## 2019-10-27 DIAGNOSIS — Z9181 History of falling: Secondary | ICD-10-CM | POA: Diagnosis not present

## 2019-10-27 DIAGNOSIS — R600 Localized edema: Secondary | ICD-10-CM | POA: Diagnosis not present

## 2019-10-27 DIAGNOSIS — T17920D Food in respiratory tract, part unspecified causing asphyxiation, subsequent encounter: Secondary | ICD-10-CM | POA: Diagnosis not present

## 2019-10-27 DIAGNOSIS — I480 Paroxysmal atrial fibrillation: Secondary | ICD-10-CM | POA: Diagnosis not present

## 2019-10-27 DIAGNOSIS — Z8616 Personal history of COVID-19: Secondary | ICD-10-CM | POA: Diagnosis not present

## 2019-10-27 DIAGNOSIS — N390 Urinary tract infection, site not specified: Secondary | ICD-10-CM | POA: Diagnosis not present

## 2019-10-27 DIAGNOSIS — J439 Emphysema, unspecified: Secondary | ICD-10-CM | POA: Diagnosis not present

## 2019-10-27 DIAGNOSIS — K219 Gastro-esophageal reflux disease without esophagitis: Secondary | ICD-10-CM | POA: Diagnosis not present

## 2019-10-27 DIAGNOSIS — Z86711 Personal history of pulmonary embolism: Secondary | ICD-10-CM | POA: Diagnosis not present

## 2019-10-27 DIAGNOSIS — J449 Chronic obstructive pulmonary disease, unspecified: Secondary | ICD-10-CM | POA: Diagnosis not present

## 2019-10-27 DIAGNOSIS — J9611 Chronic respiratory failure with hypoxia: Secondary | ICD-10-CM | POA: Diagnosis not present

## 2019-10-27 DIAGNOSIS — D509 Iron deficiency anemia, unspecified: Secondary | ICD-10-CM | POA: Diagnosis not present

## 2019-10-27 DIAGNOSIS — Z7901 Long term (current) use of anticoagulants: Secondary | ICD-10-CM | POA: Diagnosis not present

## 2019-10-27 DIAGNOSIS — B9620 Unspecified Escherichia coli [E. coli] as the cause of diseases classified elsewhere: Secondary | ICD-10-CM | POA: Diagnosis not present

## 2019-10-27 DIAGNOSIS — E785 Hyperlipidemia, unspecified: Secondary | ICD-10-CM | POA: Diagnosis not present

## 2019-10-27 DIAGNOSIS — S32020D Wedge compression fracture of second lumbar vertebra, subsequent encounter for fracture with routine healing: Secondary | ICD-10-CM | POA: Diagnosis not present

## 2019-10-27 DIAGNOSIS — J690 Pneumonitis due to inhalation of food and vomit: Secondary | ICD-10-CM | POA: Diagnosis not present

## 2019-10-27 LAB — POCT URINALYSIS DIPSTICK
Bilirubin, UA: NEGATIVE
Blood, UA: POSITIVE
Glucose, UA: NEGATIVE
Ketones, UA: NEGATIVE
Leukocytes, UA: NEGATIVE
Nitrite, UA: NEGATIVE
Protein, UA: NEGATIVE
Spec Grav, UA: 1.015 (ref 1.010–1.025)
Urobilinogen, UA: NEGATIVE E.U./dL — AB
pH, UA: 7 (ref 5.0–8.0)

## 2019-10-27 MED ORDER — NITROFURANTOIN MONOHYD MACRO 100 MG PO CAPS
100.0000 mg | ORAL_CAPSULE | Freq: Two times a day (BID) | ORAL | 0 refills | Status: DC
Start: 2019-10-27 — End: 2020-01-04

## 2019-10-27 NOTE — Patient Instructions (Signed)
Urine was still positive for blood, sending urine culture and provider will send in antibiotics to the pharmacy. Sent ambulatory referral to Urology. [place]

## 2019-10-29 LAB — URINE CULTURE

## 2019-10-30 DIAGNOSIS — Z7901 Long term (current) use of anticoagulants: Secondary | ICD-10-CM | POA: Diagnosis not present

## 2019-10-30 DIAGNOSIS — J9611 Chronic respiratory failure with hypoxia: Secondary | ICD-10-CM | POA: Diagnosis not present

## 2019-10-30 DIAGNOSIS — J690 Pneumonitis due to inhalation of food and vomit: Secondary | ICD-10-CM | POA: Diagnosis not present

## 2019-10-30 DIAGNOSIS — B9620 Unspecified Escherichia coli [E. coli] as the cause of diseases classified elsewhere: Secondary | ICD-10-CM | POA: Diagnosis not present

## 2019-10-30 DIAGNOSIS — K219 Gastro-esophageal reflux disease without esophagitis: Secondary | ICD-10-CM | POA: Diagnosis not present

## 2019-10-30 DIAGNOSIS — J439 Emphysema, unspecified: Secondary | ICD-10-CM | POA: Diagnosis not present

## 2019-10-30 DIAGNOSIS — Z9181 History of falling: Secondary | ICD-10-CM | POA: Diagnosis not present

## 2019-10-30 DIAGNOSIS — Z86711 Personal history of pulmonary embolism: Secondary | ICD-10-CM | POA: Diagnosis not present

## 2019-10-30 DIAGNOSIS — I480 Paroxysmal atrial fibrillation: Secondary | ICD-10-CM | POA: Diagnosis not present

## 2019-10-30 DIAGNOSIS — Z8616 Personal history of COVID-19: Secondary | ICD-10-CM | POA: Diagnosis not present

## 2019-10-30 DIAGNOSIS — E785 Hyperlipidemia, unspecified: Secondary | ICD-10-CM | POA: Diagnosis not present

## 2019-10-30 DIAGNOSIS — N390 Urinary tract infection, site not specified: Secondary | ICD-10-CM | POA: Diagnosis not present

## 2019-10-30 DIAGNOSIS — R600 Localized edema: Secondary | ICD-10-CM | POA: Diagnosis not present

## 2019-10-30 DIAGNOSIS — D509 Iron deficiency anemia, unspecified: Secondary | ICD-10-CM | POA: Diagnosis not present

## 2019-10-30 DIAGNOSIS — S32020D Wedge compression fracture of second lumbar vertebra, subsequent encounter for fracture with routine healing: Secondary | ICD-10-CM | POA: Diagnosis not present

## 2019-10-30 DIAGNOSIS — T17920D Food in respiratory tract, part unspecified causing asphyxiation, subsequent encounter: Secondary | ICD-10-CM | POA: Diagnosis not present

## 2019-10-30 DIAGNOSIS — R339 Retention of urine, unspecified: Secondary | ICD-10-CM | POA: Diagnosis not present

## 2019-10-30 DIAGNOSIS — J449 Chronic obstructive pulmonary disease, unspecified: Secondary | ICD-10-CM | POA: Diagnosis not present

## 2019-10-30 DIAGNOSIS — Z9981 Dependence on supplemental oxygen: Secondary | ICD-10-CM | POA: Diagnosis not present

## 2019-10-31 DIAGNOSIS — I480 Paroxysmal atrial fibrillation: Secondary | ICD-10-CM | POA: Diagnosis not present

## 2019-10-31 DIAGNOSIS — T17920D Food in respiratory tract, part unspecified causing asphyxiation, subsequent encounter: Secondary | ICD-10-CM | POA: Diagnosis not present

## 2019-10-31 DIAGNOSIS — S32020D Wedge compression fracture of second lumbar vertebra, subsequent encounter for fracture with routine healing: Secondary | ICD-10-CM | POA: Diagnosis not present

## 2019-10-31 DIAGNOSIS — Z86711 Personal history of pulmonary embolism: Secondary | ICD-10-CM | POA: Diagnosis not present

## 2019-10-31 DIAGNOSIS — Z7901 Long term (current) use of anticoagulants: Secondary | ICD-10-CM | POA: Diagnosis not present

## 2019-10-31 DIAGNOSIS — J449 Chronic obstructive pulmonary disease, unspecified: Secondary | ICD-10-CM | POA: Diagnosis not present

## 2019-10-31 DIAGNOSIS — J690 Pneumonitis due to inhalation of food and vomit: Secondary | ICD-10-CM | POA: Diagnosis not present

## 2019-10-31 DIAGNOSIS — K219 Gastro-esophageal reflux disease without esophagitis: Secondary | ICD-10-CM | POA: Diagnosis not present

## 2019-10-31 DIAGNOSIS — Z9981 Dependence on supplemental oxygen: Secondary | ICD-10-CM | POA: Diagnosis not present

## 2019-10-31 DIAGNOSIS — J439 Emphysema, unspecified: Secondary | ICD-10-CM | POA: Diagnosis not present

## 2019-10-31 DIAGNOSIS — Z8616 Personal history of COVID-19: Secondary | ICD-10-CM | POA: Diagnosis not present

## 2019-10-31 DIAGNOSIS — Z9181 History of falling: Secondary | ICD-10-CM | POA: Diagnosis not present

## 2019-10-31 DIAGNOSIS — B9620 Unspecified Escherichia coli [E. coli] as the cause of diseases classified elsewhere: Secondary | ICD-10-CM | POA: Diagnosis not present

## 2019-10-31 DIAGNOSIS — N390 Urinary tract infection, site not specified: Secondary | ICD-10-CM | POA: Diagnosis not present

## 2019-10-31 DIAGNOSIS — D509 Iron deficiency anemia, unspecified: Secondary | ICD-10-CM | POA: Diagnosis not present

## 2019-10-31 DIAGNOSIS — E785 Hyperlipidemia, unspecified: Secondary | ICD-10-CM | POA: Diagnosis not present

## 2019-10-31 DIAGNOSIS — J9611 Chronic respiratory failure with hypoxia: Secondary | ICD-10-CM | POA: Diagnosis not present

## 2019-10-31 DIAGNOSIS — R339 Retention of urine, unspecified: Secondary | ICD-10-CM | POA: Diagnosis not present

## 2019-10-31 DIAGNOSIS — R600 Localized edema: Secondary | ICD-10-CM | POA: Diagnosis not present

## 2019-11-01 ENCOUNTER — Ambulatory Visit: Payer: Medicare Other | Admitting: Cardiology

## 2019-11-01 ENCOUNTER — Other Ambulatory Visit: Payer: Self-pay

## 2019-11-01 ENCOUNTER — Encounter: Payer: Self-pay | Admitting: Cardiology

## 2019-11-01 VITALS — BP 160/82 | HR 69 | Ht 63.0 in | Wt 137.0 lb

## 2019-11-01 DIAGNOSIS — I480 Paroxysmal atrial fibrillation: Secondary | ICD-10-CM

## 2019-11-01 DIAGNOSIS — Z86711 Personal history of pulmonary embolism: Secondary | ICD-10-CM

## 2019-11-01 DIAGNOSIS — J431 Panlobular emphysema: Secondary | ICD-10-CM | POA: Diagnosis not present

## 2019-11-01 MED ORDER — DRONEDARONE HCL 400 MG PO TABS: 400.0000 mg | ORAL_TABLET | Freq: Two times a day (BID) | ORAL | 6 refills | Status: DC

## 2019-11-01 NOTE — Progress Notes (Signed)
Cardiology Office Note:    Date:  11/01/2019   ID:  Tina Jimenez, DOB 12-25-1940, MRN [id number]  PCP:  Marge Duncans, PA-C  Cardiologist:  Jenean Lindau, MD   Referring MD: Marge Duncans, PA-C    ASSESSMENT:    1. Paroxysmal atrial fibrillation (H[facility])   2. Panlobular emphysema ([place])   3. History of pulmonary embolism    PLAN:    In order of problems listed above:  1. Primary prevention stressed with the patient.  Importance of compliance with diet medication stressed and she vocalized understanding. 2. Paroxysmal atrial fibrillation:I discussed with the patient atrial fibrillation, disease process. Management and therapy including rate and rhythm control, anticoagulation benefits and potential risks were discussed extensively with the patient. Patient had multiple questions which were answered to patient's satisfaction. 3. Nonsustained ventricular tachycardia: In view of this I reviewed recent blood work and it was fine.  Patient will have a Lexiscan test to make sure that there is no evidence of ischemia.  Echocardiogram will be done to assess murmur heard on auscultation and also assess wall motion and ejection fraction. 4. History of pulmonary rhythm: On anticoagulation and tolerating well. 5. In view of paroxysmal atrial fibrillation I will initiate her on Multaq 400 mg twice daily.  Benefits and potential is explained and she vocalized understanding.  Patient and her son had multiple questions which were answered to their satisfaction. 6. Patient will be seen in follow-up appointment in 6 months or earlier if the patient has any concerns    Medication Adjustments/Labs and Tests Ordered: Current medicines are reviewed at length with the patient today.  Concerns regarding medicines are outlined above.  No orders of the defined types were placed in this encounter.  No orders of the defined types were placed in this encounter.    Chief Complaint  Patient presents with    . Follow-up    Monitor     History of Present Illness:    Tina DELEEUW is a 79 y.o. female.  Patient has past medical history of pulmonary embolism, approximately fibrillation and is on anticoagulation.  She has COPD on oxygen and is recovering from Covid pneumonia.  She denies any problems at this time and takes care of activities of daily living.  She is very sedentary and brought in by her son in a wheelchair.  Her event monitor was abnormal and revealed significant paroxysms of atrial fibrillation which were sustained to up to less than 40s.  She also had nonsustained ventricular tachycardia on the monitor and I wanted to evaluate her and discussed these findings with her today.  She complains of palpitations occasionally.  No chest pain orthopnea PND or syncope.  At the time of my evaluation, the patient is alert awake oriented and in no distress.  Past Medical History:  Diagnosis Date  . Atrial fibrillation ([place])   . GERD (gastroesophageal reflux disease)   . Hyperlipemia     Past Surgical History:  Procedure Laterality Date  . APPENDECTOMY    . KYPHOPLASTY      Current Medications: Current Meds  Medication Sig  . apixaban (ELIQUIS) 5 MG TABS tablet Take 5 mg by mouth 2 (two) times daily.  . carvedilol (COREG) 6.25 MG tablet Take 6.25 mg by mouth 2 (two) times daily.  Marland Kitchen Co-Enzyme Q-10 100 MG CAPS Take 100 mg by mouth daily.  . digoxin (LANOXIN) 0.125 MG tablet TAKE 1 TABLET BY MOUTH DAILY  . ezetimibe (ZETIA) 10 MG  tablet Take 10 mg by mouth daily.   . famotidine (PEPCID) 40 MG tablet Take 40 mg by mouth at bedtime.  . ferrous sulfate 325 (65 FE) MG tablet Take 1 tablet (325 mg total) by mouth daily with breakfast.  . fluticasone (FLONASE) 50 MCG/ACT nasal spray Place 2 sprays into both nostrils daily.  . fluticasone furoate-vilanterol (BREO ELLIPTA) 100-25 MCG/INH AEPB Inhale 1 puff into the lungs daily.   . furosemide (LASIX) 20 MG tablet TAKE 1/2 - 1 TABLET BY MOUTH  DAILY AS NEEDED FOR EDEMA  . ipratropium-albuterol (DUONEB) 0.5-2.5 (3) MG/3ML SOLN Take 3 mLs by nebulization in the morning, at noon, in the evening, and at bedtime.   Marland Kitchen loratadine (CLARITIN) 10 MG tablet TAKE 1 TABLET BY MOUTH ONCE DAILY FOR ALLERGIES  . nitrofurantoin, macrocrystal-monohydrate, (MACROBID) 100 MG capsule Take 1 capsule (100 mg total) by mouth 2 (two) times daily.  Marland Kitchen omeprazole (PRILOSEC) 20 MG capsule Take 20 mg by mouth daily.   . polyethylene glycol (MIRALAX / GLYCOLAX) 17 g packet Take 17 g by mouth daily.     Allergies:   Klonopin [clonazepam], Lipitor [atorvastatin], and Simvastatin   Social History   Socioeconomic History  . Marital status: Widowed    Spouse name: Not on file  . Number of children: 4  . Years of education: Not on file  . Highest education level: Not on file  Occupational History  . Occupation: retired  Tobacco Use  . Smoking status: Former Smoker    Quit date: 11/01/2018    Years since quitting: 1.0  . Smokeless tobacco: Never Used  Substance and Sexual Activity  . Alcohol use: Never  . Drug use: Never  . Sexual activity: Not on file  Other Topics Concern  . Not on file  Social History Narrative  . Not on file   Social Determinants of Health   Financial Resource Strain:   . Difficulty of Paying Living Expenses:   Food Insecurity:   . Worried About Charity fundraiser in the Last Year:   . Arboriculturist in the Last Year:   Transportation Needs:   . Film/video editor (Medical):   Marland Kitchen Lack of Transportation (Non-Medical):   Physical Activity:   . Days of Exercise per Week:   . Minutes of Exercise per Session:   Stress:   . Feeling of Stress :   Social Connections:   . Frequency of Communication with Friends and Family:   . Frequency of Social Gatherings with Friends and Family:   . Attends Religious Services:   . Active Member of Clubs or Organizations:   . Attends Archivist Meetings:   Marland Kitchen Marital Status:       Family History: The patient's family history includes Breast cancer in her sister; Lung cancer in her daughter and sister.  ROS:   Please see the history of present illness.    All other systems reviewed and are negative.  EKGs/Labs/Other Studies Reviewed:    The following studies were reviewed today: Study Highlights  Asja, Stake [birth year], MRN [id number]  EVENT MONITOR REPORT:   Patient was monitored from 09/22/2019 to 10/16/2019. Indication:                    Atrial fibrillation Ordering physician:  Jenean Lindau, MD  Referring physician:        Jenean Lindau, MD    Baseline rhythm: Sinus  Minimum heart rate: 52  BPM.  Average heart rate: 98 BPM.  Maximal heart rate 119 BPM.  Atrial arrhythmia: Atrial runs and 12.3 seconds SVT.  Also paroxysm of atrial fibrillation which lasted more than 3 days at an average heart rate of 102  Ventricular arrhythmia: PVCs seen.  9 beat ventricular tachycardia nonsustained Conduction abnormality: None significant  Symptoms: None significant   Conclusion:  Paroxysmal of atrial fibrillation which lasted 3 days and 20 hours.  Atrial runs were also noted Nonsustained ventricular tachycardia which lasted 9 beats. Abnormal event monitoring.  Interpreting  cardiologist: Reita Cliche       Recent Labs: 10/13/2019: ALT 10 10/14/2019: BUN 6; Creatinine, Ser 0.50; Hemoglobin 13.0; Platelets 197; Potassium 4.1; Sodium 134  Recent Lipid Panel No results found for: CHOL, TRIG, HDL, CHOLHDL, VLDL, LDLCALC, LDLDIRECT  Physical Exam:    VS:  BP (!) 160/82 (BP Location: Right Arm, Patient Position: Sitting, Cuff Size: Normal)   Pulse 69   Ht 5\' 3"  (1.6 m)   Wt 137 lb (62.1 kg)   SpO2 99%   BMI 24.27 kg/m     Wt Readings from Last 3 Encounters:  11/01/19 137 lb (62.1 kg)  10/26/19 140 lb (63.5 kg)  10/16/19 142 lb 3.2 oz (64.5 kg)     GEN: Patient is in no acute distress HEENT: Normal NECK: No JVD; No carotid  bruits LYMPHATICS: No lymphadenopathy CARDIAC: Hear sounds regular, 2/6 systolic murmur at the apex. RESPIRATORY:  Clear to auscultation without rales, wheezing or rhonchi  ABDOMEN: Soft, non-tender, non-distended MUSCULOSKELETAL:  No edema; No deformity  SKIN: Warm and dry NEUROLOGIC:  Alert and oriented x 3 PSYCHIATRIC:  Normal affect   Signed, Jenean Lindau, MD  11/01/2019 11:02 AM    [place]

## 2019-11-01 NOTE — Patient Instructions (Signed)
Medication Instructions:  Your physician has recommended you make the following change in your medication: Start Multaq 400 mg twice daily.  *If you need a refill on your cardiac medications before your next appointment, please call your pharmacy*   Lab Work: None ordered If you have labs (blood work) drawn today and your tests are completely normal, you will receive your results only by: Marland Kitchen MyChart Message (if you have MyChart) OR . A paper copy in the mail If you have any lab test that is abnormal or we need to change your treatment, we will call you to review the results.   Testing/Procedures: Your physician has requested that you have a lexiscan myoview. For further information please visit [email or web address]. Please follow instruction sheet, as given.  The test will take approximately 3 to 4 hours to complete; you may bring reading material.  If someone comes with you to your appointment, they will need to remain in the main lobby due to limited space in the testing area. **If you are pregnant or breastfeeding, please notify the nuclear lab prior to your appointment**  How to prepare for your Myocardial Perfusion Test: . Do not eat or drink 3 hours prior to your test, except you may have water. . Do not consume products containing caffeine (regular or decaffeinated) 12 hours prior to your test. (ex: coffee, chocolate, sodas, tea). . Do bring a list of your current medications with you.  If not listed below, you may take your medications as normal. . Do wear comfortable clothes (no dresses or overalls) and walking shoes, tennis shoes preferred (No heels or open toe shoes are allowed). . Do NOT wear cologne, perfume, aftershave, or lotions (deodorant is allowed). . If these instructions are not followed, your test will have to be rescheduled.   Echocardiography is a painless test that uses sound waves to create images of your heart. It provides your doctor with information about the  size and shape of your heart and how well your heart's chambers and valves are working. This procedure takes approximately one hour. There are no restrictions for this procedure.     Follow-Up: At [facility], you and your health needs are our priority.  As part of our continuing mission to provide you with exceptional heart care, we have created designated Provider Care Teams.  These Care Teams include your primary Cardiologist (physician) and Advanced Practice Providers (APPs -  Physician Assistants and Nurse Practitioners) who all work together to provide you with the care you need, when you need it.  We recommend signing up for the patient portal called "MyChart".  Sign up information is provided on this After Visit Summary.  MyChart is used to connect with patients for Virtual Visits (Telemedicine).  Patients are able to view lab/test results, encounter notes, upcoming appointments, etc.  Non-urgent messages can be sent to your provider as well.   To learn more about what you can do with MyChart, go to [email or web address].    Your next appointment:   2 month(s)  The format for your next appointment:   In Person  Provider:   Jyl Heinz, MD   Other Instructions Dronedarone tablets What is this medicine? DRONEDARONE (droe NE da rone) is an antiarrhythmic drug. It helps make your heart beat regularly. This medicine may be used for other purposes; ask your health care provider or pharmacist if you have questions. COMMON BRAND NAME(S): Multaq What should I tell my health care provider before I take this medicine? They  need to know if you have any of these conditions:  heart failure  history of irregular heartbeat  liver disease  liver or lung problems with the past use of amiodarone  low levels of magnesium in the blood  low levels of potassium in the blood  other heart disease  an unusual or allergic reaction to dronedarone, other medicines, foods, dyes, or  preservatives  pregnant or trying to get pregnant  breast-feeding How should I use this medicine? Take this medicine by mouth with a glass of water. Follow the directions on the prescription label. Take one tablet with the morning meal and one tablet with the evening meal. Do not take your medicine more often than directed. Do not stop taking except on the advice of your doctor or health care professional. A special MedGuide will be given to you by the pharmacist with each prescription and refill. Be sure to read this information carefully each time. Talk to your pediatrician regarding the use of this medicine in children. Special care may be needed. Overdosage: If you think you have taken too much of this medicine contact a poison control center or emergency room at once. NOTE: This medicine is only for you. Do not share this medicine with others. What if I miss a dose? If you miss a dose, take it as soon as you can. If it is almost time for your next dose, take only that dose. Do not take double or extra doses. What may interact with this medicine? Do not take this medicine with any of the following medications:  arsenic trioxide  certain antibiotics like clarithromycin, erythromycin, pentamidine, telithromycin, troleandomycin  certain medicines for depression like tricyclic antidepressants  certain medicines for fungal infections like fluconazole, itraconazole, ketoconazole, posaconazole, voriconazole  certain medicines for irregular heart beat like amiodarone, disopyramide, flecainide, ibutilide, quinidine, propafenone, sotalol  certain medicines for malaria like chloroquine, halofantrine  cisapride  cyclosporine  droperidol  haloperidol  methadone  other medicines that prolong the QT interval (cause an abnormal heart rhythm)  pimozide  nefazodone  phenothiazines like chlorpromazine, mesoridazine, prochlorperazine, thioridazine  ritonavir  ziprasidone This  medicine may also interact with the following medications:  certain medicines for blood pressure, heart disease, or irregular heart beat like diltiazem, metoprolol, propranolol, verapamil  certain medicines for cholesterol like atorvastatin, lovastatin, simvastatin  certain medicines for seizures like carbamazepine, phenobarbital, phenytoin  digoxin  dofetilide  grapefruit juice  rifampin  sirolimus  St. John's Wort  tacrolimus This list may not describe all possible interactions. Give your health care provider a list of all the medicines, herbs, non-prescription drugs, or dietary supplements you use. Also tell them if you smoke, drink alcohol, or use illegal drugs. Some items may interact with your medicine. What should I watch for while using this medicine? Your condition will be monitored closely when you first begin therapy. Often, this drug is first started in a hospital or other monitored health care setting. Once you are on maintenance therapy, visit your doctor or health care professional for regular checks on your progress. Because your condition and use of this medicine carry some risk, it is a good idea to carry an identification card, necklace or bracelet with details of your condition, medications, and doctor or health care professional. Dennis Bast may get drowsy or dizzy. Do not drive, use machinery, or do anything that needs mental alertness until you know how this medicine affects you. Do not stand or sit up quickly, especially if you are an older  patient. This reduces the risk of dizzy or fainting spells. What side effects may I notice from receiving this medicine? Side effects that you should report to your doctor or health care professional as soon as possible:  allergic reactions like skin rash, itching or hives, swelling of the face, lips, or tongue  breathing problems  cough  dark urine  fast, irregular heartbeat  general ill feeling or flu-like  symptoms  light-colored stools  loss of appetite, nausea  right upper belly pain  slow heartbeat  stomach pain  swelling of the legs or ankles  unusually weak or tired  weight gain  yellowing of the eyes or skin Side effects that usually do not require medical attention (report to your doctor or health care professional if they continue or are bothersome):  nausea  vomiting  stomach pain This list may not describe all possible side effects. Call your doctor for medical advice about side effects. You may report side effects to FDA at 1-800-FDA-1088. Where should I keep my medicine? Keep out of the reach of children. Store at room temperature between 15 and 30 degrees C (59 and 86 degrees F). Throw away any unused medicine after the expiration date. NOTE: This sheet is a summary. It may not cover all possible information. If you have questions about this medicine, talk to your doctor, pharmacist, or health care provider.  2020 Elsevier/Gold Standard (2018-05-23 10:43:10)  Echocardiogram An echocardiogram is a procedure that uses painless sound waves (ultrasound) to produce an image of the heart. Images from an echocardiogram can provide important information about:  Signs of coronary artery disease (CAD).  Aneurysm detection. An aneurysm is a weak or damaged part of an artery wall that bulges out from the normal force of blood pumping through the body.  Heart size and shape. Changes in the size or shape of the heart can be associated with certain conditions, including heart failure, aneurysm, and CAD.  Heart muscle function.  Heart valve function.  Signs of a past heart attack.  Fluid buildup around the heart.  Thickening of the heart muscle.  A tumor or infectious growth around the heart valves. Tell a health care provider about:  Any allergies you have.  All medicines you are taking, including vitamins, herbs, eye drops, creams, and over-the-counter  medicines.  Any blood disorders you have.  Any surgeries you have had.  Any medical conditions you have.  Whether you are pregnant or may be pregnant. What are the risks? Generally, this is a safe procedure. However, problems may occur, including:  Allergic reaction to dye (contrast) that may be used during the procedure. What happens before the procedure? No specific preparation is needed. You may eat and drink normally. What happens during the procedure?   An IV tube may be inserted into one of your veins.  You may receive contrast through this tube. A contrast is an injection that improves the quality of the pictures from your heart.  A gel will be applied to your chest.  A wand-like tool (transducer) will be moved over your chest. The gel will help to transmit the sound waves from the transducer.  The sound waves will harmlessly bounce off of your heart to allow the heart images to be captured in real-time motion. The images will be recorded on a computer. The procedure may vary among health care providers and hospitals. What happens after the procedure?  You may return to your normal, everyday life, including diet, activities, and medicines,  unless your health care provider tells you not to do that. Summary  An echocardiogram is a procedure that uses painless sound waves (ultrasound) to produce an image of the heart.  Images from an echocardiogram can provide important information about the size and shape of your heart, heart muscle function, heart valve function, and fluid buildup around your heart.  You do not need to do anything to prepare before this procedure. You may eat and drink normally.  After the echocardiogram is completed, you may return to your normal, everyday life, unless your health care provider tells you not to do that. This information is not intended to replace advice given to you by your health care provider. Make sure you discuss any questions you  have with your health care provider. Document Revised: 09/22/2018 Document Reviewed: 07/04/2016 Elsevier Patient Education  [place].

## 2019-11-02 DIAGNOSIS — Z86711 Personal history of pulmonary embolism: Secondary | ICD-10-CM | POA: Diagnosis not present

## 2019-11-02 DIAGNOSIS — B9620 Unspecified Escherichia coli [E. coli] as the cause of diseases classified elsewhere: Secondary | ICD-10-CM | POA: Diagnosis not present

## 2019-11-02 DIAGNOSIS — R339 Retention of urine, unspecified: Secondary | ICD-10-CM | POA: Diagnosis not present

## 2019-11-02 DIAGNOSIS — R600 Localized edema: Secondary | ICD-10-CM | POA: Diagnosis not present

## 2019-11-02 DIAGNOSIS — Z8616 Personal history of COVID-19: Secondary | ICD-10-CM | POA: Diagnosis not present

## 2019-11-02 DIAGNOSIS — K219 Gastro-esophageal reflux disease without esophagitis: Secondary | ICD-10-CM | POA: Diagnosis not present

## 2019-11-02 DIAGNOSIS — D509 Iron deficiency anemia, unspecified: Secondary | ICD-10-CM | POA: Diagnosis not present

## 2019-11-02 DIAGNOSIS — S32020D Wedge compression fracture of second lumbar vertebra, subsequent encounter for fracture with routine healing: Secondary | ICD-10-CM | POA: Diagnosis not present

## 2019-11-02 DIAGNOSIS — J439 Emphysema, unspecified: Secondary | ICD-10-CM | POA: Diagnosis not present

## 2019-11-02 DIAGNOSIS — I480 Paroxysmal atrial fibrillation: Secondary | ICD-10-CM | POA: Diagnosis not present

## 2019-11-02 DIAGNOSIS — J9611 Chronic respiratory failure with hypoxia: Secondary | ICD-10-CM | POA: Diagnosis not present

## 2019-11-02 DIAGNOSIS — Z7901 Long term (current) use of anticoagulants: Secondary | ICD-10-CM | POA: Diagnosis not present

## 2019-11-02 DIAGNOSIS — J690 Pneumonitis due to inhalation of food and vomit: Secondary | ICD-10-CM | POA: Diagnosis not present

## 2019-11-02 DIAGNOSIS — E785 Hyperlipidemia, unspecified: Secondary | ICD-10-CM | POA: Diagnosis not present

## 2019-11-02 DIAGNOSIS — Z9181 History of falling: Secondary | ICD-10-CM | POA: Diagnosis not present

## 2019-11-02 DIAGNOSIS — N390 Urinary tract infection, site not specified: Secondary | ICD-10-CM | POA: Diagnosis not present

## 2019-11-02 DIAGNOSIS — Z9981 Dependence on supplemental oxygen: Secondary | ICD-10-CM | POA: Diagnosis not present

## 2019-11-02 DIAGNOSIS — J449 Chronic obstructive pulmonary disease, unspecified: Secondary | ICD-10-CM | POA: Diagnosis not present

## 2019-11-02 DIAGNOSIS — T17920D Food in respiratory tract, part unspecified causing asphyxiation, subsequent encounter: Secondary | ICD-10-CM | POA: Diagnosis not present

## 2019-11-03 DIAGNOSIS — N390 Urinary tract infection, site not specified: Secondary | ICD-10-CM | POA: Diagnosis not present

## 2019-11-03 DIAGNOSIS — J439 Emphysema, unspecified: Secondary | ICD-10-CM | POA: Diagnosis not present

## 2019-11-03 DIAGNOSIS — B9620 Unspecified Escherichia coli [E. coli] as the cause of diseases classified elsewhere: Secondary | ICD-10-CM | POA: Diagnosis not present

## 2019-11-06 ENCOUNTER — Other Ambulatory Visit: Payer: Self-pay | Admitting: Physician Assistant

## 2019-11-06 DIAGNOSIS — K219 Gastro-esophageal reflux disease without esophagitis: Secondary | ICD-10-CM | POA: Diagnosis not present

## 2019-11-06 DIAGNOSIS — J449 Chronic obstructive pulmonary disease, unspecified: Secondary | ICD-10-CM | POA: Diagnosis not present

## 2019-11-06 DIAGNOSIS — I480 Paroxysmal atrial fibrillation: Secondary | ICD-10-CM | POA: Diagnosis not present

## 2019-11-06 DIAGNOSIS — T17920D Food in respiratory tract, part unspecified causing asphyxiation, subsequent encounter: Secondary | ICD-10-CM | POA: Diagnosis not present

## 2019-11-06 DIAGNOSIS — S32020D Wedge compression fracture of second lumbar vertebra, subsequent encounter for fracture with routine healing: Secondary | ICD-10-CM | POA: Diagnosis not present

## 2019-11-06 DIAGNOSIS — Z8616 Personal history of COVID-19: Secondary | ICD-10-CM | POA: Diagnosis not present

## 2019-11-06 DIAGNOSIS — B9620 Unspecified Escherichia coli [E. coli] as the cause of diseases classified elsewhere: Secondary | ICD-10-CM | POA: Diagnosis not present

## 2019-11-06 DIAGNOSIS — R600 Localized edema: Secondary | ICD-10-CM | POA: Diagnosis not present

## 2019-11-06 DIAGNOSIS — N390 Urinary tract infection, site not specified: Secondary | ICD-10-CM | POA: Diagnosis not present

## 2019-11-06 DIAGNOSIS — R339 Retention of urine, unspecified: Secondary | ICD-10-CM | POA: Diagnosis not present

## 2019-11-06 DIAGNOSIS — Z9981 Dependence on supplemental oxygen: Secondary | ICD-10-CM | POA: Diagnosis not present

## 2019-11-06 DIAGNOSIS — J690 Pneumonitis due to inhalation of food and vomit: Secondary | ICD-10-CM | POA: Diagnosis not present

## 2019-11-06 DIAGNOSIS — J439 Emphysema, unspecified: Secondary | ICD-10-CM | POA: Diagnosis not present

## 2019-11-06 DIAGNOSIS — E785 Hyperlipidemia, unspecified: Secondary | ICD-10-CM | POA: Diagnosis not present

## 2019-11-06 DIAGNOSIS — Z86711 Personal history of pulmonary embolism: Secondary | ICD-10-CM | POA: Diagnosis not present

## 2019-11-06 DIAGNOSIS — J9611 Chronic respiratory failure with hypoxia: Secondary | ICD-10-CM | POA: Diagnosis not present

## 2019-11-06 DIAGNOSIS — D509 Iron deficiency anemia, unspecified: Secondary | ICD-10-CM | POA: Diagnosis not present

## 2019-11-06 DIAGNOSIS — Z7901 Long term (current) use of anticoagulants: Secondary | ICD-10-CM | POA: Diagnosis not present

## 2019-11-06 DIAGNOSIS — Z9181 History of falling: Secondary | ICD-10-CM | POA: Diagnosis not present

## 2019-11-07 ENCOUNTER — Telehealth: Payer: Self-pay | Admitting: Cardiology

## 2019-11-07 NOTE — Telephone Encounter (Signed)
Called patient back. She reports that she was contacted about upcoming appointments. I do not see any appointments in chart. Will route to [place], Therapist, sports for further advisement. Patient was supposed to be scheduled at [facility] for stress test and echocardiogram.

## 2019-11-07 NOTE — Telephone Encounter (Signed)
Pt is aware of her upcoming appts at [facility]. Pt verbalized her echo is 11/08/19 at 9:30 and lexiscan is 11/09/19 at 7:30. Pt is aware that she is to be NPO after midnight for the lexiscan. Pt had no additional questions.

## 2019-11-07 NOTE — Telephone Encounter (Signed)
Patient states that she got a message last night about having an appointment on Wednesday and Thursday. I don't see where patient has any appointments. She had an echo and stress test scheduled for 11/16/19 but they were cancelled due to having them done at [facility]. I'm not sure who called patient but advised I would reach out to see if Dr. Julien Nordmann nurse was trying to get in touch with her about further scheduling.

## 2019-11-08 ENCOUNTER — Telehealth: Payer: Self-pay | Admitting: Cardiology

## 2019-11-08 DIAGNOSIS — Z9981 Dependence on supplemental oxygen: Secondary | ICD-10-CM | POA: Diagnosis not present

## 2019-11-08 DIAGNOSIS — N390 Urinary tract infection, site not specified: Secondary | ICD-10-CM | POA: Diagnosis not present

## 2019-11-08 DIAGNOSIS — J690 Pneumonitis due to inhalation of food and vomit: Secondary | ICD-10-CM | POA: Diagnosis not present

## 2019-11-08 DIAGNOSIS — D509 Iron deficiency anemia, unspecified: Secondary | ICD-10-CM | POA: Diagnosis not present

## 2019-11-08 DIAGNOSIS — I480 Paroxysmal atrial fibrillation: Secondary | ICD-10-CM | POA: Diagnosis not present

## 2019-11-08 DIAGNOSIS — Z9181 History of falling: Secondary | ICD-10-CM | POA: Diagnosis not present

## 2019-11-08 DIAGNOSIS — Z8616 Personal history of COVID-19: Secondary | ICD-10-CM | POA: Diagnosis not present

## 2019-11-08 DIAGNOSIS — R339 Retention of urine, unspecified: Secondary | ICD-10-CM | POA: Diagnosis not present

## 2019-11-08 DIAGNOSIS — E785 Hyperlipidemia, unspecified: Secondary | ICD-10-CM | POA: Diagnosis not present

## 2019-11-08 DIAGNOSIS — K219 Gastro-esophageal reflux disease without esophagitis: Secondary | ICD-10-CM | POA: Diagnosis not present

## 2019-11-08 DIAGNOSIS — Z86711 Personal history of pulmonary embolism: Secondary | ICD-10-CM | POA: Diagnosis not present

## 2019-11-08 DIAGNOSIS — T17920D Food in respiratory tract, part unspecified causing asphyxiation, subsequent encounter: Secondary | ICD-10-CM | POA: Diagnosis not present

## 2019-11-08 DIAGNOSIS — J9611 Chronic respiratory failure with hypoxia: Secondary | ICD-10-CM | POA: Diagnosis not present

## 2019-11-08 DIAGNOSIS — J439 Emphysema, unspecified: Secondary | ICD-10-CM | POA: Diagnosis not present

## 2019-11-08 DIAGNOSIS — R600 Localized edema: Secondary | ICD-10-CM | POA: Diagnosis not present

## 2019-11-08 DIAGNOSIS — B9620 Unspecified Escherichia coli [E. coli] as the cause of diseases classified elsewhere: Secondary | ICD-10-CM | POA: Diagnosis not present

## 2019-11-08 DIAGNOSIS — J449 Chronic obstructive pulmonary disease, unspecified: Secondary | ICD-10-CM | POA: Diagnosis not present

## 2019-11-08 DIAGNOSIS — S32020D Wedge compression fracture of second lumbar vertebra, subsequent encounter for fracture with routine healing: Secondary | ICD-10-CM | POA: Diagnosis not present

## 2019-11-08 DIAGNOSIS — Z7901 Long term (current) use of anticoagulants: Secondary | ICD-10-CM | POA: Diagnosis not present

## 2019-11-08 NOTE — Telephone Encounter (Signed)
Richardson Landry from [facility] calling to speak with a nurse. He states the patient is coming in for an echo, but has already had on done recently. Transferred call to the nurse.

## 2019-11-08 NOTE — Telephone Encounter (Signed)
Spoke with Richardson Landry and the echo has been cancelled per Dr. Geraldo Pitter. Echo that was done in 3/21 was printed.

## 2019-11-09 ENCOUNTER — Encounter: Payer: Self-pay | Admitting: Cardiology

## 2019-11-09 DIAGNOSIS — J449 Chronic obstructive pulmonary disease, unspecified: Secondary | ICD-10-CM | POA: Diagnosis not present

## 2019-11-09 DIAGNOSIS — I480 Paroxysmal atrial fibrillation: Secondary | ICD-10-CM | POA: Diagnosis not present

## 2019-11-09 DIAGNOSIS — I4891 Unspecified atrial fibrillation: Secondary | ICD-10-CM | POA: Diagnosis not present

## 2019-11-10 DIAGNOSIS — I480 Paroxysmal atrial fibrillation: Secondary | ICD-10-CM | POA: Diagnosis not present

## 2019-11-10 DIAGNOSIS — J449 Chronic obstructive pulmonary disease, unspecified: Secondary | ICD-10-CM | POA: Diagnosis not present

## 2019-11-10 DIAGNOSIS — Z7901 Long term (current) use of anticoagulants: Secondary | ICD-10-CM | POA: Diagnosis not present

## 2019-11-10 DIAGNOSIS — Z9981 Dependence on supplemental oxygen: Secondary | ICD-10-CM | POA: Diagnosis not present

## 2019-11-10 DIAGNOSIS — J690 Pneumonitis due to inhalation of food and vomit: Secondary | ICD-10-CM | POA: Diagnosis not present

## 2019-11-10 DIAGNOSIS — J439 Emphysema, unspecified: Secondary | ICD-10-CM | POA: Diagnosis not present

## 2019-11-10 DIAGNOSIS — N390 Urinary tract infection, site not specified: Secondary | ICD-10-CM | POA: Diagnosis not present

## 2019-11-10 DIAGNOSIS — J9611 Chronic respiratory failure with hypoxia: Secondary | ICD-10-CM | POA: Diagnosis not present

## 2019-11-10 DIAGNOSIS — S32020D Wedge compression fracture of second lumbar vertebra, subsequent encounter for fracture with routine healing: Secondary | ICD-10-CM | POA: Diagnosis not present

## 2019-11-10 DIAGNOSIS — T17920D Food in respiratory tract, part unspecified causing asphyxiation, subsequent encounter: Secondary | ICD-10-CM | POA: Diagnosis not present

## 2019-11-10 DIAGNOSIS — E785 Hyperlipidemia, unspecified: Secondary | ICD-10-CM | POA: Diagnosis not present

## 2019-11-10 DIAGNOSIS — K219 Gastro-esophageal reflux disease without esophagitis: Secondary | ICD-10-CM | POA: Diagnosis not present

## 2019-11-10 DIAGNOSIS — Z8616 Personal history of COVID-19: Secondary | ICD-10-CM | POA: Diagnosis not present

## 2019-11-10 DIAGNOSIS — R339 Retention of urine, unspecified: Secondary | ICD-10-CM | POA: Diagnosis not present

## 2019-11-10 DIAGNOSIS — R600 Localized edema: Secondary | ICD-10-CM | POA: Diagnosis not present

## 2019-11-10 DIAGNOSIS — Z86711 Personal history of pulmonary embolism: Secondary | ICD-10-CM | POA: Diagnosis not present

## 2019-11-10 DIAGNOSIS — D509 Iron deficiency anemia, unspecified: Secondary | ICD-10-CM | POA: Diagnosis not present

## 2019-11-10 DIAGNOSIS — Z9181 History of falling: Secondary | ICD-10-CM | POA: Diagnosis not present

## 2019-11-10 DIAGNOSIS — B9620 Unspecified Escherichia coli [E. coli] as the cause of diseases classified elsewhere: Secondary | ICD-10-CM | POA: Diagnosis not present

## 2019-11-14 DIAGNOSIS — Z86711 Personal history of pulmonary embolism: Secondary | ICD-10-CM | POA: Diagnosis not present

## 2019-11-14 DIAGNOSIS — N390 Urinary tract infection, site not specified: Secondary | ICD-10-CM | POA: Diagnosis not present

## 2019-11-14 DIAGNOSIS — J439 Emphysema, unspecified: Secondary | ICD-10-CM | POA: Diagnosis not present

## 2019-11-14 DIAGNOSIS — J9611 Chronic respiratory failure with hypoxia: Secondary | ICD-10-CM | POA: Diagnosis not present

## 2019-11-14 DIAGNOSIS — Z8616 Personal history of COVID-19: Secondary | ICD-10-CM | POA: Diagnosis not present

## 2019-11-14 DIAGNOSIS — Z9181 History of falling: Secondary | ICD-10-CM | POA: Diagnosis not present

## 2019-11-14 DIAGNOSIS — K219 Gastro-esophageal reflux disease without esophagitis: Secondary | ICD-10-CM | POA: Diagnosis not present

## 2019-11-14 DIAGNOSIS — E785 Hyperlipidemia, unspecified: Secondary | ICD-10-CM | POA: Diagnosis not present

## 2019-11-14 DIAGNOSIS — I480 Paroxysmal atrial fibrillation: Secondary | ICD-10-CM | POA: Diagnosis not present

## 2019-11-14 DIAGNOSIS — B9620 Unspecified Escherichia coli [E. coli] as the cause of diseases classified elsewhere: Secondary | ICD-10-CM | POA: Diagnosis not present

## 2019-11-14 DIAGNOSIS — Z9981 Dependence on supplemental oxygen: Secondary | ICD-10-CM | POA: Diagnosis not present

## 2019-11-14 DIAGNOSIS — Z7901 Long term (current) use of anticoagulants: Secondary | ICD-10-CM | POA: Diagnosis not present

## 2019-11-14 DIAGNOSIS — R600 Localized edema: Secondary | ICD-10-CM | POA: Diagnosis not present

## 2019-11-14 DIAGNOSIS — S32020D Wedge compression fracture of second lumbar vertebra, subsequent encounter for fracture with routine healing: Secondary | ICD-10-CM | POA: Diagnosis not present

## 2019-11-14 DIAGNOSIS — R339 Retention of urine, unspecified: Secondary | ICD-10-CM | POA: Diagnosis not present

## 2019-11-14 DIAGNOSIS — D509 Iron deficiency anemia, unspecified: Secondary | ICD-10-CM | POA: Diagnosis not present

## 2019-11-15 DIAGNOSIS — Z8616 Personal history of COVID-19: Secondary | ICD-10-CM | POA: Diagnosis not present

## 2019-11-15 DIAGNOSIS — S32020D Wedge compression fracture of second lumbar vertebra, subsequent encounter for fracture with routine healing: Secondary | ICD-10-CM | POA: Diagnosis not present

## 2019-11-15 DIAGNOSIS — B9620 Unspecified Escherichia coli [E. coli] as the cause of diseases classified elsewhere: Secondary | ICD-10-CM | POA: Diagnosis not present

## 2019-11-15 DIAGNOSIS — Z9181 History of falling: Secondary | ICD-10-CM | POA: Diagnosis not present

## 2019-11-15 DIAGNOSIS — Z7901 Long term (current) use of anticoagulants: Secondary | ICD-10-CM | POA: Diagnosis not present

## 2019-11-15 DIAGNOSIS — J439 Emphysema, unspecified: Secondary | ICD-10-CM | POA: Diagnosis not present

## 2019-11-15 DIAGNOSIS — J9611 Chronic respiratory failure with hypoxia: Secondary | ICD-10-CM | POA: Diagnosis not present

## 2019-11-15 DIAGNOSIS — Z86711 Personal history of pulmonary embolism: Secondary | ICD-10-CM | POA: Diagnosis not present

## 2019-11-15 DIAGNOSIS — D509 Iron deficiency anemia, unspecified: Secondary | ICD-10-CM | POA: Diagnosis not present

## 2019-11-15 DIAGNOSIS — R600 Localized edema: Secondary | ICD-10-CM | POA: Diagnosis not present

## 2019-11-15 DIAGNOSIS — R339 Retention of urine, unspecified: Secondary | ICD-10-CM | POA: Diagnosis not present

## 2019-11-15 DIAGNOSIS — K219 Gastro-esophageal reflux disease without esophagitis: Secondary | ICD-10-CM | POA: Diagnosis not present

## 2019-11-15 DIAGNOSIS — Z9981 Dependence on supplemental oxygen: Secondary | ICD-10-CM | POA: Diagnosis not present

## 2019-11-15 DIAGNOSIS — I480 Paroxysmal atrial fibrillation: Secondary | ICD-10-CM | POA: Diagnosis not present

## 2019-11-15 DIAGNOSIS — E785 Hyperlipidemia, unspecified: Secondary | ICD-10-CM | POA: Diagnosis not present

## 2019-11-15 DIAGNOSIS — N390 Urinary tract infection, site not specified: Secondary | ICD-10-CM | POA: Diagnosis not present

## 2019-11-16 ENCOUNTER — Other Ambulatory Visit: Payer: Medicare Other

## 2019-11-16 DIAGNOSIS — R600 Localized edema: Secondary | ICD-10-CM | POA: Diagnosis not present

## 2019-11-16 DIAGNOSIS — Z7901 Long term (current) use of anticoagulants: Secondary | ICD-10-CM | POA: Diagnosis not present

## 2019-11-16 DIAGNOSIS — Z9981 Dependence on supplemental oxygen: Secondary | ICD-10-CM | POA: Diagnosis not present

## 2019-11-16 DIAGNOSIS — J439 Emphysema, unspecified: Secondary | ICD-10-CM | POA: Diagnosis not present

## 2019-11-16 DIAGNOSIS — B9620 Unspecified Escherichia coli [E. coli] as the cause of diseases classified elsewhere: Secondary | ICD-10-CM | POA: Diagnosis not present

## 2019-11-16 DIAGNOSIS — R339 Retention of urine, unspecified: Secondary | ICD-10-CM | POA: Diagnosis not present

## 2019-11-16 DIAGNOSIS — Z86711 Personal history of pulmonary embolism: Secondary | ICD-10-CM | POA: Diagnosis not present

## 2019-11-16 DIAGNOSIS — K219 Gastro-esophageal reflux disease without esophagitis: Secondary | ICD-10-CM | POA: Diagnosis not present

## 2019-11-16 DIAGNOSIS — E785 Hyperlipidemia, unspecified: Secondary | ICD-10-CM | POA: Diagnosis not present

## 2019-11-16 DIAGNOSIS — Z9181 History of falling: Secondary | ICD-10-CM | POA: Diagnosis not present

## 2019-11-16 DIAGNOSIS — I480 Paroxysmal atrial fibrillation: Secondary | ICD-10-CM | POA: Diagnosis not present

## 2019-11-16 DIAGNOSIS — N390 Urinary tract infection, site not specified: Secondary | ICD-10-CM | POA: Diagnosis not present

## 2019-11-16 DIAGNOSIS — Z8616 Personal history of COVID-19: Secondary | ICD-10-CM | POA: Diagnosis not present

## 2019-11-16 DIAGNOSIS — D509 Iron deficiency anemia, unspecified: Secondary | ICD-10-CM | POA: Diagnosis not present

## 2019-11-16 DIAGNOSIS — J9611 Chronic respiratory failure with hypoxia: Secondary | ICD-10-CM | POA: Diagnosis not present

## 2019-11-16 DIAGNOSIS — S32020D Wedge compression fracture of second lumbar vertebra, subsequent encounter for fracture with routine healing: Secondary | ICD-10-CM | POA: Diagnosis not present

## 2019-11-20 DIAGNOSIS — S32020D Wedge compression fracture of second lumbar vertebra, subsequent encounter for fracture with routine healing: Secondary | ICD-10-CM | POA: Diagnosis not present

## 2019-11-20 DIAGNOSIS — R339 Retention of urine, unspecified: Secondary | ICD-10-CM | POA: Diagnosis not present

## 2019-11-20 DIAGNOSIS — Z9181 History of falling: Secondary | ICD-10-CM | POA: Diagnosis not present

## 2019-11-20 DIAGNOSIS — Z7901 Long term (current) use of anticoagulants: Secondary | ICD-10-CM | POA: Diagnosis not present

## 2019-11-20 DIAGNOSIS — D509 Iron deficiency anemia, unspecified: Secondary | ICD-10-CM | POA: Diagnosis not present

## 2019-11-20 DIAGNOSIS — K219 Gastro-esophageal reflux disease without esophagitis: Secondary | ICD-10-CM | POA: Diagnosis not present

## 2019-11-20 DIAGNOSIS — Z8616 Personal history of COVID-19: Secondary | ICD-10-CM | POA: Diagnosis not present

## 2019-11-20 DIAGNOSIS — J9611 Chronic respiratory failure with hypoxia: Secondary | ICD-10-CM | POA: Diagnosis not present

## 2019-11-20 DIAGNOSIS — N390 Urinary tract infection, site not specified: Secondary | ICD-10-CM | POA: Diagnosis not present

## 2019-11-20 DIAGNOSIS — J439 Emphysema, unspecified: Secondary | ICD-10-CM | POA: Diagnosis not present

## 2019-11-20 DIAGNOSIS — Z86711 Personal history of pulmonary embolism: Secondary | ICD-10-CM | POA: Diagnosis not present

## 2019-11-20 DIAGNOSIS — R600 Localized edema: Secondary | ICD-10-CM | POA: Diagnosis not present

## 2019-11-20 DIAGNOSIS — Z9981 Dependence on supplemental oxygen: Secondary | ICD-10-CM | POA: Diagnosis not present

## 2019-11-20 DIAGNOSIS — B9620 Unspecified Escherichia coli [E. coli] as the cause of diseases classified elsewhere: Secondary | ICD-10-CM | POA: Diagnosis not present

## 2019-11-20 DIAGNOSIS — E785 Hyperlipidemia, unspecified: Secondary | ICD-10-CM | POA: Diagnosis not present

## 2019-11-20 DIAGNOSIS — I480 Paroxysmal atrial fibrillation: Secondary | ICD-10-CM | POA: Diagnosis not present

## 2019-11-21 ENCOUNTER — Encounter: Payer: Self-pay | Admitting: Physician Assistant

## 2019-11-21 DIAGNOSIS — R600 Localized edema: Secondary | ICD-10-CM | POA: Diagnosis not present

## 2019-11-21 DIAGNOSIS — J439 Emphysema, unspecified: Secondary | ICD-10-CM | POA: Diagnosis not present

## 2019-11-21 DIAGNOSIS — D509 Iron deficiency anemia, unspecified: Secondary | ICD-10-CM | POA: Diagnosis not present

## 2019-11-21 DIAGNOSIS — S32020D Wedge compression fracture of second lumbar vertebra, subsequent encounter for fracture with routine healing: Secondary | ICD-10-CM | POA: Diagnosis not present

## 2019-11-21 DIAGNOSIS — B9620 Unspecified Escherichia coli [E. coli] as the cause of diseases classified elsewhere: Secondary | ICD-10-CM | POA: Diagnosis not present

## 2019-11-21 DIAGNOSIS — Z9181 History of falling: Secondary | ICD-10-CM | POA: Diagnosis not present

## 2019-11-21 DIAGNOSIS — J9611 Chronic respiratory failure with hypoxia: Secondary | ICD-10-CM | POA: Diagnosis not present

## 2019-11-21 DIAGNOSIS — K219 Gastro-esophageal reflux disease without esophagitis: Secondary | ICD-10-CM | POA: Diagnosis not present

## 2019-11-21 DIAGNOSIS — Z7901 Long term (current) use of anticoagulants: Secondary | ICD-10-CM | POA: Diagnosis not present

## 2019-11-21 DIAGNOSIS — Z8616 Personal history of COVID-19: Secondary | ICD-10-CM | POA: Diagnosis not present

## 2019-11-21 DIAGNOSIS — Z9981 Dependence on supplemental oxygen: Secondary | ICD-10-CM | POA: Diagnosis not present

## 2019-11-21 DIAGNOSIS — N390 Urinary tract infection, site not specified: Secondary | ICD-10-CM | POA: Diagnosis not present

## 2019-11-21 DIAGNOSIS — E785 Hyperlipidemia, unspecified: Secondary | ICD-10-CM | POA: Diagnosis not present

## 2019-11-21 DIAGNOSIS — I480 Paroxysmal atrial fibrillation: Secondary | ICD-10-CM | POA: Diagnosis not present

## 2019-11-21 DIAGNOSIS — R339 Retention of urine, unspecified: Secondary | ICD-10-CM | POA: Diagnosis not present

## 2019-11-21 DIAGNOSIS — Z86711 Personal history of pulmonary embolism: Secondary | ICD-10-CM | POA: Diagnosis not present

## 2019-11-22 ENCOUNTER — Other Ambulatory Visit: Payer: Self-pay

## 2019-11-22 DIAGNOSIS — Z7901 Long term (current) use of anticoagulants: Secondary | ICD-10-CM | POA: Diagnosis not present

## 2019-11-22 DIAGNOSIS — N390 Urinary tract infection, site not specified: Secondary | ICD-10-CM | POA: Diagnosis not present

## 2019-11-22 DIAGNOSIS — B9620 Unspecified Escherichia coli [E. coli] as the cause of diseases classified elsewhere: Secondary | ICD-10-CM | POA: Diagnosis not present

## 2019-11-22 DIAGNOSIS — E785 Hyperlipidemia, unspecified: Secondary | ICD-10-CM | POA: Diagnosis not present

## 2019-11-22 DIAGNOSIS — Z9981 Dependence on supplemental oxygen: Secondary | ICD-10-CM | POA: Diagnosis not present

## 2019-11-22 DIAGNOSIS — R339 Retention of urine, unspecified: Secondary | ICD-10-CM | POA: Diagnosis not present

## 2019-11-22 DIAGNOSIS — Z8616 Personal history of COVID-19: Secondary | ICD-10-CM | POA: Diagnosis not present

## 2019-11-22 DIAGNOSIS — S32020D Wedge compression fracture of second lumbar vertebra, subsequent encounter for fracture with routine healing: Secondary | ICD-10-CM | POA: Diagnosis not present

## 2019-11-22 DIAGNOSIS — J439 Emphysema, unspecified: Secondary | ICD-10-CM | POA: Diagnosis not present

## 2019-11-22 DIAGNOSIS — Z86711 Personal history of pulmonary embolism: Secondary | ICD-10-CM | POA: Diagnosis not present

## 2019-11-22 DIAGNOSIS — I480 Paroxysmal atrial fibrillation: Secondary | ICD-10-CM | POA: Diagnosis not present

## 2019-11-22 DIAGNOSIS — R600 Localized edema: Secondary | ICD-10-CM | POA: Diagnosis not present

## 2019-11-22 DIAGNOSIS — J9611 Chronic respiratory failure with hypoxia: Secondary | ICD-10-CM | POA: Diagnosis not present

## 2019-11-22 DIAGNOSIS — Z9181 History of falling: Secondary | ICD-10-CM | POA: Diagnosis not present

## 2019-11-22 DIAGNOSIS — K219 Gastro-esophageal reflux disease without esophagitis: Secondary | ICD-10-CM | POA: Diagnosis not present

## 2019-11-22 DIAGNOSIS — D509 Iron deficiency anemia, unspecified: Secondary | ICD-10-CM | POA: Diagnosis not present

## 2019-11-22 MED ORDER — OMEPRAZOLE 20 MG PO CPDR: 20.0000 mg | DELAYED_RELEASE_CAPSULE | Freq: Every day | ORAL | 0 refills | Status: DC

## 2019-11-23 DIAGNOSIS — R339 Retention of urine, unspecified: Secondary | ICD-10-CM | POA: Diagnosis not present

## 2019-11-23 DIAGNOSIS — K219 Gastro-esophageal reflux disease without esophagitis: Secondary | ICD-10-CM | POA: Diagnosis not present

## 2019-11-23 DIAGNOSIS — Z9181 History of falling: Secondary | ICD-10-CM | POA: Diagnosis not present

## 2019-11-23 DIAGNOSIS — J9611 Chronic respiratory failure with hypoxia: Secondary | ICD-10-CM | POA: Diagnosis not present

## 2019-11-23 DIAGNOSIS — Z86711 Personal history of pulmonary embolism: Secondary | ICD-10-CM | POA: Diagnosis not present

## 2019-11-23 DIAGNOSIS — S32020D Wedge compression fracture of second lumbar vertebra, subsequent encounter for fracture with routine healing: Secondary | ICD-10-CM | POA: Diagnosis not present

## 2019-11-23 DIAGNOSIS — I480 Paroxysmal atrial fibrillation: Secondary | ICD-10-CM | POA: Diagnosis not present

## 2019-11-23 DIAGNOSIS — Z8616 Personal history of COVID-19: Secondary | ICD-10-CM | POA: Diagnosis not present

## 2019-11-23 DIAGNOSIS — D509 Iron deficiency anemia, unspecified: Secondary | ICD-10-CM | POA: Diagnosis not present

## 2019-11-23 DIAGNOSIS — B9620 Unspecified Escherichia coli [E. coli] as the cause of diseases classified elsewhere: Secondary | ICD-10-CM | POA: Diagnosis not present

## 2019-11-23 DIAGNOSIS — Z9981 Dependence on supplemental oxygen: Secondary | ICD-10-CM | POA: Diagnosis not present

## 2019-11-23 DIAGNOSIS — J439 Emphysema, unspecified: Secondary | ICD-10-CM | POA: Diagnosis not present

## 2019-11-23 DIAGNOSIS — N390 Urinary tract infection, site not specified: Secondary | ICD-10-CM | POA: Diagnosis not present

## 2019-11-23 DIAGNOSIS — E785 Hyperlipidemia, unspecified: Secondary | ICD-10-CM | POA: Diagnosis not present

## 2019-11-23 DIAGNOSIS — R600 Localized edema: Secondary | ICD-10-CM | POA: Diagnosis not present

## 2019-11-23 DIAGNOSIS — Z7901 Long term (current) use of anticoagulants: Secondary | ICD-10-CM | POA: Diagnosis not present

## 2019-11-28 DIAGNOSIS — J9611 Chronic respiratory failure with hypoxia: Secondary | ICD-10-CM | POA: Diagnosis not present

## 2019-11-28 DIAGNOSIS — B9620 Unspecified Escherichia coli [E. coli] as the cause of diseases classified elsewhere: Secondary | ICD-10-CM | POA: Diagnosis not present

## 2019-11-28 DIAGNOSIS — J439 Emphysema, unspecified: Secondary | ICD-10-CM | POA: Diagnosis not present

## 2019-11-28 DIAGNOSIS — S32020D Wedge compression fracture of second lumbar vertebra, subsequent encounter for fracture with routine healing: Secondary | ICD-10-CM | POA: Diagnosis not present

## 2019-11-28 DIAGNOSIS — Z8616 Personal history of COVID-19: Secondary | ICD-10-CM | POA: Diagnosis not present

## 2019-11-28 DIAGNOSIS — K219 Gastro-esophageal reflux disease without esophagitis: Secondary | ICD-10-CM | POA: Diagnosis not present

## 2019-11-28 DIAGNOSIS — E785 Hyperlipidemia, unspecified: Secondary | ICD-10-CM | POA: Diagnosis not present

## 2019-11-28 DIAGNOSIS — Z9181 History of falling: Secondary | ICD-10-CM | POA: Diagnosis not present

## 2019-11-28 DIAGNOSIS — N390 Urinary tract infection, site not specified: Secondary | ICD-10-CM | POA: Diagnosis not present

## 2019-11-28 DIAGNOSIS — D509 Iron deficiency anemia, unspecified: Secondary | ICD-10-CM | POA: Diagnosis not present

## 2019-11-28 DIAGNOSIS — R339 Retention of urine, unspecified: Secondary | ICD-10-CM | POA: Diagnosis not present

## 2019-11-28 DIAGNOSIS — Z86711 Personal history of pulmonary embolism: Secondary | ICD-10-CM | POA: Diagnosis not present

## 2019-11-28 DIAGNOSIS — R600 Localized edema: Secondary | ICD-10-CM | POA: Diagnosis not present

## 2019-11-28 DIAGNOSIS — Z7901 Long term (current) use of anticoagulants: Secondary | ICD-10-CM | POA: Diagnosis not present

## 2019-11-28 DIAGNOSIS — Z9981 Dependence on supplemental oxygen: Secondary | ICD-10-CM | POA: Diagnosis not present

## 2019-11-28 DIAGNOSIS — I480 Paroxysmal atrial fibrillation: Secondary | ICD-10-CM | POA: Diagnosis not present

## 2019-11-29 DIAGNOSIS — N390 Urinary tract infection, site not specified: Secondary | ICD-10-CM | POA: Diagnosis not present

## 2019-11-29 DIAGNOSIS — I480 Paroxysmal atrial fibrillation: Secondary | ICD-10-CM | POA: Diagnosis not present

## 2019-11-29 DIAGNOSIS — R339 Retention of urine, unspecified: Secondary | ICD-10-CM | POA: Diagnosis not present

## 2019-11-29 DIAGNOSIS — Z8616 Personal history of COVID-19: Secondary | ICD-10-CM | POA: Diagnosis not present

## 2019-11-29 DIAGNOSIS — Z7901 Long term (current) use of anticoagulants: Secondary | ICD-10-CM | POA: Diagnosis not present

## 2019-11-29 DIAGNOSIS — J9611 Chronic respiratory failure with hypoxia: Secondary | ICD-10-CM | POA: Diagnosis not present

## 2019-11-29 DIAGNOSIS — Z9181 History of falling: Secondary | ICD-10-CM | POA: Diagnosis not present

## 2019-11-29 DIAGNOSIS — R600 Localized edema: Secondary | ICD-10-CM | POA: Diagnosis not present

## 2019-11-29 DIAGNOSIS — S32020D Wedge compression fracture of second lumbar vertebra, subsequent encounter for fracture with routine healing: Secondary | ICD-10-CM | POA: Diagnosis not present

## 2019-11-29 DIAGNOSIS — D509 Iron deficiency anemia, unspecified: Secondary | ICD-10-CM | POA: Diagnosis not present

## 2019-11-29 DIAGNOSIS — J439 Emphysema, unspecified: Secondary | ICD-10-CM | POA: Diagnosis not present

## 2019-11-29 DIAGNOSIS — Z9981 Dependence on supplemental oxygen: Secondary | ICD-10-CM | POA: Diagnosis not present

## 2019-11-29 DIAGNOSIS — Z86711 Personal history of pulmonary embolism: Secondary | ICD-10-CM | POA: Diagnosis not present

## 2019-11-29 DIAGNOSIS — E785 Hyperlipidemia, unspecified: Secondary | ICD-10-CM | POA: Diagnosis not present

## 2019-11-29 DIAGNOSIS — B9620 Unspecified Escherichia coli [E. coli] as the cause of diseases classified elsewhere: Secondary | ICD-10-CM | POA: Diagnosis not present

## 2019-11-29 DIAGNOSIS — K219 Gastro-esophageal reflux disease without esophagitis: Secondary | ICD-10-CM | POA: Diagnosis not present

## 2019-12-06 DIAGNOSIS — Z9981 Dependence on supplemental oxygen: Secondary | ICD-10-CM | POA: Diagnosis not present

## 2019-12-06 DIAGNOSIS — Z9181 History of falling: Secondary | ICD-10-CM | POA: Diagnosis not present

## 2019-12-06 DIAGNOSIS — J9611 Chronic respiratory failure with hypoxia: Secondary | ICD-10-CM | POA: Diagnosis not present

## 2019-12-06 DIAGNOSIS — K219 Gastro-esophageal reflux disease without esophagitis: Secondary | ICD-10-CM | POA: Diagnosis not present

## 2019-12-06 DIAGNOSIS — R600 Localized edema: Secondary | ICD-10-CM | POA: Diagnosis not present

## 2019-12-06 DIAGNOSIS — D509 Iron deficiency anemia, unspecified: Secondary | ICD-10-CM | POA: Diagnosis not present

## 2019-12-06 DIAGNOSIS — E785 Hyperlipidemia, unspecified: Secondary | ICD-10-CM | POA: Diagnosis not present

## 2019-12-06 DIAGNOSIS — Z7901 Long term (current) use of anticoagulants: Secondary | ICD-10-CM | POA: Diagnosis not present

## 2019-12-06 DIAGNOSIS — Z8616 Personal history of COVID-19: Secondary | ICD-10-CM | POA: Diagnosis not present

## 2019-12-06 DIAGNOSIS — S32020D Wedge compression fracture of second lumbar vertebra, subsequent encounter for fracture with routine healing: Secondary | ICD-10-CM | POA: Diagnosis not present

## 2019-12-06 DIAGNOSIS — Z86711 Personal history of pulmonary embolism: Secondary | ICD-10-CM | POA: Diagnosis not present

## 2019-12-06 DIAGNOSIS — J439 Emphysema, unspecified: Secondary | ICD-10-CM | POA: Diagnosis not present

## 2019-12-06 DIAGNOSIS — N390 Urinary tract infection, site not specified: Secondary | ICD-10-CM | POA: Diagnosis not present

## 2019-12-06 DIAGNOSIS — I480 Paroxysmal atrial fibrillation: Secondary | ICD-10-CM | POA: Diagnosis not present

## 2019-12-06 DIAGNOSIS — B9620 Unspecified Escherichia coli [E. coli] as the cause of diseases classified elsewhere: Secondary | ICD-10-CM | POA: Diagnosis not present

## 2019-12-06 DIAGNOSIS — R339 Retention of urine, unspecified: Secondary | ICD-10-CM | POA: Diagnosis not present

## 2019-12-08 DIAGNOSIS — N390 Urinary tract infection, site not specified: Secondary | ICD-10-CM | POA: Diagnosis not present

## 2019-12-08 DIAGNOSIS — Z7901 Long term (current) use of anticoagulants: Secondary | ICD-10-CM | POA: Diagnosis not present

## 2019-12-08 DIAGNOSIS — R339 Retention of urine, unspecified: Secondary | ICD-10-CM | POA: Diagnosis not present

## 2019-12-08 DIAGNOSIS — R600 Localized edema: Secondary | ICD-10-CM | POA: Diagnosis not present

## 2019-12-08 DIAGNOSIS — Z9181 History of falling: Secondary | ICD-10-CM | POA: Diagnosis not present

## 2019-12-08 DIAGNOSIS — I480 Paroxysmal atrial fibrillation: Secondary | ICD-10-CM | POA: Diagnosis not present

## 2019-12-08 DIAGNOSIS — K219 Gastro-esophageal reflux disease without esophagitis: Secondary | ICD-10-CM | POA: Diagnosis not present

## 2019-12-08 DIAGNOSIS — S32020D Wedge compression fracture of second lumbar vertebra, subsequent encounter for fracture with routine healing: Secondary | ICD-10-CM | POA: Diagnosis not present

## 2019-12-08 DIAGNOSIS — Z86711 Personal history of pulmonary embolism: Secondary | ICD-10-CM | POA: Diagnosis not present

## 2019-12-08 DIAGNOSIS — B9620 Unspecified Escherichia coli [E. coli] as the cause of diseases classified elsewhere: Secondary | ICD-10-CM | POA: Diagnosis not present

## 2019-12-08 DIAGNOSIS — J439 Emphysema, unspecified: Secondary | ICD-10-CM | POA: Diagnosis not present

## 2019-12-08 DIAGNOSIS — D509 Iron deficiency anemia, unspecified: Secondary | ICD-10-CM | POA: Diagnosis not present

## 2019-12-08 DIAGNOSIS — Z9981 Dependence on supplemental oxygen: Secondary | ICD-10-CM | POA: Diagnosis not present

## 2019-12-08 DIAGNOSIS — J9611 Chronic respiratory failure with hypoxia: Secondary | ICD-10-CM | POA: Diagnosis not present

## 2019-12-08 DIAGNOSIS — Z8616 Personal history of COVID-19: Secondary | ICD-10-CM | POA: Diagnosis not present

## 2019-12-08 DIAGNOSIS — E785 Hyperlipidemia, unspecified: Secondary | ICD-10-CM | POA: Diagnosis not present

## 2019-12-09 ENCOUNTER — Other Ambulatory Visit: Payer: Self-pay | Admitting: Physician Assistant

## 2019-12-10 DIAGNOSIS — J449 Chronic obstructive pulmonary disease, unspecified: Secondary | ICD-10-CM | POA: Diagnosis not present

## 2019-12-11 ENCOUNTER — Other Ambulatory Visit: Payer: Self-pay | Admitting: Physician Assistant

## 2019-12-11 ENCOUNTER — Other Ambulatory Visit: Payer: Self-pay

## 2019-12-11 MED ORDER — IPRATROPIUM-ALBUTEROL 0.5-2.5 (3) MG/3ML IN SOLN: 3.0000 mL | Freq: Four times a day (QID) | RESPIRATORY_TRACT | 1 refills | Status: DC

## 2019-12-11 MED ORDER — CARVEDILOL 6.25 MG PO TABS: 6.2500 mg | ORAL_TABLET | Freq: Two times a day (BID) | ORAL | 0 refills | Status: DC

## 2019-12-12 DIAGNOSIS — S32020D Wedge compression fracture of second lumbar vertebra, subsequent encounter for fracture with routine healing: Secondary | ICD-10-CM | POA: Diagnosis not present

## 2019-12-12 DIAGNOSIS — Z9181 History of falling: Secondary | ICD-10-CM | POA: Diagnosis not present

## 2019-12-12 DIAGNOSIS — B9620 Unspecified Escherichia coli [E. coli] as the cause of diseases classified elsewhere: Secondary | ICD-10-CM | POA: Diagnosis not present

## 2019-12-12 DIAGNOSIS — D509 Iron deficiency anemia, unspecified: Secondary | ICD-10-CM | POA: Diagnosis not present

## 2019-12-12 DIAGNOSIS — K219 Gastro-esophageal reflux disease without esophagitis: Secondary | ICD-10-CM | POA: Diagnosis not present

## 2019-12-12 DIAGNOSIS — R600 Localized edema: Secondary | ICD-10-CM | POA: Diagnosis not present

## 2019-12-12 DIAGNOSIS — I480 Paroxysmal atrial fibrillation: Secondary | ICD-10-CM | POA: Diagnosis not present

## 2019-12-12 DIAGNOSIS — Z8616 Personal history of COVID-19: Secondary | ICD-10-CM | POA: Diagnosis not present

## 2019-12-12 DIAGNOSIS — Z9981 Dependence on supplemental oxygen: Secondary | ICD-10-CM | POA: Diagnosis not present

## 2019-12-12 DIAGNOSIS — Z7901 Long term (current) use of anticoagulants: Secondary | ICD-10-CM | POA: Diagnosis not present

## 2019-12-12 DIAGNOSIS — E785 Hyperlipidemia, unspecified: Secondary | ICD-10-CM | POA: Diagnosis not present

## 2019-12-12 DIAGNOSIS — J9611 Chronic respiratory failure with hypoxia: Secondary | ICD-10-CM | POA: Diagnosis not present

## 2019-12-12 DIAGNOSIS — R339 Retention of urine, unspecified: Secondary | ICD-10-CM | POA: Diagnosis not present

## 2019-12-12 DIAGNOSIS — J439 Emphysema, unspecified: Secondary | ICD-10-CM | POA: Diagnosis not present

## 2019-12-12 DIAGNOSIS — N390 Urinary tract infection, site not specified: Secondary | ICD-10-CM | POA: Diagnosis not present

## 2019-12-12 DIAGNOSIS — Z86711 Personal history of pulmonary embolism: Secondary | ICD-10-CM | POA: Diagnosis not present

## 2019-12-13 DIAGNOSIS — K219 Gastro-esophageal reflux disease without esophagitis: Secondary | ICD-10-CM | POA: Diagnosis not present

## 2019-12-13 DIAGNOSIS — S32020D Wedge compression fracture of second lumbar vertebra, subsequent encounter for fracture with routine healing: Secondary | ICD-10-CM | POA: Diagnosis not present

## 2019-12-13 DIAGNOSIS — D509 Iron deficiency anemia, unspecified: Secondary | ICD-10-CM | POA: Diagnosis not present

## 2019-12-13 DIAGNOSIS — E785 Hyperlipidemia, unspecified: Secondary | ICD-10-CM | POA: Diagnosis not present

## 2019-12-13 DIAGNOSIS — I480 Paroxysmal atrial fibrillation: Secondary | ICD-10-CM | POA: Diagnosis not present

## 2019-12-13 DIAGNOSIS — J439 Emphysema, unspecified: Secondary | ICD-10-CM | POA: Diagnosis not present

## 2019-12-13 DIAGNOSIS — N390 Urinary tract infection, site not specified: Secondary | ICD-10-CM | POA: Diagnosis not present

## 2019-12-13 DIAGNOSIS — Z86711 Personal history of pulmonary embolism: Secondary | ICD-10-CM | POA: Diagnosis not present

## 2019-12-13 DIAGNOSIS — J9611 Chronic respiratory failure with hypoxia: Secondary | ICD-10-CM | POA: Diagnosis not present

## 2019-12-13 DIAGNOSIS — Z7901 Long term (current) use of anticoagulants: Secondary | ICD-10-CM | POA: Diagnosis not present

## 2019-12-13 DIAGNOSIS — Z9181 History of falling: Secondary | ICD-10-CM | POA: Diagnosis not present

## 2019-12-13 DIAGNOSIS — Z8616 Personal history of COVID-19: Secondary | ICD-10-CM | POA: Diagnosis not present

## 2019-12-13 DIAGNOSIS — Z9981 Dependence on supplemental oxygen: Secondary | ICD-10-CM | POA: Diagnosis not present

## 2019-12-13 DIAGNOSIS — R600 Localized edema: Secondary | ICD-10-CM | POA: Diagnosis not present

## 2019-12-13 DIAGNOSIS — B9620 Unspecified Escherichia coli [E. coli] as the cause of diseases classified elsewhere: Secondary | ICD-10-CM | POA: Diagnosis not present

## 2019-12-13 DIAGNOSIS — R339 Retention of urine, unspecified: Secondary | ICD-10-CM | POA: Diagnosis not present

## 2019-12-15 DIAGNOSIS — J9611 Chronic respiratory failure with hypoxia: Secondary | ICD-10-CM | POA: Diagnosis not present

## 2019-12-15 DIAGNOSIS — B9620 Unspecified Escherichia coli [E. coli] as the cause of diseases classified elsewhere: Secondary | ICD-10-CM | POA: Diagnosis not present

## 2019-12-15 DIAGNOSIS — R339 Retention of urine, unspecified: Secondary | ICD-10-CM | POA: Diagnosis not present

## 2019-12-15 DIAGNOSIS — N390 Urinary tract infection, site not specified: Secondary | ICD-10-CM | POA: Diagnosis not present

## 2019-12-15 DIAGNOSIS — Z8616 Personal history of COVID-19: Secondary | ICD-10-CM | POA: Diagnosis not present

## 2019-12-15 DIAGNOSIS — Z9981 Dependence on supplemental oxygen: Secondary | ICD-10-CM | POA: Diagnosis not present

## 2019-12-15 DIAGNOSIS — J439 Emphysema, unspecified: Secondary | ICD-10-CM | POA: Diagnosis not present

## 2019-12-15 DIAGNOSIS — D509 Iron deficiency anemia, unspecified: Secondary | ICD-10-CM | POA: Diagnosis not present

## 2019-12-15 DIAGNOSIS — E785 Hyperlipidemia, unspecified: Secondary | ICD-10-CM | POA: Diagnosis not present

## 2019-12-15 DIAGNOSIS — R600 Localized edema: Secondary | ICD-10-CM | POA: Diagnosis not present

## 2019-12-15 DIAGNOSIS — I480 Paroxysmal atrial fibrillation: Secondary | ICD-10-CM | POA: Diagnosis not present

## 2019-12-15 DIAGNOSIS — Z86711 Personal history of pulmonary embolism: Secondary | ICD-10-CM | POA: Diagnosis not present

## 2019-12-15 DIAGNOSIS — Z7901 Long term (current) use of anticoagulants: Secondary | ICD-10-CM | POA: Diagnosis not present

## 2019-12-15 DIAGNOSIS — S32020D Wedge compression fracture of second lumbar vertebra, subsequent encounter for fracture with routine healing: Secondary | ICD-10-CM | POA: Diagnosis not present

## 2019-12-15 DIAGNOSIS — K219 Gastro-esophageal reflux disease without esophagitis: Secondary | ICD-10-CM | POA: Diagnosis not present

## 2019-12-15 DIAGNOSIS — Z9181 History of falling: Secondary | ICD-10-CM | POA: Diagnosis not present

## 2019-12-19 DIAGNOSIS — Z9981 Dependence on supplemental oxygen: Secondary | ICD-10-CM | POA: Diagnosis not present

## 2019-12-19 DIAGNOSIS — S32020D Wedge compression fracture of second lumbar vertebra, subsequent encounter for fracture with routine healing: Secondary | ICD-10-CM | POA: Diagnosis not present

## 2019-12-19 DIAGNOSIS — Z7901 Long term (current) use of anticoagulants: Secondary | ICD-10-CM | POA: Diagnosis not present

## 2019-12-19 DIAGNOSIS — I480 Paroxysmal atrial fibrillation: Secondary | ICD-10-CM | POA: Diagnosis not present

## 2019-12-19 DIAGNOSIS — J9611 Chronic respiratory failure with hypoxia: Secondary | ICD-10-CM | POA: Diagnosis not present

## 2019-12-19 DIAGNOSIS — R339 Retention of urine, unspecified: Secondary | ICD-10-CM | POA: Diagnosis not present

## 2019-12-19 DIAGNOSIS — R600 Localized edema: Secondary | ICD-10-CM | POA: Diagnosis not present

## 2019-12-19 DIAGNOSIS — Z8616 Personal history of COVID-19: Secondary | ICD-10-CM | POA: Diagnosis not present

## 2019-12-19 DIAGNOSIS — Z9181 History of falling: Secondary | ICD-10-CM | POA: Diagnosis not present

## 2019-12-19 DIAGNOSIS — J439 Emphysema, unspecified: Secondary | ICD-10-CM | POA: Diagnosis not present

## 2019-12-19 DIAGNOSIS — D509 Iron deficiency anemia, unspecified: Secondary | ICD-10-CM | POA: Diagnosis not present

## 2019-12-19 DIAGNOSIS — K219 Gastro-esophageal reflux disease without esophagitis: Secondary | ICD-10-CM | POA: Diagnosis not present

## 2019-12-19 DIAGNOSIS — B9620 Unspecified Escherichia coli [E. coli] as the cause of diseases classified elsewhere: Secondary | ICD-10-CM | POA: Diagnosis not present

## 2019-12-19 DIAGNOSIS — Z86711 Personal history of pulmonary embolism: Secondary | ICD-10-CM | POA: Diagnosis not present

## 2019-12-19 DIAGNOSIS — N390 Urinary tract infection, site not specified: Secondary | ICD-10-CM | POA: Diagnosis not present

## 2019-12-19 DIAGNOSIS — E785 Hyperlipidemia, unspecified: Secondary | ICD-10-CM | POA: Diagnosis not present

## 2019-12-26 ENCOUNTER — Ambulatory Visit: Payer: Medicare Other | Admitting: Physician Assistant

## 2019-12-27 ENCOUNTER — Other Ambulatory Visit: Payer: Self-pay

## 2019-12-28 MED ORDER — BREO ELLIPTA 100-25 MCG/INH IN AEPB: 1.0000 | INHALATION_SPRAY | Freq: Every day | RESPIRATORY_TRACT | 5 refills | Status: DC

## 2020-01-03 ENCOUNTER — Other Ambulatory Visit: Payer: Self-pay | Admitting: Physician Assistant

## 2020-01-04 ENCOUNTER — Encounter: Payer: Self-pay | Admitting: Physician Assistant

## 2020-01-04 ENCOUNTER — Other Ambulatory Visit: Payer: Self-pay

## 2020-01-04 ENCOUNTER — Ambulatory Visit (INDEPENDENT_AMBULATORY_CARE_PROVIDER_SITE_OTHER): Payer: Medicare Other | Admitting: Physician Assistant

## 2020-01-04 VITALS — BP 122/78 | HR 74 | Temp 97.5°F | Ht 68.0 in | Wt 139.0 lb

## 2020-01-04 DIAGNOSIS — Z8709 Personal history of other diseases of the respiratory system: Secondary | ICD-10-CM

## 2020-01-04 DIAGNOSIS — R5383 Other fatigue: Secondary | ICD-10-CM

## 2020-01-04 DIAGNOSIS — I4819 Other persistent atrial fibrillation: Secondary | ICD-10-CM | POA: Diagnosis not present

## 2020-01-04 DIAGNOSIS — D649 Anemia, unspecified: Secondary | ICD-10-CM

## 2020-01-04 DIAGNOSIS — I10 Essential (primary) hypertension: Secondary | ICD-10-CM

## 2020-01-04 MED ORDER — CEFDINIR 300 MG PO CAPS: 300.0000 mg | ORAL_CAPSULE | Freq: Two times a day (BID) | ORAL | 0 refills | Status: DC

## 2020-01-05 LAB — CBC WITH DIFFERENTIAL/PLATELET
Basophils Absolute: 0.1 10*3/uL (ref 0.0–0.2)
Basos: 1 %
EOS (ABSOLUTE): 0.3 10*3/uL (ref 0.0–0.4)
Eos: 3 %
Hematocrit: 37.2 % (ref 34.0–46.6)
Hemoglobin: 11.8 g/dL (ref 11.1–15.9)
Immature Grans (Abs): 0 10*3/uL (ref 0.0–0.1)
Immature Granulocytes: 0 %
Lymphocytes Absolute: 1.2 10*3/uL (ref 0.7–3.1)
Lymphs: 12 %
MCH: 28.5 pg (ref 26.6–33.0)
MCHC: 31.7 g/dL (ref 31.5–35.7)
MCV: 90 fL (ref 79–97)
Monocytes Absolute: 0.8 10*3/uL (ref 0.1–0.9)
Monocytes: 8 %
Neutrophils Absolute: 7.9 10*3/uL — ABNORMAL HIGH (ref 1.4–7.0)
Neutrophils: 76 %
Platelets: 268 10*3/uL (ref 150–450)
RBC: 4.14 x10E6/uL (ref 3.77–5.28)
RDW: 13.6 % (ref 11.7–15.4)
WBC: 10.2 10*3/uL (ref 3.4–10.8)

## 2020-01-05 LAB — COMPREHENSIVE METABOLIC PANEL
ALT: 12 IU/L (ref 0–32)
AST: 15 IU/L (ref 0–40)
Albumin/Globulin Ratio: 1.6 (ref 1.2–2.2)
Albumin: 4.1 g/dL (ref 3.7–4.7)
Alkaline Phosphatase: 96 IU/L (ref 48–121)
BUN/Creatinine Ratio: 18 (ref 12–28)
BUN: 9 mg/dL (ref 8–27)
Bilirubin Total: 0.3 mg/dL (ref 0.0–1.2)
CO2: 30 mmol/L — ABNORMAL HIGH (ref 20–29)
Calcium: 9.5 mg/dL (ref 8.7–10.3)
Chloride: 99 mmol/L (ref 96–106)
Creatinine, Ser: 0.5 mg/dL — ABNORMAL LOW (ref 0.57–1.00)
GFR calc Af Amer: 108 mL/min/{1.73_m2} (ref >59)
GFR calc non Af Amer: 94 mL/min/{1.73_m2} (ref >59)
Globulin, Total: 2.6 g/dL (ref 1.5–4.5)
Glucose: 93 mg/dL (ref 65–99)
Potassium: 4.7 mmol/L (ref 3.5–5.2)
Sodium: 142 mmol/L (ref 134–144)
Total Protein: 6.7 g/dL (ref 6.0–8.5)

## 2020-01-05 LAB — LIPID PANEL
Chol/HDL Ratio: 3.7 ratio (ref 0.0–4.4)
Cholesterol, Total: 204 mg/dL — ABNORMAL HIGH (ref 100–199)
HDL: 55 mg/dL (ref >39)
LDL Chol Calc (NIH): 129 mg/dL — ABNORMAL HIGH (ref 0–99)
Triglycerides: 114 mg/dL (ref 0–149)
VLDL Cholesterol Cal: 20 mg/dL (ref 5–40)

## 2020-01-05 LAB — CARDIOVASCULAR RISK ASSESSMENT

## 2020-01-05 LAB — TSH: TSH: 2.15 u[IU]/mL (ref 0.450–4.500)

## 2020-01-08 ENCOUNTER — Encounter: Payer: Self-pay | Admitting: Physician Assistant

## 2020-01-08 DIAGNOSIS — D649 Anemia, unspecified: Secondary | ICD-10-CM

## 2020-01-08 DIAGNOSIS — R5383 Other fatigue: Secondary | ICD-10-CM

## 2020-01-08 HISTORY — DX: Other fatigue: R53.83

## 2020-01-08 HISTORY — DX: Anemia, unspecified: D64.9

## 2020-01-08 NOTE — Assessment & Plan Note (Signed)
Continue current meds 

## 2020-01-08 NOTE — Assessment & Plan Note (Signed)
labwork pending °Continue current meds °

## 2020-01-08 NOTE — Assessment & Plan Note (Signed)
Use inhalers as directed rx for omnicef as directed

## 2020-01-08 NOTE — Assessment & Plan Note (Signed)
labwork pending 

## 2020-01-08 NOTE — Assessment & Plan Note (Signed)
Well controlled.  ?No changes to medicines.  ?Continue to work on eating a healthy diet and exercise.  ?Labs drawn today.  ?

## 2020-01-08 NOTE — Progress Notes (Signed)
Established Patient Office Visit  Subjective:  Patient ID: Tina Jimenez, female    DOB: 04/05/1941  Age: 79 y.o. MRN: [id number]  CC:  Chief Complaint  Patient presents with  . Hyperlipidemia    HPI JANIAH DEVINNEY presents for follow up COPD and hypoxia and new onset of afib with history of pulmonary emboli  Pt with longstanding history of COPD -  She used to be on 2L oxygen and now after being admitted to hospital for back surgery then subsequently acquiring COVID , new onset of atrial fibrillation and pulmonary emboli she was increased to 5L of oxygen.  She now has weaned herself down to 3L and is doing well - she states her pulse ox ranges 94-97% -  She has had mild cough and congestion for the past few days and cough been slightly productive  Pt had new onset of afib while in hospital several months ago - she is currently on lanoxin 0.125mg  qd, coreg 6.25mg  bid She denies chest pain - does state that since being placed on lasix her swelling has improved  Pt also was found to have anemia -  She has continued to have low hemoglobin but states she 'has been feeling fine' - denies melena, hematochezia PT ADAMANTLY REFUSES GI EVALUATION !!!!! Currently taking daily iron supplement  Pt with history of hyperlipidemia - is due for labwork - currently on zetia 10mg  qd  Past Medical History:  Diagnosis Date  . Atrial fibrillation ([place])   . GERD (gastroesophageal reflux disease)   . Hyperlipemia     Past Surgical History:  Procedure Laterality Date  . APPENDECTOMY    . KYPHOPLASTY      Family History  Problem Relation Age of Onset  . Breast cancer Sister   . Lung cancer Sister   . Lung cancer Daughter     Social History   Socioeconomic History  . Marital status: Widowed    Spouse name: Not on file  . Number of children: 4  . Years of education: Not on file  . Highest education level: Not on file  Occupational History  . Occupation: retired  Tobacco Use    . Smoking status: Former Smoker    Quit date: 11/01/2018    Years since quitting: 1.1  . Smokeless tobacco: Never Used  Vaping Use  . Vaping Use: Never used  Substance and Sexual Activity  . Alcohol use: Never  . Drug use: Never  . Sexual activity: Not on file  Other Topics Concern  . Not on file  Social History Narrative  . Not on file   Social Determinants of Health   Financial Resource Strain:   . Difficulty of Paying Living Expenses:   Food Insecurity:   . Worried About Charity fundraiser in the Last Year:   . Arboriculturist in the Last Year:   Transportation Needs:   . Film/video editor (Medical):   Marland Kitchen Lack of Transportation (Non-Medical):   Physical Activity:   . Days of Exercise per Week:   . Minutes of Exercise per Session:   Stress:   . Feeling of Stress :   Social Connections:   . Frequency of Communication with Friends and Family:   . Frequency of Social Gatherings with Friends and Family:   . Attends Religious Services:   . Active Member of Clubs or Organizations:   . Attends Archivist Meetings:   Marland Kitchen Marital Status:   Intimate Partner Violence:   .  Fear of Current or Ex-Partner:   . Emotionally Abused:   Marland Kitchen Physically Abused:   . Sexually Abused:      Current Outpatient Medications:  .  carvedilol (COREG) 6.25 MG tablet, Take 1 tablet (6.25 mg total) by mouth 2 (two) times daily., Disp: 180 tablet, Rfl: 0 .  Co-Enzyme Q-10 100 MG CAPS, Take 100 mg by mouth daily., Disp: , Rfl:  .  digoxin (LANOXIN) 0.125 MG tablet, TAKE 1 TABLET BY MOUTH DAILY, Disp: 90 tablet, Rfl: 2 .  ezetimibe (ZETIA) 10 MG tablet, Take 10 mg by mouth daily. , Disp: , Rfl:  .  famotidine (PEPCID) 40 MG tablet, Take 40 mg by mouth at bedtime., Disp: , Rfl:  .  ferrous sulfate 325 (65 FE) MG tablet, Take 1 tablet (325 mg total) by mouth daily with breakfast., Disp: 90 tablet, Rfl: 1 .  fluticasone (FLONASE) 50 MCG/ACT nasal spray, Place 2 sprays into both nostrils  daily., Disp: 16 g, Rfl: 6 .  fluticasone furoate-vilanterol (BREO ELLIPTA) 100-25 MCG/INH AEPB, Inhale 1 puff into the lungs daily., Disp: 60 each, Rfl: 5 .  furosemide (LASIX) 20 MG tablet, TAKE 1/2 - 1 TABLET BY MOUTH DAILY AS NEEDED FOR EDEMA, Disp: 30 tablet, Rfl: 0 .  ipratropium-albuterol (DUONEB) 0.5-2.5 (3) MG/3ML SOLN, Take 3 mLs by nebulization in the morning, at noon, in the evening, and at bedtime., Disp: 360 mL, Rfl: 1 .  loratadine (CLARITIN) 10 MG tablet, TAKE 1 TABLET BY MOUTH ONCE DAILY FOR ALLERGIES, Disp: 90 tablet, Rfl: 1 .  omeprazole (PRILOSEC) 20 MG capsule, Take 1 capsule (20 mg total) by mouth daily., Disp: 90 capsule, Rfl: 0 .  polyethylene glycol (MIRALAX / GLYCOLAX) 17 g packet, Take 17 g by mouth daily., Disp: , Rfl:  .  cefdinir (OMNICEF) 300 MG capsule, Take 1 capsule (300 mg total) by mouth 2 (two) times daily., Disp: 20 capsule, Rfl: 0   Allergies  Allergen Reactions  . Klonopin [Clonazepam] Other (See Comments)    Pt can't remember reaction  . Lipitor [Atorvastatin] Other (See Comments)    Pt could barely lift arms  . Simvastatin Other (See Comments)    Pt can't remember reaction    ROS CONSTITUTIONAL: mild fatigue E/N/T: Negative for ear pain, nasal congestion and sore throat.  CARDIOVASCULAR: Negative for chest pain, dizziness, palpitations but does have pedal edema and states she has not taken her lasix RESPIRATORY: Negative for recent cough and dyspnea.  GASTROINTESTINAL: Negative for abdominal pain, acid reflux symptoms, constipation, diarrhea, nausea and vomiting.  MSK: Negative for arthralgias and myalgias.  INTEGUMENTARY: Negative for rash.  NEUROLOGICAL: Negative for dizziness and headaches.  PSYCHIATRIC: Negative for sleep disturbance and to question depression screen.  Negative for depression, negative for anhedonia.        Objective:    PHYSICAL EXAM:   VS: BP 122/78 (BP Location: Left Arm, Patient Position: Sitting)   Pulse 74    Temp (!) 97.5 F (36.4 C) (Temporal)   Ht 5\' 8"  (1.727 m)   Wt 139 lb (63 kg)   SpO2 100%   BMI 21.13 kg/m   GEN: Well nourished, well developed, in no acute distress - sitting in wheelchair HEENT - erythema of pharynx noted Cardiac: RRR; no murmurs, rubs, or gallops,trace edema bilateral feet- no significant varicosities Respiratory:  normal respiratory rate and pattern with no distress - normal breath sounds with no rales, rhonchi, wheezes or rubs Skin: warm and dry, no rash  Neuro:  Alert and Oriented x 3, Strength and sensation are intact - CN II-Xii grossly intact Psych: euthymic mood, appropriate affect and demeanor  BP 122/78 (BP Location: Left Arm, Patient Position: Sitting)   Pulse 74   Temp (!) 97.5 F (36.4 C) (Temporal)   Ht 5\' 8"  (1.727 m)   Wt 139 lb (63 kg)   SpO2 100%   BMI 21.13 kg/m  Wt Readings from Last 3 Encounters:  No data found for Wt     Health Maintenance Due  Topic Date Due  . Hepatitis C Screening  Never done  . COVID-19 Vaccine (1) Never done  . TETANUS/TDAP  Never done  . DEXA SCAN  Never done  . PNA vac Low Risk Adult (1 of 2 - PCV13) Never done    There are no preventive care reminders to display for this patient.  Lab Results  Component Value Date   TSH 2.150 01/04/2020   Lab Results  Component Value Date   WBC 10.2 01/04/2020   HGB 11.8 01/04/2020   HCT 37.2 01/04/2020   MCV 90 01/04/2020   PLT 268 01/04/2020   Lab Results  Component Value Date   NA 142 01/04/2020   K 4.7 01/04/2020   CO2 30 (H) 01/04/2020   GLUCOSE 93 01/04/2020   BUN 9 01/04/2020   CREATININE 0.50 (L) 01/04/2020   BILITOT 0.3 01/04/2020   ALKPHOS 96 01/04/2020   AST 15 01/04/2020   ALT 12 01/04/2020   PROT 6.7 01/04/2020   ALBUMIN 4.1 01/04/2020   CALCIUM 9.5 01/04/2020   ANIONGAP 7 10/14/2019   Lab Results  Component Value Date   CHOL 204 (H) 01/04/2020   Lab Results  Component Value Date   HDL 55 01/04/2020   Lab Results  Component  Value Date   LDLCALC 129 (H) 01/04/2020   Lab Results  Component Value Date   TRIG 114 01/04/2020   Lab Results  Component Value Date   CHOLHDL 3.7 01/04/2020   No results found for: HGBA1C    Assessment & Plan:   Problem List Items Addressed This Visit      Cardiovascular and Mediastinum   Acute pulmonary embolism without acute cor pulmonale (HCC)    Continue eliquis as directed      Relevant Medications   apixaban (ELIQUIS) 5 MG TABS tablet   ezetimibe (ZETIA) 10 MG tablet   carvedilol (COREG) 6.25 MG tablet   furosemide (LASIX) 20 MG tablet   Paroxysmal atrial fibrillation (HCC)    Continue current meds and recommend again referral to cardiology       Relevant Medications   apixaban (ELIQUIS) 5 MG TABS tablet   ezetimibe (ZETIA) 10 MG tablet   carvedilol (COREG) 6.25 MG tablet   furosemide (LASIX) 20 MG tablet                                                 Other   History of COPD - Primary    Continue current meds and continue oxygen at 3L            Abnormal blood chemistry    labwork pending      Relevant Orders   CBC with Differential/Platelet (Completed)   Comprehensive metabolic panel (Completed)   Localized edema    rx for lasix Keep feet elevated as much as possible  Meds ordered this encounter  Medications  . cefdinir (OMNICEF) 300 MG capsule    Sig: Take 1 capsule (300 mg total) by mouth 2 (two) times daily.    Dispense:  20 capsule    Refill:  0    Order Specific Question:   Supervising Provider    Answer:   Shelton Silvas    Follow-up: Return in about 3 months (around 04/05/2020) for chronic fasting follow up.    SARA R Jarrod Bodkins, PA-C

## 2020-01-09 DIAGNOSIS — J449 Chronic obstructive pulmonary disease, unspecified: Secondary | ICD-10-CM | POA: Diagnosis not present

## 2020-01-17 ENCOUNTER — Other Ambulatory Visit: Payer: Self-pay

## 2020-01-17 ENCOUNTER — Ambulatory Visit: Payer: Medicare Other | Admitting: Cardiology

## 2020-01-17 ENCOUNTER — Encounter: Payer: Self-pay | Admitting: Cardiology

## 2020-01-17 VITALS — BP 180/84 | HR 78 | Ht 63.0 in | Wt 141.6 lb

## 2020-01-17 DIAGNOSIS — I480 Paroxysmal atrial fibrillation: Secondary | ICD-10-CM | POA: Diagnosis not present

## 2020-01-17 DIAGNOSIS — I10 Essential (primary) hypertension: Secondary | ICD-10-CM | POA: Diagnosis not present

## 2020-01-17 DIAGNOSIS — Z86711 Personal history of pulmonary embolism: Secondary | ICD-10-CM | POA: Diagnosis not present

## 2020-01-17 NOTE — Patient Instructions (Signed)

## 2020-01-17 NOTE — Progress Notes (Signed)
Cardiology Office Note:    Date:  01/17/2020   ID:  Tina Jimenez, DOB 1940-10-20, MRN [id number]  PCP:  Marge Duncans, PA-C  Cardiologist:  Jenean Lindau, MD   Referring MD: Marge Duncans, PA-C    ASSESSMENT:    1. Paroxysmal atrial fibrillation (HCC)   2. History of pulmonary embolism   3. Essential hypertension    PLAN:    In order of problems listed above:  1. Persistent atrial fibrillation:I discussed with the patient atrial fibrillation, disease process. Management and therapy including rate and rhythm control, anticoagulation benefits and potential risks were discussed extensively with the patient. Patient had multiple questions which were answered to patient's satisfaction.  Patient was on anticoagulation but discontinued because of back surgery and unstable gait.  I will monitor this and consider renewing her restarting this in the future based on her clinical condition.  I discussed this with her at length.  She has taken anticoagulation for short.  Of time recently for pulmonary embolism but this was discontinued after a period of time. 2. Essential hypertension: Home blood pressure is stable.  She has an element of whitecoat hypertension.  She will monitor blood pressures closely at home. 3. COPD on home oxygen: Stable and managed by primary care physician. 4. She will be seen in follow-up appointment in 3 months or earlier if she has any concerns.  Patient had multiple questions which were answered to her satisfaction.   Medication Adjustments/Labs and Tests Ordered: Current medicines are reviewed at length with the patient today.  Concerns regarding medicines are outlined above.  No orders of the defined types were placed in this encounter.  No orders of the defined types were placed in this encounter.    No chief complaint on file.    History of Present Illness:    Tina Jimenez is a 79 y.o. female.  Patient has past medical history of paroxysmal  atrial fibrillation, essential hypertension history of pulmonary embolism and Covid pneumonia.  She has.  COPD and is on oxygen at the time.  She is brought in by wheelchair by family member.  At the time of my evaluation, the patient is alert awake oriented and in no distress.  Past Medical History:  Diagnosis Date  . Atrial fibrillation ([place])   . GERD (gastroesophageal reflux disease)   . Hyperlipemia     Past Surgical History:  Procedure Laterality Date  . APPENDECTOMY    . KYPHOPLASTY      Current Medications: Current Meds  Medication Sig  . carvedilol (COREG) 6.25 MG tablet Take 1 tablet (6.25 mg total) by mouth 2 (two) times daily.  . cefdinir (OMNICEF) 300 MG capsule Take 1 capsule (300 mg total) by mouth 2 (two) times daily.  Marland Kitchen Co-Enzyme Q-10 100 MG CAPS Take 100 mg by mouth daily.  . digoxin (LANOXIN) 0.125 MG tablet TAKE 1 TABLET BY MOUTH DAILY  . ezetimibe (ZETIA) 10 MG tablet Take 10 mg by mouth daily.   . famotidine (PEPCID) 40 MG tablet Take 40 mg by mouth at bedtime.  . ferrous sulfate 325 (65 FE) MG tablet Take 1 tablet (325 mg total) by mouth daily with breakfast.  . fluticasone (FLONASE) 50 MCG/ACT nasal spray Place 2 sprays into both nostrils daily.  . fluticasone furoate-vilanterol (BREO ELLIPTA) 100-25 MCG/INH AEPB Inhale 1 puff into the lungs daily.  . furosemide (LASIX) 20 MG tablet TAKE 1/2 - 1 TABLET BY MOUTH DAILY AS NEEDED FOR EDEMA  .  ipratropium-albuterol (DUONEB) 0.5-2.5 (3) MG/3ML SOLN Take 3 mLs by nebulization in the morning, at noon, in the evening, and at bedtime.  Marland Kitchen loratadine (CLARITIN) 10 MG tablet TAKE 1 TABLET BY MOUTH ONCE DAILY FOR ALLERGIES  . omeprazole (PRILOSEC) 20 MG capsule Take 1 capsule (20 mg total) by mouth daily.  . polyethylene glycol (MIRALAX / GLYCOLAX) 17 g packet Take 17 g by mouth daily.     Allergies:   Multaq [dronedarone], Klonopin [clonazepam], Lipitor [atorvastatin], and Simvastatin   Social History   Socioeconomic  History  . Marital status: Widowed    Spouse name: Not on file  . Number of children: 4  . Years of education: Not on file  . Highest education level: Not on file  Occupational History  . Occupation: retired  Tobacco Use  . Smoking status: Former Smoker    Quit date: 11/01/2018    Years since quitting: 1.2  . Smokeless tobacco: Never Used  Vaping Use  . Vaping Use: Never used  Substance and Sexual Activity  . Alcohol use: Never  . Drug use: Never  . Sexual activity: Not on file  Other Topics Concern  . Not on file  Social History Narrative  . Not on file   Social Determinants of Health   Financial Resource Strain:   . Difficulty of Paying Living Expenses:   Food Insecurity:   . Worried About Charity fundraiser in the Last Year:   . Arboriculturist in the Last Year:   Transportation Needs:   . Film/video editor (Medical):   Marland Kitchen Lack of Transportation (Non-Medical):   Physical Activity:   . Days of Exercise per Week:   . Minutes of Exercise per Session:   Stress:   . Feeling of Stress :   Social Connections:   . Frequency of Communication with Friends and Family:   . Frequency of Social Gatherings with Friends and Family:   . Attends Religious Services:   . Active Member of Clubs or Organizations:   . Attends Archivist Meetings:   Marland Kitchen Marital Status:      Family History: The patient's family history includes Breast cancer in her sister; Lung cancer in her daughter and sister.  ROS:   Please see the history of present illness.    All other systems reviewed and are negative.  EKGs/Labs/Other Studies Reviewed:    The following studies were reviewed today: I discussed my findings with the patient in extensive length   Recent Labs: 01/04/2020: ALT 12; BUN 9; Creatinine, Ser 0.50; Hemoglobin 11.8; Platelets 268; Potassium 4.7; Sodium 142; TSH 2.150  Recent Lipid Panel    Component Value Date/Time   CHOL 204 (H) 01/04/2020 1020   TRIG 114  01/04/2020 1020   HDL 55 01/04/2020 1020   CHOLHDL 3.7 01/04/2020 1020   LDLCALC 129 (H) 01/04/2020 1020    Physical Exam:    VS:  BP (!) 180/84   Pulse 78   Ht 5\' 3"  (1.6 m)   Wt 141 lb 9.6 oz (64.2 kg)   SpO2 99%   BMI 25.08 kg/m     Wt Readings from Last 3 Encounters:  01/17/20 141 lb 9.6 oz (64.2 kg)  01/04/20 139 lb (63 kg)  11/01/19 137 lb (62.1 kg)     GEN: Patient is in no acute distress HEENT: Normal NECK: No JVD; No carotid bruits LYMPHATICS: No lymphadenopathy CARDIAC: Hear sounds regular, 2/6 systolic murmur at the apex. RESPIRATORY:  Clear to auscultation without rales, wheezing or rhonchi  ABDOMEN: Soft, non-tender, non-distended MUSCULOSKELETAL:  No edema; No deformity  SKIN: Warm and dry NEUROLOGIC:  Alert and oriented x 3 PSYCHIATRIC:  Normal affect   Signed, Jenean Lindau, MD  01/17/2020 4:33 PM    [facility]

## 2020-01-26 ENCOUNTER — Other Ambulatory Visit: Payer: Self-pay | Admitting: Physician Assistant

## 2020-02-03 ENCOUNTER — Other Ambulatory Visit: Payer: Self-pay | Admitting: Family Medicine

## 2020-02-09 ENCOUNTER — Other Ambulatory Visit: Payer: Self-pay | Admitting: Physician Assistant

## 2020-02-09 DIAGNOSIS — J449 Chronic obstructive pulmonary disease, unspecified: Secondary | ICD-10-CM | POA: Diagnosis not present

## 2020-02-16 ENCOUNTER — Other Ambulatory Visit: Payer: Self-pay | Admitting: Physician Assistant

## 2020-03-01 DIAGNOSIS — Z01818 Encounter for other preprocedural examination: Secondary | ICD-10-CM | POA: Diagnosis not present

## 2020-03-01 DIAGNOSIS — H25812 Combined forms of age-related cataract, left eye: Secondary | ICD-10-CM | POA: Diagnosis not present

## 2020-03-04 ENCOUNTER — Other Ambulatory Visit: Payer: Self-pay | Admitting: Physician Assistant

## 2020-03-11 DIAGNOSIS — J449 Chronic obstructive pulmonary disease, unspecified: Secondary | ICD-10-CM | POA: Diagnosis not present

## 2020-03-12 ENCOUNTER — Other Ambulatory Visit: Payer: Self-pay | Admitting: Physician Assistant

## 2020-03-12 MED ORDER — IPRATROPIUM-ALBUTEROL 0.5-2.5 (3) MG/3ML IN SOLN: 3.0000 mL | Freq: Four times a day (QID) | RESPIRATORY_TRACT | 1 refills | Status: DC

## 2020-03-26 DIAGNOSIS — H259 Unspecified age-related cataract: Secondary | ICD-10-CM | POA: Diagnosis not present

## 2020-03-26 DIAGNOSIS — H25812 Combined forms of age-related cataract, left eye: Secondary | ICD-10-CM | POA: Diagnosis not present

## 2020-03-26 DIAGNOSIS — Z7901 Long term (current) use of anticoagulants: Secondary | ICD-10-CM | POA: Diagnosis not present

## 2020-03-26 DIAGNOSIS — D649 Anemia, unspecified: Secondary | ICD-10-CM | POA: Diagnosis not present

## 2020-03-26 DIAGNOSIS — Z9981 Dependence on supplemental oxygen: Secondary | ICD-10-CM | POA: Diagnosis not present

## 2020-03-26 DIAGNOSIS — I4891 Unspecified atrial fibrillation: Secondary | ICD-10-CM | POA: Diagnosis not present

## 2020-03-26 DIAGNOSIS — Z79899 Other long term (current) drug therapy: Secondary | ICD-10-CM | POA: Diagnosis not present

## 2020-03-26 DIAGNOSIS — Z87891 Personal history of nicotine dependence: Secondary | ICD-10-CM | POA: Diagnosis not present

## 2020-03-26 DIAGNOSIS — K5909 Other constipation: Secondary | ICD-10-CM | POA: Diagnosis not present

## 2020-03-26 DIAGNOSIS — J449 Chronic obstructive pulmonary disease, unspecified: Secondary | ICD-10-CM | POA: Diagnosis not present

## 2020-03-26 DIAGNOSIS — I10 Essential (primary) hypertension: Secondary | ICD-10-CM | POA: Diagnosis not present

## 2020-04-01 ENCOUNTER — Other Ambulatory Visit: Payer: Self-pay

## 2020-04-01 MED ORDER — IPRATROPIUM-ALBUTEROL 0.5-2.5 (3) MG/3ML IN SOLN: 3.0000 mL | Freq: Four times a day (QID) | RESPIRATORY_TRACT | 1 refills | Status: DC

## 2020-04-08 ENCOUNTER — Ambulatory Visit (INDEPENDENT_AMBULATORY_CARE_PROVIDER_SITE_OTHER): Payer: Medicare Other | Admitting: Nurse Practitioner

## 2020-04-08 ENCOUNTER — Ambulatory Visit: Payer: Medicare Other | Admitting: Nurse Practitioner

## 2020-04-08 ENCOUNTER — Other Ambulatory Visit: Payer: Self-pay

## 2020-04-08 ENCOUNTER — Ambulatory Visit: Payer: Medicare Other | Admitting: Physician Assistant

## 2020-04-08 VITALS — BP 142/80 | HR 72 | Temp 97.2°F | Ht 63.0 in | Wt 144.6 lb

## 2020-04-08 DIAGNOSIS — I480 Paroxysmal atrial fibrillation: Secondary | ICD-10-CM

## 2020-04-08 DIAGNOSIS — J449 Chronic obstructive pulmonary disease, unspecified: Secondary | ICD-10-CM

## 2020-04-08 DIAGNOSIS — E782 Mixed hyperlipidemia: Secondary | ICD-10-CM

## 2020-04-08 DIAGNOSIS — Z9981 Dependence on supplemental oxygen: Secondary | ICD-10-CM | POA: Diagnosis not present

## 2020-04-08 DIAGNOSIS — Z23 Encounter for immunization: Secondary | ICD-10-CM | POA: Diagnosis not present

## 2020-04-08 NOTE — Patient Instructions (Signed)
Follow-up in 3 months We will call you in 3 months  COVID-19 Frequently Asked Questions COVID-19 (coronavirus disease) is an infection that is caused by a large family of viruses. Some viruses cause illness in people and others cause illness in animals like camels, cats, and bats. In some cases, the viruses that cause illness in animals can spread to humans. Where did the coronavirus come from? In December 2019, [place] told the [facility]) of several cases of lung disease (human respiratory illness). These cases were linked to an open seafood and livestock market in the city of [place]. The link to the seafood and livestock market suggests that the virus may have spread from animals to humans. However, since that first outbreak in December, the virus has also been shown to spread from person to person. What is the name of the disease and the virus? Disease name Early on, this disease was called novel coronavirus. This is because scientists determined that the disease was caused by a new (novel) respiratory virus. The World Health Organization [facility]) has now named the disease COVID-19, or coronavirus disease. Virus name The virus that causes the disease is called severe acute respiratory syndrome coronavirus 2 (SARS-CoV-2). More information on disease and virus naming World Health Organization [facility]): www.who.int/emergencies/diseases/novel-coronavirus-2019/technical-guidance/naming-the-coronavirus-disease-(covid-2019)-and-the-virus-that-causes-it Who is at risk for complications from coronavirus disease? Some people may be at higher risk for complications from coronavirus disease. This includes older adults and people who have chronic diseases, such as heart disease, diabetes, and lung disease. If you are at higher risk for complications, take these extra precautions:  Stay home as much as possible.  Avoid social gatherings and travel.  Avoid close contact with others. Stay at  least 6 ft (2 m) away from others, if possible.  Wash your hands often with soap and water for at least 20 seconds.  Avoid touching your face, mouth, nose, or eyes.  Keep supplies on hand at home, such as food, medicine, and cleaning supplies.  If you must go out in public, wear a cloth face covering or face mask. Make sure your mask covers your nose and mouth. How does coronavirus disease spread? The virus that causes coronavirus disease spreads easily from person to person (is contagious). You may catch the virus by:  Breathing in droplets from an infected person. Droplets can be spread by a person breathing, speaking, singing, coughing, or sneezing.  Touching something, like a table or a doorknob, that was exposed to the virus (contaminated) and then touching your mouth, nose, or eyes. Can I get the virus from touching surfaces or objects? There is still a lot that we do not know about the virus that causes coronavirus disease. Scientists are basing a lot of information on what they know about similar viruses, such as:  Viruses cannot generally survive on surfaces for long. They need a human body (host) to survive.  It is more likely that the virus is spread by close contact with people who are sick (direct contact), such as through: ? Shaking hands or hugging. ? Breathing in respiratory droplets that travel through the air. Droplets can be spread by a person breathing, speaking, singing, coughing, or sneezing.  It is less likely that the virus is spread when a person touches a surface or object that has the virus on it (indirect contact). The virus may be able to enter the body if the person touches a surface or object and then touches his or her face, eyes, nose, or mouth.  Can a person spread the virus without having symptoms of the disease? It may be possible for the virus to spread before a person has symptoms of the disease, but this is most likely not the main way the virus is  spreading. It is more likely for the virus to spread by being in close contact with people who are sick and breathing in the respiratory droplets spread by a person breathing, speaking, singing, coughing, or sneezing. What are the symptoms of coronavirus disease? Symptoms vary from person to person and can range from mild to severe. Symptoms may include:  Fever or chills.  Cough.  Difficulty breathing or feeling short of breath.  Headaches, body aches, or muscle aches.  Runny or stuffy (congested) nose.  Sore throat.  New loss of taste or smell.  Nausea, vomiting, or diarrhea. These symptoms can appear anywhere from 2 to 14 days after you have been exposed to the virus. Some people may not have any symptoms. If you develop symptoms, call your health care provider. People with severe symptoms may need hospital care. Should I be tested for this virus? Your health care provider will decide whether to test you based on your symptoms, history of exposure, and your risk factors. How does a health care provider test for this virus? Health care providers will collect samples to send for testing. Samples may include:  Taking a swab of fluid from the back of your nose and throat, your nose, or your throat.  Taking fluid from the lungs by having you cough up mucus (sputum) into a sterile cup.  Taking a blood sample. Is there a treatment or vaccine for this virus? Currently, there is no vaccine to prevent coronavirus disease. Also, there are no medicines like antibiotics or antivirals to treat the virus. A person who becomes sick is given supportive care, which means rest and fluids. A person may also relieve his or her symptoms by using over-the-counter medicines that treat sneezing, coughing, and runny nose. These are the same medicines that a person takes for the common cold. If you develop symptoms, call your health care provider. People with severe symptoms may need hospital care. What can  I do to protect myself and my family from this virus?     You can protect yourself and your family by taking the same actions that you would take to prevent the spread of other viruses. Take the following actions:  Wash your hands often with soap and water for at least 20 seconds. If soap and water are not available, use alcohol-based hand sanitizer.  Avoid touching your face, mouth, nose, or eyes.  Cough or sneeze into a tissue, sleeve, or elbow. Do not cough or sneeze into your hand or the air. ? If you cough or sneeze into a tissue, throw it away immediately and wash your hands.  Disinfect objects and surfaces that you frequently touch every day.  Stay away from people who are sick.  Avoid going out in public, follow guidance from your state and local health authorities.  Avoid crowded indoor spaces. Stay at least 6 ft (2 m) away from others.  If you must go out in public, wear a cloth face covering or face mask. Make sure your mask covers your nose and mouth.  Stay home if you are sick, except to get medical care. Call your health care provider before you get medical care. Your health care provider will tell you how long to stay home.  Make sure  your vaccines are up to date. Ask your health care provider what vaccines you need. What should I do if I need to travel? Follow travel recommendations from your local health authority, the CDC, and WHO. Travel information and advice  Centers for Disease Control and Prevention (CDC): BodyEditor.hu  World Health Organization [facility]): ThirdIncome.ca Know the risks and take action to protect your health  You are at higher risk of getting coronavirus disease if you are traveling to areas with an outbreak or if you are exposed to travelers from areas with an outbreak.  Wash your hands often and practice good hygiene to lower the risk of catching  or spreading the virus. What should I do if I am sick? General instructions to stop the spread of infection  Wash your hands often with soap and water for at least 20 seconds. If soap and water are not available, use alcohol-based hand sanitizer.  Cough or sneeze into a tissue, sleeve, or elbow. Do not cough or sneeze into your hand or the air.  If you cough or sneeze into a tissue, throw it away immediately and wash your hands.  Stay home unless you must get medical care. Call your health care provider or local health authority before you get medical care.  Avoid public areas. Do not take public transportation, if possible.  If you can, wear a mask if you must go out of the house or if you are in close contact with someone who is not sick. Make sure your mask covers your nose and mouth. Keep your home clean  Disinfect objects and surfaces that are frequently touched every day. This may include: ? Counters and tables. ? Doorknobs and light switches. ? Sinks and faucets. ? Electronics such as phones, remote controls, keyboards, computers, and tablets.  Wash dishes in hot, soapy water or use a dishwasher. Air-dry your dishes.  Wash laundry in hot water. Prevent infecting other household members  Let healthy household members care for children and pets, if possible. If you have to care for children or pets, wash your hands often and wear a mask.  Sleep in a different bedroom or bed, if possible.  Do not share personal items, such as razors, toothbrushes, deodorant, combs, brushes, towels, and washcloths. Where to find more information Centers for Disease Control and Prevention (CDC)  Information and news updates: https://www.butler-gonzalez.com/ World Health Organization [facility])  Information and news updates: MissExecutive.com.ee  Coronavirus health topic: https://www.castaneda.info/  Questions and answers on COVID-19:  OpportunityDebt.at  Global tracker: who.sprinklr.com American Academy of Pediatrics (AAP)  Information for families: www.healthychildren.org/English/health-issues/conditions/chest-lungs/Pages/2019-Novel-Coronavirus.aspx The coronavirus situation is changing rapidly. Check your local health authority website or the CDC and [facility] websites for updates and news. When should I contact a health care provider?  Contact your health care provider if you have symptoms of an infection, such as fever or cough, and you: ? Have been near anyone who is known to have coronavirus disease. ? Have come into contact with a person who is suspected to have coronavirus disease. ? Have traveled to an area where there is an outbreak of COVID-19. When should I get emergency medical care?  Get help right away by calling your local emergency services (911 in the [place] if you have: ? Trouble breathing. ? Pain or pressure in your chest. ? Confusion. ? Blue-tinged lips and fingernails. ? Difficulty waking from sleep. ? Symptoms that get worse. Let the emergency medical personnel know if you think you have coronavirus disease. Summary  A new respiratory virus is spreading from person to person and causing COVID-19 (coronavirus disease).  The virus that causes COVID-19 appears to spread easily. It spreads from one person to another through droplets from breathing, speaking, singing, coughing, or sneezing.  Older adults and those with chronic diseases are at higher risk of disease. If you are at higher risk for complications, take extra precautions.  There is currently no vaccine to prevent coronavirus disease. There are no medicines, such as antibiotics or antivirals, to treat the virus.  You can protect yourself and your family by washing your hands often, avoiding touching your face, and covering your coughs and sneezes. This information is not intended to replace advice given to you  by your health care provider. Make sure you discuss any questions you have with your health care provider. Document Revised: 03/31/2019 Document Reviewed: 09/27/2018 Elsevier Patient Education  [place].  Preventing Influenza, Adult Influenza, more commonly known as "the flu," is a viral infection that mainly affects the respiratory tract. The respiratory tract includes structures that help you breathe, such as the lungs, nose, and throat. The flu causes many common cold symptoms, as well as a high fever and body aches. The flu spreads easily from person to person (is contagious). The flu is most common from December through March. This is called flu season.You can catch the flu virus by:  Breathing in droplets from an infected person's cough or sneeze.  Touching something that was recently contaminated with the virus and then touching your mouth, nose, or eyes. What can I do to lower my risk?        You can decrease your risk of getting the flu by:  Getting a flu shot (influenza vaccination) every year. This is the best way to prevent the flu. A flu shot is recommended for everyone age 67 months and older. ? It is best to get a flu shot in the fall, as soon as it is available. Getting a flu shot during winter or spring instead is still a good idea. Flu season can last into early spring. ? Preventing the flu through vaccination requires getting a new flu shot every year. This is because the flu virus changes slightly (mutates) from one year to the next. Even if a flu shot does not completely protect you from all flu virus mutations, it can reduce the severity of your illness and prevent dangerous complications of the flu. ? If you are pregnant, you can and should get a flu shot. ? If you have had a reaction to the shot in the past or if you are allergic to eggs, check with your health care provider before getting a flu shot. ? Sometimes the vaccine is available as a nasal spray. In  some years, the nasal spray has not been as effective against the flu virus. Check with your health care provider if you have questions about this.  Practicing good health habits. This is especially important during flu season. ? Avoid contact with people who are sick with flu or cold symptoms. ? Wash your hands with soap and water often. If soap and water are not available, use alcohol-based hand sanitizer. ? Avoid touching your hands to your face, especially when you have not washed your hands recently. ? Use a disinfectant to clean surfaces at home and at work that may be contaminated with the flu virus. ? Keep your body's disease-fighting system (immune system) in good shape by eating a healthy diet,  drinking plenty of fluids, getting enough sleep, and exercising regularly. If you do get the flu, avoid spreading it to others by:  Staying home until your symptoms have been gone for at least one day.  Covering your mouth and nose when you cough or sneeze.  Avoiding close contact with others, especially babies and elderly people. Why are these changes important? Getting a flu shot and practicing good health habits protects you as well as other people. If you get the flu, your friends, family, and co-workers are also at risk of getting it, because it spreads so easily to others. Each year, about 2 out of every 10 people get the flu. Having the flu can lead to complications, such as pneumonia, ear infection, and sinus infection. The flu also can be deadly, especially for babies, people older than age 51, and people who have serious long-term diseases. How is this treated? Most people recover from the flu by resting at home and drinking plenty of fluids. However, a prescription antiviral medicine may reduce your flu symptoms and may make your flu go away sooner. This medicine must be started within a few days of getting flu symptoms. You can talk with your health care provider about whether you need  an antiviral medicine. Antiviral medicine may be prescribed for people who are at risk for more serious flu symptoms. This includes people who:  Are older than age 62.  Are pregnant.  Have a condition that makes the flu worse or more dangerous. Where to find more information  Centers for Disease Control and Prevention: http://www.smith-bell.org/  LittleRockMedicine.com.ee: azureicus.com  American Academy of Family Physicians: familydoctor.org/familydoctor/en/kids/vaccines/preventing-the-flu.html Contact a health care provider if:  You have influenza and you develop new symptoms.  You have: ? Chest pain. ? Diarrhea. ? A fever.  Your cough gets worse, or you produce more mucus. Summary  The best way to prevent the flu is to get a flu shot every year in the fall.  Even if you get the flu after you have received the yearly vaccine, your flu may be milder and go away sooner because of your flu shot.  If you get the flu, antiviral medicines that are started with a few days of symptoms may reduce your flu symptoms and may make your flu go away sooner.  You can also help prevent the flu by practicing good health habits. This information is not intended to replace advice given to you by your health care provider. Make sure you discuss any questions you have with your health care provider. Document Revised: 05/14/2017 Document Reviewed: 02/08/2016 Elsevier Patient Education  2020 [place].  Chronic Obstructive Pulmonary Disease Chronic obstructive pulmonary disease (COPD) is a long-term (chronic) lung problem. When you have COPD, it is hard for air to get in and out of your lungs. Usually the condition gets worse over time, and your lungs will never return to normal. There are things you can do to keep yourself as healthy as possible.  Your doctor may treat your condition with: ? Medicines. ? Oxygen. ? Lung surgery.  Your doctor may also recommend: ? Rehabilitation. This  includes steps to make your body work better. It may involve a team of specialists. ? Quitting smoking, if you smoke. ? Exercise and changes to your diet. ? Comfort measures (palliative care). Follow these instructions at home: Medicines  Take over-the-counter and prescription medicines only as told by your doctor.  Talk to your doctor before taking any cough or allergy medicines. You  may need to avoid medicines that cause your lungs to be dry. Lifestyle  If you smoke, stop. Smoking makes the problem worse. If you need help quitting, ask your doctor.  Avoid being around things that make your breathing worse. This may include smoke, chemicals, and fumes.  Stay active, but remember to rest as well.  Learn and use tips on how to relax.  Make sure you get enough sleep. Most adults need at least 7 hours of sleep every night.  Eat healthy foods. Eat smaller meals more often. Rest before meals. Controlled breathing Learn and use tips on how to control your breathing as told by your doctor. Try:  Breathing in (inhaling) through your nose for 1 second. Then, pucker your lips and breath out (exhale) through your lips for 2 seconds.  Putting one hand on your belly (abdomen). Breathe in slowly through your nose for 1 second. Your hand on your belly should move out. Pucker your lips and breathe out slowly through your lips. Your hand on your belly should move in as you breathe out.  Controlled coughing Learn and use controlled coughing to clear mucus from your lungs. Follow these steps: 1. Lean your head a little forward. 2. Breathe in deeply. 3. Try to hold your breath for 3 seconds. 4. Keep your mouth slightly open while coughing 2 times. 5. Spit any mucus out into a tissue. 6. Rest and do the steps again 1 or 2 times as needed. General instructions  Make sure you get all the shots (vaccines) that your doctor recommends. Ask your doctor about a flu shot and a pneumonia shot.  Use  oxygen therapy and pulmonary rehabilitation if told by your doctor. If you need home oxygen therapy, ask your doctor if you should buy a tool to measure your oxygen level (oximeter).  Make a COPD action plan with your doctor. This helps you to know what to do if you feel worse than usual.  Manage any other conditions you have as told by your doctor.  Avoid going outside when it is very hot, cold, or humid.  Avoid people who have a sickness you can catch (contagious).  Keep all follow-up visits as told by your doctor. This is important. Contact a doctor if:  You cough up more mucus than usual.  There is a change in the color or thickness of the mucus.  It is harder to breathe than usual.  Your breathing is faster than usual.  You have trouble sleeping.  You need to use your medicines more often than usual.  You have trouble doing your normal activities such as getting dressed or walking around the house. Get help right away if:  You have shortness of breath while resting.  You have shortness of breath that stops you from: ? Being able to talk. ? Doing normal activities.  Your chest hurts for longer than 5 minutes.  Your skin color is more blue than usual.  Your pulse oximeter shows that you have low oxygen for longer than 5 minutes.  You have a fever.  You feel too tired to breathe normally. Summary  Chronic obstructive pulmonary disease (COPD) is a long-term lung problem.  The way your lungs work will never return to normal. Usually the condition gets worse over time. There are things you can do to keep yourself as healthy as possible.  Take over-the-counter and prescription medicines only as told by your doctor.  If you smoke, stop. Smoking makes the problem worse.  This information is not intended to replace advice given to you by your health care provider. Make sure you discuss any questions you have with your health care provider. Document Revised: 05/14/2017  Document Reviewed: 07/06/2016 Elsevier Patient Education  [place] Oxygen Use, Adult When a medical condition keeps you from getting enough oxygen, your health care provider may instruct you to take extra oxygen at home. Your health care provider will let you know:  When to take oxygen.  For how long to take oxygen.  How quickly oxygen should be delivered (flow rate), in liters per minute (LPM or L/M). Home oxygen can be given through:  A mask.  A nasal cannula. This is a device or tube that goes in the nostrils.  A transtracheal catheter. This is a small, flexible tube placed in the trachea.  A tracheostomy. This is a surgically made opening in the trachea. These devices are connected with tubing to an oxygen source, such as:  A tank. Tanks hold oxygen in gas form. They must be replaced when the oxygen is used up.  A liquid oxygen device. This holds oxygen in liquid form. It must be replaced when the oxygen is used up.  An oxygen concentrator machine. This filters oxygen in the room. It uses electricity, so you must have a backup cylinder of oxygen in case the power goes out. Supplies needed: To use oxygen, you will need:  A mask, nasal cannula, transtracheal catheter, or tracheostomy.  An oxygen tank, a liquid oxygen device, or an oxygen concentrator.  The tape that your health care provider recommends (optional). If you use a transtracheal catheter and your prescribed flow rate is 1 LPM or greater, you will also need a humidifier. Risks and complications  Fire. This can happen if the oxygen is exposed to a heat source, flame, or spark.  Injury to skin. This can happen if liquid oxygen touches your skin.  Organ damage. This can happen if you get too little oxygen. How to use oxygen Your health care provider or a representative from your [place] will show you how to use your oxygen device. Follow her or his instructions. The instructions may  look something like this: 1. Wash your hands. 2. If you use an oxygen concentrator, make sure it is plugged in. 3. Place one end of the tube into the port on the tank, device, or machine. 4. Place the mask over your nose and mouth. Or, place the nasal cannula and secure it with tape if instructed. If you use a tracheostomy or transtracheal catheter, connect it to the oxygen source as directed. 5. Make sure the liter-flow setting on the machine is at the level prescribed by your health care provider. 6. Turn on the machine or adjust the knob on the tank or device to the correct liter-flow setting. 7. When you are done, turn off and unplug the machine, or turn the knob to OFF. How to clean and care for the oxygen supplies Nasal cannula  Clean it with a warm, wet cloth daily or as needed.  Wash it with a liquid soap once a week.  Rinse it thoroughly once or twice a week.  Replace it every 2-4 weeks.  If you have an infection, such as a cold or pneumonia, change the cannula when you get better. Mask  Replace it every 2-4 weeks.  If you have an infection, such as a cold or pneumonia, change the mask when you get better. Humidifier  bottle  Wash the bottle between each refill: ? Wash it with soap and warm water. ? Rinse it thoroughly. ? Disinfect it and its top. ? Air-dry it.  Make sure it is dry before you refill it. Oxygen concentrator  Clean the air filter at least twice a week according to directions from your home medical equipment and service company.  Wipe down the cabinet every day. To do this: ? Unplug the unit. ? Wipe down the cabinet with a damp cloth. ? Dry the cabinet. Other equipment  Change any extra tubing every 1-3 months.  Follow instructions from your health care provider about taking care of any other equipment. Safety tips Fire safety tips   Keep your oxygen and oxygen supplies at least 5 ft away from sources of heat, flames, and sparks at all  times.  Do not allow smoking near your oxygen. Put up "no smoking" signs in your home. Avoid smoking areas when in public.  Do not use materials that can burn (are flammable) while you use oxygen.  When you go to a restaurant with portable oxygen, ask to be seated in the nonsmoking section.  Keep a Data processing manager close by. Let your fire department know that you have oxygen in your home.  Test your home smoke detectors regularly. Traveling  Secure your oxygen tank in the vehicle so that it does not move around. Follow instructions from your medical device company about how to safely secure your tank.  Make sure you have enough oxygen for the amount of time you will be away from home.  If you are planning air travel, contact the airline to find out if they allow the use of an approved portable oxygen concentrator. You may also need documents from your health care provider and medical device company before you travel. General safety tips  If you use an oxygen cylinder, make sure it is in a stand or secured to an object that will not move (fixed object).  If you use liquid oxygen, make sure its container is kept upright.  If you use an oxygen concentrator: ? Dance movement psychotherapist company. Make sure you are given priority service in the event that your power goes out. ? Avoid using extension cords, if possible. Follow these instructions at home:  Use oxygen only as told by your health care provider.  Do not use alcohol or other drugs that make you relax (sedating drugs) unless instructed. They can slow down your breathing rate and make it hard to get in enough oxygen.  Know how and when to order a refill of oxygen.  Always keep a spare tank of oxygen. Plan ahead for holidays when you may not be able to get a prescription filled.  Use water-based lubricants on your lips or nostrils. Do not use oil-based products like petroleum jelly.  To prevent skin irritation on your cheeks or  behind your ears, tuck some gauze under the tubing. Contact a health care provider if:  You get headaches often.  You have shortness of breath.  You have a lasting cough.  You have anxiety.  You are sleepy all the time.  You develop an illness that affects your breathing.  You cannot exercise at your regular level.  You are restless.  You have difficult or irregular breathing, and it is getting worse.  You have a fever.  You have persistent redness under your nose. Get help right away if:  You are confused.  You have blue lips or  fingernails.  You are struggling to breathe. Summary  Your health care provider or a representative from your [place] will show you how to use your oxygen device. Follow her or his instructions.  If you use an oxygen concentrator, make sure it is plugged in.  Make sure the liter-flow setting on the machine is at the level prescribed by your health care provider.  Keep your oxygen and oxygen supplies at least 5 ft away from sources of heat, flames, and sparks at all times. This information is not intended to replace advice given to you by your health care provider. Make sure you discuss any questions you have with your health care provider. Document Revised: 11/18/2017 Document Reviewed: 12/24/2015 Elsevier Patient Education  [place] Prevention in the Home, Adult Falls can cause injuries. They can happen to people of all ages. There are many things you can do to make your home safe and to help prevent falls. Ask for help when making these changes, if needed. What actions can I take to prevent falls? General Instructions  Use good lighting in all rooms. Replace any light bulbs that burn out.  Turn on the lights when you go into a dark area. Use night-lights.  Keep items that you use often in easy-to-reach places. Lower the shelves around your home if necessary.  Set up your furniture so you have a clear  path. Avoid moving your furniture around.  Do not have throw rugs and other things on the floor that can make you trip.  Avoid walking on wet floors.  If any of your floors are uneven, fix them.  Add color or contrast paint or tape to clearly mark and help you see: ? Any grab bars or handrails. ? First and last steps of stairways. ? Where the edge of each step is.  If you use a stepladder: ? Make sure that it is fully opened. Do not climb a closed stepladder. ? Make sure that both sides of the stepladder are locked into place. ? Ask someone to hold the stepladder for you while you use it.  If there are any pets around you, be aware of where they are. What can I do in the bathroom?      Keep the floor dry. Clean up any water that spills onto the floor as soon as it happens.  Remove soap buildup in the tub or shower regularly.  Use non-skid mats or decals on the floor of the tub or shower.  Attach bath mats securely with double-sided, non-slip rug tape.  If you need to sit down in the shower, use a plastic, non-slip stool.  Install grab bars by the toilet and in the tub and shower. Do not use towel bars as grab bars. What can I do in the bedroom?  Make sure that you have a light by your bed that is easy to reach.  Do not use any sheets or blankets that are too big for your bed. They should not hang down onto the floor.  Have a firm chair that has side arms. You can use this for support while you get dressed. What can I do in the kitchen?  Clean up any spills right away.  If you need to reach something above you, use a strong step stool that has a grab bar.  Keep electrical cords out of the way.  Do not use floor polish or wax that makes floors slippery. If you must use wax,  use non-skid floor wax. What can I do with my stairs?  Do not leave any items on the stairs.  Make sure that you have a light switch at the top of the stairs and the bottom of the stairs. If  you do not have them, ask someone to add them for you.  Make sure that there are handrails on both sides of the stairs, and use them. Fix handrails that are broken or loose. Make sure that handrails are as long as the stairways.  Install non-slip stair treads on all stairs in your home.  Avoid having throw rugs at the top or bottom of the stairs. If you do have throw rugs, attach them to the floor with carpet tape.  Choose a carpet that does not hide the edge of the steps on the stairway.  Check any carpeting to make sure that it is firmly attached to the stairs. Fix any carpet that is loose or worn. What can I do on the outside of my home?  Use bright outdoor lighting.  Regularly fix the edges of walkways and driveways and fix any cracks.  Remove anything that might make you trip as you walk through a door, such as a raised step or threshold.  Trim any bushes or trees on the path to your home.  Regularly check to see if handrails are loose or broken. Make sure that both sides of any steps have handrails.  Install guardrails along the edges of any raised decks and porches.  Clear walking paths of anything that might make someone trip, such as tools or rocks.  Have any leaves, snow, or ice cleared regularly.  Use sand or salt on walking paths during winter.  Clean up any spills in your garage right away. This includes grease or oil spills. What other actions can I take?  Wear shoes that: ? Have a low heel. Do not wear high heels. ? Have rubber bottoms. ? Are comfortable and fit you well. ? Are closed at the toe. Do not wear open-toe sandals.  Use tools that help you move around (mobility aids) if they are needed. These include: ? Canes. ? Walkers. ? Scooters. ? Crutches.  Review your medicines with your doctor. Some medicines can make you feel dizzy. This can increase your chance of falling. Ask your doctor what other things you can do to help prevent falls. Where to  find more information  Centers for Disease Control and Prevention, STEADI: https://garcia.biz/  [employer] on Aging: BrainJudge.co.uk Contact a doctor if:  You are afraid of falling at home.  You feel weak, drowsy, or dizzy at home.  You fall at home. Summary  There are many simple things that you can do to make your home safe and to help prevent falls.  Ways to make your home safe include removing tripping hazards and installing grab bars in the bathroom.  Ask for help when making these changes in your home. This information is not intended to replace advice given to you by your health care provider. Make sure you discuss any questions you have with your health care provider. Document Revised: 09/22/2018 Document Reviewed: 01/14/2017 Elsevier Patient Education  2020 [place] 65 Years and Older, Female Preventive care refers to lifestyle choices and visits with your health care provider that can promote health and wellness. This includes:  A yearly physical exam. This is also called an annual well check.  Regular dental and eye exams.  Immunizations.  Screening  for certain conditions.  Healthy lifestyle choices, such as diet and exercise. What can I expect for my preventive care visit? Physical exam Your health care provider will check:  Height and weight. These may be used to calculate body mass index (BMI), which is a measurement that tells if you are at a healthy weight.  Heart rate and blood pressure.  Your skin for abnormal spots. Counseling Your health care provider may ask you questions about:  Alcohol, tobacco, and drug use.  Emotional well-being.  Home and relationship well-being.  Sexual activity.  Eating habits.  History of falls.  Memory and ability to understand (cognition).  Work and work Statistician.  Pregnancy and menstrual history. What immunizations do I need?  Influenza (flu) vaccine  This is  recommended every year. Tetanus, diphtheria, and pertussis (Tdap) vaccine  You may need a Td booster every 10 years. Varicella (chickenpox) vaccine  You may need this vaccine if you have not already been vaccinated. Zoster (shingles) vaccine  You may need this after age 24. Pneumococcal conjugate (PCV13) vaccine  One dose is recommended after age 71. Pneumococcal polysaccharide (PPSV23) vaccine  One dose is recommended after age 62. Measles, mumps, and rubella (MMR) vaccine  You may need at least one dose of MMR if you were born in 1957 or later. You may also need a second dose. Meningococcal conjugate (MenACWY) vaccine  You may need this if you have certain conditions. Hepatitis A vaccine  You may need this if you have certain conditions or if you travel or work in places where you may be exposed to hepatitis A. Hepatitis B vaccine  You may need this if you have certain conditions or if you travel or work in places where you may be exposed to hepatitis B. Haemophilus influenzae type b (Hib) vaccine  You may need this if you have certain conditions. You may receive vaccines as individual doses or as more than one vaccine together in one shot (combination vaccines). Talk with your health care provider about the risks and benefits of combination vaccines. What tests do I need? Blood tests  Lipid and cholesterol levels. These may be checked every 5 years, or more frequently depending on your overall health.  Hepatitis C test.  Hepatitis B test. Screening  Lung cancer screening. You may have this screening every year starting at age 64 if you have a 30-pack-year history of smoking and currently smoke or have quit within the past 15 years.  Colorectal cancer screening. All adults should have this screening starting at age 63 and continuing until age 50. Your health care provider may recommend screening at age 2 if you are at increased risk. You will have tests every 1-10  years, depending on your results and the type of screening test.  Diabetes screening. This is done by checking your blood sugar (glucose) after you have not eaten for a while (fasting). You may have this done every 1-3 years.  Mammogram. This may be done every 1-2 years. Talk with your health care provider about how often you should have regular mammograms.  BRCA-related cancer screening. This may be done if you have a family history of breast, ovarian, tubal, or peritoneal cancers. Other tests  Sexually transmitted disease (STD) testing.  Bone density scan. This is done to screen for osteoporosis. You may have this done starting at age 33. Follow these instructions at home: Eating and drinking  Eat a diet that includes fresh fruits and vegetables, whole grains, lean protein,  and low-fat dairy products. Limit your intake of foods with high amounts of sugar, saturated fats, and salt.  Take vitamin and mineral supplements as recommended by your health care provider.  Do not drink alcohol if your health care provider tells you not to drink.  If you drink alcohol: ? Limit how much you have to 0-1 drink a day. ? Be aware of how much alcohol is in your drink. In the [place] one drink equals one 12 oz bottle of beer (355 mL), one 5 oz glass of wine (148 mL), or one 1 oz glass of hard liquor (44 mL). Lifestyle  Take daily care of your teeth and gums.  Stay active. Exercise for at least 30 minutes on 5 or more days each week.  Do not use any products that contain nicotine or tobacco, such as cigarettes, e-cigarettes, and chewing tobacco. If you need help quitting, ask your health care provider.  If you are sexually active, practice safe sex. Use a condom or other form of protection in order to prevent STIs (sexually transmitted infections).  Talk with your health care provider about taking a low-dose aspirin or statin. What's next?  Go to your health care provider once a year for a well  check visit.  Ask your health care provider how often you should have your eyes and teeth checked.  Stay up to date on all vaccines. This information is not intended to replace advice given to you by your health care provider. Make sure you discuss any questions you have with your health care provider. Document Revised: 05/26/2018 Document Reviewed: 05/26/2018 Elsevier Patient Education  2020 [employer].

## 2020-04-08 NOTE — Progress Notes (Signed)
Subjective:  Patient ID: Tina Jimenez, female    DOB: 09/24/1940  Age: 79 y.o. MRN: [id number]  Chief Complaint  Patient presents with  . Hypertension    F/U    HPI  Tina Jimenez is a 79 year old Caucasian female here for f/u of hypertension, a-fib, and COPD.   HYPERTENSION Moderately controlled with Coreg 6.25 mg BID. BP 142/80 in office today. She denies headache, dizziness, chest pain, or dyspnea.  She is compliant with her medication and follow-up appointments.   ATRIAL-FIBRILLATION Diagnosed a-fib in Dec 2020 current treatment Digoxin 0.125mg  daily. She is followed by cardiologist Dr Geraldo Pitter. She is not currently taking anticoagulants due to unsteady gait and previous back surgery.  Dr. Geraldo Pitter states in his note that she will consider resuming anticoagulation therapy in the future.  She denies lightheadedness, syncope, or chest pain she has a history of COPD with O2 dependence and denies current shortness of breath  COPD with O2 Dependence The patient has a history of COPD for over 5 years she quit smoking cigarettes of 2020.  She is O2 dependent on 3 L of oxygen continuously today she has been brought into the clinic in a wheelchair assisted by her adult son she denies current shortness of breath or chest pain vital signs were stable.  Oxygen saturation is 100%.  Her COPD is well controlled with Breo Ellipta and DuoNeb inhalers.  She has received a flu shot today and is scheduled to return to the clinic on Thursday for a Covid vaccine booster.       Current Outpatient Medications on File Prior to Visit  Medication Sig Dispense Refill  . carvedilol (COREG) 6.25 MG tablet TAKE 1 TABLET BY MOUTH TWICE A DAY 180 tablet 0  . cefdinir (OMNICEF) 300 MG capsule Take 1 capsule (300 mg total) by mouth 2 (two) times daily. 20 capsule 0  . Co-Enzyme Q-10 100 MG CAPS Take 100 mg by mouth daily.    . digoxin (LANOXIN) 0.125 MG tablet TAKE 1 TABLET BY MOUTH DAILY 90 tablet 2  .  ezetimibe (ZETIA) 10 MG tablet Take 10 mg by mouth daily.     . famotidine (PEPCID) 40 MG tablet Take 40 mg by mouth at bedtime.    . ferrous sulfate 325 (65 FE) MG tablet Take 1 tablet (325 mg total) by mouth daily with breakfast. 90 tablet 1  . fluticasone (FLONASE) 50 MCG/ACT nasal spray SPRAY 2 SPRAYS INTO EACH NOSTRIL EVERY DAY 48 mL 2  . fluticasone furoate-vilanterol (BREO ELLIPTA) 100-25 MCG/INH AEPB Inhale 1 puff into the lungs daily. 60 each 5  . ipratropium-albuterol (DUONEB) 0.5-2.5 (3) MG/3ML SOLN Take 3 mLs by nebulization in the morning, at noon, in the evening, and at bedtime. 360 mL 1  . loratadine (CLARITIN) 10 MG tablet TAKE 1 TABLET BY MOUTH ONCE DAILY FOR ALLERGIES 90 tablet 1  . omeprazole (PRILOSEC) 20 MG capsule TAKE 1 CAPSULE BY MOUTH EVERY DAY 90 capsule 0  . polyethylene glycol (MIRALAX / GLYCOLAX) 17 g packet Take 17 g by mouth daily.     No current facility-administered medications on file prior to visit.   Past Medical History:  Diagnosis Date  . Atrial fibrillation ([place])   . GERD (gastroesophageal reflux disease)   . Hyperlipemia    Past Surgical History:  Procedure Laterality Date  . APPENDECTOMY    . KYPHOPLASTY      Family History  Problem Relation Age of Onset  . Breast cancer Sister   .  Lung cancer Sister   . Lung cancer Daughter    Social History   Socioeconomic History  . Marital status: Widowed    Spouse name: Not on file  . Number of children: 4  . Years of education: Not on file  . Highest education level: Not on file  Occupational History  . Occupation: retired  Tobacco Use  . Smoking status: Former Smoker    Quit date: 11/01/2018    Years since quitting: 1.4  . Smokeless tobacco: Never Used  Vaping Use  . Vaping Use: Never used  Substance and Sexual Activity  . Alcohol use: Never  . Drug use: Never  . Sexual activity: Not on file  Other Topics Concern  . Not on file  Social History Narrative  . Not on file   Social  Determinants of Health   Financial Resource Strain:   . Difficulty of Paying Living Expenses: Not on file  Food Insecurity:   . Worried About Charity fundraiser in the Last Year: Not on file  . Ran Out of Food in the Last Year: Not on file  Transportation Needs:   . Lack of Transportation (Medical): Not on file  . Lack of Transportation (Non-Medical): Not on file  Physical Activity:   . Days of Exercise per Week: Not on file  . Minutes of Exercise per Session: Not on file  Stress:   . Feeling of Stress : Not on file  Social Connections:   . Frequency of Communication with Friends and Family: Not on file  . Frequency of Social Gatherings with Friends and Family: Not on file  . Attends Religious Services: Not on file  . Active Member of Clubs or Organizations: Not on file  . Attends Archivist Meetings: Not on file  . Marital Status: Not on file    Review of Systems  Constitutional: Negative for fatigue.  HENT: Negative for congestion, ear pain and sore throat.   Respiratory: Negative for cough and shortness of breath.   Cardiovascular: Negative for chest pain.  Gastrointestinal: Negative for abdominal pain, constipation, diarrhea, nausea and vomiting.  Genitourinary: Negative for dysuria, frequency and urgency.  Musculoskeletal: Positive for back pain. Negative for arthralgias and myalgias.  Skin: Negative for color change.  Neurological: Negative for dizziness, weakness and headaches.  Psychiatric/Behavioral: Negative for agitation and sleep disturbance. The patient is not nervous/anxious.      Objective:  BP (!) 142/80 (BP Location: Right Arm, Patient Position: Sitting, Cuff Size: Normal)   Pulse 72   Temp (!) 97.2 F (36.2 C) (Temporal)   Ht 5\' 3"  (1.6 m)   Wt 144 lb 9.6 oz (65.6 kg)   SpO2 100% Comment: 3L  BMI 25.61 kg/m   BP/Weight 04/08/2020 01/17/2020 0/02/2329  Systolic BP 076 226 333  Diastolic BP 80 84 78  Wt. (Lbs) 144.6 141.6 139  BMI 25.61  25.08 21.13    Physical Exam Vitals reviewed.  Constitutional:      Appearance: Normal appearance.  HENT:     Head: Normocephalic.     Right Ear: Tympanic membrane normal.     Left Ear: Tympanic membrane normal.     Nose: Nose normal.     Mouth/Throat:     Mouth: Mucous membranes are moist.  Eyes:     Extraocular Movements: Extraocular movements intact.     Pupils: Pupils are equal, round, and reactive to light.     Comments: Glasses present  Cardiovascular:  Rate and Rhythm: Normal rate. Rhythm irregular.     Pulses: Normal pulses.     Heart sounds: Normal heart sounds.  Pulmonary:     Effort: Pulmonary effort is normal.     Comments: Diminished breath sounds bilaterally Abdominal:     General: Bowel sounds are normal.     Palpations: Abdomen is soft.  Genitourinary:    Comments: Deferred Musculoskeletal:        General: Tenderness (back) present.     Cervical back: Neck supple.     Comments: Decreased ROM to back due to chronic pain and advanced age; in w/c   Skin:    General: Skin is warm and dry.     Capillary Refill: Capillary refill takes less than 2 seconds.  Neurological:     General: No focal deficit present.     Mental Status: She is alert and oriented to person, place, and time.  Psychiatric:        Mood and Affect: Mood normal.        Behavior: Behavior normal.              Lab Results  Component Value Date   WBC 10.2 01/04/2020   HGB 11.8 01/04/2020   HCT 37.2 01/04/2020   PLT 268 01/04/2020   GLUCOSE 93 01/04/2020   CHOL 204 (H) 01/04/2020   TRIG 114 01/04/2020   HDL 55 01/04/2020   LDLCALC 129 (H) 01/04/2020   ALT 12 01/04/2020   AST 15 01/04/2020   NA 142 01/04/2020   K 4.7 01/04/2020   CL 99 01/04/2020   CREATININE 0.50 (L) 01/04/2020   BUN 9 01/04/2020   CO2 30 (H) 01/04/2020   TSH 2.150 01/04/2020      Assessment & Plan:   1. O2 dependent -Continue O2 3L/min via [place]  2. COPD not affecting current episode of care  ([place]) - CBC with Differential - Comprehensive metabolic panel -Continue Breo Ellipta inhaler  3. Mixed hyperlipidemia - CBC with Differential - Lipid Panel - Comprehensive metabolic panel  4. Need for influenza vaccination - Flu Vaccine QUAD High Dose(Fluad)          Follow-up: F/U 3 months         Return Thursday, October 27th, 2021 for COVID-19 vaccine  An After Visit Summary was printed and given to the patient.  [place] [phone]

## 2020-04-09 LAB — COMPREHENSIVE METABOLIC PANEL
ALT: 10 IU/L (ref 0–32)
AST: 16 IU/L (ref 0–40)
Albumin/Globulin Ratio: 1.6 (ref 1.2–2.2)
Albumin: 4.2 g/dL (ref 3.7–4.7)
Alkaline Phosphatase: 107 IU/L (ref 44–121)
BUN/Creatinine Ratio: 18 (ref 12–28)
BUN: 10 mg/dL (ref 8–27)
Bilirubin Total: 0.3 mg/dL (ref 0.0–1.2)
CO2: 29 mmol/L (ref 20–29)
Calcium: 9.2 mg/dL (ref 8.7–10.3)
Chloride: 103 mmol/L (ref 96–106)
Creatinine, Ser: 0.57 mg/dL (ref 0.57–1.00)
GFR calc Af Amer: 103 mL/min/{1.73_m2} (ref >59)
GFR calc non Af Amer: 90 mL/min/{1.73_m2} (ref >59)
Globulin, Total: 2.7 g/dL (ref 1.5–4.5)
Glucose: 95 mg/dL (ref 65–99)
Potassium: 4.6 mmol/L (ref 3.5–5.2)
Sodium: 144 mmol/L (ref 134–144)
Total Protein: 6.9 g/dL (ref 6.0–8.5)

## 2020-04-09 LAB — CBC WITH DIFFERENTIAL/PLATELET
Basophils Absolute: 0.1 10*3/uL (ref 0.0–0.2)
Basos: 1 %
EOS (ABSOLUTE): 0.3 10*3/uL (ref 0.0–0.4)
Eos: 3 %
Hematocrit: 40.8 % (ref 34.0–46.6)
Hemoglobin: 13.2 g/dL (ref 11.1–15.9)
Immature Grans (Abs): 0 10*3/uL (ref 0.0–0.1)
Immature Granulocytes: 0 %
Lymphocytes Absolute: 1.3 10*3/uL (ref 0.7–3.1)
Lymphs: 13 %
MCH: 29.7 pg (ref 26.6–33.0)
MCHC: 32.4 g/dL (ref 31.5–35.7)
MCV: 92 fL (ref 79–97)
Monocytes Absolute: 0.6 10*3/uL (ref 0.1–0.9)
Monocytes: 6 %
Neutrophils Absolute: 7.9 10*3/uL — ABNORMAL HIGH (ref 1.4–7.0)
Neutrophils: 77 %
Platelets: 171 10*3/uL (ref 150–450)
RBC: 4.45 x10E6/uL (ref 3.77–5.28)
RDW: 12.9 % (ref 11.7–15.4)
WBC: 10.3 10*3/uL (ref 3.4–10.8)

## 2020-04-09 LAB — LIPID PANEL
Chol/HDL Ratio: 3.5 ratio (ref 0.0–4.4)
Cholesterol, Total: 202 mg/dL — ABNORMAL HIGH (ref 100–199)
HDL: 58 mg/dL (ref >39)
LDL Chol Calc (NIH): 125 mg/dL — ABNORMAL HIGH (ref 0–99)
Triglycerides: 104 mg/dL (ref 0–149)
VLDL Cholesterol Cal: 19 mg/dL (ref 5–40)

## 2020-04-09 LAB — CARDIOVASCULAR RISK ASSESSMENT

## 2020-04-09 NOTE — Progress Notes (Signed)
GQH:QIXMDE; CMP: Normal; Lipid panel improved slightly TC elevated at 202, HDL 58, Trig 104, and elevated LDL 125. Continue to consume heart healthy diet. Doing great overall.

## 2020-04-10 DIAGNOSIS — J449 Chronic obstructive pulmonary disease, unspecified: Secondary | ICD-10-CM | POA: Diagnosis not present

## 2020-04-11 ENCOUNTER — Other Ambulatory Visit: Payer: Self-pay

## 2020-04-11 ENCOUNTER — Ambulatory Visit (INDEPENDENT_AMBULATORY_CARE_PROVIDER_SITE_OTHER): Payer: Medicare Other

## 2020-04-11 DIAGNOSIS — Z23 Encounter for immunization: Secondary | ICD-10-CM

## 2020-04-11 NOTE — Progress Notes (Signed)
   Covid-19 Vaccination Clinic  Name:  Tina Jimenez    MRN: [id number] DOB: Oct 22, 1940  04/11/2020  [name] was observed post Covid-19 immunization for 15 minutes without incident. She was provided with Vaccine Information Sheet and instruction to access the V-Safe system.   [name] was instructed to call 911 with any severe reactions post vaccine: Marland Kitchen Difficulty breathing  . Swelling of face and throat  . A fast heartbeat  . A bad rash all over body  . Dizziness and weakness   Immunizations Administered    Name Date Dose VIS Date Route   Pfizer COVID-19 Vaccine 04/11/2020  3:45 PM 0.3 mL 08/09/2018 Intramuscular   Manufacturer: [place]   Lot: [id number]   NDC: [address]-7

## 2020-04-14 ENCOUNTER — Encounter: Payer: Self-pay | Admitting: Nurse Practitioner

## 2020-04-14 DIAGNOSIS — J449 Chronic obstructive pulmonary disease, unspecified: Secondary | ICD-10-CM | POA: Diagnosis not present

## 2020-04-14 DIAGNOSIS — Z9981 Dependence on supplemental oxygen: Secondary | ICD-10-CM | POA: Insufficient documentation

## 2020-04-14 HISTORY — DX: Dependence on supplemental oxygen: Z99.81

## 2020-04-16 ENCOUNTER — Telehealth: Payer: Self-pay | Admitting: Physician Assistant

## 2020-04-16 NOTE — Chronic Care Management (AMB) (Signed)
°  Chronic Care Management   Note  04/16/2020 Name: TYIESHA BRACKNEY MRN: [id number] DOB: 16-Jan-1941  LORISSA KISHBAUGH is a 79 y.o. year old female who is a primary care patient of Marge Duncans, Hershal Coria. I reached out to [place] by phone today in response to a referral sent by Ms. Winifred Olive Moilanen's PCP, Marge Duncans, PA-C.   Ms. Hurless was given information about Chronic Care Management services today including:  1. CCM service includes personalized support from designated clinical staff supervised by her physician, including individualized plan of care and coordination with other care providers 2. 24/7 contact phone numbers for assistance for urgent and routine care needs. 3. Service will only be billed when office clinical staff spend 20 minutes or more in a month to coordinate care. 4. Only one practitioner may furnish and bill the service in a calendar month. 5. The patient may stop CCM services at any time (effective at the end of the month) by phone call to the office staff.   Patient agreed to services and verbal consent obtained.   Follow up plan:   [place]

## 2020-04-17 ENCOUNTER — Other Ambulatory Visit: Payer: Self-pay | Admitting: Physician Assistant

## 2020-04-23 DIAGNOSIS — I10 Essential (primary) hypertension: Secondary | ICD-10-CM | POA: Diagnosis not present

## 2020-04-23 DIAGNOSIS — Z87891 Personal history of nicotine dependence: Secondary | ICD-10-CM | POA: Diagnosis not present

## 2020-04-23 DIAGNOSIS — H52213 Irregular astigmatism, bilateral: Secondary | ICD-10-CM | POA: Diagnosis not present

## 2020-04-23 DIAGNOSIS — J449 Chronic obstructive pulmonary disease, unspecified: Secondary | ICD-10-CM | POA: Diagnosis not present

## 2020-04-23 DIAGNOSIS — Z79899 Other long term (current) drug therapy: Secondary | ICD-10-CM | POA: Diagnosis not present

## 2020-04-23 DIAGNOSIS — H259 Unspecified age-related cataract: Secondary | ICD-10-CM | POA: Diagnosis not present

## 2020-04-23 DIAGNOSIS — H25811 Combined forms of age-related cataract, right eye: Secondary | ICD-10-CM | POA: Diagnosis not present

## 2020-04-23 DIAGNOSIS — H353121 Nonexudative age-related macular degeneration, left eye, early dry stage: Secondary | ICD-10-CM | POA: Diagnosis not present

## 2020-04-23 DIAGNOSIS — E785 Hyperlipidemia, unspecified: Secondary | ICD-10-CM | POA: Diagnosis not present

## 2020-04-23 DIAGNOSIS — Z7982 Long term (current) use of aspirin: Secondary | ICD-10-CM | POA: Diagnosis not present

## 2020-05-02 ENCOUNTER — Ambulatory Visit (INDEPENDENT_AMBULATORY_CARE_PROVIDER_SITE_OTHER): Payer: Medicare Other

## 2020-05-02 ENCOUNTER — Other Ambulatory Visit: Payer: Self-pay

## 2020-05-02 DIAGNOSIS — Z23 Encounter for immunization: Secondary | ICD-10-CM | POA: Diagnosis not present

## 2020-05-02 NOTE — Progress Notes (Signed)
   Covid-19 Vaccination Clinic  Name:  CATALINA SALASAR    MRN: [id number] DOB: February 16, 1941  05/02/2020  Ms. Hegarty was observed post Covid-19 immunization for 15 minutes without incident. She was provided with Vaccine Information Sheet and instruction to access the V-Safe system.   Ms. Galli was instructed to call 911 with any severe reactions post vaccine: Marland Kitchen Difficulty breathing  . Swelling of face and throat  . A fast heartbeat  . A bad rash all over body  . Dizziness and weakness   Immunizations Administered    Name Date Dose VIS Date Route   Pfizer COVID-19 Vaccine 05/02/2020  3:15 PM 0.3 mL 04/03/2020 Intramuscular   Manufacturer: [place]   Lot: [id number]   NDC: [address]-6

## 2020-05-11 DIAGNOSIS — J449 Chronic obstructive pulmonary disease, unspecified: Secondary | ICD-10-CM | POA: Diagnosis not present

## 2020-05-12 ENCOUNTER — Other Ambulatory Visit: Payer: Self-pay | Admitting: Physician Assistant

## 2020-05-12 ENCOUNTER — Other Ambulatory Visit: Payer: Self-pay | Admitting: Family Medicine

## 2020-05-14 DIAGNOSIS — J449 Chronic obstructive pulmonary disease, unspecified: Secondary | ICD-10-CM | POA: Diagnosis not present

## 2020-05-15 NOTE — Progress Notes (Deleted)
Chronic Care Management Pharmacy  Name: Tina Jimenez  MRN: [id number] DOB: Oct 20, 1940  Chief Complaint/ HPI  [place],  79 y.o. , female presents for their Initial CCM visit with the clinical pharmacist {CHL HP Upstream Pharm visit VBTY:[id number]}.  PCP : Marge Duncans, PA-C  Their chronic conditions include: hypertension, afib, history of DVT, COPD, hyperlipidemia, vitamin D deficiency, anemia, fatigue.   Office Visits:*** 05/02/2020 - COVID vaccine.  04/08/2020 - flu shot administered. Continue current therapy.  01/04/2020 - continue Eliquis. Lasix continued for foot swelling. Mild cough and congestion noted.  Consult Visit:*** 01/17/2020 - Cardio - patient's anticoag was discontinued due to back surgery and unsteady gait.  Medications: Outpatient Encounter Medications as of 05/21/2020  Medication Sig  . carvedilol (COREG) 6.25 MG tablet TAKE 1 TABLET BY MOUTH TWICE A DAY  . cefdinir (OMNICEF) 300 MG capsule Take 1 capsule (300 mg total) by mouth 2 (two) times daily.  Marland Kitchen Co-Enzyme Q-10 100 MG CAPS Take 100 mg by mouth daily.  . digoxin (LANOXIN) 0.125 MG tablet TAKE 1 TABLET BY MOUTH DAILY  . ezetimibe (ZETIA) 10 MG tablet TAKE 1 TABLET BY MOUTH EVERY DAY  . famotidine (PEPCID) 40 MG tablet Take 40 mg by mouth at bedtime.  . ferrous sulfate 325 (65 FE) MG tablet TAKE 1 TABLET BY MOUTH EVERY DAY WITH BREAKFAST  . fluticasone (FLONASE) 50 MCG/ACT nasal spray SPRAY 2 SPRAYS INTO EACH NOSTRIL EVERY DAY  . fluticasone furoate-vilanterol (BREO ELLIPTA) 100-25 MCG/INH AEPB Inhale 1 puff into the lungs daily.  Marland Kitchen ipratropium-albuterol (DUONEB) 0.5-2.5 (3) MG/3ML SOLN Take 3 mLs by nebulization in the morning, at noon, in the evening, and at bedtime.  Marland Kitchen loratadine (CLARITIN) 10 MG tablet TAKE 1 TABLET BY MOUTH ONCE DAILY FOR ALLERGIES  . omeprazole (PRILOSEC) 20 MG capsule TAKE 1 CAPSULE BY MOUTH EVERY DAY  . polyethylene glycol (MIRALAX / GLYCOLAX) 17 g packet Take 17 g by  mouth daily.   No facility-administered encounter medications on file as of 05/21/2020.   Allergies  Allergen Reactions  . Multaq [Dronedarone] Diarrhea  . Klonopin [Clonazepam] Other (See Comments)    Pt can't remember reaction  . Lipitor [Atorvastatin] Other (See Comments)    Pt could barely lift arms  . Simvastatin Other (See Comments)    Pt can't remember reaction   SDOH Screenings   Alcohol Screen:   . Last Alcohol Screening Score (AUDIT): Not on file  Depression (PHQ2-9): Low Risk   . PHQ-2 Score: 0  Financial Resource Strain:   . Difficulty of Paying Living Expenses: Not on file  Food Insecurity:   . Worried About Charity fundraiser in the Last Year: Not on file  . Ran Out of Food in the Last Year: Not on file  Housing:   . Last Housing Risk Score: Not on file  Physical Activity:   . Days of Exercise per Week: Not on file  . Minutes of Exercise per Session: Not on file  Social Connections:   . Frequency of Communication with Friends and Family: Not on file  . Frequency of Social Gatherings with Friends and Family: Not on file  . Attends Religious Services: Not on file  . Active Member of Clubs or Organizations: Not on file  . Attends Archivist Meetings: Not on file  . Marital Status: Not on file  Stress:   . Feeling of Stress : Not on file  Tobacco Use: Medium Risk  . Smoking Tobacco  Use: Former Smoker  . Smokeless Tobacco Use: Never Used  Transportation Needs:   . Film/video editor (Medical): Not on file  . Lack of Transportation (Non-Medical): Not on file   Current Diagnosis/Assessment:  Goals Addressed   None     AFIB   Patient is currently {CHL HP BP RATE/[phone]} controlled. HR *** BPM  {This patient does not have a recorded CHADS2-VASc score.   Click here to calculate score.   Then [facility] your note.       :[id number]}  CHA2DS2-VASc Score = 4  This indicates a 4.8% annual risk of stroke. The patient's score is based  upon: CHF History: 0 HTN History: 1 Diabetes History: 0 Stroke History: 0 Vascular Disease History: 0 Age Score: 2 Gender Score: 1   {FYI    This patient has a significant risk of stroke if diagnosed with atrial fibrillation.  Please consider VKA or DOAC agent for anticoagulation if the bleeding risk is acceptable.  You can also use the SmartPhrase .[place] for documentation in the A&P of your note.   :[id number]}  Patient has failed these meds in past: Multaq, metoprolol, Eliquis Patient is currently {CHL Controlled/Uncontrolled:[phone]} on the following medications: ***  Digoxin 0.125 mg daily   We discussed:  {CHL HP Upstream Pharmacy discussion:[phone]}  Plan  Continue {CHL HP Upstream Pharmacy Plans:[phone]}  ,  COPD / Asthma / Tobacco   Last spirometry score: ***  Gold Grade: {CHL HP Upstream Pharm COPD Gold TMLYY:[id number]} Current COPD Classification:  {CHL HP Upstream Pharm COPD Classification:[id number]}  Eosinophil count:   Lab Results  Component Value Date/Time   EOSPCT 1 10/13/2019 12:07 PM  %                               Eos (Absolute):  Lab Results  Component Value Date/Time   EOSABS 0.3 04/08/2020 11:23 AM    Tobacco Status:  Social History   Tobacco Use  Smoking Status Former Smoker  . Quit date: 11/01/2018  . Years since quitting: 1.5  Smokeless Tobacco Never Used    Patient has failed these meds in past: *** Patient is currently {CHL Controlled/Uncontrolled:[phone]} on the following medications: ***  Flonase 50 mcg/actuation 2 sprays into each nostril every day  Breo Ellipta 100-25 mcg/inh 1 puff into lungs daily   Duoneb 3 mls morning, noon, evening and bedtime  Loratadine 10 mg daily for allergies Using maintenance inhaler regularly? {yes/no:20286} Frequency of rescue inhaler use:  {CHL HP Upstream Pharm Inhaler EXNT:7001749449}  We discussed:  {CHL HP Upstream Pharmacy discussion:[phone]}  Plan  Continue  {CHL HP Upstream Pharmacy Plans:[phone]}  and  Hypertension   BP today is:  {CHL HP UPSTREAM Pharmacist BP ranges:[phone]}  Office blood pressures are  BP Readings from Last 3 Encounters:  04/08/20 (!) 142/80  01/17/20 (!) 180/84  01/04/20 122/78    Patient has failed these meds in the past: amlodipine Patient is currently controlled/uncontrolled*** on the following medications: ***  Carvedilol 6.25 mg bid Patient checks BP at home {CHL HP BP Monitoring [phone]}  Patient home BP readings are ranging: ***  We discussed {CHL HP Upstream Pharmacy discussion:[phone]}  Plan  Continue {CHL HP Upstream Pharmacy Plans:[phone]}     Hyperlipidemia   LDL goal < ***  Last lipids Lab Results  Component Value Date   CHOL 202 (H) 04/08/2020   HDL 58 04/08/2020   LDLCALC 125 (H) 04/08/2020   TRIG 104  04/08/2020   CHOLHDL 3.5 04/08/2020   Hepatic Function Latest Ref Rng & Units 04/08/2020 01/04/2020 10/13/2019  Total Protein 6.0 - 8.5 g/dL 6.9 6.7 6.5  Albumin 3.7 - 4.7 g/dL 4.2 4.1 3.1(L)  AST 0 - 40 IU/L '16 15 15  ' ALT 0 - 32 IU/L '10 12 10  ' Alk Phosphatase 44 - 121 IU/L 107 96 76  Total Bilirubin 0.0 - 1.2 mg/dL 0.3 0.3 0.6     The 10-year ASCVD risk score Mikey Bussing DC Jr., et al., 2013) is: 30.8%   Values used to calculate the score:     Age: 87 years     Sex: Female     Is Non-Hispanic African American: No     Diabetic: No     Tobacco smoker: No     Systolic Blood Pressure: 373 mmHg     Is BP treated: Yes     HDL Cholesterol: 58 mg/dL     Total Cholesterol: 202 mg/dL   Patient has failed these meds in past: *** Patient is currently {CHL Controlled/Uncontrolled:[phone]} on the following medications:  . ***zetia 10 mg daily  . Coenzyme Q-10 100 mg daily   We discussed:  {CHL HP Upstream Pharmacy [phone]}  Plan  Continue {CHL HP Upstream Pharmacy Plans:[phone]}   Anemia   CBC Latest Ref Rng & Units 04/08/2020 01/04/2020  10/14/2019  WBC 3.4 - 10.8 x10E3/uL 10.3 10.2 7.4  Hemoglobin 11.1 - 15.9 g/dL 13.2 11.8 13.0  Hematocrit 34.0 - 46.6 % 40.8 37.2 42.9  Platelets 150 - 450 x10E3/uL 171 268 197   Iron/TIBC/Ferritin/ %Sat    Component Value Date/Time   IRON 10 (L) 08/24/2019 1514   TIBC 256 08/24/2019 1514   FERRITIN 115 08/24/2019 1514   IRONPCTSAT 4 (LL) 08/24/2019 1514    Patient has failed these meds in past: *** Patient is currently {CHL Controlled/Uncontrolled:[phone]} on the following medications:  . Ferrous sulfate 325 mg bid with a meal   We discussed:  ***  Plan  Continue {CHL HP Upstream Pharmacy Plans:[phone]}   Osteopenia / Osteoporosis   Last DEXA Scan: not listed in chart as completed***  T-Score femoral neck: ***  T-Score total hip: ***  T-Score lumbar spine: ***  T-Score forearm radius: ***  10-year probability of major osteoporotic fracture: ***  10-year probability of hip fracture: ***  No results found for: VD25OH   Patient {is;is not an osteoporosis candidate:23886}  Patient has failed these meds in past: *** Patient is currently {CHL Controlled/Uncontrolled:204 375 6313} on the following medications:  . ***  We discussed:  {Osteoporosis Counseling:23892}  Plan  Continue {CHL HP Upstream Pharmacy Plans:[phone]}   ***GERD   Patient has failed these meds in past: *** Patient is currently {CHL Controlled/Uncontrolled:204 375 6313} on the following medications:  . Famotidine 40 mg daily at bedtime . Omeprazole 20 mg daily  .   We discussed:  ***  Plan  Continue {CHL HP Upstream Pharmacy Plans:[phone]}    Health Maintenance   Patient is currently {CHL Controlled/Uncontrolled:204 375 6313} on the following medications:   Miralax 17 grams daily   We discussed:  ***  Plan  Continue {CHL HP Upstream Pharmacy GKKDP:[id number]}  Vaccines   Reviewed and discussed patient's vaccination history.    Immunization History  Administered Date(s)  Administered  . Fluad Quad(high Dose 65+) 04/08/2020  . Influenza, High Dose Seasonal PF 05/13/2016  . PFIZER SARS-COV-2 Vaccination 04/11/2020, 05/02/2020    Plan  Recommended patient receive *** vaccine in *** office.   Medication Management  Patient's [facility] pharmacy is:  [id number] [address] [address]Phone: [phone] Fax: [phone] Uses pill box? {Yes or If no, why not?:20788} Pt endorses ***% compliance  We discussed: {Pharmacy options:24294}  Plan  {US Pharmacy PIFOY:77412}   Follow up: *** month phone visit  ***

## 2020-05-20 ENCOUNTER — Telehealth: Payer: Self-pay

## 2020-05-20 NOTE — Chronic Care Management (AMB) (Signed)
Chronic Care Management Pharmacy Assistant   Name: Tina Jimenez  MRN: [id number] DOB: 1941-05-26  Reason for Encounter: Medication Review  - Initial Questions for Pharmacist Visit for 05/21/2020.  Patient Questions:  Have you seen any other providers since your last visit? Yes, 04/08/2020 [place] [facility]   Any changes in your medications or health? No  Any side effects from any medications? No  Do you have an symptoms or problems not managed by your medications?  Yes, Patient states she feels like her [name]  is not working like it did when she first started using.  Any concerns about your health right now? Just had cataract surgery (bilateral) few weeks ago.  Has your provider asked that you check blood pressure, blood sugar, or follow special diet at home? No , No pressure cuff. Patient does check her oxygen levels  Do you get any type of exercise on a regular basis? No, regular exercises, she does some, but she is on walker , states she has more back problems, her legs don't hurt much , but her back hurts when she walks a lot.  Can you think of a goal you would like to reach for your health? She would like to have less pain .   Do you have any problems getting your medications? No. [facility]  Is there anything that you would like to discuss during the appointment? No  Patient is aware to have medications and supplements available at time of  appointment    PCP : Marge Duncans, PA-C  Allergies:   Allergies  Allergen Reactions   Multaq [Dronedarone] Diarrhea   Klonopin [Clonazepam] Other (See Comments)    Pt can't remember reaction   Lipitor [Atorvastatin] Other (See Comments)    Pt could barely lift arms   Simvastatin Other (See Comments)    Pt can't remember reaction    Medications: Outpatient Encounter Medications as of 05/20/2020  Medication Sig   carvedilol (COREG) 6.25 MG tablet TAKE 1 TABLET BY MOUTH TWICE  A DAY   cefdinir (OMNICEF) 300 MG capsule Take 1 capsule (300 mg total) by mouth 2 (two) times daily.   Co-Enzyme Q-10 100 MG CAPS Take 100 mg by mouth daily.   digoxin (LANOXIN) 0.125 MG tablet TAKE 1 TABLET BY MOUTH DAILY   ezetimibe (ZETIA) 10 MG tablet TAKE 1 TABLET BY MOUTH EVERY DAY   famotidine (PEPCID) 40 MG tablet Take 40 mg by mouth at bedtime.   ferrous sulfate 325 (65 FE) MG tablet TAKE 1 TABLET BY MOUTH EVERY DAY WITH BREAKFAST   fluticasone (FLONASE) 50 MCG/ACT nasal spray SPRAY 2 SPRAYS INTO EACH NOSTRIL EVERY DAY   fluticasone furoate-vilanterol (BREO ELLIPTA) 100-25 MCG/INH AEPB Inhale 1 puff into the lungs daily.   ipratropium-albuterol (DUONEB) 0.5-2.5 (3) MG/3ML SOLN Take 3 mLs by nebulization in the morning, at noon, in the evening, and at bedtime.   loratadine (CLARITIN) 10 MG tablet TAKE 1 TABLET BY MOUTH ONCE DAILY FOR ALLERGIES   omeprazole (PRILOSEC) 20 MG capsule TAKE 1 CAPSULE BY MOUTH EVERY DAY   polyethylene glycol (MIRALAX / GLYCOLAX) 17 g packet Take 17 g by mouth daily.   No facility-administered encounter medications on file as of 05/20/2020.    Current Diagnosis: Patient Active Problem List   Diagnosis Date Noted   Dependence on supplemental oxygen 04/14/2020   Other fatigue 01/08/2020   Anemia 01/08/2020   Acute cystitis with hematuria 10/26/2019   Bladder  mass 10/26/2019   Acute pain of left shoulder 10/26/2019   Adnexal mass    Abdominal pain 10/13/2019   History of pulmonary embolism 10/02/2019   Abnormal blood chemistry 08/22/2019   Persistent atrial fibrillation ([place]) 08/22/2019   Localized edema 08/22/2019   Hyperlipemia 08/15/2019   Acute bronchitis with COPD ([place]) 08/15/2019   COPD (chronic obstructive pulmonary disease) ([place]) 08/15/2019   Hypertension 08/15/2019   Acute hypoxemic respiratory failure ([place]) 07/08/2019   Pneumonia due to COVID-19 virus 07/08/2019   Arm DVT (deep venous thromboembolism),  acute, left (HCC) 06/26/2019   Acute blood loss anemia 06/22/2019   Vitamin D deficiency 06/22/2019   Acute pulmonary embolism without acute cor pulmonale (HCC) 06/21/2019   Paroxysmal atrial fibrillation ([place]) 06/20/2019   Precordial pain 06/20/2019   Spinal stenosis of lumbar region 06/14/2019     Follow-Up:  Pharmacist Review   Judithann Sheen, [facility] Clinical Pharmacist Assistant [phone]   Judithann Sheen, [place] Pharmacist Assistant [phone]

## 2020-05-21 ENCOUNTER — Other Ambulatory Visit: Payer: Self-pay

## 2020-05-21 ENCOUNTER — Ambulatory Visit: Payer: Medicare Other

## 2020-05-21 DIAGNOSIS — I10 Essential (primary) hypertension: Secondary | ICD-10-CM

## 2020-05-21 DIAGNOSIS — E782 Mixed hyperlipidemia: Secondary | ICD-10-CM

## 2020-05-21 DIAGNOSIS — J449 Chronic obstructive pulmonary disease, unspecified: Secondary | ICD-10-CM

## 2020-05-21 NOTE — Chronic Care Management (AMB) (Deleted)
   Chronic Care Management Pharmacy  Name: Tina Jimenez  MRN: [id number] DOB: [birth year]  Chief Complaint/ HPI  Tina Jimenez,  79 y.o. , female presents for their Initial CCM visit with the clinical pharmacist via telephone due to COVID-19 Pandemic.  PCP : Tina Duncans, PA-C  Their chronic conditions include: hypertension, afib, COPD, acute bronchitis with COPD, hyperlipidemia, spinal stenolsis, vitamin D deficiency.   Office Visits:***  Consult Visit:***  Medications: Outpatient Encounter Medications as of 05/21/2020  Medication Sig  . carvedilol (COREG) 6.25 MG tablet TAKE 1 TABLET BY MOUTH TWICE A DAY  . cefdinir (OMNICEF) 300 MG capsule Take 1 capsule (300 mg total) by mouth 2 (two) times daily.  Marland Kitchen Co-Enzyme Q-10 100 MG CAPS Take 100 mg by mouth daily.  . digoxin (LANOXIN) 0.125 MG tablet TAKE 1 TABLET BY MOUTH DAILY  . ezetimibe (ZETIA) 10 MG tablet TAKE 1 TABLET BY MOUTH EVERY DAY  . famotidine (PEPCID) 40 MG tablet Take 40 mg by mouth at bedtime.  . ferrous sulfate 325 (65 FE) MG tablet TAKE 1 TABLET BY MOUTH EVERY DAY WITH BREAKFAST  . fluticasone (FLONASE) 50 MCG/ACT nasal spray SPRAY 2 SPRAYS INTO EACH NOSTRIL EVERY DAY  . fluticasone furoate-vilanterol (BREO ELLIPTA) 100-25 MCG/INH AEPB Inhale 1 puff into the lungs daily.  Marland Kitchen ipratropium-albuterol (DUONEB) 0.5-2.5 (3) MG/3ML SOLN Take 3 mLs by nebulization in the morning, at noon, in the evening, and at bedtime.  Marland Kitchen loratadine (CLARITIN) 10 MG tablet TAKE 1 TABLET BY MOUTH ONCE DAILY FOR ALLERGIES  . omeprazole (PRILOSEC) 20 MG capsule TAKE 1 CAPSULE BY MOUTH EVERY DAY  . polyethylene glycol (MIRALAX / GLYCOLAX) 17 g packet Take 17 g by mouth daily.   No facility-administered encounter medications on file as of 05/21/2020.     Current Diagnosis/Assessment:  Goals Addressed   None     {CHL HP Upstream Pharmacy Diagnosis/Assessment:[phone]}

## 2020-05-21 NOTE — Chronic Care Management (AMB) (Signed)
Chronic Care Management Pharmacy  Name: Tina Jimenez  MRN: [id number] DOB: [birth year]  Chief Complaint/ HPI  [place],  79 y.o. , female presents for their Initial CCM visit with the clinical pharmacist via telephone due to COVID-19 Pandemic.  PCP : Marge Duncans, PA-C  Plan Recommendations:   Patient is currently in the doughnut hole and Breo inhaler is really expensive. She would like to see about samples and patient assistance for [facility]. She is open to considering an alternative inhaler as well. She doesn't feel that [name] works as well as it used to. Please advise.   Their chronic conditions include: hypertension, afib, history of DVT, COPD, hyperlipidemia, vitamin D deficiency, anemia, fatigue.   Office Visits: 05/02/2020 - COVID vaccine.  04/08/2020 - flu shot administered. Continue current therapy.  01/04/2020 - continue Eliquis. Lasix continued for foot swelling. Mild cough and congestion noted.  Consult Visit: 01/17/2020 - Cardio - patient's anticoag was discontinued due to back surgery and unsteady gait.  Medications: Outpatient Encounter Medications as of 05/21/2020  Medication Sig  . carvedilol (COREG) 6.25 MG tablet TAKE 1 TABLET BY MOUTH TWICE A DAY  . cefdinir (OMNICEF) 300 MG capsule Take 1 capsule (300 mg total) by mouth 2 (two) times daily.  Marland Kitchen Co-Enzyme Q-10 100 MG CAPS Take 100 mg by mouth daily.  . digoxin (LANOXIN) 0.125 MG tablet TAKE 1 TABLET BY MOUTH DAILY  . ezetimibe (ZETIA) 10 MG tablet TAKE 1 TABLET BY MOUTH EVERY DAY  . famotidine (PEPCID) 40 MG tablet Take 40 mg by mouth at bedtime.  . ferrous sulfate 325 (65 FE) MG tablet TAKE 1 TABLET BY MOUTH EVERY DAY WITH BREAKFAST  . fluticasone (FLONASE) 50 MCG/ACT nasal spray SPRAY 2 SPRAYS INTO EACH NOSTRIL EVERY DAY  . fluticasone furoate-vilanterol (BREO ELLIPTA) 100-25 MCG/INH AEPB Inhale 1 puff into the lungs daily.  Marland Kitchen ipratropium-albuterol (DUONEB) 0.5-2.5 (3) MG/3ML SOLN Take 3 mLs by  nebulization in the morning, at noon, in the evening, and at bedtime.  Marland Kitchen loratadine (CLARITIN) 10 MG tablet TAKE 1 TABLET BY MOUTH ONCE DAILY FOR ALLERGIES  . omeprazole (PRILOSEC) 20 MG capsule TAKE 1 CAPSULE BY MOUTH EVERY DAY  . polyethylene glycol (MIRALAX / GLYCOLAX) 17 g packet Take 17 g by mouth daily.   No facility-administered encounter medications on file as of 05/21/2020.   Allergies  Allergen Reactions  . Multaq [Dronedarone] Diarrhea  . Klonopin [Clonazepam] Other (See Comments)    Pt can't remember reaction  . Lipitor [Atorvastatin] Other (See Comments)    Pt could barely lift arms  . Simvastatin Other (See Comments)    Pt can't remember reaction   SDOH Screenings   Alcohol Screen:   . Last Alcohol Screening Score (AUDIT): Not on file  Depression (PHQ2-9): Low Risk   . PHQ-2 Score: 0  Financial Resource Strain:   . Difficulty of Paying Living Expenses: Not on file  Food Insecurity:   . Worried About Charity fundraiser in the Last Year: Not on file  . Ran Out of Food in the Last Year: Not on file  Housing:   . Last Housing Risk Score: Not on file  Physical Activity:   . Days of Exercise per Week: Not on file  . Minutes of Exercise per Session: Not on file  Social Connections:   . Frequency of Communication with Friends and Family: Not on file  . Frequency of Social Gatherings with Friends and Family: Not on file  .  Attends Religious Services: Not on file  . Active Member of Clubs or Organizations: Not on file  . Attends Archivist Meetings: Not on file  . Marital Status: Not on file  Stress:   . Feeling of Stress : Not on file  Tobacco Use: Medium Risk  . Smoking Tobacco Use: Former Smoker  . Smokeless Tobacco Use: Never Used  Transportation Needs:   . Film/video editor (Medical): Not on file  . Lack of Transportation (Non-Medical): Not on file   Current Diagnosis/Assessment:  Goals Addressed   None     AFIB   Patient is currently  rhythm controlled. HR 72 BPM   CHA2DS2-VASc Score = 4  This indicates a 4.8% annual risk of stroke. The patient's score is based upon: CHF History: 0 HTN History: 1 Diabetes History: 0 Stroke History: 0 Vascular Disease History: 0 Age Score: 2 Gender Score: 1   Patient has failed these meds in past: Multaq, metoprolol, Eliquis Patient is currently controlled on the following medications:   Digoxin 0.125 mg daily   We discussed:  Patient reports afib began after COVID. Patient states stable/well controlled at this time. Patient was discontinued on Eliquis by cardiology in August due to unstead gait and back surgery.   Plan  Continue current medications  ,  COPD / Asthma / Tobacco   Last spirometry score: 05/05/2013 FEV1 - 42% FVC 71%  Gold Grade: Gold 3 (FEV1 30-49%)  Eosinophil count:   Lab Results  Component Value Date/Time   EOSPCT 1 10/13/2019 12:07 PM  %                               Eos (Absolute):  Lab Results  Component Value Date/Time   EOSABS 0.3 04/08/2020 11:23 AM    Tobacco Status:  Social History   Tobacco Use  Smoking Status Former Smoker  . Quit date: 11/01/2018  . Years since quitting: 1.5  Smokeless Tobacco Never Used    Patient has failed these meds in past: Combivent (made her jittery) Patient is currently uncontrolled on the following medications:   Flonase 50 mcg/actuation 2 sprays into each nostril every day  Breo Ellipta 100-25 mcg/inh 1 puff into lungs daily   Duoneb 3 mls morning, noon, evening and bedtime  Loratadine 10 mg daily for allergies  Using maintenance inhaler regularly? Yes Frequency of rescue inhaler use:  multiple times per day  We discussed:  proper inhaler technique. Reports sometimes feels that Breo isn't working as well. Using Duoneb four times daily.  Is interested in considering alternative inhaler if [name] approves.   Patient had COVID. She is on 3-3.5 L around the clock now. Patient reports it is very  hard for her to get out of the house with her oxygen due to her oxygen requirements not allowing small shoulder tank.   Patient is currently in the doughnut hole and inhaler is expensive. She would like to see about samples and patient assistance for [facility]. She is open to considering an alternative inhaler for improved control as well.   Plan  Consider alternative inhaler option.   and  Hypertension   BP today is:  Not currently checking at home.   Office blood pressures are  BP Readings from Last 3 Encounters:  04/08/20 (!) 142/80  01/17/20 (!) 180/84  01/04/20 122/78    Patient has failed these meds in the past: amlodipine Patient is currently controlled  on the following medications:   Carvedilol 6.25 mg bid Patient checks BP at home no longer has a blood pressure cuff at home. Reports years of white coat hypertension.   Patient home BP readings are ranging: unable to check  We discussed diet and exercise extensively.  Plan  Continue current medications     Hyperlipidemia   LDL goal < 100  Last lipids Lab Results  Component Value Date   CHOL 202 (H) 04/08/2020   HDL 58 04/08/2020   LDLCALC 125 (H) 04/08/2020   TRIG 104 04/08/2020   CHOLHDL 3.5 04/08/2020   Hepatic Function Latest Ref Rng & Units 04/08/2020 01/04/2020 10/13/2019  Total Protein 6.0 - 8.5 g/dL 6.9 6.7 6.5  Albumin 3.7 - 4.7 g/dL 4.2 4.1 3.1(L)  AST 0 - 40 IU/L '16 15 15  ' ALT 0 - 32 IU/L '10 12 10  ' Alk Phosphatase 44 - 121 IU/L 107 96 76  Total Bilirubin 0.0 - 1.2 mg/dL 0.3 0.3 0.6     The 10-year ASCVD risk score Mikey Bussing DC Jr., et al., 2013) is: 30.8%   Values used to calculate the score:     Age: 77 years     Sex: Female     Is Non-Hispanic African American: No     Diabetic: No     Tobacco smoker: No     Systolic Blood Pressure: 916 mmHg     Is BP treated: Yes     HDL Cholesterol: 58 mg/dL     Total Cholesterol: 202 mg/dL   Patient has failed these meds in past: lipitor, simvastatin   Patient is currently uncontrolled on the following medications:  . zetia 10 mg daily  . Coenzyme Q-10 100 mg daily   We discussed:  diet and exercise extensively. Patient reports eating a lot of fruit and vegetables in her diet. Vegetables that she eats: celery, cabbage, broccoli, cucumbers, tomatoes.  She also enjoys a hamburger occasionally. Doesn't have a big appetite. Could eats 6 small meals a day.   Patient unwilling to try weekly Crestor since had terrible issues with raising her arms on simvastatin and lipitor.   Plan  Continue current medications. Consider adding diagnosis code for statin intolerance.   Anemia   CBC Latest Ref Rng & Units 04/08/2020 01/04/2020 10/14/2019  WBC 3.4 - 10.8 x10E3/uL 10.3 10.2 7.4  Hemoglobin 11.1 - 15.9 g/dL 13.2 11.8 13.0  Hematocrit 34.0 - 46.6 % 40.8 37.2 42.9  Platelets 150 - 450 x10E3/uL 171 268 197   Iron/TIBC/Ferritin/ %Sat    Component Value Date/Time   IRON 10 (L) 08/24/2019 1514   TIBC 256 08/24/2019 1514   FERRITIN 115 08/24/2019 1514   IRONPCTSAT 4 (LL) 08/24/2019 1514    Patient has failed these meds in past: none reported  Patient is currently controlled on the following medications:  . Ferrous sulfate 325 mg daily with a meal  . Vitamin C 1000 mg daily with Iron for absorption  We discussed:  Patient reports good adherence to treatment.   Plan  Continue current medications   Osteopenia / Osteoporosis   Last DEXA Scan: has never had one  No results found for: VD25OH   Patient has failed these meds in past: none reported Patient is currently uncontrolled on the following medications:  . None reported  We discussed:  Recommend [phone] units of vitamin D daily. Recommend 1200 mg of calcium daily from dietary and supplemental sources.  Uses 2% milk on cereal or oatmeal. Eats cheese  often. Eats dark leafy green vegetables and broccoli.   Plan  Continue control with diet and exercise   GERD   Patient has  failed these meds in past: famotidine Patient is currently controlled on the following medications:  . Omeprazole 20 mg daily    We discussed:   Patient reports taking omeprazole for a while now. Reports rare breakthrough symptoms. Wears loose clothes and elevates her head at bedtime.   Plan  Continue current medications    Health Maintenance   Patient is currently controlled on the following medications:   Miralax 17 grams daily   We discussed:  Patient reports that she can'y exercise well which worsens her constipation. Reports that miralax helps well.   Sleep: reports sleeping well but does have to get up to go to the bathroom 2-3 times some nights. Falls asleep easily.   Plan  Continue current medications  Vaccines   Reviewed and discussed patient's vaccination history.  Has completed first 2 COVID shots. Both COVID shots made her feel bad and had fever next day. Uncertain if she will take her third shot. Patient has not taken a shingles shot.   Immunization History  Administered Date(s) Administered  . Fluad Quad(high Dose 65+) 04/08/2020  . Influenza, High Dose Seasonal PF 05/13/2016  . PFIZER SARS-COV-2 Vaccination 04/11/2020, 05/02/2020    Plan  Recommended patient receive COVID vaccine in office.   Medication Management   Patient's preferred pharmacy is:  [id number] [place] [place] [id number]: [phone] Fax: [phone] Uses pill box? Yes Pt endorses good compliance  We discussed: Discussed benefits of medication synchronization, packaging and delivery as well as enhanced pharmacist oversight with [employer]. Patient will discuss with her son.   Plan  Continue current medication management strategy    Follow up: 3 month phone visit

## 2020-05-22 DIAGNOSIS — R0902 Hypoxemia: Secondary | ICD-10-CM | POA: Insufficient documentation

## 2020-05-22 DIAGNOSIS — K219 Gastro-esophageal reflux disease without esophagitis: Secondary | ICD-10-CM | POA: Insufficient documentation

## 2020-05-22 DIAGNOSIS — M810 Age-related osteoporosis without current pathological fracture: Secondary | ICD-10-CM | POA: Insufficient documentation

## 2020-05-22 DIAGNOSIS — I4891 Unspecified atrial fibrillation: Secondary | ICD-10-CM | POA: Insufficient documentation

## 2020-05-23 ENCOUNTER — Ambulatory Visit: Payer: Medicare Other | Admitting: Cardiology

## 2020-05-23 ENCOUNTER — Encounter: Payer: Self-pay | Admitting: Cardiology

## 2020-05-23 ENCOUNTER — Other Ambulatory Visit: Payer: Self-pay

## 2020-05-23 VITALS — BP 148/88 | HR 88 | Ht 63.0 in | Wt 147.8 lb

## 2020-05-23 DIAGNOSIS — J431 Panlobular emphysema: Secondary | ICD-10-CM

## 2020-05-23 DIAGNOSIS — E782 Mixed hyperlipidemia: Secondary | ICD-10-CM

## 2020-05-23 DIAGNOSIS — I2510 Atherosclerotic heart disease of native coronary artery without angina pectoris: Secondary | ICD-10-CM

## 2020-05-23 DIAGNOSIS — I4819 Other persistent atrial fibrillation: Secondary | ICD-10-CM

## 2020-05-23 HISTORY — DX: Atherosclerotic heart disease of native coronary artery without angina pectoris: I25.10

## 2020-05-23 HISTORY — DX: Mixed hyperlipidemia: E78.2

## 2020-05-23 MED ORDER — NEXLIZET 180-10 MG PO TABS: 1.0000 | ORAL_TABLET | Freq: Every day | ORAL | 12 refills | Status: DC

## 2020-05-23 NOTE — Progress Notes (Signed)
Cardiology Office Note:    Date:  05/23/2020   ID:  Tina Jimenez, DOB Nov 01, 1940, MRN [id number]  PCP:  Marge Duncans, PA-C  Cardiologist:  Jenean Lindau, MD   Referring MD: Marge Duncans, PA-C    ASSESSMENT:    1. Panlobular emphysema ([place])   2. Coronary artery calcification seen on CT scan   3. Mixed hyperlipidemia   4. Persistent atrial fibrillation ([place])   5. Mixed dyslipidemia    PLAN:    In order of problems listed above:  1. Secondary prevention stressed with patient.  Importance of compliance with diet medication stressed and she vocalized understanding. 2. Coronary artery disease: She has calcifications on CT scan.  I discussed this with her at length and she vocalized understanding. 3. Persistent atrial fibrillation:I discussed with the patient atrial fibrillation, disease process. Management and therapy including rate and rhythm control, anticoagulation benefits and potential risks were discussed extensively with the patient. Patient had multiple questions which were answered to patient's satisfaction.  Not on anticoagulation because of gait instability.  She ambulates with a walker and she is not steady on her.  According to the patient and her son.  I think the risks outweigh the benefits and therefore will not initiate her on anticoagulation. 4. Mixed dyslipidemia: She cannot tolerate statins I stopped her Zetia. 5. I work including liver and lipid check from recent.  I discussed Nexlizet and she is agreeable.  We will initiate her today and she will be back in 6 weeks for liver lipid check. 6. Patient will be seen in follow-up appointment in 3 months or earlier if the patient has any concerns    Medication Adjustments/Labs and Tests Ordered: Current medicines are reviewed at length with the patient today.  Concerns regarding medicines are outlined above.  Orders Placed This Encounter  Procedures  . Hepatic function panel  . Lipid panel  . Basic metabolic  panel   Meds ordered this encounter  Medications  . Bempedoic Acid-Ezetimibe (NEXLIZET) 180-10 MG TABS    Sig: Take 1 tablet by mouth daily in the afternoon.    Dispense:  30 tablet    Refill:  12     No chief complaint on file.    History of Present Illness:    Tina Jimenez is a 79 y.o. female.  Patient has past medical history of pulmonary embolism, persistent atrial fibrillation, COPD and mixed dyslipidemia.  She has history of coronary calcification seen on CT scan.  She denies any problems at this time and takes care of activities of daily living.  Her son accompanies her for this visit.  She is brought in a wheelchair.  At the time of my evaluation, the patient is alert awake oriented and in no distress.  She has COPD and uses oxygen.  Past Medical History:  Diagnosis Date  . Abdominal pain 10/13/2019  . Abnormal blood chemistry 08/22/2019  . Acute blood loss anemia 06/22/2019  . Acute bronchitis with COPD ([place]) 08/15/2019  . Acute cystitis with hematuria 10/26/2019  . Acute hypoxemic respiratory failure ([place]) 07/08/2019  . Acute pain of left shoulder 10/26/2019  . Acute pulmonary embolism without acute cor pulmonale ([place]) 06/21/2019  . Adnexal mass   . Anemia 01/08/2020  . Arm DVT (deep venous thromboembolism), acute, left ([place]) 06/26/2019  . Atrial fibrillation ([place])   . Bladder mass 10/26/2019  . Compression fracture of body of thoracic vertebra ([place]) 2020  . COPD (chronic obstructive pulmonary disease) ([place])   .  Dependence on supplemental oxygen 04/14/2020  . GERD (gastroesophageal reflux disease)   . History of pulmonary embolism 10/02/2019  . Hyperlipemia   . Hypertension 08/15/2019  . Localized edema 08/22/2019  . Osteoporosis   . Other fatigue 01/08/2020  . Oxygen deficiency   . Paroxysmal atrial fibrillation ([place]) 06/20/2019  . Persistent atrial fibrillation ([place]) 08/22/2019  . Pneumonia 05/2019   COVID Pneumonia  . Pneumonia due to COVID-19 virus 07/08/2019  . Precordial  pain 06/20/2019  . Pulmonary embolism ([place]) 05/2019  . Spinal stenosis of lumbar region 06/14/2019  . Vitamin D deficiency 06/22/2019    Past Surgical History:  Procedure Laterality Date  . APPENDECTOMY    . KYPHOPLASTY      Current Medications: Current Meds  Medication Sig  . Ascorbic Acid (VITAMIN C) 500 MG CAPS Take 1 tablet by mouth daily.  . carvedilol (COREG) 6.25 MG tablet TAKE 1 TABLET BY MOUTH TWICE A DAY  . Co-Enzyme Q-10 100 MG CAPS Take 100 mg by mouth daily.  . digoxin (LANOXIN) 0.125 MG tablet TAKE 1 TABLET BY MOUTH DAILY  . ferrous sulfate 325 (65 FE) MG tablet TAKE 1 TABLET BY MOUTH EVERY DAY WITH BREAKFAST (Patient taking differently: Take 325 mg by mouth daily with breakfast.)  . fluticasone (FLONASE) 50 MCG/ACT nasal spray SPRAY 2 SPRAYS INTO EACH NOSTRIL EVERY DAY  . fluticasone furoate-vilanterol (BREO ELLIPTA) 100-25 MCG/INH AEPB Inhale 1 puff into the lungs daily.  Marland Kitchen ipratropium-albuterol (DUONEB) 0.5-2.5 (3) MG/3ML SOLN Take 3 mLs by nebulization in the morning, at noon, in the evening, and at bedtime.  Marland Kitchen loratadine (CLARITIN) 10 MG tablet TAKE 1 TABLET BY MOUTH ONCE DAILY FOR ALLERGIES  . omeprazole (PRILOSEC) 20 MG capsule TAKE 1 CAPSULE BY MOUTH EVERY DAY  . polyethylene glycol (MIRALAX / GLYCOLAX) 17 g packet Take 17 g by mouth daily.  . [DISCONTINUED] ezetimibe (ZETIA) 10 MG tablet TAKE 1 TABLET BY MOUTH EVERY DAY     Allergies:   Multaq [dronedarone], Klonopin [clonazepam], Lipitor [atorvastatin], and Simvastatin   Social History   Socioeconomic History  . Marital status: Widowed    Spouse name: Not on file  . Number of children: 4  . Years of education: Not on file  . Highest education level: Not on file  Occupational History  . Occupation: retired  Tobacco Use  . Smoking status: Former Smoker    Quit date: 11/01/2018    Years since quitting: 1.5  . Smokeless tobacco: Never Used  Vaping Use  . Vaping Use: Never used  Substance and Sexual  Activity  . Alcohol use: Never  . Drug use: Never  . Sexual activity: Not on file  Other Topics Concern  . Not on file  Social History Narrative  . Not on file   Social Determinants of Health   Financial Resource Strain: Not on file  Food Insecurity: No Food Insecurity  . Worried About Charity fundraiser in the Last Year: Never true  . Ran Out of Food in the Last Year: Never true  Transportation Needs: Not on file  Physical Activity: Not on file  Stress: Not on file  Social Connections: Not on file     Family History: The patient's family history includes Breast cancer in her sister; Lung cancer in her daughter and sister.  ROS:   Please see the history of present illness.    All other systems reviewed and are negative.  EKGs/Labs/Other Studies Reviewed:    The following studies  were reviewed today: I discussed my findings with the patient at length.   Recent Labs: 01/04/2020: TSH 2.150 04/08/2020: ALT 10; BUN 10; Creatinine, Ser 0.57; Hemoglobin 13.2; Platelets 171; Potassium 4.6; Sodium 144  Recent Lipid Panel    Component Value Date/Time   CHOL 202 (H) 04/08/2020 1123   TRIG 104 04/08/2020 1123   HDL 58 04/08/2020 1123   CHOLHDL 3.5 04/08/2020 1123   LDLCALC 125 (H) 04/08/2020 1123    Physical Exam:    VS:  BP (!) 148/88   Pulse 88   Ht 5\' 3"  (1.6 m)   Wt 147 lb 12.8 oz (67 kg)   SpO2 99%   BMI 26.18 kg/m     Wt Readings from Last 3 Encounters:  05/23/20 147 lb 12.8 oz (67 kg)  04/08/20 144 lb 9.6 oz (65.6 kg)  01/17/20 141 lb 9.6 oz (64.2 kg)     GEN: Patient is in no acute distress HEENT: Normal NECK: No JVD; No carotid bruits LYMPHATICS: No lymphadenopathy CARDIAC: Hear sounds regular, 2/6 systolic murmur at the apex. RESPIRATORY:  Clear to auscultation without rales, wheezing or rhonchi  ABDOMEN: Soft, non-tender, non-distended MUSCULOSKELETAL:  No edema; No deformity  SKIN: Warm and dry NEUROLOGIC:  Alert and oriented x 3 PSYCHIATRIC:   Normal affect   Signed, Jenean Lindau, MD  05/23/2020 3:20 PM    [facility]

## 2020-05-23 NOTE — Patient Instructions (Addendum)
Visit Information  Goals Addressed            This Visit's Progress   . Pharmacy Care Plan       CARE PLAN ENTRY (see longitudinal plan of care for additional care plan information)  Current Barriers:  . Chronic Disease Management support, education, and care coordination needs related to Hypertension, Hyperlipidemia, and COPD   Hypertension BP Readings from Last 3 Encounters:  04/08/20 (!) 142/80  01/17/20 (!) 180/84  01/04/20 122/78   . Pharmacist Clinical Goal(s): o Over the next 90 days, patient will work with PharmD and providers to achieve BP goal <140/90 . Current regimen:  o Carvedilol 6.25 mg bid . Interventions: o Discussed healthy diet and benefits on blood pressure.  o Encouraged patient to check blood pressure at home if daughter in law has cuff available.  o Discussed patient's history of white coat hypertension.  . Patient self care activities - Over the next 90 days, patient will: o Check BP if cuff available, document, and provide at future appointments o Ensure daily salt intake < 2300 mg/day  Hyperlipidemia Lab Results  Component Value Date/Time   LDLCALC 125 (H) 04/08/2020 11:23 AM   . Pharmacist Clinical Goal(s): o Over the next 90 days, patient will work with PharmD and providers to achieve LDL goal < 100 . Current regimen:  o Zetia 10 mg daily  o Coenzyme Q-10 100 mg daily  . Interventions: o Discussed patient's previous reactions to statins.  o Reviewed option of low dose statin weekly. Patient declines at this time.  o Encouraged continuing to incorporate vegetables and lean meats.  . Patient self care activities - Over the next 90 days, patient will: o Continue to eat healthy diet.  COPD . Pharmacist Clinical Goal(s) o Over the next 90 days, patient will work with PharmD and providers to minimize COPD exacerbations . Current regimen:   Flonase 50 mcg/actuation 2 sprays into each nostril every day  Breo Ellipta 100-25 mcg/inh 1 puff  into lungs daily   Duoneb 3 mls morning, noon, evening and bedtime  Loratadine 10 mg daily for allergies . Interventions: o Discussed inhaler regimen. Patient does not feel that [name] is helping her as much lately. She is willing to try a different inhaler if Tina Jimenez approves.  o Patient is in the doughnut hole and [name] has been quite expensive. Discussed options to avoid the doughnut hole in the future.  . Patient self care activities - Over the next 90 days, patient will: o Continue to use inhalers as prescribed.   Medication management . Pharmacist Clinical Goal(s): o Over the next 90 days, patient will work with PharmD and providers to maintain optimal medication adherence . Current pharmacy: [facility]  . Interventions o Comprehensive medication review performed. o Continue current medication management strategy . Patient self care activities - Over the next 90 days, patient will: o Focus on medication adherence by using pill box o Take medications as prescribed o Report any questions or concerns to PharmD and/or provider(s)  Initial goal documentation        The patient verbalized understanding of instructions, educational materials, and care plan provided today and agreed to receive a mailed copy of patient instructions, educational materials, and care plan.   Telephone follow up appointment with pharmacy team member scheduled for: 08/2020  Tina Jimenez, [facility]  Eating Plan for Chronic Obstructive Pulmonary Disease Chronic obstructive pulmonary disease (COPD) causes symptoms such as shortness of breath, coughing, and chest  discomfort. These symptoms can make it difficult to eat enough to maintain a healthy weight. Generally, people with COPD should eat a diet that is high in calories, protein, and other nutrients to maintain body weight and to keep the lungs as healthy as possible. Depending on the medicines you take and other health conditions you may have, your health care provider  may give you additional recommendations on what to eat or avoid. Talk with your health care provider about your goals for body weight, and work with a dietitian to develop an eating plan that is right for you. What are tips for following this plan? Reading food labels   Avoid foods with more than 300 milligrams (mg) of salt (sodium) per serving.  Choose foods that contain at least 4 grams (g) of fiber per serving. Try to eat 20-30 g of fiber each day.  Choose foods that are high in calories and protein, such as nuts, beans, yogurt, and cheese. Shopping  Do not buy foods labeled as diet, low-calorie, or low-fat.  If you are able to eat dairy products: ? Avoid low-fat or skim milk. ? Buy dairy products that have at least 2% fat.  Buy nutritional supplement drinks.  Buy grains and prepared foods labeled as enriched or fortified.  Consider buying low-sodium, pre-made foods to conserve energy for eating. Cooking  Add dry milk or protein powder to smoothies.  Cook with healthy fats, such as olive oil, canola oil, sunflower oil, and grapeseed oil.  Add oil, butter, cream cheese, or nut butters to foods to increase fat and calories.  To make foods easier to chew and swallow: ? Cook vegetables, pasta, and rice until soft. ? Cut or grind meat into very small pieces. ? Dip breads in liquid. Meal planning   Eat when you feel hungry.  Eat 5-6 small meals throughout the day.  Drink 6-8 glasses of water each day.  Do not drink liquids with meals. Drink liquids at the end of the meal to avoid feeling full too quickly.  Eat a variety of fruits and vegetables every day.  Ask for assistance from family or friends with planning and preparing meals as needed.  Avoid foods that cause you to feel bloated, such as carbonated drinks, fried foods, beans, broccoli, cabbage, and apples.  For older adults, ask your local agency on aging whether you are eligible for meal assistance programs,  such as Meals on Wheels. Lifestyle   Do not smoke.  Eat slowly. Take small bites and chew food well before swallowing.  Do not overeat. This may make it more difficult to breathe after eating.  Sit up while eating.  If needed, continue to use supplemental oxygen while eating.  Rest or relax for 30 minutes before and after eating.  Monitor your weight as told by your health care provider.  Exercise as told by your health care provider. What foods can I eat? Fruits All fresh, dried, canned, or frozen fruits that do not cause gas. Vegetables All fresh, canned (no salt added), or frozen vegetables that do not cause gas. Grains Whole grain bread. Enriched whole grain pasta. Fortified whole grain cereals. Fortified rice. Quinoa. Meats and other proteins Lean meat. Poultry. Fish. Dried beans. Unsalted nuts. Tofu. Eggs. Nut butters. Dairy Whole or 2% milk. Cheese. Yogurt. Fats and oils Olive oil. Canola oil. Butter. Margarine. Beverages Water. Vegetable juice (no salt added). Decaffeinated coffee. Decaffeinated or herbal tea. Seasonings and condiments Fresh or dried herbs. Low-salt or salt-free seasonings.  Low-sodium soy sauce. The items listed above may not be a complete list of foods and beverages you can eat. Contact a dietitian for more information. What foods are not recommended? Fruits Fruits that cause gas, such as apples or melon. Vegetables Vegetables that cause gas, such as broccoli, Brussels sprouts, cabbage, cauliflower, and onions. Canned vegetables with added salt. Meats and other proteins Fried meat. Salt-cured meat. Processed meat. Dairy Fat-free or low-fat milk, yogurt, or cheese. Processed cheese. Beverages Carbonated drinks. Caffeinated drinks, such as coffee, tea, and soft drinks. Juice. Alcohol. Vegetable juice with added salt. Seasonings and condiments Salt. Seasoning mixes with salt. Soy sauce. Angie Fava. Other foods Clear soup or broth. Fried foods.  Prepared frozen meals. The items listed above may not be a complete list of foods and beverages you should avoid. Contact a dietitian for more information. Summary  COPD symptoms can make it difficult to eat enough to maintain a healthy weight.  A COPD eating plan can help you maintain your body weight and keep your lungs as healthy as possible.  Eat a diet that is high in calories, protein, and other nutrients. Read labels to make sure that you are getting the right nutrients. Cook foods to make them easy to chew and swallow.  Eat 5-6 small meals throughout the day, and avoid foods that cause gas or make you feel bloated. This information is not intended to replace advice given to you by your health care provider. Make sure you discuss any questions you have with your health care provider. Document Revised: 09/22/2018 Document Reviewed: 08/17/2017 Elsevier Patient Education  [place].

## 2020-05-23 NOTE — Patient Instructions (Signed)
Medication Instructions:  Your physician has recommended you make the following change in your medication:   Stop Zetia Start Nexlizet 1 tablet daily.  *If you need a refill on your cardiac medications before your next appointment, please call your pharmacy*   Lab Work: Your physician recommends that you return for lab work in: 6 weeks (07/04/20) You need to have labs done when you are fasting.  You can come Monday through Friday 8:30 am to 12:00 pm and 1:15 to 4:30. You do not need to make an appointment as the order has already been placed. The labs you are going to have done are BMET, LFT and Lipids.  If you have labs (blood work) drawn today and your tests are completely normal, you will receive your results only by: Marland Kitchen MyChart Message (if you have MyChart) OR . A paper copy in the mail If you have any lab test that is abnormal or we need to change your treatment, we will call you to review the results.   Testing/Procedures: None ordered   Follow-Up: At [facility], you and your health needs are our priority.  As part of our continuing mission to provide you with exceptional heart care, we have created designated Provider Care Teams.  These Care Teams include your primary Cardiologist (physician) and Advanced Practice Providers (APPs -  Physician Assistants and Nurse Practitioners) who all work together to provide you with the care you need, when you need it.  We recommend signing up for the patient portal called "MyChart".  Sign up information is provided on this After Visit Summary.  MyChart is used to connect with patients for Virtual Visits (Telemedicine).  Patients are able to view lab/test results, encounter notes, upcoming appointments, etc.  Non-urgent messages can be sent to your provider as well.   To learn more about what you can do with MyChart, go to [email or web address].    Your next appointment:   6 month(s)  The format for your next appointment:   In  Person  Provider:   Jyl Heinz, MD   Other Instructions Bempedoic acid; Ezetimibe Tablets What is this medicine? BEMPEDOIC ACID; EZETIMIBE (BEM pe DOE ik AS id; ez ET i mibe) is used to lower the level of cholesterol in the blood. It is used with other cholesterol-lowering drugs. This medicine may be used for other purposes; ask your health care provider or pharmacist if you have questions. COMMON BRAND NAME(S): NEXLIZET What should I tell my health care provider before I take this medicine? They need to know if you have any of these conditions:  gout  kidney problems  liver problems  tendon problems  an unusual or allergic reaction to bempedoic acid, ezetimibe, other medicines, foods, dyes, or preservatives  pregnant or trying to become pregnant  breast-feeding How should I use this medicine? Take this medicine by mouth with a glass of water. Follow the directions on the prescription label. Do not cut, crush, or chew this medicine. Swallow the tablets whole. You can take it with or without food. If it upsets your stomach, take it with food. Take your doses at regular intervals. Do not take your medicine more often than directed. Talk to your pediatrician about the use of this medicine in children. Special care may be needed. Overdosage: If you think you have taken too much of this medicine contact a poison control center or emergency room at once. NOTE: This medicine is only for you. Do not share this medicine with others. What  if I miss a dose? If you miss a dose, take it as soon as you can. If it is almost time for your next dose, take only that dose. Do not take double or extra doses. What may interact with this medicine? Do not take this medicine with any of the following medications:  fenofibrate  gemfibrozil This medicine may also interact with the following medications:  antacids  cyclosporine  pravastatin  simvastatin  other medicines to lower  cholesterol or triglycerides This list may not describe all possible interactions. Give your health care provider a list of all the medicines, herbs, non-prescription drugs, or dietary supplements you use. Also tell them if you smoke, drink alcohol, or use illegal drugs. Some items may interact with your medicine. What should I watch for while using this medicine? Visit your health care professional for regular checks on your progress. Tell your health care professional if your symptoms do not start to get better or if they get worse. You may need blood work done while you are taking this medicine. This drug is only part of a total heart-health program. Your doctor or a dietician can suggest a low-cholesterol and low-fat diet to help. Avoid alcohol and smoking, and keep a proper exercise schedule. What side effects may I notice from receiving this medicine? Side effects that you should report to your doctor or health care professional as soon as possible:  allergic reactions like skin rash, itching or hives, swelling of the face, lips, or tongue  signs of gout such as swollen, red, warm, or tender joints, especially in the toes  signs of tendon problems such as tendon pain or swelling or if you are unable to move a joint Side effects that usually do not require medical attention (report these to your doctor or health care professional if they continue or are bothersome):  back pain  cold or flu-like symptoms  headache  muscle spasms  stomach upset or pain This list may not describe all possible side effects. Call your doctor for medical advice about side effects. You may report side effects to FDA at 1-800-FDA-1088. Where should I keep my medicine? Keep out of the reach of children. Store at room temperature between 15 and 30 degrees C (59 and 86 degrees F). Keep this medicine in the original container. Do not throw out the packet in the container. It keeps the medicine dry. Throw away any  unused medication after the expiration date. NOTE: This sheet is a summary. It may not cover all possible information. If you have questions about this medicine, talk to your doctor, pharmacist, or health care provider.  2020 Elsevier/Gold Standard (2018-08-17 13:09:47)

## 2020-05-24 ENCOUNTER — Telehealth: Payer: Self-pay

## 2020-05-24 NOTE — Progress Notes (Signed)
Chronic Care Management Pharmacy Assistant   Name: Tina Jimenez  MRN: [id number] DOB: 08-06-1940  Reason for Encounter: Medication Review    PCP : Marge Duncans, PA-C  Allergies:   Allergies  Allergen Reactions  . Multaq [Dronedarone] Diarrhea  . Klonopin [Clonazepam] Other (See Comments)    Pt can't remember reaction  . Lipitor [Atorvastatin] Other (See Comments)    Pt could barely lift arms  . Simvastatin Other (See Comments)    Pt can't remember reaction    Medications: Outpatient Encounter Medications as of 05/24/2020  Medication Sig  . Ascorbic Acid (VITAMIN C) 500 MG CAPS Take 1 tablet by mouth daily.  . Bempedoic Acid-Ezetimibe (NEXLIZET) 180-10 MG TABS Take 1 tablet by mouth daily in the afternoon.  . carvedilol (COREG) 6.25 MG tablet TAKE 1 TABLET BY MOUTH TWICE A DAY  . Co-Enzyme Q-10 100 MG CAPS Take 100 mg by mouth daily.  . digoxin (LANOXIN) 0.125 MG tablet TAKE 1 TABLET BY MOUTH DAILY  . ferrous sulfate 325 (65 FE) MG tablet TAKE 1 TABLET BY MOUTH EVERY DAY WITH BREAKFAST (Patient taking differently: Take 325 mg by mouth daily with breakfast.)  . fluticasone (FLONASE) 50 MCG/ACT nasal spray SPRAY 2 SPRAYS INTO EACH NOSTRIL EVERY DAY  . fluticasone furoate-vilanterol (BREO ELLIPTA) 100-25 MCG/INH AEPB Inhale 1 puff into the lungs daily.  Marland Kitchen ipratropium-albuterol (DUONEB) 0.5-2.5 (3) MG/3ML SOLN Take 3 mLs by nebulization in the morning, at noon, in the evening, and at bedtime.  Marland Kitchen loratadine (CLARITIN) 10 MG tablet TAKE 1 TABLET BY MOUTH ONCE DAILY FOR ALLERGIES  . omeprazole (PRILOSEC) 20 MG capsule TAKE 1 CAPSULE BY MOUTH EVERY DAY  . polyethylene glycol (MIRALAX / GLYCOLAX) 17 g packet Take 17 g by mouth daily.   No facility-administered encounter medications on file as of 05/24/2020.    Current Diagnosis: Patient Active Problem List   Diagnosis Date Noted  . Coronary artery calcification seen on CT scan 05/23/2020  . Mixed dyslipidemia 05/23/2020   . Atrial fibrillation ([place])   . GERD (gastroesophageal reflux disease)   . Osteoporosis   . Oxygen deficiency   . Dependence on supplemental oxygen 04/14/2020  . Other fatigue 01/08/2020  . Anemia 01/08/2020  . Acute cystitis with hematuria 10/26/2019  . Bladder mass 10/26/2019  . Acute pain of left shoulder 10/26/2019  . Adnexal mass   . Abdominal pain 10/13/2019  . History of pulmonary embolism 10/02/2019  . Abnormal blood chemistry 08/22/2019  . Persistent atrial fibrillation ([place]) 08/22/2019  . Localized edema 08/22/2019  . Hyperlipemia 08/15/2019  . Acute bronchitis with COPD ([place]) 08/15/2019  . COPD (chronic obstructive pulmonary disease) ([place]) 08/15/2019  . Hypertension 08/15/2019  . Acute hypoxemic respiratory failure ([place]) 07/08/2019  . Pneumonia due to COVID-19 virus 07/08/2019  . Arm DVT (deep venous thromboembolism), acute, left ([place]) 06/26/2019  . Acute blood loss anemia 06/22/2019  . Vitamin D deficiency 06/22/2019  . Acute pulmonary embolism without acute cor pulmonale ([place]) 06/21/2019  . Paroxysmal atrial fibrillation ([place]) 06/20/2019  . Precordial pain 06/20/2019  . Spinal stenosis of lumbar region 06/14/2019  . Pneumonia 05/2019  . Pulmonary embolism ([place]) 05/2019  . Compression fracture of body of thoracic vertebra ([place]) 2020      Follow-Up:  Patient Assistance Coordination - New application form filled out to [employer] for [employer]. Waiting for provider and patient's signature and proof of income.   Called patient to have them come into PCP office to sign documentation and  bring proof of income. Patient will come in the next few weeks to sign document. She is waiting on her income information to come in the mail.   Sherre Poot, CPP notified  Tina Jimenez, [facility] Clinical Pharmacist Assistant [phone]

## 2020-05-28 ENCOUNTER — Other Ambulatory Visit: Payer: Self-pay | Admitting: Physician Assistant

## 2020-06-10 DIAGNOSIS — J449 Chronic obstructive pulmonary disease, unspecified: Secondary | ICD-10-CM | POA: Diagnosis not present

## 2020-06-14 DIAGNOSIS — J449 Chronic obstructive pulmonary disease, unspecified: Secondary | ICD-10-CM | POA: Diagnosis not present

## 2020-07-04 ENCOUNTER — Other Ambulatory Visit: Payer: Self-pay | Admitting: Physician Assistant

## 2020-07-10 ENCOUNTER — Other Ambulatory Visit: Payer: Self-pay

## 2020-07-10 ENCOUNTER — Ambulatory Visit (INDEPENDENT_AMBULATORY_CARE_PROVIDER_SITE_OTHER): Payer: Medicare Other | Admitting: Physician Assistant

## 2020-07-10 ENCOUNTER — Encounter: Payer: Self-pay | Admitting: Physician Assistant

## 2020-07-10 VITALS — BP 138/84 | HR 46 | Temp 95.7°F | Ht 63.0 in | Wt 146.6 lb

## 2020-07-10 DIAGNOSIS — I4811 Longstanding persistent atrial fibrillation: Secondary | ICD-10-CM

## 2020-07-10 DIAGNOSIS — E782 Mixed hyperlipidemia: Secondary | ICD-10-CM | POA: Diagnosis not present

## 2020-07-10 DIAGNOSIS — I10 Essential (primary) hypertension: Secondary | ICD-10-CM | POA: Diagnosis not present

## 2020-07-10 DIAGNOSIS — J431 Panlobular emphysema: Secondary | ICD-10-CM

## 2020-07-10 NOTE — Progress Notes (Signed)
Subjective:  Patient ID: Tina Jimenez, female    DOB: 19-Feb-1941  Age: 80 y.o. MRN: [id number]  Chief Complaint  Patient presents with  . Hypertension     Tina Jimenez is a 80 year old Caucasian female here for f/u of hypertension, a-fib, and COPD.   HYPERTENSION Pt presents for follow up of hypertension.The patient is tolerating the medication well without side effects. Compliance with treatment has been good; including taking medication as directed , and following up as directed. Currently taking coreg 6.25mg  bid   ATRIAL-FIBRILLATION Diagnosed a-fib in Dec 2020 current treatment Digoxin 0.125mg  daily. She is followed by cardiologist Dr Geraldo Pitter. She is not currently taking anticoagulants due to unsteady gait and previous back surgery.  Dr. Geraldo Pitter states in his note that she will consider resuming anticoagulation therapy in the future.  She denies lightheadedness, syncope, or chest pain   COPD with O2 Dependence The patient has a history of COPD for over 5 years she quit smoking cigarettes of 2020.  She is O2 dependent on 3 L of oxygen continuously today she has been brought into the clinic in a wheelchair assisted by her adult son she denies current shortness of breath or chest pain vital signs were stable.  Oxygen saturation is 99%.  Her COPD is well controlled with Breo Ellipta and DuoNeb inhalers.  She is up to date on COVID vaccinations and influenza vaccination     Current Outpatient Medications on File Prior to Visit  Medication Sig Dispense Refill  . Ascorbic Acid (VITAMIN C) 500 MG CAPS Take 1 tablet by mouth daily.    . Bempedoic Acid-Ezetimibe (NEXLIZET) 180-10 MG TABS Take 1 tablet by mouth daily in the afternoon. 30 tablet 12  . BREO ELLIPTA 100-25 MCG/INH AEPB TAKE 1 PUFF BY MOUTH EVERY DAY 60 each 5  . carvedilol (COREG) 6.25 MG tablet TAKE 1 TABLET BY MOUTH TWICE A DAY 180 tablet 0  . Co-Enzyme Q-10 100 MG CAPS Take 100 mg by mouth daily.    . digoxin (LANOXIN)  0.125 MG tablet TAKE 1 TABLET BY MOUTH DAILY 90 tablet 2  . ferrous sulfate 325 (65 FE) MG tablet TAKE 1 TABLET BY MOUTH EVERY DAY WITH BREAKFAST (Patient taking differently: Take 325 mg by mouth daily with breakfast.) 90 tablet 1  . fluticasone (FLONASE) 50 MCG/ACT nasal spray SPRAY 2 SPRAYS INTO EACH NOSTRIL EVERY DAY 48 mL 2  . ipratropium-albuterol (DUONEB) 0.5-2.5 (3) MG/3ML SOLN Take 3 mLs by nebulization in the morning, at noon, in the evening, and at bedtime. 360 mL 1  . loratadine (CLARITIN) 10 MG tablet TAKE 1 TABLET BY MOUTH ONCE DAILY FOR ALLERGIES 90 tablet 1  . omeprazole (PRILOSEC) 20 MG capsule TAKE 1 CAPSULE BY MOUTH EVERY DAY 90 capsule 0  . polyethylene glycol (MIRALAX / GLYCOLAX) 17 g packet Take 17 g by mouth daily.     No current facility-administered medications on file prior to visit.   Past Medical History:  Diagnosis Date  . Abdominal pain 10/13/2019  . Abnormal blood chemistry 08/22/2019  . Acute blood loss anemia 06/22/2019  . Acute bronchitis with COPD ([place]) 08/15/2019  . Acute cystitis with hematuria 10/26/2019  . Acute hypoxemic respiratory failure ([place]) 07/08/2019  . Acute pain of left shoulder 10/26/2019  . Acute pulmonary embolism without acute cor pulmonale ([place]) 06/21/2019  . Adnexal mass   . Anemia 01/08/2020  . Arm DVT (deep venous thromboembolism), acute, left ([place]) 06/26/2019  . Atrial fibrillation ([place])   .  Bladder mass 10/26/2019  . Compression fracture of body of thoracic vertebra ([place]) 2020  . COPD (chronic obstructive pulmonary disease) ([place])   . Dependence on supplemental oxygen 04/14/2020  . GERD (gastroesophageal reflux disease)   . History of pulmonary embolism 10/02/2019  . Hyperlipemia   . Hypertension 08/15/2019  . Localized edema 08/22/2019  . Osteoporosis   . Other fatigue 01/08/2020  . Oxygen deficiency   . Paroxysmal atrial fibrillation ([place]) 06/20/2019  . Persistent atrial fibrillation ([place]) 08/22/2019  . Pneumonia 05/2019   COVID Pneumonia  .  Pneumonia due to COVID-19 virus 07/08/2019  . Precordial pain 06/20/2019  . Pulmonary embolism ([place]) 05/2019  . Spinal stenosis of lumbar region 06/14/2019  . Vitamin D deficiency 06/22/2019   Past Surgical History:  Procedure Laterality Date  . APPENDECTOMY    . KYPHOPLASTY      Family History  Problem Relation Age of Onset  . Breast cancer Sister   . Lung cancer Sister   . Lung cancer Daughter    Social History   Socioeconomic History  . Marital status: Widowed    Spouse name: Not on file  . Number of children: 4  . Years of education: Not on file  . Highest education level: Not on file  Occupational History  . Occupation: retired  Tobacco Use  . Smoking status: Former Smoker    Quit date: 11/01/2018    Years since quitting: 1.6  . Smokeless tobacco: Never Used  Vaping Use  . Vaping Use: Never used  Substance and Sexual Activity  . Alcohol use: Never  . Drug use: Never  . Sexual activity: Not on file  Other Topics Concern  . Not on file  Social History Narrative  . Not on file   Social Determinants of Health   Financial Resource Strain: Not on file  Food Insecurity: No Food Insecurity  . Worried About Charity fundraiser in the Last Year: Never true  . Ran Out of Food in the Last Year: Never true  Transportation Needs: Not on file  Physical Activity: Not on file  Stress: Not on file  Social Connections: Not on file   CONSTITUTIONAL: Negative for chills, fatigue, fever, unintentional weight gain and unintentional weight loss.  E/N/T: Negative for ear pain, nasal congestion and sore throat.  CARDIOVASCULAR: Negative for chest pain, dizziness, palpitations and pedal edema.  RESPIRATORY: Negative for recent cough and dyspnea.  GASTROINTESTINAL: Negative for abdominal pain, acid reflux symptoms, constipation, diarrhea, nausea and vomiting.  MSK: chronic back pain INTEGUMENTARY: Negative for rash.  NEUROLOGICAL: Negative for dizziness and headaches.  PSYCHIATRIC:  Negative for sleep disturbance and to question depression screen.  Negative for depression, negative for anhedonia.        Objective:  BP 138/84 (BP Location: Right Arm, Patient Position: Sitting, Cuff Size: Normal)   Pulse (!) 46   Temp (!) 95.7 F (35.4 C) (Temporal)   Ht 5\' 3"  (1.6 m)   Wt 146 lb 9.6 oz (66.5 kg)   SpO2 99% Comment: 3L O2  BMI 25.97 kg/m   BP/Weight 07/10/2020 05/23/2020 88/50/2774  Systolic BP 128 786 767  Diastolic BP 84 88 80  Wt. (Lbs) 146.6 147.8 144.6  BMI 25.97 26.18 25.61   PHYSICAL EXAM:   VS: BP 138/84 (BP Location: Right Arm, Patient Position: Sitting, Cuff Size: Normal)   Pulse (!) 46   Temp (!) 95.7 F (35.4 C) (Temporal)   Ht 5\' 3"  (1.6 m)   Wt 146  lb 9.6 oz (66.5 kg)   SpO2 99% Comment: 3L O2  BMI 25.97 kg/m   GEN: Well nourished, well developed, in no acute distress - in wheelchair using oxygen 3L  Cardiac: RRR; no murmurs, rubs, or gallops,no edema -Respiratory:  normal respiratory rate and pattern with no distress - normal breath sounds with no rales, rhonchi, wheezes or rubs  Skin: warm and dry, no rash  Neuro:  Alert and Oriented x 3, Strength and sensation are intact - CN II-Xii grossly intact Psych: euthymic mood, appropriate affect and demeanor             Lab Results  Component Value Date   WBC 10.3 04/08/2020   HGB 13.2 04/08/2020   HCT 40.8 04/08/2020   PLT 171 04/08/2020   GLUCOSE 95 04/08/2020   CHOL 202 (H) 04/08/2020   TRIG 104 04/08/2020   HDL 58 04/08/2020   LDLCALC 125 (H) 04/08/2020   ALT 10 04/08/2020   AST 16 04/08/2020   NA 144 04/08/2020   K 4.6 04/08/2020   CL 103 04/08/2020   CREATININE 0.57 04/08/2020   BUN 10 04/08/2020   CO2 29 04/08/2020   TSH 2.150 01/04/2020      Assessment & Plan:   1. Hypertension labwork pending Continue current meds  2. COPD not affecting current episode of care ([place]) - CBC with Differential - Comprehensive metabolic panel -Continue inhalers 3. Mixed  hyperlipidemia - CBC with Differential - Lipid Panel - Comprehensive metabolic panel   Follow up in 4 months     An After Visit Summary was printed and given to the patient.  [place] [phone]

## 2020-07-11 DIAGNOSIS — J449 Chronic obstructive pulmonary disease, unspecified: Secondary | ICD-10-CM | POA: Diagnosis not present

## 2020-07-11 LAB — CBC WITH DIFFERENTIAL/PLATELET
Basophils Absolute: 0.1 10*3/uL (ref 0.0–0.2)
Basos: 1 %
EOS (ABSOLUTE): 0.4 10*3/uL (ref 0.0–0.4)
Eos: 4 %
Hematocrit: 37.6 % (ref 34.0–46.6)
Hemoglobin: 12.6 g/dL (ref 11.1–15.9)
Immature Grans (Abs): 0 10*3/uL (ref 0.0–0.1)
Immature Granulocytes: 0 %
Lymphocytes Absolute: 1.1 10*3/uL (ref 0.7–3.1)
Lymphs: 11 %
MCH: 29 pg (ref 26.6–33.0)
MCHC: 33.5 g/dL (ref 31.5–35.7)
MCV: 87 fL (ref 79–97)
Monocytes Absolute: 0.7 10*3/uL (ref 0.1–0.9)
Monocytes: 7 %
Neutrophils Absolute: 7.5 10*3/uL — ABNORMAL HIGH (ref 1.4–7.0)
Neutrophils: 77 %
Platelets: 200 10*3/uL (ref 150–450)
RBC: 4.34 x10E6/uL (ref 3.77–5.28)
RDW: 12 % (ref 11.7–15.4)
WBC: 9.9 10*3/uL (ref 3.4–10.8)

## 2020-07-11 LAB — COMPREHENSIVE METABOLIC PANEL
ALT: 16 IU/L (ref 0–32)
AST: 16 IU/L (ref 0–40)
Albumin/Globulin Ratio: 1.8 (ref 1.2–2.2)
Albumin: 4.2 g/dL (ref 3.7–4.7)
Alkaline Phosphatase: 112 IU/L (ref 44–121)
BUN/Creatinine Ratio: 18 (ref 12–28)
BUN: 9 mg/dL (ref 8–27)
Bilirubin Total: 0.4 mg/dL (ref 0.0–1.2)
CO2: 30 mmol/L — ABNORMAL HIGH (ref 20–29)
Calcium: 9.6 mg/dL (ref 8.7–10.3)
Chloride: 97 mmol/L (ref 96–106)
Creatinine, Ser: 0.5 mg/dL — ABNORMAL LOW (ref 0.57–1.00)
GFR calc Af Amer: 108 mL/min/{1.73_m2} (ref >59)
GFR calc non Af Amer: 94 mL/min/{1.73_m2} (ref >59)
Globulin, Total: 2.4 g/dL (ref 1.5–4.5)
Glucose: 102 mg/dL — ABNORMAL HIGH (ref 65–99)
Potassium: 4.6 mmol/L (ref 3.5–5.2)
Sodium: 142 mmol/L (ref 134–144)
Total Protein: 6.6 g/dL (ref 6.0–8.5)

## 2020-07-11 LAB — LIPID PANEL
Chol/HDL Ratio: 3.5 ratio (ref 0.0–4.4)
Cholesterol, Total: 202 mg/dL — ABNORMAL HIGH (ref 100–199)
HDL: 57 mg/dL (ref >39)
LDL Chol Calc (NIH): 127 mg/dL — ABNORMAL HIGH (ref 0–99)
Triglycerides: 98 mg/dL (ref 0–149)
VLDL Cholesterol Cal: 18 mg/dL (ref 5–40)

## 2020-07-11 LAB — CARDIOVASCULAR RISK ASSESSMENT

## 2020-07-15 DIAGNOSIS — J449 Chronic obstructive pulmonary disease, unspecified: Secondary | ICD-10-CM | POA: Diagnosis not present

## 2020-07-18 ENCOUNTER — Other Ambulatory Visit: Payer: Self-pay | Admitting: Cardiology

## 2020-07-18 NOTE — Telephone Encounter (Signed)
Refill sent to pharmacy.   

## 2020-08-04 ENCOUNTER — Other Ambulatory Visit: Payer: Self-pay | Admitting: Physician Assistant

## 2020-08-11 DIAGNOSIS — J449 Chronic obstructive pulmonary disease, unspecified: Secondary | ICD-10-CM | POA: Diagnosis not present

## 2020-08-12 DIAGNOSIS — J449 Chronic obstructive pulmonary disease, unspecified: Secondary | ICD-10-CM | POA: Diagnosis not present

## 2020-08-19 ENCOUNTER — Other Ambulatory Visit: Payer: Self-pay

## 2020-08-19 ENCOUNTER — Ambulatory Visit (INDEPENDENT_AMBULATORY_CARE_PROVIDER_SITE_OTHER): Payer: Medicare Other

## 2020-08-19 DIAGNOSIS — E782 Mixed hyperlipidemia: Secondary | ICD-10-CM | POA: Diagnosis not present

## 2020-08-19 DIAGNOSIS — J449 Chronic obstructive pulmonary disease, unspecified: Secondary | ICD-10-CM

## 2020-08-19 DIAGNOSIS — I2510 Atherosclerotic heart disease of native coronary artery without angina pectoris: Secondary | ICD-10-CM | POA: Diagnosis not present

## 2020-08-19 DIAGNOSIS — I4811 Longstanding persistent atrial fibrillation: Secondary | ICD-10-CM

## 2020-08-19 NOTE — Progress Notes (Signed)
Chronic Care Management Pharmacy Note  08/22/2020 Name:  Tina Jimenez MRN:  [id number] DOB:  03-22-1941   Update:   Patient was unable to get Nexlizet due to PA. Pharmacist coordinated prior authorization and grant to cover copayment amount. Patient approved for [employer] until 07/2021. [facility] information. Patient aware of medication ready at no charge. Will send her son to pickup.   Subjective: Tina Jimenez is an 80 y.o. year old female who is a primary patient of Marge Duncans, [place].  The CCM team was consulted for assistance with disease management and care coordination needs.    Engaged with patient by telephone for follow up visit in response to provider referral for pharmacy case management and/or care coordination services.   Consent to Services:  The patient was given information about Chronic Care Management services, agreed to services, and gave verbal consent prior to initiation of services.  Please see initial visit note for detailed documentation.   Patient Care Team: Marge Duncans, Hershal Coria as PCP - General (Physician Assistant) Burnice Logan, [facility] as Pharmacist (Pharmacist)  Recent office visits: 07/11/2019 - stable labs.  Recent consult visits: 05/23/2020 - Cardiology - CAD - significant calcifications on CT scan. Zetia stopped and replaced with Nexlizet. Patient is intolerant to statin.  Hospital visits: None in previous 6 months  Objective:  Lab Results  Component Value Date   CREATININE 0.50 (L) 07/10/2020   BUN 9 07/10/2020   GFRNONAA 94 07/10/2020   GFRAA 108 07/10/2020   NA 142 07/10/2020   K 4.6 07/10/2020   CALCIUM 9.6 07/10/2020   CO2 30 (H) 07/10/2020    No results found for: HGBA1C, FRUCTOSAMINE, GFR, MICROALBUR  Last diabetic Eye exam: No results found for: HMDIABEYEEXA  Last diabetic Foot exam: No results found for: HMDIABFOOTEX   Lab Results  Component Value Date   CHOL 202 (H)  07/10/2020   HDL 57 07/10/2020   LDLCALC 127 (H) 07/10/2020   TRIG 98 07/10/2020   CHOLHDL 3.5 07/10/2020    Hepatic Function Latest Ref Rng & Units 07/10/2020 04/08/2020 01/04/2020  Total Protein 6.0 - 8.5 g/dL 6.6 6.9 6.7  Albumin 3.7 - 4.7 g/dL 4.2 4.2 4.1  AST 0 - 40 IU/L '16 16 15  ' ALT 0 - 32 IU/L '16 10 12  ' Alk Phosphatase 44 - 121 IU/L 112 107 96  Total Bilirubin 0.0 - 1.2 mg/dL 0.4 0.3 0.3    Lab Results  Component Value Date/Time   TSH 2.150 01/04/2020 10:20 AM    CBC Latest Ref Rng & Units 07/10/2020 04/08/2020 01/04/2020  WBC 3.4 - 10.8 x10E3/uL 9.9 10.3 10.2  Hemoglobin 11.1 - 15.9 g/dL 12.6 13.2 11.8  Hematocrit 34.0 - 46.6 % 37.6 40.8 37.2  Platelets 150 - 450 x10E3/uL 200 171 268    No results found for: VD25OH  Clinical ASCVD: Yes  The 10-year ASCVD risk score Mikey Bussing DC Jr., et al., 2013) is: 32.2%   Values used to calculate the score:     Age: 80 years     Sex: Female     Is Non-Hispanic African American: No     Diabetic: No     Tobacco smoker: No     Systolic Blood Pressure: 782 mmHg     Is BP treated: Yes     HDL Cholesterol: 57 mg/dL     Total Cholesterol: 202 mg/dL    Depression screen [facility] 2/9 05/21/2020 04/08/2020  Decreased Interest  0 0  Down, Depressed, Hopeless 0 0  PHQ - 2 Score 0 0     Other: (CHADS2VASc if Afib, MMRC or CAT for COPD, ACT, DEXA)  Social History   Tobacco Use  Smoking Status Former Smoker   Quit date: 11/01/2018   Years since quitting: 1.8  Smokeless Tobacco Never Used   BP Readings from Last 3 Encounters:  07/10/20 138/84  05/23/20 (!) 148/88  04/08/20 (!) 142/80   Pulse Readings from Last 3 Encounters:  07/10/20 (!) 46  05/23/20 88  04/08/20 72   Wt Readings from Last 3 Encounters:  07/10/20 146 lb 9.6 oz (66.5 kg)  05/23/20 147 lb 12.8 oz (67 kg)  04/08/20 144 lb 9.6 oz (65.6 kg)    Assessment/Interventions: Review of patient past medical history, allergies, medications, health status, including review  of consultants reports, laboratory and other test data, was performed as part of comprehensive evaluation and provision of chronic care management services.   SDOH:  (Social Determinants of Health) assessments and interventions performed: Yes   CCM Care Plan  Allergies  Allergen Reactions   Multaq [Dronedarone] Diarrhea   Klonopin [Clonazepam] Other (See Comments)    Pt can't remember reaction   Lipitor [Atorvastatin] Other (See Comments)    Pt could barely lift arms   Simvastatin Other (See Comments)    Pt can't remember reaction    Medications Reviewed Today    Reviewed by Burnice Logan, [facility] (Pharmacist) on 08/20/20 at [place] List Status: <None>  Medication Order Taking? Sig Documenting Provider Last Dose Status Informant  Ascorbic Acid (VITAMIN C) 500 MG CAPS [id number] Yes Take 1 tablet by mouth daily. [provider] Taking Active   Bempedoic Acid-Ezetimibe (NEXLIZET) 180-10 MG TABS [id number] No Take 1 tablet by mouth daily in the afternoon.  Patient not taking: Reported on 08/20/2020   RevankarReita Cliche, MD Not Taking Active   [name] 100-25 MCG/INH AEPB [id number] Yes TAKE 1 PUFF BY MOUTH EVERY DAY Marge Duncans, PA-C Taking Active   carvedilol (COREG) 6.25 MG tablet [id number] Yes TAKE 1 TABLET BY MOUTH TWICE A DAY Marge Duncans, PA-C Taking Active   Co-Enzyme Q-10 100 MG CAPS [id number] Yes Take 100 mg by mouth daily. [provider] Taking Active Self  digoxin (LANOXIN) 0.125 MG tablet [id number] Yes TAKE 1 TABLET BY MOUTH EVERY DAY Revankar, Reita Cliche, MD Taking Active   ferrous sulfate 325 (65 FE) MG tablet [id number] Yes TAKE 1 TABLET BY MOUTH EVERY DAY WITH BREAKFAST  Patient taking differently: Take 325 mg by mouth daily with breakfast.   Rochel Brome, MD Taking Active   fluticasone [facility]) 50 MCG/ACT nasal spray [id number]  SPRAY 2 SPRAYS INTO EACH NOSTRIL EVERY DAY Marge Duncans, PA-C  Active   ipratropium-albuterol (DUONEB) 0.5-2.5 (3) MG/3ML SOLN  [id number] Yes Take 3 mLs by nebulization in the morning, at noon, in the evening, and at bedtime. Rochel Brome, MD Taking Active   loratadine (CLARITIN) 10 MG tablet [id number] Yes TAKE 1 TABLET BY MOUTH ONCE DAILY FOR ALLERGIES Marge Duncans, PA-C Taking Active   omeprazole (PRILOSEC) 20 MG capsule [id number] Yes TAKE 1 CAPSULE BY MOUTH EVERY DAY Marge Duncans, PA-C Taking Active   polyethylene glycol (MIRALAX / GLYCOLAX) 17 g packet [id number] Yes Take 17 g by mouth daily. [provider] Taking Active           Patient Active Problem List   Diagnosis Date Noted   Coronary artery calcification seen on  CT scan 05/23/2020   Mixed dyslipidemia 05/23/2020   Atrial fibrillation (HCC)    GERD (gastroesophageal reflux disease)    Osteoporosis    Oxygen deficiency    Dependence on supplemental oxygen 04/14/2020   Other fatigue 01/08/2020   Anemia 01/08/2020   Acute cystitis with hematuria 10/26/2019   Bladder mass 10/26/2019   Acute pain of left shoulder 10/26/2019   Adnexal mass    Abdominal pain 10/13/2019   History of pulmonary embolism 10/02/2019   Abnormal blood chemistry 08/22/2019   Persistent atrial fibrillation ([place]) 08/22/2019   Localized edema 08/22/2019   Hyperlipemia 08/15/2019   Acute bronchitis with COPD ([place]) 08/15/2019   COPD (chronic obstructive pulmonary disease) ([place]) 08/15/2019   Hypertension 08/15/2019   Acute hypoxemic respiratory failure ([place]) 07/08/2019   Pneumonia due to COVID-19 virus 07/08/2019   Arm DVT (deep venous thromboembolism), acute, left ([place]) 06/26/2019   Acute blood loss anemia 06/22/2019   Vitamin D deficiency 06/22/2019   Acute pulmonary embolism without acute cor pulmonale (HCC) 06/21/2019   Paroxysmal atrial fibrillation ([place]) 06/20/2019   Precordial pain 06/20/2019   Spinal stenosis of lumbar region 06/14/2019   Pneumonia 05/2019   Pulmonary embolism ([place]) 05/2019   Compression fracture of body of  thoracic vertebra ([place]) 2020    Immunization History  Administered Date(s) Administered   Fluad Quad(high Dose 65+) 04/08/2020   Influenza, High Dose Seasonal PF 05/13/2016   PFIZER(Purple Top)SARS-COV-2 Vaccination 04/11/2020, 05/02/2020    Conditions to be addressed/monitored:  Hypertension, Hyperlipidemia, Atrial Fibrillation, Coronary Artery Disease, GERD, COPD and Osteoporosis  Care Plan : CCM Pharmacy Care Plan  Updates made by Burnice Logan, RPH since 08/22/2020 12:00 AM    Problem: cad, hld, copd   Priority: High  Onset Date: 08/19/2020    Long-Range Goal: Disease Managment   Start Date: 08/19/2020  Expected End Date: 08/19/2021  This Visit's Progress: On track  Priority: High  Note:    Current Barriers:   Unable to independently afford treatment regimen  Pharmacist Clinical Goal(s):   Over the next 90 days, patient will verbalize ability to afford treatment regimen through collaboration with PharmD and provider.   Interventions:  1:1 collaboration with Marge Duncans, PA-C regarding development and update of comprehensive plan of care as evidenced by provider attestation and co-signature  Inter-disciplinary care team collaboration (see longitudinal plan of care)  Comprehensive medication review performed; medication list updated in electronic medical record  Hyperlipidemia: (LDL goal < 55) -Uncontrolled -Current treatment:  nexlizet 180-10 mg daily in the afternoon -Medications previously tried: Zetia, statins  -Current dietary patterns: patient reports fruits/vegetables. Her son's help her cook meals so she doesn't have to.  -Current exercise habits: unstable gait limits exercise.  -Educated on Cholesterol goals;  Importance of limiting foods high in cholesterol; -Counseled on diet and exercise extensively Collaborated with [employer] grant, cover my meds prior authorization and [facility] for patient. Patient has been unable to  get medication filled.   COPD (Goal: control symptoms and prevent exacerbations) -Controlled -Current treatment   Breo Ellipta 100-25 mcg/inh 1 puff daily  Ipratropium-albuterol qid   Loratadine 10 mg daily for allergies  -Medications previously tried: none reported  -Gold Grade: Gold 3 (FEV1 30-49%) -Patient reports consistent use of maintenance inhaler -Frequency of rescue inhaler use: has nebulizer -Counseled on Benefits of consistent maintenance inhaler use -Recommended to continue current medication   Patient Goals/Self-Care Activities  Over the next 90 days, patient will:  -  take medications as prescribed Pick up and begin Nexlizet after pharmacist got PA approved and [place] to cover copay cost.   Follow Up Plan: Telephone follow up appointment with care management team member scheduled for: 02/19/2021      Medication Assistance: Application for Nexlizet copay   medication assistance program. in process.  Anticipated assistance start date 08/19/2020.  See plan of care for additional detail.  [id number] [address] [address]Phone: [phone] Fax: [phone] Uses pill box? Yes Pt endorses good compliance  We discussed: Benefits of medication synchronization, packaging and delivery as well as enhanced pharmacist oversight with [employer]. Patient decided to: Continue current medication management strategy  Care Plan and Follow Up Patient Decision:  Patient agrees to Care Plan and Follow-up.  Plan: Telephone follow up appointment with care management team member scheduled for:  02/19/2021

## 2020-08-21 ENCOUNTER — Other Ambulatory Visit: Payer: Self-pay | Admitting: Physician Assistant

## 2020-08-22 NOTE — Patient Instructions (Signed)
Visit Information  Goals Addressed            This Visit's Progress   . Improve My Heart Health-Coronary Artery Disease       Timeframe:  Long-Range Goal Priority:  High Start Date:          08/19/2020                   Expected End Date:  08/19/2021                       Follow Up Date 02/19/2021    - be open to making changes - learn about small changes that will make a big difference - learn my personal risk factors    Why is this important?    Lifestyle changes are key to improving the blood flow to your heart. Think about the things you can change and set a goal to live healthy.   Remember, when the blood vessels to your heart start to get clogged you may not have any symptoms.   Over time, they can get worse.   Don't ignore the signs, like chest pain, and get help right away.     Notes:     . Learn More About My Health       Timeframe:  Long-Range Goal Priority:  High Start Date:           08/18/2020                  Expected End Date:          08/18/2021              Follow Up Date 02/19/2021    - tell my story and reason for my visit - repeat what I heard to make sure I understand - bring a list of my medicines to the visit - speak up when I don't understand    Why is this important?    The best way to learn about your health and care is by talking to the doctor and nurse.   They will answer your questions and give you information in the way that you like best.    Notes:     Marland Kitchen Manage My Medicine       Timeframe:  Long-Range Goal Priority:  High Start Date:  08/19/2020                           Expected End Date:     08/19/2021                  Follow Up Date 02/19/2021    - call for medicine refill 2 or 3 days before it runs out - use a pillbox to sort medicine    Why is this important?   . These steps will help you keep on track with your medicines.   Notes:       Patient Care Plan: CCM Pharmacy Care Plan    Problem Identified: cad,  hld, copd   Priority: High  Onset Date: 08/19/2020    Long-Range Goal: Disease Managment   Start Date: 08/19/2020  Expected End Date: 08/19/2021  This Visit's Progress: On track  Priority: High  Note:    Current Barriers:  . Unable to independently afford treatment regimen  Pharmacist Clinical Goal(s):  Marland Kitchen Over the next 90 days, patient will verbalize ability to afford treatment regimen  through collaboration with PharmD and provider.   Interventions: . 1:1 collaboration with Marge Duncans, PA-C regarding development and update of comprehensive plan of care as evidenced by provider attestation and co-signature . Inter-disciplinary care team collaboration (see longitudinal plan of care) . Comprehensive medication review performed; medication list updated in electronic medical record  Hyperlipidemia: (LDL goal < 55) -Uncontrolled -Current treatment: . nexlizet 180-10 mg daily in the afternoon -Medications previously tried: Zetia, statins  -Current dietary patterns: patient reports fruits/vegetables. Her son's help her cook meals so she doesn't have to.  -Current exercise habits: unstable gait limits exercise.  -Educated on Cholesterol goals;  Importance of limiting foods high in cholesterol; -Counseled on diet and exercise extensively Collaborated with [employer] grant, cover my meds prior authorization and [facility] for Nexlizet to be filled for patient. Patient has been unable to get medication filled.   COPD (Goal: control symptoms and prevent exacerbations) -Controlled -Current treatment  . Breo Ellipta 100-25 mcg/inh 1 puff daily . Ipratropium-albuterol qid  . Loratadine 10 mg daily for allergies  -Medications previously tried: none reported  -Gold Grade: Gold 3 (FEV1 30-49%) -Patient reports consistent use of maintenance inhaler -Frequency of rescue inhaler use: has nebulizer -Counseled on Benefits of consistent maintenance inhaler use -Recommended to continue current  medication   Patient Goals/Self-Care Activities . Over the next 90 days, patient will:  - take medications as prescribed Pick up and begin Nexlizet after pharmacist got PA approved and [place] to cover copay cost.   Follow Up Plan: Telephone follow up appointment with care management team member scheduled for: 02/19/2021      The patient verbalized understanding of instructions, educational materials, and care plan provided today and declined offer to receive copy of patient instructions, educational materials, and care plan.  Telephone follow up appointment with pharmacy team member scheduled for: 02/19/2021  Burnice Logan, [facility]  Exercises to do While Sitting  Exercises that you do while sitting (chair exercises) can give you many of the same benefits as full exercise. Benefits include strengthening your heart, burning calories, and keeping muscles and joints healthy. Exercise can also improve your mood and help with depression and anxiety. You may benefit from chair exercises if you are unable to do standing exercises because of:  Diabetic foot pain.  Obesity.  Illness.  Arthritis.  Recovery from surgery or injury.  Breathing problems.  Balance problems.  Another type of disability. Before starting chair exercises, check with your health care provider or a physical therapist to find out how much exercise you can tolerate and which exercises are safe for you. If your health care provider approves:  Start out slowly and build up over time. Aim to work up to about 10-20 minutes for each exercise session.  Make exercise part of your daily routine.  Drink water when you exercise. Do not wait until you are thirsty. Drink every 10-15 minutes.  Stop exercising right away if you have pain, nausea, shortness of breath, or dizziness.  If you are exercising in a wheelchair, make sure to lock the wheels.  Ask your health care provider whether you can do tai chi or yoga.  Many positions in these mind-body exercises can be modified to do while seated. Warm-up Before starting other exercises: 1. Sit up as straight as you can. Have your knees bent at 90 degrees, which is the shape of the capital letter "L." Keep your feet flat on the floor. 2. Sit at the front edge of your  chair, if you can. 3. Pull in (tighten) the muscles in your abdomen and stretch your spine and neck as straight as you can. Hold this position for a few minutes. 4. Breathe in and out evenly. Try to concentrate on your breathing, and relax your mind. Stretching Exercise A: Arm stretch 1. Hold your arms out straight in front of your body. 2. Bend your hands at the wrist with your fingers pointing up, as if signaling someone to stop. Notice the slight tension in your forearms as you hold the position. 3. Keeping your arms out and your hands bent, rotate your hands outward as far as you can and hold this stretch. Aim to have your thumbs pointing up and your pinkie fingers pointing down. Slowly repeat arm stretches for one minute as tolerated. Exercise B: Leg stretch 1. If you can move your legs, try to "draw" letters on the floor with the toes of your foot. Write your name with one foot. 2. Write your name with the toes of your other foot. Slowly repeat the movements for one minute as tolerated. Exercise C: Reach for the sky 1. Reach your hands as far over your head as you can to stretch your spine. 2. Move your hands and arms as if you are climbing a rope. Slowly repeat the movements for one minute as tolerated. Range of motion exercises Exercise A: Shoulder roll 1. Let your arms hang loosely at your sides. 2. Lift just your shoulders up toward your ears, then let them relax back down. 3. When your shoulders feel loose, rotate your shoulders in backward and forward circles. Do shoulder rolls slowly for one minute as tolerated. Exercise B: March in place 1. As if you are marching, pump your  arms and lift your legs up and down. Lift your knees as high as you can. ? If you are unable to lift your knees, just pump your arms and move your ankles and feet up and down. March in place for one minute as tolerated. Exercise C: Seated jumping jacks 1. Let your arms hang down straight. 2. Keeping your arms straight, lift them up over your head. Aim to point your fingers to the ceiling. 3. While you lift your arms, straighten your legs and slide your heels along the floor to your sides, as wide as you can. 4. As you bring your arms back down to your sides, slide your legs back together. ? If you are unable to use your legs, just move your arms. Slowly repeat seated jumping jacks for one minute as tolerated. Strengthening exercises Exercise A: Shoulder squeeze 1. Hold your arms straight out from your body to your sides, with your elbows bent and your fists pointed at the ceiling. 2. Keeping your arms in the bent position, move them forward so your elbows and forearms meet in front of your face. 3. Open your arms back out as wide as you can with your elbows still bent, until you feel your shoulder blades squeezing together. Hold for 5 seconds. Slowly repeat the movements forward and backward for one minute as tolerated. Contact a health care provider if you:  Had to stop exercising due to any of the following: ? Pain. ? Nausea. ? Shortness of breath. ? Dizziness. ? Fatigue.  Have significant pain or soreness after exercising. Get help right away if you have:  Chest pain.  Difficulty breathing. These symptoms may represent a serious problem that is an emergency. Do not wait to see if the  symptoms will go away. Get medical help right away. Call your local emergency services (911 in the [place] Do not drive yourself to the hospital. This information is not intended to replace advice given to you by your health care provider. Make sure you discuss any questions you have with your health  care provider. Document Revised: 09/28/2019 Document Reviewed: 09/28/2019 Elsevier Patient Education  2021 [employer].

## 2020-08-28 ENCOUNTER — Other Ambulatory Visit: Payer: Self-pay | Admitting: Physician Assistant

## 2020-09-06 ENCOUNTER — Other Ambulatory Visit: Payer: Self-pay | Admitting: Physician Assistant

## 2020-09-08 DIAGNOSIS — J449 Chronic obstructive pulmonary disease, unspecified: Secondary | ICD-10-CM | POA: Diagnosis not present

## 2020-09-12 DIAGNOSIS — J449 Chronic obstructive pulmonary disease, unspecified: Secondary | ICD-10-CM | POA: Diagnosis not present

## 2020-09-19 ENCOUNTER — Telehealth: Payer: Self-pay

## 2020-09-19 NOTE — Progress Notes (Signed)
    Chronic Care Management Pharmacy Assistant   Name: Tina Jimenez  MRN: [id number] DOB: May 20, 1941   Reason for Encounter: Disease State for general adherence    Recent office visits:  08/19/20-Sara Owens Shark, [place], follow up 50mos 07/10/20-Sara Rosana Hoes, PA-C, hypertension, no medication changes 05/23/20-Dr. Revankar-cardiology,  Start Bempedoic 108-10mg , stop Ezetimibe   Recent consult visits:  none  Hospital visits:  None in previous 6 months  Medications: Outpatient Encounter Medications as of 09/19/2020  Medication Sig  . Ascorbic Acid (VITAMIN C) 500 MG CAPS Take 1 tablet by mouth daily.  . Bempedoic Acid-Ezetimibe (NEXLIZET) 180-10 MG TABS Take 1 tablet by mouth daily in the afternoon. (Patient not taking: Reported on 08/20/2020)  . BREO ELLIPTA 100-25 MCG/INH AEPB TAKE 1 PUFF BY MOUTH EVERY DAY  . carvedilol (COREG) 6.25 MG tablet TAKE 1 TABLET BY MOUTH TWICE A DAY  . Co-Enzyme Q-10 100 MG CAPS Take 100 mg by mouth daily.  . digoxin (LANOXIN) 0.125 MG tablet TAKE 1 TABLET BY MOUTH EVERY DAY  . ferrous sulfate 325 (65 FE) MG tablet TAKE 1 TABLET BY MOUTH EVERY DAY WITH BREAKFAST (Patient taking differently: Take 325 mg by mouth daily with breakfast.)  . fluticasone (FLONASE) 50 MCG/ACT nasal spray SPRAY 2 SPRAYS INTO EACH NOSTRIL EVERY DAY  . ipratropium-albuterol (DUONEB) 0.5-2.5 (3) MG/3ML SOLN TAKE 3 MLS BY NEBULIZATION EVERY MORNING, NOON, EVENING, AND BEDTIME.  Marland Kitchen loratadine (CLARITIN) 10 MG tablet TAKE 1 TABLET BY MOUTH ONCE DAILY FOR ALLERGIES  . omeprazole (PRILOSEC) 20 MG capsule TAKE 1 CAPSULE BY MOUTH EVERY DAY  . polyethylene glycol (MIRALAX / GLYCOLAX) 17 g packet Take 17 g by mouth daily.   No facility-administered encounter medications on file as of 09/19/2020.   Spoke to patient she is having some issues with her allergies, she said she was stuffy and sometimes her breathing is more difficult.  I told her that if that continues, for her to call the office and make  an appointment.  The patient stated she is having some issues with urination, she thinks she may have a UTI, she stated she is urinating more frequently and having some itching.  I asked if she had called the office, she said no, she stated the last time she was in they gave her a specimen bottle, she said she may get the son to bring in a sample for testing, he has not had much time lately.  The patient stated she is not very active due to back issues, her family does her shopping and cooks some of her meals, she stated she is very limited as to what she can do.  She reported taking her medication as directed and has no reported issues with getting them and no reported side effects.   Clarita Leber, [place] Pharmacist Assistant [phone]

## 2020-10-09 DIAGNOSIS — J449 Chronic obstructive pulmonary disease, unspecified: Secondary | ICD-10-CM | POA: Diagnosis not present

## 2020-10-10 DIAGNOSIS — M47816 Spondylosis without myelopathy or radiculopathy, lumbar region: Secondary | ICD-10-CM | POA: Diagnosis not present

## 2020-10-10 DIAGNOSIS — R072 Precordial pain: Secondary | ICD-10-CM | POA: Diagnosis not present

## 2020-10-10 DIAGNOSIS — I160 Hypertensive urgency: Secondary | ICD-10-CM | POA: Diagnosis not present

## 2020-10-10 DIAGNOSIS — M6259 Muscle wasting and atrophy, not elsewhere classified, multiple sites: Secondary | ICD-10-CM | POA: Diagnosis not present

## 2020-10-10 DIAGNOSIS — I499 Cardiac arrhythmia, unspecified: Secondary | ICD-10-CM | POA: Diagnosis not present

## 2020-10-10 DIAGNOSIS — K812 Acute cholecystitis with chronic cholecystitis: Secondary | ICD-10-CM | POA: Diagnosis not present

## 2020-10-10 DIAGNOSIS — K8050 Calculus of bile duct without cholangitis or cholecystitis without obstruction: Secondary | ICD-10-CM | POA: Diagnosis not present

## 2020-10-10 DIAGNOSIS — Z862 Personal history of diseases of the blood and blood-forming organs and certain disorders involving the immune mechanism: Secondary | ICD-10-CM | POA: Diagnosis not present

## 2020-10-10 DIAGNOSIS — R6889 Other general symptoms and signs: Secondary | ICD-10-CM | POA: Diagnosis not present

## 2020-10-10 DIAGNOSIS — K828 Other specified diseases of gallbladder: Secondary | ICD-10-CM | POA: Diagnosis not present

## 2020-10-10 DIAGNOSIS — K838 Other specified diseases of biliary tract: Secondary | ICD-10-CM | POA: Diagnosis not present

## 2020-10-10 DIAGNOSIS — Z79899 Other long term (current) drug therapy: Secondary | ICD-10-CM | POA: Diagnosis not present

## 2020-10-10 DIAGNOSIS — K8689 Other specified diseases of pancreas: Secondary | ICD-10-CM | POA: Diagnosis not present

## 2020-10-10 DIAGNOSIS — R079 Chest pain, unspecified: Secondary | ICD-10-CM | POA: Diagnosis not present

## 2020-10-10 DIAGNOSIS — R1311 Dysphagia, oral phase: Secondary | ICD-10-CM | POA: Diagnosis not present

## 2020-10-10 DIAGNOSIS — Z8616 Personal history of COVID-19: Secondary | ICD-10-CM | POA: Diagnosis not present

## 2020-10-10 DIAGNOSIS — K8000 Calculus of gallbladder with acute cholecystitis without obstruction: Secondary | ICD-10-CM | POA: Diagnosis not present

## 2020-10-10 DIAGNOSIS — K5909 Other constipation: Secondary | ICD-10-CM | POA: Diagnosis not present

## 2020-10-10 DIAGNOSIS — K219 Gastro-esophageal reflux disease without esophagitis: Secondary | ICD-10-CM | POA: Diagnosis not present

## 2020-10-10 DIAGNOSIS — Z9981 Dependence on supplemental oxygen: Secondary | ICD-10-CM | POA: Diagnosis not present

## 2020-10-10 DIAGNOSIS — M1990 Unspecified osteoarthritis, unspecified site: Secondary | ICD-10-CM | POA: Diagnosis not present

## 2020-10-10 DIAGNOSIS — R5381 Other malaise: Secondary | ICD-10-CM | POA: Diagnosis not present

## 2020-10-10 DIAGNOSIS — I10 Essential (primary) hypertension: Secondary | ICD-10-CM | POA: Diagnosis not present

## 2020-10-10 DIAGNOSIS — R109 Unspecified abdominal pain: Secondary | ICD-10-CM | POA: Diagnosis not present

## 2020-10-10 DIAGNOSIS — J9610 Chronic respiratory failure, unspecified whether with hypoxia or hypercapnia: Secondary | ICD-10-CM | POA: Diagnosis not present

## 2020-10-10 DIAGNOSIS — I959 Hypotension, unspecified: Secondary | ICD-10-CM | POA: Diagnosis not present

## 2020-10-10 DIAGNOSIS — Z743 Need for continuous supervision: Secondary | ICD-10-CM | POA: Diagnosis not present

## 2020-10-10 DIAGNOSIS — M6281 Muscle weakness (generalized): Secondary | ICD-10-CM | POA: Diagnosis not present

## 2020-10-10 DIAGNOSIS — K859 Acute pancreatitis without necrosis or infection, unspecified: Secondary | ICD-10-CM | POA: Diagnosis not present

## 2020-10-10 DIAGNOSIS — Z7401 Bed confinement status: Secondary | ICD-10-CM | POA: Diagnosis not present

## 2020-10-10 DIAGNOSIS — K8010 Calculus of gallbladder with chronic cholecystitis without obstruction: Secondary | ICD-10-CM | POA: Diagnosis not present

## 2020-10-10 DIAGNOSIS — J449 Chronic obstructive pulmonary disease, unspecified: Secondary | ICD-10-CM | POA: Diagnosis not present

## 2020-10-10 DIAGNOSIS — R7989 Other specified abnormal findings of blood chemistry: Secondary | ICD-10-CM | POA: Diagnosis not present

## 2020-10-10 DIAGNOSIS — R0789 Other chest pain: Secondary | ICD-10-CM | POA: Diagnosis not present

## 2020-10-10 DIAGNOSIS — Z48815 Encounter for surgical aftercare following surgery on the digestive system: Secondary | ICD-10-CM | POA: Diagnosis not present

## 2020-10-10 DIAGNOSIS — I482 Chronic atrial fibrillation, unspecified: Secondary | ICD-10-CM | POA: Diagnosis not present

## 2020-10-10 DIAGNOSIS — K8020 Calculus of gallbladder without cholecystitis without obstruction: Secondary | ICD-10-CM | POA: Diagnosis not present

## 2020-10-10 DIAGNOSIS — E785 Hyperlipidemia, unspecified: Secondary | ICD-10-CM | POA: Diagnosis not present

## 2020-10-10 DIAGNOSIS — R9431 Abnormal electrocardiogram [ECG] [EKG]: Secondary | ICD-10-CM | POA: Diagnosis not present

## 2020-10-10 DIAGNOSIS — Z720 Tobacco use: Secondary | ICD-10-CM | POA: Diagnosis not present

## 2020-10-10 DIAGNOSIS — I4891 Unspecified atrial fibrillation: Secondary | ICD-10-CM | POA: Diagnosis not present

## 2020-10-10 DIAGNOSIS — Z7982 Long term (current) use of aspirin: Secondary | ICD-10-CM | POA: Diagnosis not present

## 2020-10-11 DIAGNOSIS — I4891 Unspecified atrial fibrillation: Secondary | ICD-10-CM | POA: Diagnosis not present

## 2020-10-11 DIAGNOSIS — J449 Chronic obstructive pulmonary disease, unspecified: Secondary | ICD-10-CM | POA: Diagnosis not present

## 2020-10-11 DIAGNOSIS — K8000 Calculus of gallbladder with acute cholecystitis without obstruction: Secondary | ICD-10-CM | POA: Diagnosis not present

## 2020-10-12 DIAGNOSIS — J449 Chronic obstructive pulmonary disease, unspecified: Secondary | ICD-10-CM | POA: Diagnosis not present

## 2020-10-13 ENCOUNTER — Other Ambulatory Visit: Payer: Self-pay | Admitting: Family Medicine

## 2020-10-21 DIAGNOSIS — K56600 Partial intestinal obstruction, unspecified as to cause: Secondary | ICD-10-CM | POA: Diagnosis not present

## 2020-10-21 DIAGNOSIS — Z8616 Personal history of COVID-19: Secondary | ICD-10-CM | POA: Diagnosis not present

## 2020-10-21 DIAGNOSIS — E8809 Other disorders of plasma-protein metabolism, not elsewhere classified: Secondary | ICD-10-CM | POA: Diagnosis not present

## 2020-10-21 DIAGNOSIS — J690 Pneumonitis due to inhalation of food and vomit: Secondary | ICD-10-CM | POA: Diagnosis not present

## 2020-10-21 DIAGNOSIS — Z79899 Other long term (current) drug therapy: Secondary | ICD-10-CM | POA: Diagnosis not present

## 2020-10-21 DIAGNOSIS — R609 Edema, unspecified: Secondary | ICD-10-CM | POA: Diagnosis not present

## 2020-10-21 DIAGNOSIS — M6259 Muscle wasting and atrophy, not elsewhere classified, multiple sites: Secondary | ICD-10-CM | POA: Diagnosis not present

## 2020-10-21 DIAGNOSIS — R109 Unspecified abdominal pain: Secondary | ICD-10-CM | POA: Diagnosis not present

## 2020-10-21 DIAGNOSIS — I10 Essential (primary) hypertension: Secondary | ICD-10-CM | POA: Diagnosis not present

## 2020-10-21 DIAGNOSIS — I160 Hypertensive urgency: Secondary | ICD-10-CM | POA: Diagnosis not present

## 2020-10-21 DIAGNOSIS — R531 Weakness: Secondary | ICD-10-CM | POA: Diagnosis not present

## 2020-10-21 DIAGNOSIS — Z9981 Dependence on supplemental oxygen: Secondary | ICD-10-CM | POA: Diagnosis not present

## 2020-10-21 DIAGNOSIS — E876 Hypokalemia: Secondary | ICD-10-CM | POA: Diagnosis not present

## 2020-10-21 DIAGNOSIS — I4891 Unspecified atrial fibrillation: Secondary | ICD-10-CM | POA: Diagnosis not present

## 2020-10-21 DIAGNOSIS — K8000 Calculus of gallbladder with acute cholecystitis without obstruction: Secondary | ICD-10-CM | POA: Diagnosis not present

## 2020-10-21 DIAGNOSIS — K6389 Other specified diseases of intestine: Secondary | ICD-10-CM | POA: Diagnosis not present

## 2020-10-21 DIAGNOSIS — J439 Emphysema, unspecified: Secondary | ICD-10-CM | POA: Diagnosis not present

## 2020-10-21 DIAGNOSIS — J9811 Atelectasis: Secondary | ICD-10-CM | POA: Diagnosis not present

## 2020-10-21 DIAGNOSIS — Z86711 Personal history of pulmonary embolism: Secondary | ICD-10-CM | POA: Diagnosis not present

## 2020-10-21 DIAGNOSIS — J90 Pleural effusion, not elsewhere classified: Secondary | ICD-10-CM | POA: Diagnosis not present

## 2020-10-21 DIAGNOSIS — R1084 Generalized abdominal pain: Secondary | ICD-10-CM | POA: Diagnosis not present

## 2020-10-21 DIAGNOSIS — Z743 Need for continuous supervision: Secondary | ICD-10-CM | POA: Diagnosis not present

## 2020-10-21 DIAGNOSIS — R140 Abdominal distension (gaseous): Secondary | ICD-10-CM | POA: Diagnosis not present

## 2020-10-21 DIAGNOSIS — Z7401 Bed confinement status: Secondary | ICD-10-CM | POA: Diagnosis not present

## 2020-10-21 DIAGNOSIS — Z9049 Acquired absence of other specified parts of digestive tract: Secondary | ICD-10-CM | POA: Diagnosis not present

## 2020-10-21 DIAGNOSIS — K8050 Calculus of bile duct without cholangitis or cholecystitis without obstruction: Secondary | ICD-10-CM | POA: Diagnosis not present

## 2020-10-21 DIAGNOSIS — K859 Acute pancreatitis without necrosis or infection, unspecified: Secondary | ICD-10-CM | POA: Diagnosis not present

## 2020-10-21 DIAGNOSIS — R110 Nausea: Secondary | ICD-10-CM | POA: Diagnosis not present

## 2020-10-21 DIAGNOSIS — Z7982 Long term (current) use of aspirin: Secondary | ICD-10-CM | POA: Diagnosis not present

## 2020-10-21 DIAGNOSIS — K660 Peritoneal adhesions (postprocedural) (postinfection): Secondary | ICD-10-CM | POA: Diagnosis not present

## 2020-10-21 DIAGNOSIS — R338 Other retention of urine: Secondary | ICD-10-CM | POA: Diagnosis not present

## 2020-10-21 DIAGNOSIS — Z720 Tobacco use: Secondary | ICD-10-CM | POA: Diagnosis not present

## 2020-10-21 DIAGNOSIS — R9431 Abnormal electrocardiogram [ECG] [EKG]: Secondary | ICD-10-CM | POA: Diagnosis not present

## 2020-10-21 DIAGNOSIS — R5381 Other malaise: Secondary | ICD-10-CM | POA: Diagnosis not present

## 2020-10-21 DIAGNOSIS — K219 Gastro-esophageal reflux disease without esophagitis: Secondary | ICD-10-CM | POA: Diagnosis not present

## 2020-10-21 DIAGNOSIS — J8410 Pulmonary fibrosis, unspecified: Secondary | ICD-10-CM | POA: Diagnosis not present

## 2020-10-21 DIAGNOSIS — I517 Cardiomegaly: Secondary | ICD-10-CM | POA: Diagnosis not present

## 2020-10-21 DIAGNOSIS — L89309 Pressure ulcer of unspecified buttock, unspecified stage: Secondary | ICD-10-CM | POA: Diagnosis not present

## 2020-10-21 DIAGNOSIS — Z888 Allergy status to other drugs, medicaments and biological substances status: Secondary | ICD-10-CM | POA: Diagnosis not present

## 2020-10-21 DIAGNOSIS — R1311 Dysphagia, oral phase: Secondary | ICD-10-CM | POA: Diagnosis not present

## 2020-10-21 DIAGNOSIS — K56609 Unspecified intestinal obstruction, unspecified as to partial versus complete obstruction: Secondary | ICD-10-CM | POA: Diagnosis not present

## 2020-10-21 DIAGNOSIS — Z792 Long term (current) use of antibiotics: Secondary | ICD-10-CM | POA: Diagnosis not present

## 2020-10-21 DIAGNOSIS — K3189 Other diseases of stomach and duodenum: Secondary | ICD-10-CM | POA: Diagnosis not present

## 2020-10-21 DIAGNOSIS — K5909 Other constipation: Secondary | ICD-10-CM | POA: Diagnosis not present

## 2020-10-21 DIAGNOSIS — K567 Ileus, unspecified: Secondary | ICD-10-CM | POA: Diagnosis not present

## 2020-10-21 DIAGNOSIS — J9611 Chronic respiratory failure with hypoxia: Secondary | ICD-10-CM | POA: Diagnosis not present

## 2020-10-21 DIAGNOSIS — J449 Chronic obstructive pulmonary disease, unspecified: Secondary | ICD-10-CM | POA: Diagnosis not present

## 2020-10-21 DIAGNOSIS — K469 Unspecified abdominal hernia without obstruction or gangrene: Secondary | ICD-10-CM | POA: Diagnosis not present

## 2020-10-21 DIAGNOSIS — Z48815 Encounter for surgical aftercare following surgery on the digestive system: Secondary | ICD-10-CM | POA: Diagnosis not present

## 2020-10-21 DIAGNOSIS — I482 Chronic atrial fibrillation, unspecified: Secondary | ICD-10-CM | POA: Diagnosis not present

## 2020-10-21 DIAGNOSIS — R7989 Other specified abnormal findings of blood chemistry: Secondary | ICD-10-CM | POA: Diagnosis not present

## 2020-10-21 DIAGNOSIS — K439 Ventral hernia without obstruction or gangrene: Secondary | ICD-10-CM | POA: Diagnosis not present

## 2020-10-21 DIAGNOSIS — E7800 Pure hypercholesterolemia, unspecified: Secondary | ICD-10-CM | POA: Diagnosis not present

## 2020-10-21 DIAGNOSIS — M6281 Muscle weakness (generalized): Secondary | ICD-10-CM | POA: Diagnosis not present

## 2020-10-21 DIAGNOSIS — R918 Other nonspecific abnormal finding of lung field: Secondary | ICD-10-CM | POA: Diagnosis not present

## 2020-10-22 DIAGNOSIS — I482 Chronic atrial fibrillation, unspecified: Secondary | ICD-10-CM | POA: Diagnosis not present

## 2020-10-22 DIAGNOSIS — R5381 Other malaise: Secondary | ICD-10-CM | POA: Diagnosis not present

## 2020-10-22 DIAGNOSIS — K859 Acute pancreatitis without necrosis or infection, unspecified: Secondary | ICD-10-CM | POA: Diagnosis not present

## 2020-10-24 DIAGNOSIS — R609 Edema, unspecified: Secondary | ICD-10-CM | POA: Diagnosis not present

## 2020-10-29 DIAGNOSIS — R110 Nausea: Secondary | ICD-10-CM | POA: Diagnosis not present

## 2020-10-29 DIAGNOSIS — R140 Abdominal distension (gaseous): Secondary | ICD-10-CM | POA: Diagnosis not present

## 2020-10-29 DIAGNOSIS — R109 Unspecified abdominal pain: Secondary | ICD-10-CM | POA: Diagnosis not present

## 2020-10-29 DIAGNOSIS — R5381 Other malaise: Secondary | ICD-10-CM | POA: Diagnosis not present

## 2020-10-30 ENCOUNTER — Other Ambulatory Visit: Payer: Self-pay | Admitting: Physician Assistant

## 2020-10-30 DIAGNOSIS — R110 Nausea: Secondary | ICD-10-CM | POA: Diagnosis not present

## 2020-10-30 DIAGNOSIS — K567 Ileus, unspecified: Secondary | ICD-10-CM | POA: Diagnosis not present

## 2020-10-31 DIAGNOSIS — K56609 Unspecified intestinal obstruction, unspecified as to partial versus complete obstruction: Secondary | ICD-10-CM | POA: Diagnosis not present

## 2020-10-31 DIAGNOSIS — J439 Emphysema, unspecified: Secondary | ICD-10-CM | POA: Diagnosis not present

## 2020-10-31 DIAGNOSIS — K3189 Other diseases of stomach and duodenum: Secondary | ICD-10-CM | POA: Diagnosis not present

## 2020-10-31 DIAGNOSIS — Z792 Long term (current) use of antibiotics: Secondary | ICD-10-CM | POA: Diagnosis not present

## 2020-10-31 DIAGNOSIS — J8410 Pulmonary fibrosis, unspecified: Secondary | ICD-10-CM | POA: Diagnosis not present

## 2020-10-31 DIAGNOSIS — I10 Essential (primary) hypertension: Secondary | ICD-10-CM | POA: Diagnosis not present

## 2020-10-31 DIAGNOSIS — L89309 Pressure ulcer of unspecified buttock, unspecified stage: Secondary | ICD-10-CM | POA: Diagnosis not present

## 2020-10-31 DIAGNOSIS — I482 Chronic atrial fibrillation, unspecified: Secondary | ICD-10-CM | POA: Diagnosis not present

## 2020-10-31 DIAGNOSIS — L8990 Pressure ulcer of unspecified site, unspecified stage: Secondary | ICD-10-CM | POA: Diagnosis not present

## 2020-10-31 DIAGNOSIS — Z888 Allergy status to other drugs, medicaments and biological substances status: Secondary | ICD-10-CM | POA: Diagnosis not present

## 2020-10-31 DIAGNOSIS — R1311 Dysphagia, oral phase: Secondary | ICD-10-CM | POA: Diagnosis not present

## 2020-10-31 DIAGNOSIS — J9811 Atelectasis: Secondary | ICD-10-CM | POA: Diagnosis not present

## 2020-10-31 DIAGNOSIS — R918 Other nonspecific abnormal finding of lung field: Secondary | ICD-10-CM | POA: Diagnosis not present

## 2020-10-31 DIAGNOSIS — Z7982 Long term (current) use of aspirin: Secondary | ICD-10-CM | POA: Diagnosis not present

## 2020-10-31 DIAGNOSIS — I517 Cardiomegaly: Secondary | ICD-10-CM | POA: Diagnosis not present

## 2020-10-31 DIAGNOSIS — M6281 Muscle weakness (generalized): Secondary | ICD-10-CM | POA: Diagnosis not present

## 2020-10-31 DIAGNOSIS — K8050 Calculus of bile duct without cholangitis or cholecystitis without obstruction: Secondary | ICD-10-CM | POA: Diagnosis not present

## 2020-10-31 DIAGNOSIS — K859 Acute pancreatitis without necrosis or infection, unspecified: Secondary | ICD-10-CM | POA: Diagnosis not present

## 2020-10-31 DIAGNOSIS — I4891 Unspecified atrial fibrillation: Secondary | ICD-10-CM | POA: Diagnosis not present

## 2020-10-31 DIAGNOSIS — K439 Ventral hernia without obstruction or gangrene: Secondary | ICD-10-CM | POA: Diagnosis not present

## 2020-10-31 DIAGNOSIS — Z79899 Other long term (current) drug therapy: Secondary | ICD-10-CM | POA: Diagnosis not present

## 2020-10-31 DIAGNOSIS — E876 Hypokalemia: Secondary | ICD-10-CM | POA: Diagnosis not present

## 2020-10-31 DIAGNOSIS — M6259 Muscle wasting and atrophy, not elsewhere classified, multiple sites: Secondary | ICD-10-CM | POA: Diagnosis not present

## 2020-10-31 DIAGNOSIS — R609 Edema, unspecified: Secondary | ICD-10-CM | POA: Diagnosis not present

## 2020-10-31 DIAGNOSIS — E7800 Pure hypercholesterolemia, unspecified: Secondary | ICD-10-CM | POA: Diagnosis not present

## 2020-10-31 DIAGNOSIS — Z9049 Acquired absence of other specified parts of digestive tract: Secondary | ICD-10-CM | POA: Diagnosis not present

## 2020-10-31 DIAGNOSIS — K56600 Partial intestinal obstruction, unspecified as to cause: Secondary | ICD-10-CM | POA: Diagnosis not present

## 2020-10-31 DIAGNOSIS — K5939 Other megacolon: Secondary | ICD-10-CM | POA: Diagnosis not present

## 2020-10-31 DIAGNOSIS — I160 Hypertensive urgency: Secondary | ICD-10-CM | POA: Diagnosis not present

## 2020-10-31 DIAGNOSIS — Z9981 Dependence on supplemental oxygen: Secondary | ICD-10-CM | POA: Diagnosis not present

## 2020-10-31 DIAGNOSIS — Z743 Need for continuous supervision: Secondary | ICD-10-CM | POA: Diagnosis not present

## 2020-10-31 DIAGNOSIS — J9611 Chronic respiratory failure with hypoxia: Secondary | ICD-10-CM | POA: Diagnosis not present

## 2020-10-31 DIAGNOSIS — R338 Other retention of urine: Secondary | ICD-10-CM | POA: Diagnosis not present

## 2020-10-31 DIAGNOSIS — Z86711 Personal history of pulmonary embolism: Secondary | ICD-10-CM | POA: Diagnosis not present

## 2020-10-31 DIAGNOSIS — J690 Pneumonitis due to inhalation of food and vomit: Secondary | ICD-10-CM | POA: Diagnosis not present

## 2020-10-31 DIAGNOSIS — K219 Gastro-esophageal reflux disease without esophagitis: Secondary | ICD-10-CM | POA: Diagnosis not present

## 2020-10-31 DIAGNOSIS — K56699 Other intestinal obstruction unspecified as to partial versus complete obstruction: Secondary | ICD-10-CM | POA: Diagnosis not present

## 2020-10-31 DIAGNOSIS — J449 Chronic obstructive pulmonary disease, unspecified: Secondary | ICD-10-CM | POA: Diagnosis not present

## 2020-10-31 DIAGNOSIS — R531 Weakness: Secondary | ICD-10-CM | POA: Diagnosis not present

## 2020-10-31 DIAGNOSIS — K660 Peritoneal adhesions (postprocedural) (postinfection): Secondary | ICD-10-CM | POA: Diagnosis not present

## 2020-10-31 DIAGNOSIS — R109 Unspecified abdominal pain: Secondary | ICD-10-CM | POA: Diagnosis not present

## 2020-10-31 DIAGNOSIS — K5909 Other constipation: Secondary | ICD-10-CM | POA: Diagnosis not present

## 2020-10-31 DIAGNOSIS — R1084 Generalized abdominal pain: Secondary | ICD-10-CM | POA: Diagnosis not present

## 2020-10-31 DIAGNOSIS — K567 Ileus, unspecified: Secondary | ICD-10-CM | POA: Diagnosis not present

## 2020-10-31 DIAGNOSIS — K469 Unspecified abdominal hernia without obstruction or gangrene: Secondary | ICD-10-CM | POA: Diagnosis not present

## 2020-10-31 DIAGNOSIS — E8809 Other disorders of plasma-protein metabolism, not elsewhere classified: Secondary | ICD-10-CM | POA: Diagnosis not present

## 2020-10-31 DIAGNOSIS — J90 Pleural effusion, not elsewhere classified: Secondary | ICD-10-CM | POA: Diagnosis not present

## 2020-10-31 DIAGNOSIS — Z8616 Personal history of COVID-19: Secondary | ICD-10-CM | POA: Diagnosis not present

## 2020-10-31 DIAGNOSIS — K6389 Other specified diseases of intestine: Secondary | ICD-10-CM | POA: Diagnosis not present

## 2020-10-31 DIAGNOSIS — Z48815 Encounter for surgical aftercare following surgery on the digestive system: Secondary | ICD-10-CM | POA: Diagnosis not present

## 2020-10-31 DIAGNOSIS — R7989 Other specified abnormal findings of blood chemistry: Secondary | ICD-10-CM | POA: Diagnosis not present

## 2020-11-05 ENCOUNTER — Other Ambulatory Visit: Payer: Self-pay | Admitting: Family Medicine

## 2020-11-05 ENCOUNTER — Telehealth: Payer: Self-pay

## 2020-11-05 DIAGNOSIS — M6281 Muscle weakness (generalized): Secondary | ICD-10-CM | POA: Diagnosis not present

## 2020-11-05 DIAGNOSIS — J449 Chronic obstructive pulmonary disease, unspecified: Secondary | ICD-10-CM | POA: Diagnosis not present

## 2020-11-05 DIAGNOSIS — D692 Other nonthrombocytopenic purpura: Secondary | ICD-10-CM | POA: Diagnosis not present

## 2020-11-05 DIAGNOSIS — K859 Acute pancreatitis without necrosis or infection, unspecified: Secondary | ICD-10-CM | POA: Diagnosis not present

## 2020-11-05 DIAGNOSIS — I160 Hypertensive urgency: Secondary | ICD-10-CM | POA: Diagnosis not present

## 2020-11-05 DIAGNOSIS — R5381 Other malaise: Secondary | ICD-10-CM | POA: Diagnosis not present

## 2020-11-05 DIAGNOSIS — R112 Nausea with vomiting, unspecified: Secondary | ICD-10-CM | POA: Diagnosis not present

## 2020-11-05 DIAGNOSIS — R338 Other retention of urine: Secondary | ICD-10-CM | POA: Diagnosis not present

## 2020-11-05 DIAGNOSIS — K56600 Partial intestinal obstruction, unspecified as to cause: Secondary | ICD-10-CM | POA: Diagnosis not present

## 2020-11-05 DIAGNOSIS — K56609 Unspecified intestinal obstruction, unspecified as to partial versus complete obstruction: Secondary | ICD-10-CM | POA: Diagnosis not present

## 2020-11-05 DIAGNOSIS — K8050 Calculus of bile duct without cholangitis or cholecystitis without obstruction: Secondary | ICD-10-CM | POA: Diagnosis not present

## 2020-11-05 DIAGNOSIS — I10 Essential (primary) hypertension: Secondary | ICD-10-CM | POA: Diagnosis not present

## 2020-11-05 DIAGNOSIS — J9610 Chronic respiratory failure, unspecified whether with hypoxia or hypercapnia: Secondary | ICD-10-CM | POA: Diagnosis not present

## 2020-11-05 DIAGNOSIS — K5909 Other constipation: Secondary | ICD-10-CM | POA: Diagnosis not present

## 2020-11-05 DIAGNOSIS — Z48815 Encounter for surgical aftercare following surgery on the digestive system: Secondary | ICD-10-CM | POA: Diagnosis not present

## 2020-11-05 DIAGNOSIS — K567 Ileus, unspecified: Secondary | ICD-10-CM | POA: Diagnosis not present

## 2020-11-05 DIAGNOSIS — R1311 Dysphagia, oral phase: Secondary | ICD-10-CM | POA: Diagnosis not present

## 2020-11-05 DIAGNOSIS — I482 Chronic atrial fibrillation, unspecified: Secondary | ICD-10-CM | POA: Diagnosis not present

## 2020-11-05 DIAGNOSIS — Z743 Need for continuous supervision: Secondary | ICD-10-CM | POA: Diagnosis not present

## 2020-11-05 DIAGNOSIS — I4891 Unspecified atrial fibrillation: Secondary | ICD-10-CM | POA: Diagnosis not present

## 2020-11-05 DIAGNOSIS — J690 Pneumonitis due to inhalation of food and vomit: Secondary | ICD-10-CM | POA: Diagnosis not present

## 2020-11-05 DIAGNOSIS — K219 Gastro-esophageal reflux disease without esophagitis: Secondary | ICD-10-CM | POA: Diagnosis not present

## 2020-11-05 DIAGNOSIS — M6259 Muscle wasting and atrophy, not elsewhere classified, multiple sites: Secondary | ICD-10-CM | POA: Diagnosis not present

## 2020-11-05 DIAGNOSIS — L8990 Pressure ulcer of unspecified site, unspecified stage: Secondary | ICD-10-CM | POA: Diagnosis not present

## 2020-11-05 NOTE — Progress Notes (Signed)
    Chronic Care Management Pharmacy Assistant   Name: ANNASTON UPHAM  MRN: [id number] DOB: [birth year]  Attempted to reach patient x3.   Reason for Encounter: General Adherence and post-hospital call:    Recent office visits:  None noted since last CCM visit  Recent consult visits:  None noted since last CCM visit  Hospital visits:  None noted since last CCM visit   Medications: Outpatient Encounter Medications as of 11/05/2020  Medication Sig  . Ascorbic Acid (VITAMIN C) 500 MG CAPS Take 1 tablet by mouth daily.  . Bempedoic Acid-Ezetimibe (NEXLIZET) 180-10 MG TABS Take 1 tablet by mouth daily in the afternoon. (Patient not taking: Reported on 08/20/2020)  . BREO ELLIPTA 100-25 MCG/INH AEPB TAKE 1 PUFF BY MOUTH EVERY DAY  . carvedilol (COREG) 6.25 MG tablet TAKE 1 TABLET BY MOUTH TWICE A DAY  . Co-Enzyme Q-10 100 MG CAPS Take 100 mg by mouth daily.  . digoxin (LANOXIN) 0.125 MG tablet TAKE 1 TABLET BY MOUTH EVERY DAY  . ferrous sulfate 325 (65 FE) MG tablet Take 1 tablet (325 mg total) by mouth daily with breakfast.  . fluticasone (FLONASE) 50 MCG/ACT nasal spray SPRAY 2 SPRAYS INTO EACH NOSTRIL EVERY DAY  . ipratropium-albuterol (DUONEB) 0.5-2.5 (3) MG/3ML SOLN USE 1 NEBULE IN NEBULIZER EVERY MORNING, NOON, EVENING, AND BEDTIME.  Marland Kitchen loratadine (CLARITIN) 10 MG tablet TAKE 1 TABLET BY MOUTH ONCE DAILY FOR ALLERGIES  . omeprazole (PRILOSEC) 20 MG capsule TAKE 1 CAPSULE BY MOUTH EVERY DAY  . polyethylene glycol (MIRALAX / GLYCOLAX) 17 g packet Take 17 g by mouth daily.   No facility-administered encounter medications on file as of 11/05/2020.     Called patient and LVM: Tuesday, 11/05/2020 @ 11:26 am Wednesday, 11/06/20 @ 9:51 am Thursday, 11/07/2020 @ 9:25 am  Marcine Matar, [place] Clinical Pharmacist Assistant

## 2020-11-06 ENCOUNTER — Other Ambulatory Visit: Payer: Self-pay | Admitting: Physician Assistant

## 2020-11-06 DIAGNOSIS — J449 Chronic obstructive pulmonary disease, unspecified: Secondary | ICD-10-CM | POA: Diagnosis not present

## 2020-11-06 DIAGNOSIS — I482 Chronic atrial fibrillation, unspecified: Secondary | ICD-10-CM | POA: Diagnosis not present

## 2020-11-06 DIAGNOSIS — K567 Ileus, unspecified: Secondary | ICD-10-CM | POA: Diagnosis not present

## 2020-11-06 DIAGNOSIS — R5381 Other malaise: Secondary | ICD-10-CM | POA: Diagnosis not present

## 2020-11-07 ENCOUNTER — Ambulatory Visit: Payer: Medicare Other | Admitting: Physician Assistant

## 2020-11-12 DIAGNOSIS — J9610 Chronic respiratory failure, unspecified whether with hypoxia or hypercapnia: Secondary | ICD-10-CM | POA: Diagnosis not present

## 2020-11-12 DIAGNOSIS — R5381 Other malaise: Secondary | ICD-10-CM | POA: Diagnosis not present

## 2020-11-12 DIAGNOSIS — I482 Chronic atrial fibrillation, unspecified: Secondary | ICD-10-CM | POA: Diagnosis not present

## 2020-11-12 DIAGNOSIS — K567 Ileus, unspecified: Secondary | ICD-10-CM | POA: Diagnosis not present

## 2020-11-18 ENCOUNTER — Other Ambulatory Visit: Payer: Self-pay | Admitting: Physician Assistant

## 2020-11-19 ENCOUNTER — Telehealth: Payer: Self-pay

## 2020-11-19 NOTE — Progress Notes (Signed)
    Chronic Care Management Pharmacy Assistant   Name: Tina Jimenez  MRN: [id number] DOB: 06-Aug-1940  Tina Jimenez is an 80 y.o. year old female who presents for his follow-up CCM visit with the clinical pharmacist.   Reason for Encounter: Post-hospital call and disease state call for COPD    Recent office visits:  11/18/2020: Jerrell Belfast, NP (PCP)/ added Fluconazole 150mg . one dose by mouth / completed course zetia. / renewed rx for Loratadine 10 mg. Daily    Recent consult visits: None noted since last CCM visit   Hospital visits:  None noted since last CCM visit   Medications: Outpatient Encounter Medications as of 11/19/2020  Medication Sig   Ascorbic Acid (VITAMIN C) 500 MG CAPS Take 1 tablet by mouth daily.   Bempedoic Acid-Ezetimibe (NEXLIZET) 180-10 MG TABS Take 1 tablet by mouth daily in the afternoon. (Patient not taking: Reported on 08/20/2020)   BREO ELLIPTA 100-25 MCG/INH AEPB TAKE 1 PUFF BY MOUTH EVERY DAY   carvedilol (COREG) 6.25 MG tablet TAKE 1 TABLET BY MOUTH TWICE A DAY   Co-Enzyme Q-10 100 MG CAPS Take 100 mg by mouth daily.   digoxin (LANOXIN) 0.125 MG tablet TAKE 1 TABLET BY MOUTH EVERY DAY   ezetimibe (ZETIA) 10 MG tablet TAKE 1 TABLET BY MOUTH EVERY DAY   ferrous sulfate 325 (65 FE) MG tablet Take 1 tablet (325 mg total) by mouth daily with breakfast.   fluticasone (FLONASE) 50 MCG/ACT nasal spray SPRAY 2 SPRAYS INTO EACH NOSTRIL EVERY DAY   ipratropium-albuterol (DUONEB) 0.5-2.5 (3) MG/3ML SOLN USE 1 NEBULE IN NEBULIZER EVERY MORNING, NOON, EVENING, AND BEDTIME.   loratadine (CLARITIN) 10 MG tablet TAKE 1 TABLET BY MOUTH ONCE DAILY FOR ALLERGIES   omeprazole (PRILOSEC) 20 MG capsule TAKE 1 CAPSULE BY MOUTH EVERY DAY   polyethylene glycol (MIRALAX / GLYCOLAX) 17 g packet Take 17 g by mouth daily.   No facility-administered encounter medications on file as of 11/19/2020.     Current COPD regimen   Breo Ellipta 100-25 mcg/inh 1 puff  daily Ipratropium-albuterol .5-2.5/ use 1 nebule every morning, noon, evening and bedtime Loratadine 10 mg daily for allergies    No flowsheet data found.  Patient reports  COPD symptoms, including shortness of breath and fatigue.     What do you do when you are short of breath?  Patient stated that she uses Breo inhaler and uses her nebulizer at bedtime. She stated that she is doing very little outside right now, because at times she is finding it hard to breath inside with air conditioning running.   Patient reported that she is still not feeling very well from being in the hospital s/p laparoscopic cholecystectomy on 10/11/2020.  She stated that she has a UTI and it is keeping her up and down all night with bladder spasms and urgency to urinate.     STAR RATING DRUGS: N/A  Marcine Matar, [place] Clinical Pharmacist Assistant

## 2020-11-21 ENCOUNTER — Other Ambulatory Visit: Payer: Self-pay

## 2020-11-21 DIAGNOSIS — K859 Acute pancreatitis without necrosis or infection, unspecified: Secondary | ICD-10-CM | POA: Diagnosis not present

## 2020-11-21 DIAGNOSIS — K567 Ileus, unspecified: Secondary | ICD-10-CM | POA: Diagnosis not present

## 2020-11-21 DIAGNOSIS — D692 Other nonthrombocytopenic purpura: Secondary | ICD-10-CM | POA: Diagnosis not present

## 2020-11-21 DIAGNOSIS — R5381 Other malaise: Secondary | ICD-10-CM | POA: Diagnosis not present

## 2020-11-23 DIAGNOSIS — J690 Pneumonitis due to inhalation of food and vomit: Secondary | ICD-10-CM | POA: Diagnosis not present

## 2020-11-23 DIAGNOSIS — K219 Gastro-esophageal reflux disease without esophagitis: Secondary | ICD-10-CM | POA: Diagnosis not present

## 2020-11-23 DIAGNOSIS — Z9981 Dependence on supplemental oxygen: Secondary | ICD-10-CM | POA: Diagnosis not present

## 2020-11-23 DIAGNOSIS — K567 Ileus, unspecified: Secondary | ICD-10-CM | POA: Diagnosis not present

## 2020-11-23 DIAGNOSIS — J9610 Chronic respiratory failure, unspecified whether with hypoxia or hypercapnia: Secondary | ICD-10-CM | POA: Diagnosis not present

## 2020-11-23 DIAGNOSIS — K8050 Calculus of bile duct without cholangitis or cholecystitis without obstruction: Secondary | ICD-10-CM | POA: Diagnosis not present

## 2020-11-23 DIAGNOSIS — I482 Chronic atrial fibrillation, unspecified: Secondary | ICD-10-CM | POA: Diagnosis not present

## 2020-11-23 DIAGNOSIS — I10 Essential (primary) hypertension: Secondary | ICD-10-CM | POA: Diagnosis not present

## 2020-11-23 DIAGNOSIS — K56600 Partial intestinal obstruction, unspecified as to cause: Secondary | ICD-10-CM | POA: Diagnosis not present

## 2020-11-23 DIAGNOSIS — R339 Retention of urine, unspecified: Secondary | ICD-10-CM | POA: Diagnosis not present

## 2020-11-23 DIAGNOSIS — J449 Chronic obstructive pulmonary disease, unspecified: Secondary | ICD-10-CM | POA: Diagnosis not present

## 2020-11-23 DIAGNOSIS — D692 Other nonthrombocytopenic purpura: Secondary | ICD-10-CM | POA: Diagnosis not present

## 2020-11-23 DIAGNOSIS — R1311 Dysphagia, oral phase: Secondary | ICD-10-CM | POA: Diagnosis not present

## 2020-11-24 DIAGNOSIS — K56600 Partial intestinal obstruction, unspecified as to cause: Secondary | ICD-10-CM | POA: Diagnosis not present

## 2020-11-24 DIAGNOSIS — K567 Ileus, unspecified: Secondary | ICD-10-CM | POA: Diagnosis not present

## 2020-11-24 DIAGNOSIS — R1311 Dysphagia, oral phase: Secondary | ICD-10-CM | POA: Diagnosis not present

## 2020-11-24 DIAGNOSIS — Z9981 Dependence on supplemental oxygen: Secondary | ICD-10-CM | POA: Diagnosis not present

## 2020-11-24 DIAGNOSIS — J449 Chronic obstructive pulmonary disease, unspecified: Secondary | ICD-10-CM | POA: Diagnosis not present

## 2020-11-24 DIAGNOSIS — I10 Essential (primary) hypertension: Secondary | ICD-10-CM | POA: Diagnosis not present

## 2020-11-24 DIAGNOSIS — R339 Retention of urine, unspecified: Secondary | ICD-10-CM | POA: Diagnosis not present

## 2020-11-24 DIAGNOSIS — J690 Pneumonitis due to inhalation of food and vomit: Secondary | ICD-10-CM | POA: Diagnosis not present

## 2020-11-24 DIAGNOSIS — J9610 Chronic respiratory failure, unspecified whether with hypoxia or hypercapnia: Secondary | ICD-10-CM | POA: Diagnosis not present

## 2020-11-24 DIAGNOSIS — K219 Gastro-esophageal reflux disease without esophagitis: Secondary | ICD-10-CM | POA: Diagnosis not present

## 2020-11-24 DIAGNOSIS — I482 Chronic atrial fibrillation, unspecified: Secondary | ICD-10-CM | POA: Diagnosis not present

## 2020-11-24 DIAGNOSIS — K8050 Calculus of bile duct without cholangitis or cholecystitis without obstruction: Secondary | ICD-10-CM | POA: Diagnosis not present

## 2020-11-24 DIAGNOSIS — D692 Other nonthrombocytopenic purpura: Secondary | ICD-10-CM | POA: Diagnosis not present

## 2020-11-25 ENCOUNTER — Ambulatory Visit: Payer: Medicare Other | Admitting: Cardiology

## 2020-11-25 ENCOUNTER — Telehealth: Payer: Self-pay

## 2020-11-25 NOTE — Telephone Encounter (Signed)
Lorie called to report that Tina Jimenez has been released from [facility] and is scheduled to be seen in the office later this week.  They need orders for Home Health Aide.  They are concerned that she had been taking ASA before her admission but not since she was in the hospital or at rehab.  Marge Duncans, PA advised that they hold the aspirin for now.  Will reassess at her appointment later this week.  She is having problems with her bowels.  She is taking miralax twice daily and colace daily.  They are going to call us back if she does not have a bowel movement today.

## 2020-11-27 DIAGNOSIS — K219 Gastro-esophageal reflux disease without esophagitis: Secondary | ICD-10-CM | POA: Diagnosis not present

## 2020-11-27 DIAGNOSIS — R339 Retention of urine, unspecified: Secondary | ICD-10-CM | POA: Diagnosis not present

## 2020-11-27 DIAGNOSIS — J690 Pneumonitis due to inhalation of food and vomit: Secondary | ICD-10-CM | POA: Diagnosis not present

## 2020-11-27 DIAGNOSIS — Z9981 Dependence on supplemental oxygen: Secondary | ICD-10-CM | POA: Diagnosis not present

## 2020-11-27 DIAGNOSIS — I482 Chronic atrial fibrillation, unspecified: Secondary | ICD-10-CM | POA: Diagnosis not present

## 2020-11-27 DIAGNOSIS — D692 Other nonthrombocytopenic purpura: Secondary | ICD-10-CM | POA: Diagnosis not present

## 2020-11-27 DIAGNOSIS — R1311 Dysphagia, oral phase: Secondary | ICD-10-CM | POA: Diagnosis not present

## 2020-11-27 DIAGNOSIS — J9610 Chronic respiratory failure, unspecified whether with hypoxia or hypercapnia: Secondary | ICD-10-CM | POA: Diagnosis not present

## 2020-11-27 DIAGNOSIS — I10 Essential (primary) hypertension: Secondary | ICD-10-CM | POA: Diagnosis not present

## 2020-11-27 DIAGNOSIS — K56600 Partial intestinal obstruction, unspecified as to cause: Secondary | ICD-10-CM | POA: Diagnosis not present

## 2020-11-27 DIAGNOSIS — K8050 Calculus of bile duct without cholangitis or cholecystitis without obstruction: Secondary | ICD-10-CM | POA: Diagnosis not present

## 2020-11-27 DIAGNOSIS — K567 Ileus, unspecified: Secondary | ICD-10-CM | POA: Diagnosis not present

## 2020-11-27 DIAGNOSIS — J449 Chronic obstructive pulmonary disease, unspecified: Secondary | ICD-10-CM | POA: Diagnosis not present

## 2020-11-27 NOTE — Progress Notes (Addendum)
Established Patient Office Visit  Subjective:  Patient ID: Tina Jimenez, female    DOB: September 10, 1940  Age: 80 y.o. MRN: [id number]  CC: Hospital follow-up  HPI Tina Jimenez is a 80 year old Caucasian female that presents for hospital follow-up. She is in a wheelchair with supplemental oxygen via [place] in place. She is accompanied by her son who helps supplement history. [name] underwent laparoscopic cholecystectomy was admitted on 10/10/20 to [facility] with pancreatitis and cholelithiasis. She underwent laparoscopic cholecystectomy with Dr Lilia Pro on 10/11/20. She was hospitalized until 10/21/20 when she d/c to [facility] due to weakness and debility related to underlying health condition of COPD with O2 dependence and a-fib. She developed an ileus in small intestine while at [facility]. She was re-hospitalized at [facility] 10/31/20-11/05/20. She was treated with NG tube, NPO for bowel rest, and Diflucan for vaginal candidiasis. She returned to [facility] home where she was cared for until d/c on 11/21/20. She is receiving home health assistance with ADLs. She states she feels stronger each day but has decreased appetite.Bowel movements are soft and regular. Abd remains "sore" but she is walking daily around her home with a walker.   Past Medical History:  Diagnosis Date   Abdominal pain 10/13/2019   Abnormal blood chemistry 08/22/2019   Acute blood loss anemia 06/22/2019   Acute bronchitis with COPD ([place]) 08/15/2019   Acute cystitis with hematuria 10/26/2019   Acute hypoxemic respiratory failure ([place]) 07/08/2019   Acute pain of left shoulder 10/26/2019   Acute pulmonary embolism without acute cor pulmonale (HCC) 06/21/2019   Adnexal mass    Anemia 01/08/2020   Arm DVT (deep venous thromboembolism), acute, left (HCC) 06/26/2019   Atrial fibrillation (HCC)    Bladder mass 10/26/2019   Compression fracture of body of thoracic vertebra ([place]) 2020    COPD (chronic obstructive pulmonary disease) (HCC)    Coronary artery calcification seen on CT scan 05/23/2020   Dependence on supplemental oxygen 04/14/2020   GERD (gastroesophageal reflux disease)    History of pulmonary embolism 10/02/2019   Hyperlipemia    Hypertension 08/15/2019   Localized edema 08/22/2019   Mixed dyslipidemia 05/23/2020   Osteoporosis    Other fatigue 01/08/2020   Oxygen deficiency    Paroxysmal atrial fibrillation (HCC) 06/20/2019   Persistent atrial fibrillation ([place]) 08/22/2019   Pneumonia 05/2019   COVID Pneumonia   Pneumonia due to COVID-19 virus 07/08/2019   Precordial pain 06/20/2019   Pulmonary embolism ([place]) 05/2019   Spinal stenosis of lumbar region 06/14/2019   Vitamin D deficiency 06/22/2019    Past Surgical History:  Procedure Laterality Date   APPENDECTOMY     KYPHOPLASTY      Family History  Problem Relation Age of Onset   Breast cancer Sister    Lung cancer Sister    Lung cancer Daughter     Social History   Socioeconomic History   Marital status: Widowed    Spouse name: Not on file   Number of children: 4   Years of education: Not on file   Highest education level: Not on file  Occupational History   Occupation: retired  Tobacco Use   Smoking status: Former    Pack years: 0.00    Types: Cigarettes    Quit date: 11/01/2018    Years since quitting: 2.0   Smokeless tobacco: Never  Vaping Use   Vaping Use: Never used  Substance and Sexual Activity   Alcohol use: Never  Drug use: Never   Sexual activity: Not on file  Other Topics Concern   Not on file  Social History Narrative   Not on file   Social Determinants of Health   Financial Resource Strain: Not on file  Food Insecurity: No Food Insecurity   Worried About Running Out of Food in the Last Year: Never true   Ran Out of Food in the Last Year: Never true  Transportation Needs: Not on file  Physical Activity: Not on file  Stress: Not on file  Social Connections: Not on  file  Intimate Partner Violence: Not on file    Outpatient Medications Prior to Visit  Medication Sig Dispense Refill   ALAVERT 10 MG dissolvable tablet Take 10 mg by mouth daily.     Ascorbic Acid (VITAMIN C) 500 MG CAPS Take 1 tablet by mouth daily.     Bempedoic Acid-Ezetimibe (NEXLIZET) 180-10 MG TABS Take 1 tablet by mouth daily in the afternoon. (Patient not taking: Reported on 08/20/2020) 30 tablet 12   carvedilol (COREG) 6.25 MG tablet Take 6.25 mg by mouth 2 (two) times daily with a meal.     Co-Enzyme Q-10 100 MG CAPS Take 100 mg by mouth daily.     digoxin (LANOXIN) 0.125 MG tablet Take 0.125 mg by mouth daily.     docusate sodium (COLACE) 250 MG capsule Take 1 capsule by mouth 2 (two) times daily.     ezetimibe (ZETIA) 10 MG tablet Take 10 mg by mouth daily.     famotidine (PEPCID) 20 MG tablet Take 20 mg by mouth daily.     ferrous sulfate 325 (65 FE) MG tablet Take 1 tablet (325 mg total) by mouth daily with breakfast. 90 tablet 0   fluticasone (FLONASE) 50 MCG/ACT nasal spray Place 2 sprays into both nostrils daily.     fluticasone furoate-vilanterol (BREO ELLIPTA) 100-25 MCG/INH AEPB Inhale 1 puff into the lungs daily.     ipratropium-albuterol (DUONEB) 0.5-2.5 (3) MG/3ML SOLN Take 3 mLs by nebulization 4 (four) times daily.     loratadine (CLARITIN) 10 MG tablet Take 10 mg by mouth daily.     omeprazole (PRILOSEC) 20 MG capsule Take 20 mg by mouth daily.     ondansetron (ZOFRAN) 4 MG tablet Take 4 mg by mouth every 8 (eight) hours as needed for nausea/vomiting.     polyethylene glycol (MIRALAX / GLYCOLAX) 17 g packet Take 17 g by mouth daily.     No facility-administered medications prior to visit.    Allergies  Allergen Reactions   Multaq [Dronedarone] Diarrhea   Klonopin [Clonazepam] Other (See Comments)    Pt can't remember reaction   Lipitor [Atorvastatin] Other (See Comments)    Pt could barely lift arms   Simvastatin Other (See Comments)    Pt can't remember  reaction    ROS Review of Systems  Constitutional:  Positive for appetite change (decreased) and fatigue. Negative for unexpected weight change.  HENT:  Negative for congestion, ear pain, rhinorrhea, sinus pressure, sinus pain and tinnitus.   Eyes:  Negative for pain.  Respiratory:  Positive for shortness of breath (O2 dependence). Negative for cough.   Cardiovascular:  Negative for chest pain, palpitations and leg swelling.  Gastrointestinal:  Positive for abdominal pain. Negative for constipation, diarrhea, nausea and vomiting.  Endocrine: Positive for cold intolerance. Negative for heat intolerance, polydipsia, polyphagia and polyuria.  Genitourinary:  Positive for frequency. Negative for dysuria and hematuria.  Vaginal itching, burning  Musculoskeletal:  Negative for arthralgias, back pain, joint swelling and myalgias.  Skin:  Negative for rash.  Allergic/Immunologic: Negative for environmental allergies.  Neurological:  Positive for weakness. Negative for dizziness and headaches.  Hematological:  Negative for adenopathy.  Psychiatric/Behavioral:  Negative for decreased concentration and sleep disturbance. The patient is not nervous/anxious.      Objective:    Physical Exam Constitutional:      Appearance: She is ill-appearing.  HENT:     Mouth/Throat:     Mouth: Mucous membranes are dry.  Cardiovascular:     Rate and Rhythm: Normal rate. Rhythm irregular.     Pulses: Normal pulses.     Heart sounds: Normal heart sounds.  Pulmonary:     Effort: Pulmonary effort is normal.     Comments: Decreased breath sounds bilaterally Abdominal:     General: Bowel sounds are normal.     Palpations: Abdomen is soft.     Tenderness: There is no abdominal tenderness. There is no guarding.  Skin:    General: Skin is warm and dry.     Capillary Refill: Capillary refill takes less than 2 seconds.  Neurological:     General: No focal deficit present.     Mental Status: She is alert  and oriented to person, place, and time.  Psychiatric:        Mood and Affect: Mood normal.        Behavior: Behavior normal.   BP (!) 160/88   Pulse 78   Temp (!) 97.1 F (36.2 C)   Resp 20   Ht 5\' 3"  (1.6 m)   Wt 138 lb 9.6 oz (62.9 kg)   BMI 24.55 kg/m   Wt Readings from Last 3 Encounters:  07/10/20 146 lb 9.6 oz (66.5 kg)  05/23/20 147 lb 12.8 oz (67 kg)  04/08/20 144 lb 9.6 oz (65.6 kg)     Health Maintenance Due  Topic Date Due   Hepatitis C Screening  Never done   TETANUS/TDAP  Never done   Zoster Vaccines- Shingrix (1 of 2) Never done   DEXA SCAN  Never done   PNA vac Low Risk Adult (1 of 2 - PCV13) Never done   COVID-19 Vaccine (3 - Pfizer risk series) 05/30/2020     Lab Results  Component Value Date   TSH 2.150 01/04/2020   Lab Results  Component Value Date   WBC 9.9 07/10/2020   HGB 12.6 07/10/2020   HCT 37.6 07/10/2020   MCV 87 07/10/2020   PLT 200 07/10/2020   Lab Results  Component Value Date   NA 142 07/10/2020   K 4.6 07/10/2020   CO2 30 (H) 07/10/2020   GLUCOSE 102 (H) 07/10/2020   BUN 9 07/10/2020   CREATININE 0.50 (L) 07/10/2020   BILITOT 0.4 07/10/2020   ALKPHOS 112 07/10/2020   AST 16 07/10/2020   ALT 16 07/10/2020   PROT 6.6 07/10/2020   ALBUMIN 4.2 07/10/2020   CALCIUM 9.6 07/10/2020   ANIONGAP 7 10/14/2019   Lab Results  Component Value Date   CHOL 202 (H) 07/10/2020   Lab Results  Component Value Date   HDL 57 07/10/2020   Lab Results  Component Value Date   LDLCALC 127 (H) 07/10/2020   Lab Results  Component Value Date   TRIG 98 07/10/2020   Lab Results  Component Value Date   CHOLHDL 3.5 07/10/2020      Assessment & Plan:   1.  Ileus following gastrointestinal surgery (HCC) -small frequent meals -push fluids -rest with periods of walking  2. S/P cholecystectomy - CBC with Differential/Platelet - Comprehensive metabolic panel  3. Chronic respiratory failure with hypoxia, on home oxygen therapy  (HCC) -continue O2 at 4l/min  4. Mixed hyperlipidemia - Lipid panel -continue Nexlizit  5. Primary hypertension-not at goal - CBC with Differential/Platelet - Comprehensive metabolic panel - Lipid panel - continue Coreg 6.25 BID  6. Gastroesophageal reflux disease without esophagitis -Continue Prilosec 20 mg daily  7. Vaginal candidiasis - fluconazole (DIFLUCAN) 150 MG tablet; Take 1 tablet (150 mg total) by mouth once for 1 dose.  Dispense: 1 tablet; Refill: 0  8. Urinary frequency - POCT urinalysis dipstick  9. Chronic obstructive pulmonary disease, unspecified COPD type ([place]) -Breo-Ellipta inhaler daily -Duoneb as needed  10. Persistent atrial fibrillation (HCC) -continue ASA 81 mg daily -Digoxin 0.125 mg daily -Coreg 6.25 mg BID  11. Age-related physical debility  -fall precautions -use walker for ambulation -continue home health to assist with ADLs   Rest with periods of walking Push fluids Take Diflucan 150 mg for vaginal yeast infection Continue medications Follow-up in 43-months   Follow-up: 35-months with PCP  Signed, Rip Harbour, NP

## 2020-11-28 ENCOUNTER — Ambulatory Visit (INDEPENDENT_AMBULATORY_CARE_PROVIDER_SITE_OTHER): Payer: Medicare Other | Admitting: Nurse Practitioner

## 2020-11-28 ENCOUNTER — Other Ambulatory Visit: Payer: Self-pay

## 2020-11-28 ENCOUNTER — Encounter: Payer: Self-pay | Admitting: Nurse Practitioner

## 2020-11-28 ENCOUNTER — Other Ambulatory Visit: Payer: Self-pay | Admitting: Nurse Practitioner

## 2020-11-28 VITALS — BP 160/88 | HR 78 | Temp 97.1°F | Resp 20 | Ht 63.0 in | Wt 138.6 lb

## 2020-11-28 DIAGNOSIS — J449 Chronic obstructive pulmonary disease, unspecified: Secondary | ICD-10-CM | POA: Diagnosis not present

## 2020-11-28 DIAGNOSIS — J9611 Chronic respiratory failure with hypoxia: Secondary | ICD-10-CM | POA: Diagnosis not present

## 2020-11-28 DIAGNOSIS — R350 Frequency of micturition: Secondary | ICD-10-CM

## 2020-11-28 DIAGNOSIS — R54 Age-related physical debility: Secondary | ICD-10-CM | POA: Diagnosis not present

## 2020-11-28 DIAGNOSIS — I10 Essential (primary) hypertension: Secondary | ICD-10-CM

## 2020-11-28 DIAGNOSIS — Z9981 Dependence on supplemental oxygen: Secondary | ICD-10-CM

## 2020-11-28 DIAGNOSIS — K219 Gastro-esophageal reflux disease without esophagitis: Secondary | ICD-10-CM

## 2020-11-28 DIAGNOSIS — E782 Mixed hyperlipidemia: Secondary | ICD-10-CM | POA: Diagnosis not present

## 2020-11-28 DIAGNOSIS — B3731 Acute candidiasis of vulva and vagina: Secondary | ICD-10-CM

## 2020-11-28 DIAGNOSIS — K567 Ileus, unspecified: Secondary | ICD-10-CM | POA: Diagnosis not present

## 2020-11-28 DIAGNOSIS — Z9049 Acquired absence of other specified parts of digestive tract: Secondary | ICD-10-CM | POA: Diagnosis not present

## 2020-11-28 DIAGNOSIS — B373 Candidiasis of vulva and vagina: Secondary | ICD-10-CM

## 2020-11-28 DIAGNOSIS — I4819 Other persistent atrial fibrillation: Secondary | ICD-10-CM

## 2020-11-28 DIAGNOSIS — K9189 Other postprocedural complications and disorders of digestive system: Secondary | ICD-10-CM

## 2020-11-28 LAB — POCT URINALYSIS DIPSTICK
Bilirubin, UA: NEGATIVE
Blood, UA: NEGATIVE
Glucose, UA: NEGATIVE
Ketones, UA: NEGATIVE
Leukocytes, UA: NEGATIVE
Nitrite, UA: NEGATIVE
Protein, UA: POSITIVE — AB
Spec Grav, UA: 1.015 (ref 1.010–1.025)
Urobilinogen, UA: 0.2 E.U./dL
pH, UA: 7 (ref 5.0–8.0)

## 2020-11-28 MED ORDER — OMEPRAZOLE 20 MG PO CPDR: 20.0000 mg | DELAYED_RELEASE_CAPSULE | Freq: Every day | ORAL | 1 refills | Status: DC

## 2020-11-28 MED ORDER — FLUCONAZOLE 150 MG PO TABS: 150.0000 mg | ORAL_TABLET | Freq: Once | ORAL | 0 refills | Status: AC

## 2020-11-28 NOTE — Patient Instructions (Addendum)
Rest with periods of walking Push fluids Take Diflucan 150 mg for vaginal yeast infection Continue medications Follow-up in 15-month   Vaginal Yeast Infection, Adult  Vaginal yeast infection is a condition that causes vaginal discharge as well as soreness, swelling, and redness (inflammation) of the vagina. This is a common condition. Some women get this infectionfrequently. What are the causes? This condition is caused by a change in the normal balance of the yeast (candida) and bacteria that live in the vagina. This change causes an overgrowth ofyeast, which causes the inflammation. What increases the risk? The condition is more likely to develop in women who: Take antibiotic medicines. Have diabetes. Take birth control pills. Are pregnant. Douche often. Have a weak body defense system (immune system). Have been taking steroid medicines for a long time. Frequently wear tight clothing. What are the signs or symptoms? Symptoms of this condition include: White, thick, creamy vaginal discharge. Swelling, itching, redness, and irritation of the vagina. The lips of the vagina (vulva) may be affected as well. Pain or a burning feeling while urinating. Pain during sex. How is this diagnosed? This condition is diagnosed based on: Your medical history. A physical exam. A pelvic exam. Your health care provider will examine a sample of your vaginal discharge under a microscope. Your health care provider may send this sample for testing to confirm the diagnosis. How is this treated? This condition is treated with medicine. Medicines may be over-the-counter or prescription. You may be told to use one or more of the following: Medicine that is taken by mouth (orally). Medicine that is applied as a cream (topically). Medicine that is inserted directly into the vagina (suppository). Follow these instructions at home:  Lifestyle Do not have sex until your health care provider approves. Tell  your sex partner that you have a yeast infection. That person should go to his or her health care provider and ask if they should also be treated. Do not wear tight clothes, such as pantyhose or tight pants. Wear breathable cotton underwear. General instructions Take or apply over-the-counter and prescription medicines only as told by your health care provider. Eat more yogurt. This may help to keep your yeast infection from returning. Do not use tampons until your health care provider approves. Try taking a sitz bath to help with discomfort. This is a warm water bath that is taken while you are sitting down. The water should only come up to your hips and should cover your buttocks. Do this 3-4 times per day or as told by your health care provider. Do not douche. If you have diabetes, keep your blood sugar levels under control. Keep all follow-up visits as told by your health care provider. This is important. Contact a health care provider if: You have a fever. Your symptoms go away and then return. Your symptoms do not get better with treatment. Your symptoms get worse. You have new symptoms. You develop blisters in or around your vagina. You have blood coming from your vagina and it is not your menstrual period. You develop pain in your abdomen. Summary Vaginal yeast infection is a condition that causes discharge as well as soreness, swelling, and redness (inflammation) of the vagina. This condition is treated with medicine. Medicines may be over-the-counter or prescription. Take or apply over-the-counter and prescription medicines only as told by your health care provider. Do not douche. Do not have sex or use tampons until your health care provider approves. Contact a health care provider if your  symptoms do not get better with treatment or your symptoms go away and then return. This information is not intended to replace advice given to you by your health care provider. Make sure you  discuss any questions you have with your healthcare provider. Document Revised: 05/22/2020 Document Reviewed: 10/18/2017 Elsevier Patient Education  [place].   Exercises to do While Sitting  Exercises that you do while sitting (chair exercises) can give you many of the same benefits as full exercise. Benefits include strengthening your heart, burning calories, and keeping muscles and joints healthy. Exercise can also improve your mood and help with depression andanxiety. You may benefit from chair exercises if you are unable to do standing exercises because of: Diabetic foot pain. Obesity. Illness. Arthritis. Recovery from surgery or injury. Breathing problems. Balance problems. Another type of disability. Before starting chair exercises, check with your health care provider or a physical therapist to find out how much exercise you can tolerate and which exercises are safe for you. If your health care provider approves: Start out slowly and build up over time. Aim to work up to about 10-20 minutes for each exercise session. Make exercise part of your daily routine. Drink water when you exercise. Do not wait until you are thirsty. Drink every 10-15 minutes. Stop exercising right away if you have pain, nausea, shortness of breath, or dizziness. If you are exercising in a wheelchair, make sure to lock the wheels. Ask your health care provider whether you can do tai chi or yoga. Many positions in these mind-body exercises can be modified to do while seated. Warm-up Before starting other exercises: Sit up as straight as you can. Have your knees bent at 90 degrees, which is the shape of the capital letter "L." Keep your feet flat on the floor. Sit at the front edge of your chair, if you can. Pull in (tighten) the muscles in your abdomen and stretch your spine and neck as straight as you can. Hold this position for a few minutes. Breathe in and out evenly. Try to concentrate on your  breathing, and relax your mind. Stretching Exercise A: Arm stretch Hold your arms out straight in front of your body. Bend your hands at the wrist with your fingers pointing up, as if signaling someone to stop. Notice the slight tension in your forearms as you hold the position. Keeping your arms out and your hands bent, rotate your hands outward as far as you can and hold this stretch. Aim to have your thumbs pointing up and your pinkie fingers pointing down. Slowly repeat arm stretches for one minute as tolerated. Exercise B: Leg stretch If you can move your legs, try to "draw" letters on the floor with the toes of your foot. Write your name with one foot. Write your name with the toes of your other foot. Slowly repeat the movements for one minute as tolerated. Exercise C: Reach for the sky Reach your hands as far over your head as you can to stretch your spine. Move your hands and arms as if you are climbing a rope. Slowly repeat the movements for one minute as tolerated. Range of motion exercises Exercise A: Shoulder roll Let your arms hang loosely at your sides. Lift just your shoulders up toward your ears, then let them relax back down. When your shoulders feel loose, rotate your shoulders in backward and forward circles. Do shoulder rolls slowly for one minute as tolerated. Exercise B: March in place As if you  are marching, pump your arms and lift your legs up and down. Lift your knees as high as you can. If you are unable to lift your knees, just pump your arms and move your ankles and feet up and down. March in place for one minute as tolerated. Exercise C: Seated jumping jacks Let your arms hang down straight. Keeping your arms straight, lift them up over your head. Aim to point your fingers to the ceiling. While you lift your arms, straighten your legs and slide your heels along the floor to your sides, as wide as you can. As you bring your arms back down to your sides, slide  your legs back together. If you are unable to use your legs, just move your arms. Slowly repeat seated jumping jacks for one minute as tolerated. Strengthening exercises Exercise A: Shoulder squeeze Hold your arms straight out from your body to your sides, with your elbows bent and your fists pointed at the ceiling. Keeping your arms in the bent position, move them forward so your elbows and forearms meet in front of your face. Open your arms back out as wide as you can with your elbows still bent, until you feel your shoulder blades squeezing together. Hold for 5 seconds. Slowly repeat the movements forward and backward for one minute as tolerated. Contact a health care provider if you: Had to stop exercising due to any of the following: Pain. Nausea. Shortness of breath. Dizziness. Fatigue. Have significant pain or soreness after exercising. Get help right away if you have: Chest pain. Difficulty breathing. These symptoms may represent a serious problem that is an emergency. Do not wait to see if the symptoms will go away. Get medical help right away. Call your local emergency services (911 in the [place] Do not drive yourself to the hospital. This information is not intended to replace advice given to you by your health care provider. Make sure you discuss any questions you have with your healthcare provider. Document Revised: 09/11/2019 Document Reviewed: 09/28/2019 Elsevier Patient Education  2022 [place].   Ileus  Ileus is a condition that happens when the intestines, which are also called bowels, stop working correctly. The intestines are hollow organs that digest food after the food leaves the stomach. These organs are long, muscular tubes that connect the stomach to the rectum. When ileus occurs, the muscular contractions that cause food to move through the intestines do not happen asthey normally would. If the intestines stop working, food cannot pass through to get  digested. This condition is a serious problem that usually requires hospitalization. It can cause symptoms such as nausea, abdominal pain, and bloating. Ileus can lastfrom a few hours to a few days. What are the causes? This condition may be caused by: Surgery on the abdomen. An infection or inflammation in the abdomen. This includes inflammation of the lining of the abdomen (peritonitis). Infection or inflammation in other parts of the body, such as pneumonia or pancreatitis. Passage of gallstones or kidney stones. Damage to the nerves or blood vessels that go to the intestines. A collection of blood within the abdominal cavity. Imbalance in the salts in the blood (electrolytes). Injury to the brain or spinal cord. Medicines. Many medicines, including strong pain medicines, can cause ileus or make it worse. If the intestines stop working because of a blockage, that is a differentcondition that is called a bowel obstruction. What are the signs or symptoms? Symptoms of this condition include: Bloating of the abdomen. Pain  or discomfort in the abdomen. Poor appetite. Nausea and vomiting. Lack of normal bowel sounds, such as "growling" in the stomach. How is this diagnosed? This condition may be diagnosed with: A physical exam and medical history. X-rays or a CT scan of the abdomen. You may also have other tests to help find the cause of the condition. How is this treated? This condition may be treated by: Resting the intestines until they start to work again. This is often done by: Stopping oral intake of food and drink. You will be given fluid through an IV to prevent dehydration. Placing a small tube (nasogastric tube or NG tube) that is passed through your nose and into your stomach. The tube is attached to a suction device and keeps the stomach emptied out. This allows the bowels to rest and helps to reduce nausea and vomiting. Correcting any electrolyte imbalance by giving  supplements in the IV fluid. Stopping any medicines that might make ileus worse. Treating any condition that may have caused ileus. Follow these instructions at home: Eating and drinking  Follow instructions from your health care provider about: What to eat and drink. You may be told to start eating a bland diet. Over time, you may slowly resume a more normal, healthy diet. How much to eat and drink. You should eat small meals often and stop eating when you feel full. Avoid alcohol.  General instructions Take over-the-counter and prescription medicines only as told by your health care provider. Rest as told by your health care provider. Avoid sitting for a long time without moving. Get up to take short walks every 1-2 hours. Ask for help if you feel weak or unsteady. Keep all follow-up visits as told by your health care provider. This is important. Contact a health care provider if: You have nausea, vomiting, or abdominal discomfort. You have a fever. Get help right away if: You have severe abdominal pain or bloating. You cannot eat or drink without vomiting. Summary Ileus is a condition that happens when the intestines, which are also called bowels, stop working correctly. When ileus occurs, the muscular contractions that cause food to move through the intestines do not happen as they normally would. Ileus can cause symptoms such as nausea, abdominal pain, and bloating. Treatment may involve getting IV fluids and having a nasogastric tube placed to keep your stomach emptied out until the intestines start working again. This information is not intended to replace advice given to you by your health care provider. Make sure you discuss any questions you have with your healthcare provider. Document Revised: 09/27/2017 Document Reviewed: 09/27/2017 Elsevier Patient Education  [place].

## 2020-11-29 LAB — COMPREHENSIVE METABOLIC PANEL
ALT: 12 IU/L (ref 0–32)
AST: 13 IU/L (ref 0–40)
Albumin/Globulin Ratio: 1.6 (ref 1.2–2.2)
Albumin: 3.9 g/dL (ref 3.7–4.7)
Alkaline Phosphatase: 90 IU/L (ref 44–121)
BUN/Creatinine Ratio: 9 — ABNORMAL LOW (ref 12–28)
BUN: 4 mg/dL — ABNORMAL LOW (ref 8–27)
Bilirubin Total: 0.4 mg/dL (ref 0.0–1.2)
CO2: 32 mmol/L — ABNORMAL HIGH (ref 20–29)
Calcium: 9.5 mg/dL (ref 8.7–10.3)
Chloride: 97 mmol/L (ref 96–106)
Creatinine, Ser: 0.47 mg/dL — ABNORMAL LOW (ref 0.57–1.00)
Globulin, Total: 2.4 g/dL (ref 1.5–4.5)
Glucose: 101 mg/dL — ABNORMAL HIGH (ref 65–99)
Potassium: 4.5 mmol/L (ref 3.5–5.2)
Sodium: 143 mmol/L (ref 134–144)
Total Protein: 6.3 g/dL (ref 6.0–8.5)
eGFR: 97 mL/min/{1.73_m2} (ref >59)

## 2020-11-29 LAB — CBC WITH DIFFERENTIAL/PLATELET
Basophils Absolute: 0.1 10*3/uL (ref 0.0–0.2)
Basos: 1 %
EOS (ABSOLUTE): 0.6 10*3/uL — ABNORMAL HIGH (ref 0.0–0.4)
Eos: 7 %
Hematocrit: 38.9 % (ref 34.0–46.6)
Hemoglobin: 12.1 g/dL (ref 11.1–15.9)
Immature Grans (Abs): 0 10*3/uL (ref 0.0–0.1)
Immature Granulocytes: 0 %
Lymphocytes Absolute: 1.2 10*3/uL (ref 0.7–3.1)
Lymphs: 13 %
MCH: 29.4 pg (ref 26.6–33.0)
MCHC: 31.1 g/dL — ABNORMAL LOW (ref 31.5–35.7)
MCV: 94 fL (ref 79–97)
Monocytes Absolute: 0.8 10*3/uL (ref 0.1–0.9)
Monocytes: 9 %
Neutrophils Absolute: 6.7 10*3/uL (ref 1.4–7.0)
Neutrophils: 70 %
Platelets: 217 10*3/uL (ref 150–450)
RBC: 4.12 x10E6/uL (ref 3.77–5.28)
RDW: 12.9 % (ref 11.7–15.4)
WBC: 9.4 10*3/uL (ref 3.4–10.8)

## 2020-11-29 LAB — LIPID PANEL
Chol/HDL Ratio: 2.9 ratio (ref 0.0–4.4)
Cholesterol, Total: 151 mg/dL (ref 100–199)
HDL: 52 mg/dL (ref >39)
LDL Chol Calc (NIH): 81 mg/dL (ref 0–99)
Triglycerides: 100 mg/dL (ref 0–149)
VLDL Cholesterol Cal: 18 mg/dL (ref 5–40)

## 2020-11-29 LAB — CARDIOVASCULAR RISK ASSESSMENT

## 2020-12-02 DIAGNOSIS — I482 Chronic atrial fibrillation, unspecified: Secondary | ICD-10-CM | POA: Diagnosis not present

## 2020-12-02 DIAGNOSIS — K567 Ileus, unspecified: Secondary | ICD-10-CM | POA: Diagnosis not present

## 2020-12-02 DIAGNOSIS — D692 Other nonthrombocytopenic purpura: Secondary | ICD-10-CM | POA: Diagnosis not present

## 2020-12-02 DIAGNOSIS — J690 Pneumonitis due to inhalation of food and vomit: Secondary | ICD-10-CM | POA: Diagnosis not present

## 2020-12-02 DIAGNOSIS — I10 Essential (primary) hypertension: Secondary | ICD-10-CM | POA: Diagnosis not present

## 2020-12-02 DIAGNOSIS — Z9981 Dependence on supplemental oxygen: Secondary | ICD-10-CM | POA: Diagnosis not present

## 2020-12-02 DIAGNOSIS — K219 Gastro-esophageal reflux disease without esophagitis: Secondary | ICD-10-CM | POA: Diagnosis not present

## 2020-12-02 DIAGNOSIS — K56600 Partial intestinal obstruction, unspecified as to cause: Secondary | ICD-10-CM | POA: Diagnosis not present

## 2020-12-02 DIAGNOSIS — K8050 Calculus of bile duct without cholangitis or cholecystitis without obstruction: Secondary | ICD-10-CM | POA: Diagnosis not present

## 2020-12-02 DIAGNOSIS — J449 Chronic obstructive pulmonary disease, unspecified: Secondary | ICD-10-CM | POA: Diagnosis not present

## 2020-12-02 DIAGNOSIS — R1311 Dysphagia, oral phase: Secondary | ICD-10-CM | POA: Diagnosis not present

## 2020-12-02 DIAGNOSIS — R339 Retention of urine, unspecified: Secondary | ICD-10-CM | POA: Diagnosis not present

## 2020-12-02 DIAGNOSIS — J9610 Chronic respiratory failure, unspecified whether with hypoxia or hypercapnia: Secondary | ICD-10-CM | POA: Diagnosis not present

## 2020-12-04 ENCOUNTER — Other Ambulatory Visit: Payer: Self-pay | Admitting: Family Medicine

## 2020-12-04 DIAGNOSIS — D692 Other nonthrombocytopenic purpura: Secondary | ICD-10-CM | POA: Diagnosis not present

## 2020-12-04 DIAGNOSIS — R339 Retention of urine, unspecified: Secondary | ICD-10-CM | POA: Diagnosis not present

## 2020-12-04 DIAGNOSIS — K56600 Partial intestinal obstruction, unspecified as to cause: Secondary | ICD-10-CM | POA: Diagnosis not present

## 2020-12-04 DIAGNOSIS — I482 Chronic atrial fibrillation, unspecified: Secondary | ICD-10-CM | POA: Diagnosis not present

## 2020-12-04 DIAGNOSIS — R1311 Dysphagia, oral phase: Secondary | ICD-10-CM | POA: Diagnosis not present

## 2020-12-04 DIAGNOSIS — J690 Pneumonitis due to inhalation of food and vomit: Secondary | ICD-10-CM | POA: Diagnosis not present

## 2020-12-04 DIAGNOSIS — K8050 Calculus of bile duct without cholangitis or cholecystitis without obstruction: Secondary | ICD-10-CM | POA: Diagnosis not present

## 2020-12-04 DIAGNOSIS — J449 Chronic obstructive pulmonary disease, unspecified: Secondary | ICD-10-CM | POA: Diagnosis not present

## 2020-12-04 DIAGNOSIS — K567 Ileus, unspecified: Secondary | ICD-10-CM | POA: Diagnosis not present

## 2020-12-04 DIAGNOSIS — I10 Essential (primary) hypertension: Secondary | ICD-10-CM | POA: Diagnosis not present

## 2020-12-04 DIAGNOSIS — J9610 Chronic respiratory failure, unspecified whether with hypoxia or hypercapnia: Secondary | ICD-10-CM | POA: Diagnosis not present

## 2020-12-04 DIAGNOSIS — K219 Gastro-esophageal reflux disease without esophagitis: Secondary | ICD-10-CM | POA: Diagnosis not present

## 2020-12-04 DIAGNOSIS — Z9981 Dependence on supplemental oxygen: Secondary | ICD-10-CM | POA: Diagnosis not present

## 2020-12-05 DIAGNOSIS — R339 Retention of urine, unspecified: Secondary | ICD-10-CM | POA: Diagnosis not present

## 2020-12-05 DIAGNOSIS — K567 Ileus, unspecified: Secondary | ICD-10-CM | POA: Diagnosis not present

## 2020-12-05 DIAGNOSIS — I482 Chronic atrial fibrillation, unspecified: Secondary | ICD-10-CM | POA: Diagnosis not present

## 2020-12-05 DIAGNOSIS — K219 Gastro-esophageal reflux disease without esophagitis: Secondary | ICD-10-CM | POA: Diagnosis not present

## 2020-12-05 DIAGNOSIS — J9610 Chronic respiratory failure, unspecified whether with hypoxia or hypercapnia: Secondary | ICD-10-CM | POA: Diagnosis not present

## 2020-12-05 DIAGNOSIS — R1311 Dysphagia, oral phase: Secondary | ICD-10-CM | POA: Diagnosis not present

## 2020-12-05 DIAGNOSIS — K8050 Calculus of bile duct without cholangitis or cholecystitis without obstruction: Secondary | ICD-10-CM | POA: Diagnosis not present

## 2020-12-05 DIAGNOSIS — Z9981 Dependence on supplemental oxygen: Secondary | ICD-10-CM | POA: Diagnosis not present

## 2020-12-05 DIAGNOSIS — I10 Essential (primary) hypertension: Secondary | ICD-10-CM | POA: Diagnosis not present

## 2020-12-05 DIAGNOSIS — D692 Other nonthrombocytopenic purpura: Secondary | ICD-10-CM | POA: Diagnosis not present

## 2020-12-05 DIAGNOSIS — K56600 Partial intestinal obstruction, unspecified as to cause: Secondary | ICD-10-CM | POA: Diagnosis not present

## 2020-12-05 DIAGNOSIS — J690 Pneumonitis due to inhalation of food and vomit: Secondary | ICD-10-CM | POA: Diagnosis not present

## 2020-12-05 DIAGNOSIS — J449 Chronic obstructive pulmonary disease, unspecified: Secondary | ICD-10-CM | POA: Diagnosis not present

## 2020-12-09 DIAGNOSIS — Z9981 Dependence on supplemental oxygen: Secondary | ICD-10-CM | POA: Diagnosis not present

## 2020-12-09 DIAGNOSIS — K219 Gastro-esophageal reflux disease without esophagitis: Secondary | ICD-10-CM | POA: Diagnosis not present

## 2020-12-09 DIAGNOSIS — J690 Pneumonitis due to inhalation of food and vomit: Secondary | ICD-10-CM | POA: Diagnosis not present

## 2020-12-09 DIAGNOSIS — K567 Ileus, unspecified: Secondary | ICD-10-CM | POA: Diagnosis not present

## 2020-12-09 DIAGNOSIS — K8050 Calculus of bile duct without cholangitis or cholecystitis without obstruction: Secondary | ICD-10-CM | POA: Diagnosis not present

## 2020-12-09 DIAGNOSIS — I10 Essential (primary) hypertension: Secondary | ICD-10-CM | POA: Diagnosis not present

## 2020-12-09 DIAGNOSIS — R1311 Dysphagia, oral phase: Secondary | ICD-10-CM | POA: Diagnosis not present

## 2020-12-09 DIAGNOSIS — K56600 Partial intestinal obstruction, unspecified as to cause: Secondary | ICD-10-CM | POA: Diagnosis not present

## 2020-12-09 DIAGNOSIS — R339 Retention of urine, unspecified: Secondary | ICD-10-CM | POA: Diagnosis not present

## 2020-12-09 DIAGNOSIS — D692 Other nonthrombocytopenic purpura: Secondary | ICD-10-CM | POA: Diagnosis not present

## 2020-12-09 DIAGNOSIS — J449 Chronic obstructive pulmonary disease, unspecified: Secondary | ICD-10-CM | POA: Diagnosis not present

## 2020-12-09 DIAGNOSIS — J9610 Chronic respiratory failure, unspecified whether with hypoxia or hypercapnia: Secondary | ICD-10-CM | POA: Diagnosis not present

## 2020-12-09 DIAGNOSIS — I482 Chronic atrial fibrillation, unspecified: Secondary | ICD-10-CM | POA: Diagnosis not present

## 2020-12-10 ENCOUNTER — Telehealth: Payer: Self-pay

## 2020-12-10 NOTE — Telephone Encounter (Signed)
Tina Jimenez w/ [facility] calling for verbal orders for aide. Requesting aide once a week for remainder of cert period which is Aug 9th. Aide is to help bathe pt. Gave verbal orders for this schedule. Visit will not be completed next week due to holiday and pt is aware and agreeable.   [name], [place] 12/10/20 4:01 PM

## 2020-12-11 DIAGNOSIS — J9610 Chronic respiratory failure, unspecified whether with hypoxia or hypercapnia: Secondary | ICD-10-CM | POA: Diagnosis not present

## 2020-12-11 DIAGNOSIS — D692 Other nonthrombocytopenic purpura: Secondary | ICD-10-CM | POA: Diagnosis not present

## 2020-12-11 DIAGNOSIS — R1311 Dysphagia, oral phase: Secondary | ICD-10-CM | POA: Diagnosis not present

## 2020-12-11 DIAGNOSIS — J690 Pneumonitis due to inhalation of food and vomit: Secondary | ICD-10-CM | POA: Diagnosis not present

## 2020-12-11 DIAGNOSIS — R339 Retention of urine, unspecified: Secondary | ICD-10-CM | POA: Diagnosis not present

## 2020-12-11 DIAGNOSIS — I10 Essential (primary) hypertension: Secondary | ICD-10-CM | POA: Diagnosis not present

## 2020-12-11 DIAGNOSIS — K56600 Partial intestinal obstruction, unspecified as to cause: Secondary | ICD-10-CM | POA: Diagnosis not present

## 2020-12-11 DIAGNOSIS — I482 Chronic atrial fibrillation, unspecified: Secondary | ICD-10-CM | POA: Diagnosis not present

## 2020-12-11 DIAGNOSIS — J449 Chronic obstructive pulmonary disease, unspecified: Secondary | ICD-10-CM | POA: Diagnosis not present

## 2020-12-11 DIAGNOSIS — Z9981 Dependence on supplemental oxygen: Secondary | ICD-10-CM | POA: Diagnosis not present

## 2020-12-11 DIAGNOSIS — K8050 Calculus of bile duct without cholangitis or cholecystitis without obstruction: Secondary | ICD-10-CM | POA: Diagnosis not present

## 2020-12-11 DIAGNOSIS — K567 Ileus, unspecified: Secondary | ICD-10-CM | POA: Diagnosis not present

## 2020-12-11 DIAGNOSIS — K219 Gastro-esophageal reflux disease without esophagitis: Secondary | ICD-10-CM | POA: Diagnosis not present

## 2020-12-12 DIAGNOSIS — J449 Chronic obstructive pulmonary disease, unspecified: Secondary | ICD-10-CM | POA: Diagnosis not present

## 2020-12-13 DIAGNOSIS — K567 Ileus, unspecified: Secondary | ICD-10-CM | POA: Diagnosis not present

## 2020-12-13 DIAGNOSIS — D692 Other nonthrombocytopenic purpura: Secondary | ICD-10-CM | POA: Diagnosis not present

## 2020-12-13 DIAGNOSIS — J9610 Chronic respiratory failure, unspecified whether with hypoxia or hypercapnia: Secondary | ICD-10-CM | POA: Diagnosis not present

## 2020-12-13 DIAGNOSIS — R1311 Dysphagia, oral phase: Secondary | ICD-10-CM | POA: Diagnosis not present

## 2020-12-13 DIAGNOSIS — K56600 Partial intestinal obstruction, unspecified as to cause: Secondary | ICD-10-CM | POA: Diagnosis not present

## 2020-12-13 DIAGNOSIS — I10 Essential (primary) hypertension: Secondary | ICD-10-CM | POA: Diagnosis not present

## 2020-12-13 DIAGNOSIS — I482 Chronic atrial fibrillation, unspecified: Secondary | ICD-10-CM | POA: Diagnosis not present

## 2020-12-13 DIAGNOSIS — K8050 Calculus of bile duct without cholangitis or cholecystitis without obstruction: Secondary | ICD-10-CM | POA: Diagnosis not present

## 2020-12-13 DIAGNOSIS — K219 Gastro-esophageal reflux disease without esophagitis: Secondary | ICD-10-CM | POA: Diagnosis not present

## 2020-12-13 DIAGNOSIS — J449 Chronic obstructive pulmonary disease, unspecified: Secondary | ICD-10-CM | POA: Diagnosis not present

## 2020-12-13 DIAGNOSIS — J690 Pneumonitis due to inhalation of food and vomit: Secondary | ICD-10-CM | POA: Diagnosis not present

## 2020-12-13 DIAGNOSIS — R339 Retention of urine, unspecified: Secondary | ICD-10-CM | POA: Diagnosis not present

## 2020-12-13 DIAGNOSIS — Z9981 Dependence on supplemental oxygen: Secondary | ICD-10-CM | POA: Diagnosis not present

## 2020-12-18 DIAGNOSIS — J9610 Chronic respiratory failure, unspecified whether with hypoxia or hypercapnia: Secondary | ICD-10-CM | POA: Diagnosis not present

## 2020-12-18 DIAGNOSIS — K56600 Partial intestinal obstruction, unspecified as to cause: Secondary | ICD-10-CM | POA: Diagnosis not present

## 2020-12-18 DIAGNOSIS — K219 Gastro-esophageal reflux disease without esophagitis: Secondary | ICD-10-CM | POA: Diagnosis not present

## 2020-12-18 DIAGNOSIS — K567 Ileus, unspecified: Secondary | ICD-10-CM | POA: Diagnosis not present

## 2020-12-18 DIAGNOSIS — J449 Chronic obstructive pulmonary disease, unspecified: Secondary | ICD-10-CM | POA: Diagnosis not present

## 2020-12-18 DIAGNOSIS — Z9981 Dependence on supplemental oxygen: Secondary | ICD-10-CM | POA: Diagnosis not present

## 2020-12-18 DIAGNOSIS — K8050 Calculus of bile duct without cholangitis or cholecystitis without obstruction: Secondary | ICD-10-CM | POA: Diagnosis not present

## 2020-12-18 DIAGNOSIS — I10 Essential (primary) hypertension: Secondary | ICD-10-CM | POA: Diagnosis not present

## 2020-12-18 DIAGNOSIS — I482 Chronic atrial fibrillation, unspecified: Secondary | ICD-10-CM | POA: Diagnosis not present

## 2020-12-18 DIAGNOSIS — D692 Other nonthrombocytopenic purpura: Secondary | ICD-10-CM | POA: Diagnosis not present

## 2020-12-18 DIAGNOSIS — R1311 Dysphagia, oral phase: Secondary | ICD-10-CM | POA: Diagnosis not present

## 2020-12-18 DIAGNOSIS — J690 Pneumonitis due to inhalation of food and vomit: Secondary | ICD-10-CM | POA: Diagnosis not present

## 2020-12-18 DIAGNOSIS — R339 Retention of urine, unspecified: Secondary | ICD-10-CM | POA: Diagnosis not present

## 2020-12-20 DIAGNOSIS — J9610 Chronic respiratory failure, unspecified whether with hypoxia or hypercapnia: Secondary | ICD-10-CM | POA: Diagnosis not present

## 2020-12-20 DIAGNOSIS — K8050 Calculus of bile duct without cholangitis or cholecystitis without obstruction: Secondary | ICD-10-CM | POA: Diagnosis not present

## 2020-12-20 DIAGNOSIS — J449 Chronic obstructive pulmonary disease, unspecified: Secondary | ICD-10-CM | POA: Diagnosis not present

## 2020-12-20 DIAGNOSIS — Z9981 Dependence on supplemental oxygen: Secondary | ICD-10-CM | POA: Diagnosis not present

## 2020-12-20 DIAGNOSIS — K567 Ileus, unspecified: Secondary | ICD-10-CM | POA: Diagnosis not present

## 2020-12-20 DIAGNOSIS — R1311 Dysphagia, oral phase: Secondary | ICD-10-CM | POA: Diagnosis not present

## 2020-12-20 DIAGNOSIS — K219 Gastro-esophageal reflux disease without esophagitis: Secondary | ICD-10-CM | POA: Diagnosis not present

## 2020-12-20 DIAGNOSIS — I482 Chronic atrial fibrillation, unspecified: Secondary | ICD-10-CM | POA: Diagnosis not present

## 2020-12-20 DIAGNOSIS — J690 Pneumonitis due to inhalation of food and vomit: Secondary | ICD-10-CM | POA: Diagnosis not present

## 2020-12-20 DIAGNOSIS — K56600 Partial intestinal obstruction, unspecified as to cause: Secondary | ICD-10-CM | POA: Diagnosis not present

## 2020-12-20 DIAGNOSIS — D692 Other nonthrombocytopenic purpura: Secondary | ICD-10-CM | POA: Diagnosis not present

## 2020-12-20 DIAGNOSIS — R339 Retention of urine, unspecified: Secondary | ICD-10-CM | POA: Diagnosis not present

## 2020-12-20 DIAGNOSIS — I10 Essential (primary) hypertension: Secondary | ICD-10-CM | POA: Diagnosis not present

## 2020-12-23 DIAGNOSIS — J449 Chronic obstructive pulmonary disease, unspecified: Secondary | ICD-10-CM | POA: Diagnosis not present

## 2020-12-23 DIAGNOSIS — K8050 Calculus of bile duct without cholangitis or cholecystitis without obstruction: Secondary | ICD-10-CM | POA: Diagnosis not present

## 2020-12-23 DIAGNOSIS — D692 Other nonthrombocytopenic purpura: Secondary | ICD-10-CM | POA: Diagnosis not present

## 2020-12-23 DIAGNOSIS — Z9981 Dependence on supplemental oxygen: Secondary | ICD-10-CM | POA: Diagnosis not present

## 2020-12-23 DIAGNOSIS — J690 Pneumonitis due to inhalation of food and vomit: Secondary | ICD-10-CM | POA: Diagnosis not present

## 2020-12-23 DIAGNOSIS — J9610 Chronic respiratory failure, unspecified whether with hypoxia or hypercapnia: Secondary | ICD-10-CM | POA: Diagnosis not present

## 2020-12-23 DIAGNOSIS — K567 Ileus, unspecified: Secondary | ICD-10-CM | POA: Diagnosis not present

## 2020-12-23 DIAGNOSIS — I482 Chronic atrial fibrillation, unspecified: Secondary | ICD-10-CM | POA: Diagnosis not present

## 2020-12-23 DIAGNOSIS — K219 Gastro-esophageal reflux disease without esophagitis: Secondary | ICD-10-CM | POA: Diagnosis not present

## 2020-12-23 DIAGNOSIS — R339 Retention of urine, unspecified: Secondary | ICD-10-CM | POA: Diagnosis not present

## 2020-12-23 DIAGNOSIS — I10 Essential (primary) hypertension: Secondary | ICD-10-CM | POA: Diagnosis not present

## 2020-12-23 DIAGNOSIS — R1311 Dysphagia, oral phase: Secondary | ICD-10-CM | POA: Diagnosis not present

## 2020-12-23 DIAGNOSIS — K56600 Partial intestinal obstruction, unspecified as to cause: Secondary | ICD-10-CM | POA: Diagnosis not present

## 2020-12-25 DIAGNOSIS — J449 Chronic obstructive pulmonary disease, unspecified: Secondary | ICD-10-CM | POA: Diagnosis not present

## 2020-12-25 DIAGNOSIS — K567 Ileus, unspecified: Secondary | ICD-10-CM | POA: Diagnosis not present

## 2020-12-25 DIAGNOSIS — K8050 Calculus of bile duct without cholangitis or cholecystitis without obstruction: Secondary | ICD-10-CM | POA: Diagnosis not present

## 2020-12-25 DIAGNOSIS — K219 Gastro-esophageal reflux disease without esophagitis: Secondary | ICD-10-CM | POA: Diagnosis not present

## 2020-12-25 DIAGNOSIS — D692 Other nonthrombocytopenic purpura: Secondary | ICD-10-CM | POA: Diagnosis not present

## 2020-12-25 DIAGNOSIS — J690 Pneumonitis due to inhalation of food and vomit: Secondary | ICD-10-CM | POA: Diagnosis not present

## 2020-12-25 DIAGNOSIS — Z9981 Dependence on supplemental oxygen: Secondary | ICD-10-CM | POA: Diagnosis not present

## 2020-12-25 DIAGNOSIS — I10 Essential (primary) hypertension: Secondary | ICD-10-CM | POA: Diagnosis not present

## 2020-12-25 DIAGNOSIS — K56600 Partial intestinal obstruction, unspecified as to cause: Secondary | ICD-10-CM | POA: Diagnosis not present

## 2020-12-25 DIAGNOSIS — I482 Chronic atrial fibrillation, unspecified: Secondary | ICD-10-CM | POA: Diagnosis not present

## 2020-12-25 DIAGNOSIS — R339 Retention of urine, unspecified: Secondary | ICD-10-CM | POA: Diagnosis not present

## 2020-12-25 DIAGNOSIS — J9610 Chronic respiratory failure, unspecified whether with hypoxia or hypercapnia: Secondary | ICD-10-CM | POA: Diagnosis not present

## 2020-12-25 DIAGNOSIS — R1311 Dysphagia, oral phase: Secondary | ICD-10-CM | POA: Diagnosis not present

## 2020-12-31 ENCOUNTER — Other Ambulatory Visit: Payer: Self-pay | Admitting: Physician Assistant

## 2021-01-01 DIAGNOSIS — J690 Pneumonitis due to inhalation of food and vomit: Secondary | ICD-10-CM | POA: Diagnosis not present

## 2021-01-01 DIAGNOSIS — K56600 Partial intestinal obstruction, unspecified as to cause: Secondary | ICD-10-CM | POA: Diagnosis not present

## 2021-01-01 DIAGNOSIS — J449 Chronic obstructive pulmonary disease, unspecified: Secondary | ICD-10-CM | POA: Diagnosis not present

## 2021-01-01 DIAGNOSIS — R339 Retention of urine, unspecified: Secondary | ICD-10-CM | POA: Diagnosis not present

## 2021-01-01 DIAGNOSIS — J9610 Chronic respiratory failure, unspecified whether with hypoxia or hypercapnia: Secondary | ICD-10-CM | POA: Diagnosis not present

## 2021-01-01 DIAGNOSIS — K8050 Calculus of bile duct without cholangitis or cholecystitis without obstruction: Secondary | ICD-10-CM | POA: Diagnosis not present

## 2021-01-01 DIAGNOSIS — I10 Essential (primary) hypertension: Secondary | ICD-10-CM | POA: Diagnosis not present

## 2021-01-01 DIAGNOSIS — K567 Ileus, unspecified: Secondary | ICD-10-CM | POA: Diagnosis not present

## 2021-01-01 DIAGNOSIS — I482 Chronic atrial fibrillation, unspecified: Secondary | ICD-10-CM | POA: Diagnosis not present

## 2021-01-01 DIAGNOSIS — K219 Gastro-esophageal reflux disease without esophagitis: Secondary | ICD-10-CM | POA: Diagnosis not present

## 2021-01-01 DIAGNOSIS — Z9981 Dependence on supplemental oxygen: Secondary | ICD-10-CM | POA: Diagnosis not present

## 2021-01-01 DIAGNOSIS — R1311 Dysphagia, oral phase: Secondary | ICD-10-CM | POA: Diagnosis not present

## 2021-01-01 DIAGNOSIS — D692 Other nonthrombocytopenic purpura: Secondary | ICD-10-CM | POA: Diagnosis not present

## 2021-01-02 DIAGNOSIS — K56600 Partial intestinal obstruction, unspecified as to cause: Secondary | ICD-10-CM | POA: Diagnosis not present

## 2021-01-02 DIAGNOSIS — J690 Pneumonitis due to inhalation of food and vomit: Secondary | ICD-10-CM | POA: Diagnosis not present

## 2021-01-02 DIAGNOSIS — K8050 Calculus of bile duct without cholangitis or cholecystitis without obstruction: Secondary | ICD-10-CM | POA: Diagnosis not present

## 2021-01-02 DIAGNOSIS — D692 Other nonthrombocytopenic purpura: Secondary | ICD-10-CM | POA: Diagnosis not present

## 2021-01-02 DIAGNOSIS — I482 Chronic atrial fibrillation, unspecified: Secondary | ICD-10-CM | POA: Diagnosis not present

## 2021-01-02 DIAGNOSIS — J449 Chronic obstructive pulmonary disease, unspecified: Secondary | ICD-10-CM | POA: Diagnosis not present

## 2021-01-02 DIAGNOSIS — R339 Retention of urine, unspecified: Secondary | ICD-10-CM | POA: Diagnosis not present

## 2021-01-02 DIAGNOSIS — J9610 Chronic respiratory failure, unspecified whether with hypoxia or hypercapnia: Secondary | ICD-10-CM | POA: Diagnosis not present

## 2021-01-02 DIAGNOSIS — Z9981 Dependence on supplemental oxygen: Secondary | ICD-10-CM | POA: Diagnosis not present

## 2021-01-02 DIAGNOSIS — K219 Gastro-esophageal reflux disease without esophagitis: Secondary | ICD-10-CM | POA: Diagnosis not present

## 2021-01-02 DIAGNOSIS — K567 Ileus, unspecified: Secondary | ICD-10-CM | POA: Diagnosis not present

## 2021-01-02 DIAGNOSIS — I10 Essential (primary) hypertension: Secondary | ICD-10-CM | POA: Diagnosis not present

## 2021-01-02 DIAGNOSIS — R1311 Dysphagia, oral phase: Secondary | ICD-10-CM | POA: Diagnosis not present

## 2021-01-03 ENCOUNTER — Other Ambulatory Visit: Payer: Self-pay

## 2021-01-03 ENCOUNTER — Ambulatory Visit (INDEPENDENT_AMBULATORY_CARE_PROVIDER_SITE_OTHER): Payer: Medicare Other | Admitting: Cardiology

## 2021-01-03 ENCOUNTER — Encounter: Payer: Self-pay | Admitting: Cardiology

## 2021-01-03 VITALS — BP 152/101 | HR 97 | Ht 63.0 in | Wt 137.0 lb

## 2021-01-03 DIAGNOSIS — I480 Paroxysmal atrial fibrillation: Secondary | ICD-10-CM | POA: Diagnosis not present

## 2021-01-03 DIAGNOSIS — E782 Mixed hyperlipidemia: Secondary | ICD-10-CM

## 2021-01-03 DIAGNOSIS — I2510 Atherosclerotic heart disease of native coronary artery without angina pectoris: Secondary | ICD-10-CM

## 2021-01-03 NOTE — Progress Notes (Signed)
Cardiology Office Note:    Date:  01/03/2021   ID:  JORI JAMIL, DOB Feb 05, 1941, MRN [id number]  PCP:  Marge Duncans, PA-C  Cardiologist:  Jenean Lindau, MD   Referring MD: Marge Duncans, PA-C    ASSESSMENT:    1. Paroxysmal atrial fibrillation (HCC)   2. Coronary artery calcification seen on CT scan   3. Mixed dyslipidemia    PLAN:    In order of problems listed above:  Coronary artery calcification: Secondary prevention stressed.  Importance of compliance with diet medication stressed and she vocalized understanding I reviewed her lipids with her.  She is happy about it.   Appointment atrial fibrillation: Stable at this time and asymptomatic.  Medical management.  Not a candidate for anticoagulation because of fall potential. Mixed dyslipidemia: Diet was emphasized lipids were reviewed.  She is taking her medications very correctly. COPD on supplemental oxygen: Stable and managed by primary care provider team. Patient will be seen in follow-up appointment in 6 months or earlier if the patient has any concerns    Medication Adjustments/Labs and Tests Ordered: Current medicines are reviewed at length with the patient today.  Concerns regarding medicines are outlined above.  No orders of the defined types were placed in this encounter.  No orders of the defined types were placed in this encounter.    No chief complaint on file.    History of Present Illness:    Tina Jimenez is a 80 y.o. female.  Patient has past medical history of coronary artery calcifications, COPD on supplemental oxygen dependent, atrial fibrillation.  She denies any problems at this time an essentially brought in by her son in a wheelchair.  She denies any chest pain orthopnea or PND.  She is very frail.  At the time of my evaluation, the patient is alert awake oriented and in no distress.  Past Medical History:  Diagnosis Date   Abdominal pain 10/13/2019   Abnormal blood chemistry  08/22/2019   Acute blood loss anemia 06/22/2019   Acute bronchitis with COPD ([place]) 08/15/2019   Acute cystitis with hematuria 10/26/2019   Acute hypoxemic respiratory failure ([place]) 07/08/2019   Acute pain of left shoulder 10/26/2019   Acute pulmonary embolism without acute cor pulmonale ([place]) 06/21/2019   Adnexal mass    Anemia 01/08/2020   Arm DVT (deep venous thromboembolism), acute, left (HCC) 06/26/2019   Atrial fibrillation ([place])    Bladder mass 10/26/2019   Compression fracture of body of thoracic vertebra ([place]) 2020   COPD (chronic obstructive pulmonary disease) (HCC)    Coronary artery calcification seen on CT scan 05/23/2020   Dependence on supplemental oxygen 04/14/2020   GERD (gastroesophageal reflux disease)    History of pulmonary embolism 10/02/2019   Hyperlipemia    Hypertension 08/15/2019   Localized edema 08/22/2019   Mixed dyslipidemia 05/23/2020   Osteoporosis    Other fatigue 01/08/2020   Oxygen deficiency    Paroxysmal atrial fibrillation ([place]) 06/20/2019   Persistent atrial fibrillation ([place]) 08/22/2019   Pneumonia 05/2019   COVID Pneumonia   Pneumonia due to COVID-19 virus 07/08/2019   Precordial pain 06/20/2019   Pulmonary embolism ([place]) 05/2019   Spinal stenosis of lumbar region 06/14/2019   Vitamin D deficiency 06/22/2019    Past Surgical History:  Procedure Laterality Date   APPENDECTOMY     KYPHOPLASTY      Current Medications: Current Meds  Medication Sig   Ascorbic Acid (VITAMIN C) 500 MG CAPS Take 1  tablet by mouth daily.   aspirin EC 81 MG tablet Take 81 mg by mouth every other day. Swallow whole.   aspirin-acetaminophen-caffeine (EXCEDRIN MIGRAINE) 250-250-65 MG tablet Take 1-2 tablets by mouth every 6 (six) hours as needed for headache.   Bempedoic Acid-Ezetimibe (NEXLIZET) 180-10 MG TABS Take 1 tablet by mouth daily in the afternoon.   BREO ELLIPTA 100-25 MCG/INH AEPB INHALE 1 PUFF BY MOUTH EVERY DAY   carvedilol (COREG) 6.25 MG tablet TAKE 1 TABLET BY MOUTH  TWICE A DAY   Co-Enzyme Q-10 100 MG CAPS Take 100 mg by mouth daily.   digoxin (LANOXIN) 0.125 MG tablet Take 0.125 mg by mouth daily.   famotidine (PEPCID) 20 MG tablet Take 20 mg by mouth daily.   ferrous sulfate 325 (65 FE) MG tablet TAKE 1 TABLET BY MOUTH EVERY DAY WITH BREAKFAST   fluticasone (FLONASE) 50 MCG/ACT nasal spray Place 2 sprays into both nostrils daily.   ipratropium-albuterol (DUONEB) 0.5-2.5 (3) MG/3ML SOLN Take 3 mLs by nebulization 4 (four) times daily.   loratadine (CLARITIN) 10 MG tablet Take 10 mg by mouth daily.   omeprazole (PRILOSEC) 20 MG capsule Take 1 capsule (20 mg total) by mouth daily.   ondansetron (ZOFRAN) 4 MG tablet Take 4 mg by mouth every 8 (eight) hours as needed for nausea/vomiting.   polyethylene glycol (MIRALAX / GLYCOLAX) 17 g packet Take 17 g by mouth daily.     Allergies:   Multaq [dronedarone], Klonopin [clonazepam], Lipitor [atorvastatin], and Simvastatin   Social History   Socioeconomic History   Marital status: Widowed    Spouse name: Not on file   Number of children: 4   Years of education: Not on file   Highest education level: Not on file  Occupational History   Occupation: retired  Tobacco Use   Smoking status: Former    Types: Cigarettes    Quit date: 11/01/2018    Years since quitting: 2.1   Smokeless tobacco: Never  Vaping Use   Vaping Use: Never used  Substance and Sexual Activity   Alcohol use: Never   Drug use: Never   Sexual activity: Not on file  Other Topics Concern   Not on file  Social History Narrative   Not on file   Social Determinants of Health   Financial Resource Strain: Not on file  Food Insecurity: No Food Insecurity   Worried About Charity fundraiser in the Last Year: Never true   Ran Out of Food in the Last Year: Never true  Transportation Needs: Not on file  Physical Activity: Not on file  Stress: Not on file  Social Connections: Not on file     Family History: The patient's family  history includes Breast cancer in her sister; Lung cancer in her daughter and sister.  ROS:   Please see the history of present illness.    All other systems reviewed and are negative.  EKGs/Labs/Other Studies Reviewed:    The following studies were reviewed today: EKG was reviewed patient with nonspecific ST-T changes   Recent Labs: 11/28/2020: ALT 12; BUN 4; Creatinine, Ser 0.47; Hemoglobin 12.1; Platelets 217; Potassium 4.5; Sodium 143  Recent Lipid Panel    Component Value Date/Time   CHOL 151 11/28/2020 1558   TRIG 100 11/28/2020 1558   HDL 52 11/28/2020 1558   CHOLHDL 2.9 11/28/2020 1558   LDLCALC 81 11/28/2020 1558    Physical Exam:    VS:  BP (!) 152/101   Pulse 97  Ht '5\' 3"'$  (1.6 m)   Wt 137 lb (62.1 kg)   SpO2 98%   BMI 24.27 kg/m     Wt Readings from Last 3 Encounters:  01/03/21 137 lb (62.1 kg)  11/28/20 138 lb 9.6 oz (62.9 kg)  07/10/20 146 lb 9.6 oz (66.5 kg)     GEN: Patient is in no acute distress HEENT: Normal NECK: No JVD; No carotid bruits LYMPHATICS: No lymphadenopathy CARDIAC: Hear sounds regular, 2/6 systolic murmur at the apex. RESPIRATORY:  Clear to auscultation without rales, wheezing or rhonchi  ABDOMEN: Soft, non-tender, non-distended MUSCULOSKELETAL:  No edema; No deformity  SKIN: Warm and dry NEUROLOGIC:  Alert and oriented x 3 PSYCHIATRIC:  Normal affect   Signed, Jenean Lindau, MD  01/03/2021 3:59 PM    [facility]

## 2021-01-03 NOTE — Patient Instructions (Signed)

## 2021-01-08 DIAGNOSIS — K56600 Partial intestinal obstruction, unspecified as to cause: Secondary | ICD-10-CM | POA: Diagnosis not present

## 2021-01-08 DIAGNOSIS — R339 Retention of urine, unspecified: Secondary | ICD-10-CM | POA: Diagnosis not present

## 2021-01-08 DIAGNOSIS — D692 Other nonthrombocytopenic purpura: Secondary | ICD-10-CM | POA: Diagnosis not present

## 2021-01-08 DIAGNOSIS — J449 Chronic obstructive pulmonary disease, unspecified: Secondary | ICD-10-CM | POA: Diagnosis not present

## 2021-01-08 DIAGNOSIS — Z9981 Dependence on supplemental oxygen: Secondary | ICD-10-CM | POA: Diagnosis not present

## 2021-01-08 DIAGNOSIS — K8050 Calculus of bile duct without cholangitis or cholecystitis without obstruction: Secondary | ICD-10-CM | POA: Diagnosis not present

## 2021-01-08 DIAGNOSIS — I10 Essential (primary) hypertension: Secondary | ICD-10-CM | POA: Diagnosis not present

## 2021-01-08 DIAGNOSIS — R1311 Dysphagia, oral phase: Secondary | ICD-10-CM | POA: Diagnosis not present

## 2021-01-08 DIAGNOSIS — K219 Gastro-esophageal reflux disease without esophagitis: Secondary | ICD-10-CM | POA: Diagnosis not present

## 2021-01-08 DIAGNOSIS — J690 Pneumonitis due to inhalation of food and vomit: Secondary | ICD-10-CM | POA: Diagnosis not present

## 2021-01-08 DIAGNOSIS — I482 Chronic atrial fibrillation, unspecified: Secondary | ICD-10-CM | POA: Diagnosis not present

## 2021-01-08 DIAGNOSIS — K567 Ileus, unspecified: Secondary | ICD-10-CM | POA: Diagnosis not present

## 2021-01-08 DIAGNOSIS — J9610 Chronic respiratory failure, unspecified whether with hypoxia or hypercapnia: Secondary | ICD-10-CM | POA: Diagnosis not present

## 2021-01-12 DIAGNOSIS — J449 Chronic obstructive pulmonary disease, unspecified: Secondary | ICD-10-CM | POA: Diagnosis not present

## 2021-01-15 DIAGNOSIS — R1311 Dysphagia, oral phase: Secondary | ICD-10-CM | POA: Diagnosis not present

## 2021-01-15 DIAGNOSIS — J9610 Chronic respiratory failure, unspecified whether with hypoxia or hypercapnia: Secondary | ICD-10-CM | POA: Diagnosis not present

## 2021-01-15 DIAGNOSIS — K567 Ileus, unspecified: Secondary | ICD-10-CM | POA: Diagnosis not present

## 2021-01-15 DIAGNOSIS — D692 Other nonthrombocytopenic purpura: Secondary | ICD-10-CM | POA: Diagnosis not present

## 2021-01-15 DIAGNOSIS — I10 Essential (primary) hypertension: Secondary | ICD-10-CM | POA: Diagnosis not present

## 2021-01-15 DIAGNOSIS — J690 Pneumonitis due to inhalation of food and vomit: Secondary | ICD-10-CM | POA: Diagnosis not present

## 2021-01-15 DIAGNOSIS — K8050 Calculus of bile duct without cholangitis or cholecystitis without obstruction: Secondary | ICD-10-CM | POA: Diagnosis not present

## 2021-01-15 DIAGNOSIS — K219 Gastro-esophageal reflux disease without esophagitis: Secondary | ICD-10-CM | POA: Diagnosis not present

## 2021-01-15 DIAGNOSIS — R339 Retention of urine, unspecified: Secondary | ICD-10-CM | POA: Diagnosis not present

## 2021-01-15 DIAGNOSIS — Z9981 Dependence on supplemental oxygen: Secondary | ICD-10-CM | POA: Diagnosis not present

## 2021-01-15 DIAGNOSIS — J449 Chronic obstructive pulmonary disease, unspecified: Secondary | ICD-10-CM | POA: Diagnosis not present

## 2021-01-15 DIAGNOSIS — I482 Chronic atrial fibrillation, unspecified: Secondary | ICD-10-CM | POA: Diagnosis not present

## 2021-01-15 DIAGNOSIS — K56600 Partial intestinal obstruction, unspecified as to cause: Secondary | ICD-10-CM | POA: Diagnosis not present

## 2021-01-17 DIAGNOSIS — R1311 Dysphagia, oral phase: Secondary | ICD-10-CM | POA: Diagnosis not present

## 2021-01-17 DIAGNOSIS — J690 Pneumonitis due to inhalation of food and vomit: Secondary | ICD-10-CM | POA: Diagnosis not present

## 2021-01-17 DIAGNOSIS — R339 Retention of urine, unspecified: Secondary | ICD-10-CM | POA: Diagnosis not present

## 2021-01-17 DIAGNOSIS — K8050 Calculus of bile duct without cholangitis or cholecystitis without obstruction: Secondary | ICD-10-CM | POA: Diagnosis not present

## 2021-01-17 DIAGNOSIS — I10 Essential (primary) hypertension: Secondary | ICD-10-CM | POA: Diagnosis not present

## 2021-01-17 DIAGNOSIS — K219 Gastro-esophageal reflux disease without esophagitis: Secondary | ICD-10-CM | POA: Diagnosis not present

## 2021-01-17 DIAGNOSIS — D692 Other nonthrombocytopenic purpura: Secondary | ICD-10-CM | POA: Diagnosis not present

## 2021-01-17 DIAGNOSIS — Z9981 Dependence on supplemental oxygen: Secondary | ICD-10-CM | POA: Diagnosis not present

## 2021-01-17 DIAGNOSIS — I482 Chronic atrial fibrillation, unspecified: Secondary | ICD-10-CM | POA: Diagnosis not present

## 2021-01-17 DIAGNOSIS — K56600 Partial intestinal obstruction, unspecified as to cause: Secondary | ICD-10-CM | POA: Diagnosis not present

## 2021-01-17 DIAGNOSIS — K567 Ileus, unspecified: Secondary | ICD-10-CM | POA: Diagnosis not present

## 2021-01-17 DIAGNOSIS — J449 Chronic obstructive pulmonary disease, unspecified: Secondary | ICD-10-CM | POA: Diagnosis not present

## 2021-01-17 DIAGNOSIS — J9610 Chronic respiratory failure, unspecified whether with hypoxia or hypercapnia: Secondary | ICD-10-CM | POA: Diagnosis not present

## 2021-02-08 DIAGNOSIS — J449 Chronic obstructive pulmonary disease, unspecified: Secondary | ICD-10-CM | POA: Diagnosis not present

## 2021-02-10 ENCOUNTER — Telehealth: Payer: Self-pay

## 2021-02-10 NOTE — Chronic Care Management (AMB) (Signed)
Chronic Care Management Pharmacy Assistant   Name: Tina Jimenez  MRN: [id number] DOB: 02/21/1941   Reason for Encounter: Disease State/ COPD  Recent office visits:  11-28-2020 Rip Harbour, NP. STOP Alvert 10 mg and Claritin 10 mg daily. START Claritin 10 mg daily and Diflucan 150 mg take only 1 tablet. MCHC= 31.1, EOS (Absolute)= 0.6. Glucose= 101, BUN= 4, Creatinine= 0.47, BUN/Creatinine= 9, CO2= 32.  Recent consult visits:  01-03-2021 Revankar, Reita Cliche, MD (Cardiology). Patient reported not taking Colace 250 mg. EKG order placed.   Hospital visits:  Medication Reconciliation was completed by comparing discharge summary, patient's EMR and Pharmacy list, and upon discussion with patient.  Admitted to the hospital on 10-10-2020 due to Calculus of gallbladder. Discharge date was 10-10-2020. Unable to view encounter   Medications: Outpatient Encounter Medications as of 02/10/2021  Medication Sig   Ascorbic Acid (VITAMIN C) 500 MG CAPS Take 1 tablet by mouth daily.   aspirin EC 81 MG tablet Take 81 mg by mouth every other day. Swallow whole.   aspirin-acetaminophen-caffeine (EXCEDRIN MIGRAINE) 250-250-65 MG tablet Take 1-2 tablets by mouth every 6 (six) hours as needed for headache.   Bempedoic Acid-Ezetimibe (NEXLIZET) 180-10 MG TABS Take 1 tablet by mouth daily in the afternoon.   BREO ELLIPTA 100-25 MCG/INH AEPB INHALE 1 PUFF BY MOUTH EVERY DAY   carvedilol (COREG) 6.25 MG tablet TAKE 1 TABLET BY MOUTH TWICE A DAY   Co-Enzyme Q-10 100 MG CAPS Take 100 mg by mouth daily.   digoxin (LANOXIN) 0.125 MG tablet Take 0.125 mg by mouth daily.   famotidine (PEPCID) 20 MG tablet Take 20 mg by mouth daily.   ferrous sulfate 325 (65 FE) MG tablet TAKE 1 TABLET BY MOUTH EVERY DAY WITH BREAKFAST   fluticasone (FLONASE) 50 MCG/ACT nasal spray Place 2 sprays into both nostrils daily.   ipratropium-albuterol (DUONEB) 0.5-2.5 (3) MG/3ML SOLN Take 3 mLs by nebulization 4 (four) times  daily.   loratadine (CLARITIN) 10 MG tablet Take 10 mg by mouth daily.   omeprazole (PRILOSEC) 20 MG capsule Take 1 capsule (20 mg total) by mouth daily.   ondansetron (ZOFRAN) 4 MG tablet Take 4 mg by mouth every 8 (eight) hours as needed for nausea/vomiting.   polyethylene glycol (MIRALAX / GLYCOLAX) 17 g packet Take 17 g by mouth daily.   No facility-administered encounter medications on file as of 02/10/2021.   Current COPD regimen:  Breo Ellipta 100-25 mcg/inh 1 puff daily Ipratropium-albuterol .5-2.5/ use 1 nebule every morning, noon, evening and bedtime Claritin 10 mg daily  No flowsheet data found. Any recent hospitalizations or ED visits since last visit with CPP? Yes  Reports COPD symptoms, including Increased shortness of breath   What recent interventions/DTPs have been made by any provider to improve breathing since last visit: Counseled on Benefits of consistent maintenance inhaler use Recommended to continue current medication  Have you had exacerbation/flare-up since last visit? No  What do you do when you are short of breath?  Rest  Respiratory Devices/Equipment Do you have a nebulizer? Yes Do you use a Peak Flow Meter? No Do you use a maintenance inhaler? Yes How often do you forget to use your daily inhaler? Never Do you use a rescue inhaler? No Do you use a spacer with your inhaler? No  Adherence Review: Does the patient have >5 day gap between last estimated fill date for maintenance inhaler medications? No   Care Gaps: Tdap overdue Shingrix overdue Dexa  scan overdue PNA vac overdue Hep C screening overdue  Star Rating Drugs: None  Jeannette How [place] Clinical Pharmacist Assistant [phone]

## 2021-02-12 DIAGNOSIS — J449 Chronic obstructive pulmonary disease, unspecified: Secondary | ICD-10-CM | POA: Diagnosis not present

## 2021-02-19 ENCOUNTER — Ambulatory Visit (INDEPENDENT_AMBULATORY_CARE_PROVIDER_SITE_OTHER): Payer: Medicare Other

## 2021-02-19 DIAGNOSIS — I10 Essential (primary) hypertension: Secondary | ICD-10-CM

## 2021-02-19 DIAGNOSIS — E782 Mixed hyperlipidemia: Secondary | ICD-10-CM

## 2021-02-19 DIAGNOSIS — I480 Paroxysmal atrial fibrillation: Secondary | ICD-10-CM

## 2021-02-19 DIAGNOSIS — Z8709 Personal history of other diseases of the respiratory system: Secondary | ICD-10-CM

## 2021-02-19 NOTE — Progress Notes (Signed)
Chronic Care Management Pharmacy Note  02/19/2021 Name:  Tina Jimenez MRN:  [id number] DOB:  06/23/1940   Update:  ((Sept 2022)) Scheduled F/U Jan 2023 to renew PAP process for Feb BP has been a little high recently, could you please re-assess when she comes in for PCP visit next week? Could we add G72.0 ICD code due to statin intolerance  Subjective: Tina Jimenez is an 80 y.o. year old female who is a primary patient of Marge Duncans, [place].  The CCM team was consulted for assistance with disease management and care coordination needs.    Engaged with patient by telephone for follow up visit in response to provider referral for pharmacy case management and/or care coordination services.   Consent to Services:  The patient was given information about Chronic Care Management services, agreed to services, and gave verbal consent prior to initiation of services.  Please see initial visit note for detailed documentation.   Patient Care Team: Marge Duncans, Hershal Coria as PCP - General (Physician Assistant) Burnice Logan, [facility] as Pharmacist (Pharmacist)  Recent office visits: 07/11/2019 - stable labs.  Recent consult visits: 05/23/2020 - Cardiology - CAD - significant calcifications on CT scan. Zetia stopped and replaced with Nexlizet. Patient is intolerant to statin.  Hospital visits: None in previous 6 months  Objective:  Lab Results  Component Value Date   CREATININE 0.47 (L) 11/28/2020   BUN 4 (L) 11/28/2020   GFRNONAA 94 07/10/2020   GFRAA 108 07/10/2020   NA 143 11/28/2020   K 4.5 11/28/2020   CALCIUM 9.5 11/28/2020   CO2 32 (H) 11/28/2020    No results found for: HGBA1C, FRUCTOSAMINE, GFR, MICROALBUR  Last diabetic Eye exam: No results found for: HMDIABEYEEXA  Last diabetic Foot exam: No results found for: HMDIABFOOTEX   Lab Results  Component Value Date   CHOL 151 11/28/2020   HDL 52 11/28/2020   LDLCALC 81 11/28/2020   TRIG 100 11/28/2020   CHOLHDL 2.9  11/28/2020    Hepatic Function Latest Ref Rng & Units 11/28/2020 07/10/2020 04/08/2020  Total Protein 6.0 - 8.5 g/dL 6.3 6.6 6.9  Albumin 3.7 - 4.7 g/dL 3.9 4.2 4.2  AST 0 - 40 IU/L '13 16 16  ' ALT 0 - 32 IU/L '12 16 10  ' Alk Phosphatase 44 - 121 IU/L 90 112 107  Total Bilirubin 0.0 - 1.2 mg/dL 0.4 0.4 0.3    Lab Results  Component Value Date/Time   TSH 2.150 01/04/2020 10:20 AM    CBC Latest Ref Rng & Units 11/28/2020 07/10/2020 04/08/2020  WBC 3.4 - 10.8 x10E3/uL 9.4 9.9 10.3  Hemoglobin 11.1 - 15.9 g/dL 12.1 12.6 13.2  Hematocrit 34.0 - 46.6 % 38.9 37.6 40.8  Platelets 150 - 450 x10E3/uL 217 200 171    No results found for: VD25OH  Clinical ASCVD: Yes  The 10-year ASCVD risk score Mikey Bussing DC Jr., et al., 2013) is: 37.5%   Values used to calculate the score:     Age: 82 years     Sex: Female     Is Non-Hispanic African American: No     Diabetic: No     Tobacco smoker: No     Systolic Blood Pressure: 076 mmHg     Is BP treated: Yes     HDL Cholesterol: 52 mg/dL     Total Cholesterol: 151 mg/dL    Depression screen [facility] 2/9 05/21/2020 04/08/2020  Decreased Interest 0 0  Down, Depressed, Hopeless 0 0  PHQ - 2 Score 0 0     Other: (CHADS2VASc if Afib, MMRC or CAT for COPD, ACT, DEXA)  Social History   Tobacco Use  Smoking Status Former   Types: Cigarettes   Quit date: 11/01/2018   Years since quitting: 2.3  Smokeless Tobacco Never   BP Readings from Last 3 Encounters:  01/03/21 (!) 152/101  11/28/20 (!) 160/88  07/10/20 138/84   Pulse Readings from Last 3 Encounters:  01/03/21 97  11/28/20 78  07/10/20 (!) 46   Wt Readings from Last 3 Encounters:  01/03/21 137 lb (62.1 kg)  11/28/20 138 lb 9.6 oz (62.9 kg)  07/10/20 146 lb 9.6 oz (66.5 kg)    Assessment/Interventions: Review of patient past medical history, allergies, medications, health status, including review of consultants reports, laboratory and other test data, was performed as part of comprehensive  evaluation and provision of chronic care management services.   SDOH:  (Social Determinants of Health) assessments and interventions performed: Yes   Care Plan : [place]  Updates made by Lane Hacker, [place] since 02/19/2021 12:00 AM     Problem: cad, hld, copd   Priority: High  Onset Date: 08/19/2020     Long-Range Goal: Disease Managment   Start Date: 08/19/2020  Expected End Date: 08/19/2021  Recent Progress: On track  Priority: High  Note:    Current Barriers:  Needs PAP renewed yearly  Pharmacist Clinical Goal(s):  Patient will contact provider office for questions/concerns as evidenced notation of same in electronic health record through collaboration with PharmD and provider.   Interventions: 1:1 collaboration with Marge Duncans, PA-C regarding development and update of comprehensive plan of care as evidenced by provider attestation and co-signature Inter-disciplinary care team collaboration (see longitudinal plan of care) Comprehensive medication review performed; medication list updated in electronic medical record  Hypertension (BP goal <140/90) BP Readings from Last 3 Encounters:  01/03/21 (!) 152/101  11/28/20 (!) 160/88  07/10/20 138/84  -Uncontrolled -Current treatment: Carvedilol 6.65m Digoxin 0.1254m-Medications previously tried: N/A  -Current home readings: Doesn't take -Current dietary habits: (Patient didn't specify) -Current exercise habits: N/A due to mobility -Denies hypotensive/hypertensive symptoms -Educated on BP goals and benefits of medications for prevention of heart attack, stroke and kidney damage; -Counseled to monitor BP at home if possible, document, and provide log at future appointments -Counseled on diet and exercise extensively Sept 2022: Has appt with PCP next week, will defer to them since will be able to get BP reading  Hyperlipidemia: (LDL goal < 100) -Controlled Lab Results  Component Value Date   CHOL 151  11/28/2020   CHOL 202 (H) 07/10/2020   CHOL 202 (H) 04/08/2020   Lab Results  Component Value Date   HDL 52 11/28/2020   HDL 57 07/10/2020   HDL 58 04/08/2020   Lab Results  Component Value Date   LDLCALC 81 11/28/2020   LDLCALC 127 (H) 07/10/2020   LDLCALC 125 (H) 04/08/2020   Lab Results  Component Value Date   TRIG 100 11/28/2020   TRIG 98 07/10/2020   TRIG 104 04/08/2020   Lab Results  Component Value Date   CHOLHDL 2.9 11/28/2020   CHOLHDL 3.5 07/10/2020   CHOLHDL 3.5 04/08/2020  No results found for: LDLDIRECT  -Current treatment: Nexlizet -Medications previously tried: Statin  -Current dietary patterns: Didn't specify -Current exercise habits: N/A due to mobility -Educated on Cholesterol goals;  -Counseled on diet and exercise extensively  COPD (Goal: control symptoms and  prevent exacerbations) -Not ideally controlled -Current treatment  Breo Albuterol -Medications previously tried: N/A  -Patient reports consistent use of maintenance inhaler -Frequency of rescue inhaler use: 3-4x/day -Counseled on Proper inhaler technique; And that Albuterol is PRN, not daily -Counseled on Inhaler technique  Patient Goals/Self-Care Activities Patient will:  - take medications as prescribed  Follow Up Plan: Face to Face appointment with care management team member scheduled for: Jan 2023      Follow Up Plan: Face to Face appointment with care management team member scheduled for: Jan 2023

## 2021-02-19 NOTE — Patient Instructions (Signed)
Visit Information   Goals Addressed             This Visit's Progress    Track and Manage My Blood Pressure-Hypertension       Timeframe:  Long-Range Goal Priority:  High Start Date:                             Expected End Date:                       Follow Up Date 02/19/22    - choose a place to take my blood pressure (home, clinic or office, retail store)    Why is this important?   You won't feel high blood pressure, but it can still hurt your blood vessels.  High blood pressure can cause heart or kidney problems. It can also cause a stroke.  Making lifestyle changes like losing a little weight or eating less salt will help.  Checking your blood pressure at home and at different times of the day can help to control blood pressure.  If the doctor prescribes medicine remember to take it the way the doctor ordered.  Call the office if you cannot afford the medicine or if there are questions about it.     Notes:        Patient Care Plan: CCM Pharmacy Care Plan     Problem Identified: cad, hld, copd   Priority: High  Onset Date: 08/19/2020     Long-Range Goal: Disease Managment   Start Date: 08/19/2020  Expected End Date: 08/19/2021  Recent Progress: On track  Priority: High  Note:    Current Barriers:  Needs PAP renewed yearly  Pharmacist Clinical Goal(s):  Patient will contact provider office for questions/concerns as evidenced notation of same in electronic health record through collaboration with PharmD and provider.   Interventions: 1:1 collaboration with Marge Duncans, PA-C regarding development and update of comprehensive plan of care as evidenced by provider attestation and co-signature Inter-disciplinary care team collaboration (see longitudinal plan of care) Comprehensive medication review performed; medication list updated in electronic medical record  Hypertension (BP goal <140/90) BP Readings from Last 3 Encounters:  01/03/21 (!) 152/101  11/28/20 (!)  160/88  07/10/20 138/84  -Uncontrolled -Current treatment: Carvedilol 6.'25mg'$  Digoxin 0.'125mg'$  -Medications previously tried: N/A  -Current home readings: Doesn't take -Current dietary habits: (Patient didn't specify) -Current exercise habits: N/A due to mobility -Denies hypotensive/hypertensive symptoms -Educated on BP goals and benefits of medications for prevention of heart attack, stroke and kidney damage; -Counseled to monitor BP at home if possible, document, and provide log at future appointments -Counseled on diet and exercise extensively Sept 2022: Has appt with PCP next week, will defer to them since will be able to get BP reading  Hyperlipidemia: (LDL goal < 100) -Controlled Lab Results  Component Value Date   CHOL 151 11/28/2020   CHOL 202 (H) 07/10/2020   CHOL 202 (H) 04/08/2020   Lab Results  Component Value Date   HDL 52 11/28/2020   HDL 57 07/10/2020   HDL 58 04/08/2020   Lab Results  Component Value Date   LDLCALC 81 11/28/2020   LDLCALC 127 (H) 07/10/2020   LDLCALC 125 (H) 04/08/2020   Lab Results  Component Value Date   TRIG 100 11/28/2020   TRIG 98 07/10/2020   TRIG 104 04/08/2020   Lab Results  Component Value Date   CHOLHDL 2.9  11/28/2020   CHOLHDL 3.5 07/10/2020   CHOLHDL 3.5 04/08/2020  No results found for: LDLDIRECT  -Current treatment: Nexlizet -Medications previously tried: Statin  -Current dietary patterns: Didn't specify -Current exercise habits: N/A due to mobility -Educated on Cholesterol goals;  -Counseled on diet and exercise extensively  COPD (Goal: control symptoms and prevent exacerbations) -Not ideally controlled -Current treatment  Breo Albuterol -Medications previously tried: N/A  -Patient reports consistent use of maintenance inhaler -Frequency of rescue inhaler use: 3-4x/day -Counseled on Proper inhaler technique; And that Albuterol is PRN, not daily -Counseled on Inhaler technique  Patient Goals/Self-Care  Activities Patient will:  - take medications as prescribed  Follow Up Plan: Face to Face appointment with care management team member scheduled for: Jan 2023      The patient verbalized understanding of instructions, educational materials, and care plan provided today and declined offer to receive copy of patient instructions, educational materials, and care plan.  Face to Face appointment with pharmacist scheduled for: Jan 2023  Lane Hacker, [facility]

## 2021-02-22 ENCOUNTER — Telehealth: Payer: Self-pay

## 2021-02-22 NOTE — Telephone Encounter (Signed)
Called patient to schedule AWV and left message for patient to call office to schedule AWV.

## 2021-03-05 ENCOUNTER — Ambulatory Visit: Payer: Medicare Other | Admitting: Physician Assistant

## 2021-03-11 DIAGNOSIS — J449 Chronic obstructive pulmonary disease, unspecified: Secondary | ICD-10-CM | POA: Diagnosis not present

## 2021-03-13 ENCOUNTER — Other Ambulatory Visit: Payer: Self-pay | Admitting: Physician Assistant

## 2021-03-14 DIAGNOSIS — I480 Paroxysmal atrial fibrillation: Secondary | ICD-10-CM | POA: Diagnosis not present

## 2021-03-14 DIAGNOSIS — I10 Essential (primary) hypertension: Secondary | ICD-10-CM

## 2021-03-14 DIAGNOSIS — E782 Mixed hyperlipidemia: Secondary | ICD-10-CM | POA: Diagnosis not present

## 2021-03-14 DIAGNOSIS — J449 Chronic obstructive pulmonary disease, unspecified: Secondary | ICD-10-CM | POA: Diagnosis not present

## 2021-03-15 ENCOUNTER — Other Ambulatory Visit: Payer: Self-pay | Admitting: Cardiology

## 2021-03-15 ENCOUNTER — Other Ambulatory Visit: Payer: Self-pay | Admitting: Family Medicine

## 2021-03-17 ENCOUNTER — Other Ambulatory Visit: Payer: Self-pay

## 2021-03-17 ENCOUNTER — Encounter: Payer: Self-pay | Admitting: Physician Assistant

## 2021-03-17 ENCOUNTER — Ambulatory Visit (INDEPENDENT_AMBULATORY_CARE_PROVIDER_SITE_OTHER): Payer: Medicare Other | Admitting: Physician Assistant

## 2021-03-17 VITALS — BP 142/84 | HR 58 | Temp 96.8°F | Ht 63.0 in | Wt 137.2 lb

## 2021-03-17 DIAGNOSIS — R0902 Hypoxemia: Secondary | ICD-10-CM

## 2021-03-17 DIAGNOSIS — Z23 Encounter for immunization: Secondary | ICD-10-CM

## 2021-03-17 DIAGNOSIS — E559 Vitamin D deficiency, unspecified: Secondary | ICD-10-CM | POA: Diagnosis not present

## 2021-03-17 DIAGNOSIS — J431 Panlobular emphysema: Secondary | ICD-10-CM

## 2021-03-17 DIAGNOSIS — I10 Essential (primary) hypertension: Secondary | ICD-10-CM

## 2021-03-17 DIAGNOSIS — I4819 Other persistent atrial fibrillation: Secondary | ICD-10-CM

## 2021-03-17 DIAGNOSIS — K219 Gastro-esophageal reflux disease without esophagitis: Secondary | ICD-10-CM

## 2021-03-17 DIAGNOSIS — E782 Mixed hyperlipidemia: Secondary | ICD-10-CM

## 2021-03-17 MED ORDER — FERROUS SULFATE 325 (65 FE) MG PO TABS: ORAL_TABLET | ORAL | 1 refills | Status: DC

## 2021-03-17 NOTE — Progress Notes (Signed)
Subjective:  Patient ID: Tina Jimenez, female    DOB: 1940-07-26  Age: 80 y.o. MRN: [id number]  Chief Complaint  Patient presents with   Hypertension     [name] is a 80 year old Caucasian female here for f/u of hypertension, a-fib, and COPD.   HYPERTENSION Pt presents for follow up of hypertension.The patient is tolerating the medication well without side effects. Compliance with treatment has been good; including taking medication as directed , and following up as directed. Currently taking coreg 6.25mg  bid   ATRIAL-FIBRILLATION Diagnosed a-fib in Dec 2020 current treatment Digoxin 0.125mg  daily. She is followed by cardiologist Dr Geraldo Pitter. She is not currently taking anticoagulants due to unsteady gait and previous back surgery. She does take ASA 81mg  qd -  Dr. Geraldo Pitter states in his note that she will consider resuming anticoagulation therapy in the future.  She denies lightheadedness, syncope, or chest pain   COPD with O2 Dependence The patient has a history of COPD for over 5 years she quit smoking cigarettes of 2020.  She is O2 dependent on 3 L of oxygen continuously today she has been brought into the clinic in a wheelchair assisted by her adult son she denies current shortness of breath or chest pain vital signs were stable.  Oxygen saturation is 98%.  Her COPD is well controlled with Breo Ellipta and DuoNeb inhalers.  She does not want COVID booster at this time but agrees to get flu shot today - Recommend for pt to follow with pulmonologist but she refuses at this point to make appt  Lipid/Cholesterol, Follow-up Pt has been started on Nexlizet for lipid management - voices no concerns or problems  The 10-year ASCVD risk score (Arnett DK, et al., 2019) is: 33.6%      Current Outpatient Medications on File Prior to Visit  Medication Sig Dispense Refill   Ascorbic Acid (VITAMIN C) 500 MG CAPS Take 1 tablet by mouth daily.     aspirin EC 81 MG tablet Take 81 mg by  mouth every other day. Swallow whole.     aspirin-acetaminophen-caffeine (EXCEDRIN MIGRAINE) 250-250-65 MG tablet Take 1-2 tablets by mouth every 6 (six) hours as needed for headache.     Bempedoic Acid-Ezetimibe (NEXLIZET) 180-10 MG TABS Take 1 tablet by mouth daily in the afternoon. 30 tablet 12   BREO ELLIPTA 100-25 MCG/INH AEPB INHALE 1 PUFF BY MOUTH EVERY DAY 60 each 5   carvedilol (COREG) 6.25 MG tablet TAKE 1 TABLET BY MOUTH TWICE A DAY 180 tablet 0   Co-Enzyme Q-10 100 MG CAPS Take 100 mg by mouth daily.     digoxin (LANOXIN) 0.125 MG tablet TAKE 1 TABLET BY MOUTH EVERY DAY 90 tablet 2   famotidine (PEPCID) 20 MG tablet Take 20 mg by mouth daily.     fluticasone (FLONASE) 50 MCG/ACT nasal spray Place 2 sprays into both nostrils daily.     ipratropium-albuterol (DUONEB) 0.5-2.5 (3) MG/3ML SOLN Take 3 mLs by nebulization 4 (four) times daily.     loratadine (CLARITIN) 10 MG tablet TAKE 1 TABLET BY MOUTH ONCE DAILY FOR ALLERGIES 90 tablet 1   omeprazole (PRILOSEC) 20 MG capsule Take 1 capsule (20 mg total) by mouth daily. 90 capsule 1   ondansetron (ZOFRAN) 4 MG tablet Take 4 mg by mouth every 8 (eight) hours as needed for nausea/vomiting.     polyethylene glycol (MIRALAX / GLYCOLAX) 17 g packet Take 17 g by mouth daily.     No current facility-administered medications  on file prior to visit.   Past Medical History:  Diagnosis Date   Abdominal pain 10/13/2019   Abnormal blood chemistry 08/22/2019   Acute blood loss anemia 06/22/2019   Acute bronchitis with COPD ([place]) 08/15/2019   Acute cystitis with hematuria 10/26/2019   Acute hypoxemic respiratory failure ([place]) 07/08/2019   Acute pain of left shoulder 10/26/2019   Acute pulmonary embolism without acute cor pulmonale (HCC) 06/21/2019   Adnexal mass    Anemia 01/08/2020   Arm DVT (deep venous thromboembolism), acute, left (HCC) 06/26/2019   Atrial fibrillation (HCC)    Bladder mass 10/26/2019   Compression fracture of body of thoracic vertebra  ([place]) 2020   COPD (chronic obstructive pulmonary disease) (HCC)    Coronary artery calcification seen on CT scan 05/23/2020   Dependence on supplemental oxygen 04/14/2020   GERD (gastroesophageal reflux disease)    History of pulmonary embolism 10/02/2019   Hyperlipemia    Hypertension 08/15/2019   Localized edema 08/22/2019   Mixed dyslipidemia 05/23/2020   Osteoporosis    Other fatigue 01/08/2020   Oxygen deficiency    Paroxysmal atrial fibrillation (HCC) 06/20/2019   Persistent atrial fibrillation ([place]) 08/22/2019   Pneumonia 05/2019   COVID Pneumonia   Pneumonia due to COVID-19 virus 07/08/2019   Precordial pain 06/20/2019   Pulmonary embolism ([place]) 05/2019   Spinal stenosis of lumbar region 06/14/2019   Vitamin D deficiency 06/22/2019   Past Surgical History:  Procedure Laterality Date   APPENDECTOMY     KYPHOPLASTY      Family History  Problem Relation Age of Onset   Breast cancer Sister    Lung cancer Sister    Lung cancer Daughter    Social History   Socioeconomic History   Marital status: Widowed    Spouse name: Not on file   Number of children: 4   Years of education: Not on file   Highest education level: Not on file  Occupational History   Occupation: retired  Tobacco Use   Smoking status: Former    Types: Cigarettes    Quit date: 11/01/2018    Years since quitting: 2.3   Smokeless tobacco: Never  Vaping Use   Vaping Use: Never used  Substance and Sexual Activity   Alcohol use: Never   Drug use: Never   Sexual activity: Not on file  Other Topics Concern   Not on file  Social History Narrative   Not on file   Social Determinants of Health   Financial Resource Strain: Not on file  Food Insecurity: No Food Insecurity   Worried About Charity fundraiser in the Last Year: Never true   [place] in the Last Year: Never true  Transportation Needs: Unmet Transportation Needs   Lack of Transportation (Medical): Yes   Lack of Transportation (Non-Medical):  Yes  Physical Activity: Not on file  Stress: Not on file  Social Connections: Not on file   CONSTITUTIONAL: Negative for chills, fatigue, fever, unintentional weight gain and unintentional weight loss.  E/N/T: Negative for ear pain, nasal congestion and sore throat.  CARDIOVASCULAR: Negative for chest pain, dizziness, palpitations and pedal edema.  RESPIRATORY: Negative for recent cough and dyspnea.  GASTROINTESTINAL: Negative for abdominal pain, acid reflux symptoms, constipation, diarrhea, nausea and vomiting.  MSK: Negative for arthralgias and myalgias.  INTEGUMENTARY: Negative for rash.  NEUROLOGICAL: Negative for dizziness and headaches.  PSYCHIATRIC: Negative for sleep disturbance and to question depression screen.  Negative for depression, negative for  anhedonia.      Objective:  PHYSICAL EXAM:   VS: BP (!) 142/84 (BP Location: Right Arm, Patient Position: Sitting, Cuff Size: Normal)   Pulse (!) 58   Temp (!) 96.8 F (36 C) (Temporal)   Ht 5\' 3"  (1.6 m)   Wt 137 lb 3.2 oz (62.2 kg)   SpO2 98%   BMI 24.30 kg/m   GEN: Well nourished, well developed, in no acute distress - in wheelchair on 3L O2 Cardiac: RRR; no murmurs, rubs, or gallops,no edema -  Respiratory:  normal respiratory rate and pattern with no distress - normal breath sounds with no rales, rhonchi, wheezes or rubs GI: normal bowel sounds, no masses or tenderness Skin: warm and dry, no rash  Neuro:  Alert and Oriented x 3,  Psych: euthymic mood, appropriate affect and demeanor             Lab Results  Component Value Date   WBC 9.4 11/28/2020   HGB 12.1 11/28/2020   HCT 38.9 11/28/2020   PLT 217 11/28/2020   GLUCOSE 101 (H) 11/28/2020   CHOL 151 11/28/2020   TRIG 100 11/28/2020   HDL 52 11/28/2020   LDLCALC 81 11/28/2020   ALT 12 11/28/2020   AST 13 11/28/2020   NA 143 11/28/2020   K 4.5 11/28/2020   CL 97 11/28/2020   CREATININE 0.47 (L) 11/28/2020   BUN 4 (L) 11/28/2020   CO2 32 (H)  11/28/2020   TSH 2.150 01/04/2020      Assessment & Plan:   1. Hypertension labwork pending Continue current meds  2. COPD not affecting current episode of care ([place]) - CBC with Differential - Comprehensive metabolic panel -Continue inhalers/ nebulizer Recommend pulmonology referral - pt refuses 3. Mixed hyperlipidemia - CBC with Differential - Lipid Panel - Comprehensive metabolic panel Continue meds 4 - History AFIB Continue meds and follow up with cardiology as directed 5 - NEED for immunization Fluad given   Follow up in 6 months     An After Visit Summary was printed and given to the patient.  [place] [phone]

## 2021-03-18 LAB — CBC WITH DIFFERENTIAL/PLATELET
Basophils Absolute: 0.2 10*3/uL (ref 0.0–0.2)
Basos: 1 %
EOS (ABSOLUTE): 0.3 10*3/uL (ref 0.0–0.4)
Eos: 3 %
Hematocrit: 44.5 % (ref 34.0–46.6)
Hemoglobin: 14.4 g/dL (ref 11.1–15.9)
Immature Grans (Abs): 0 10*3/uL (ref 0.0–0.1)
Immature Granulocytes: 0 %
Lymphocytes Absolute: 1.3 10*3/uL (ref 0.7–3.1)
Lymphs: 12 %
MCH: 29.8 pg (ref 26.6–33.0)
MCHC: 32.4 g/dL (ref 31.5–35.7)
MCV: 92 fL (ref 79–97)
Monocytes Absolute: 0.8 10*3/uL (ref 0.1–0.9)
Monocytes: 7 %
Neutrophils Absolute: 8.5 10*3/uL — ABNORMAL HIGH (ref 1.4–7.0)
Neutrophils: 77 %
Platelets: 222 10*3/uL (ref 150–450)
RBC: 4.84 x10E6/uL (ref 3.77–5.28)
RDW: 12 % (ref 11.7–15.4)
WBC: 11.1 10*3/uL — ABNORMAL HIGH (ref 3.4–10.8)

## 2021-03-18 LAB — LIPID PANEL
Chol/HDL Ratio: 3.1 ratio (ref 0.0–4.4)
Cholesterol, Total: 141 mg/dL (ref 100–199)
HDL: 45 mg/dL (ref >39)
LDL Chol Calc (NIH): 77 mg/dL (ref 0–99)
Triglycerides: 105 mg/dL (ref 0–149)
VLDL Cholesterol Cal: 19 mg/dL (ref 5–40)

## 2021-03-18 LAB — COMPREHENSIVE METABOLIC PANEL
ALT: 14 IU/L (ref 0–32)
AST: 21 IU/L (ref 0–40)
Albumin/Globulin Ratio: 2 (ref 1.2–2.2)
Albumin: 4.5 g/dL (ref 3.7–4.7)
Alkaline Phosphatase: 97 IU/L (ref 44–121)
BUN/Creatinine Ratio: 20 (ref 12–28)
BUN: 12 mg/dL (ref 8–27)
Bilirubin Total: 0.4 mg/dL (ref 0.0–1.2)
CO2: 33 mmol/L — ABNORMAL HIGH (ref 20–29)
Calcium: 9.9 mg/dL (ref 8.7–10.3)
Chloride: 97 mmol/L (ref 96–106)
Creatinine, Ser: 0.6 mg/dL (ref 0.57–1.00)
Globulin, Total: 2.2 g/dL (ref 1.5–4.5)
Glucose: 98 mg/dL (ref 70–99)
Potassium: 5.2 mmol/L (ref 3.5–5.2)
Sodium: 143 mmol/L (ref 134–144)
Total Protein: 6.7 g/dL (ref 6.0–8.5)
eGFR: 92 mL/min/{1.73_m2} (ref >59)

## 2021-03-18 LAB — CARDIOVASCULAR RISK ASSESSMENT

## 2021-03-18 LAB — VITAMIN D 25 HYDROXY (VIT D DEFICIENCY, FRACTURES): Vit D, 25-Hydroxy: 15.6 ng/mL — ABNORMAL LOW (ref 30.0–100.0)

## 2021-03-18 LAB — TSH: TSH: 2.36 u[IU]/mL (ref 0.450–4.500)

## 2021-03-31 ENCOUNTER — Telehealth: Payer: Self-pay

## 2021-03-31 NOTE — Chronic Care Management (AMB) (Signed)
    Chronic Care Management Pharmacy Assistant   Name: Tina Jimenez  MRN: [id number] DOB: 17-Sep-1940   Reason for Encounter: Disease State/ COPD  Recent office visits:  03-17-2021 Marge Duncans, PA-C. WBC= 11.1, Neutrophils absolute= 8.5. CO2= 33. Vitamin D 25= 15.6.  Recent consult visits:  None  Hospital visits:  None in previous 6 months  Medications: Outpatient Encounter Medications as of 03/31/2021  Medication Sig   Ascorbic Acid (VITAMIN C) 500 MG CAPS Take 1 tablet by mouth daily.   aspirin EC 81 MG tablet Take 81 mg by mouth every other day. Swallow whole.   aspirin-acetaminophen-caffeine (EXCEDRIN MIGRAINE) 250-250-65 MG tablet Take 1-2 tablets by mouth every 6 (six) hours as needed for headache.   Bempedoic Acid-Ezetimibe (NEXLIZET) 180-10 MG TABS Take 1 tablet by mouth daily in the afternoon.   BREO ELLIPTA 100-25 MCG/INH AEPB INHALE 1 PUFF BY MOUTH EVERY DAY   carvedilol (COREG) 6.25 MG tablet TAKE 1 TABLET BY MOUTH TWICE A DAY   Co-Enzyme Q-10 100 MG CAPS Take 100 mg by mouth daily.   digoxin (LANOXIN) 0.125 MG tablet TAKE 1 TABLET BY MOUTH EVERY DAY   famotidine (PEPCID) 20 MG tablet Take 20 mg by mouth daily.   ferrous sulfate 325 (65 FE) MG tablet TAKE 1 TABLET BY MOUTH EVERY DAY WITH BREAKFAST   fluticasone (FLONASE) 50 MCG/ACT nasal spray Place 2 sprays into both nostrils daily.   ipratropium-albuterol (DUONEB) 0.5-2.5 (3) MG/3ML SOLN USE 1 NEBULE IN NEBULIZER EVERY MORNING, NOON, EVENING, AND BEDTIME.   loratadine (CLARITIN) 10 MG tablet TAKE 1 TABLET BY MOUTH ONCE DAILY FOR ALLERGIES   omeprazole (PRILOSEC) 20 MG capsule Take 1 capsule (20 mg total) by mouth daily.   ondansetron (ZOFRAN) 4 MG tablet Take 4 mg by mouth every 8 (eight) hours as needed for nausea/vomiting.   polyethylene glycol (MIRALAX / GLYCOLAX) 17 g packet Take 17 g by mouth daily.   No facility-administered encounter medications on file as of 03/31/2021.    Current COPD regimen:   Breo ellipta 1 puff daily Duo nebulizer 1 puff 4 times daily as needed No flowsheet data found.  Any recent hospitalizations or ED visits since last visit with CPP? No  denies COPD symptoms   What recent interventions/DTPs have been made by any provider to improve breathing since last visit: Counseled on Proper inhaler technique; And that Albuterol is PRN, not daily Counseled on Inhaler technique   Have you had exacerbation/flare-up since last visit? No  What do you do when you are short of breath?  Rescue medication and Rest  Current tobacco use? Interested in cessation? No  Respiratory Devices/Equipment Do you have a nebulizer? Yes Do you use a Peak Flow Meter? No Do you use a maintenance inhaler? Yes How often do you forget to use your daily inhaler? Patient states never Do you use a rescue inhaler? Yes How often do you use your rescue inhaler?  daily Do you use a spacer with your inhaler? No  Adherence Review: Does the patient have >5 day gap between last estimated fill date for maintenance inhaler medications? CPP to review  Care Gaps: Last annual wellness visit? None   Star Rating Drugs: None  NOTES: Patient states she has been doing much better since the weather has cooled off some.  [place] Pharmacist Assistant [phone]

## 2021-04-10 DIAGNOSIS — J449 Chronic obstructive pulmonary disease, unspecified: Secondary | ICD-10-CM | POA: Diagnosis not present

## 2021-04-14 DIAGNOSIS — J449 Chronic obstructive pulmonary disease, unspecified: Secondary | ICD-10-CM | POA: Diagnosis not present

## 2021-04-21 ENCOUNTER — Other Ambulatory Visit: Payer: Self-pay

## 2021-04-21 DIAGNOSIS — K219 Gastro-esophageal reflux disease without esophagitis: Secondary | ICD-10-CM

## 2021-04-21 MED ORDER — OMEPRAZOLE 20 MG PO CPDR: 20.0000 mg | DELAYED_RELEASE_CAPSULE | Freq: Every day | ORAL | 1 refills | Status: DC

## 2021-04-25 ENCOUNTER — Other Ambulatory Visit: Payer: Self-pay | Admitting: Physician Assistant

## 2021-05-06 ENCOUNTER — Other Ambulatory Visit: Payer: Self-pay

## 2021-05-06 MED ORDER — IPRATROPIUM-ALBUTEROL 0.5-2.5 (3) MG/3ML IN SOLN: RESPIRATORY_TRACT | 1 refills | Status: DC

## 2021-05-07 IMAGING — CT CT ANGIO CHEST-ABD-PELV FOR DISSECTION W/ AND WO/W CM
2 of 7 series · 9 of 46 positions shown, 10 images · IV contrast (OMNI 350)
Comparison: CT abdomen/pelvis 08/31/2019, CT chest 11/16/2017 and
CT 06/02/2016, CT 05/20/2013
COMPARISON: CT abdomen/pelvis 08/31/2019, CT chest 11/16/2017 and
CT 06/02/2016, CT 05/20/2013

Addendum:
CLINICAL DATA: Severe abdominal pain since yesterday with pain mid
back.

EXAM:
CT ANGIOGRAPHY CHEST, ABDOMEN AND PELVIS
TECHNIQUE: Non-contrast CT of the chest was initially obtained.

[Series 7: dissection 2mm · axial · 0.79mm/px · z∈[-333,+123]mm · 6 of 294 slices shown, 7 images]
[im 33/294  soft-tissue]
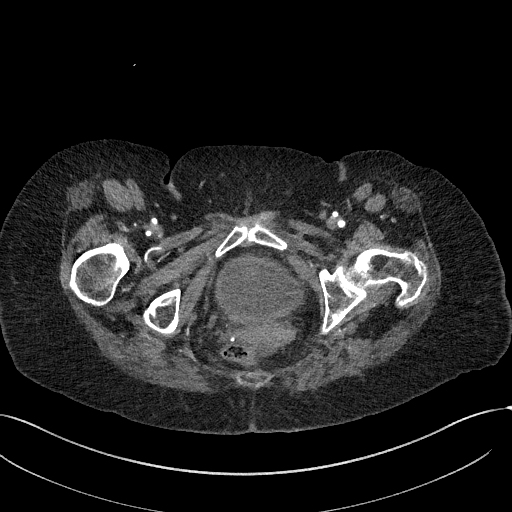
[im 33/294  bone]
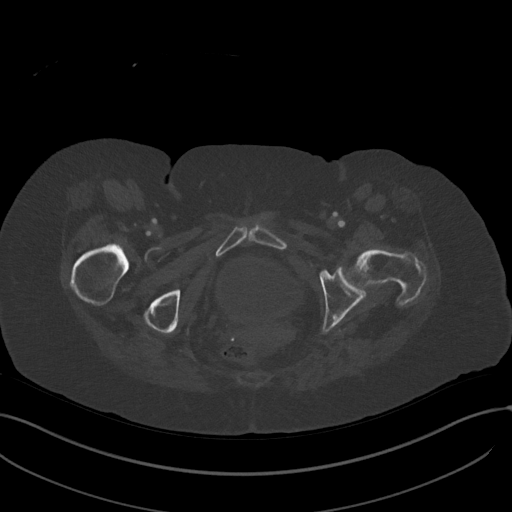
[im 82/294  soft-tissue]
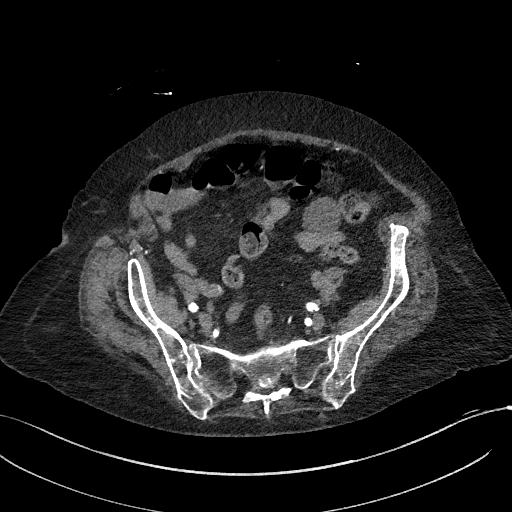
[im 131/294  soft-tissue]
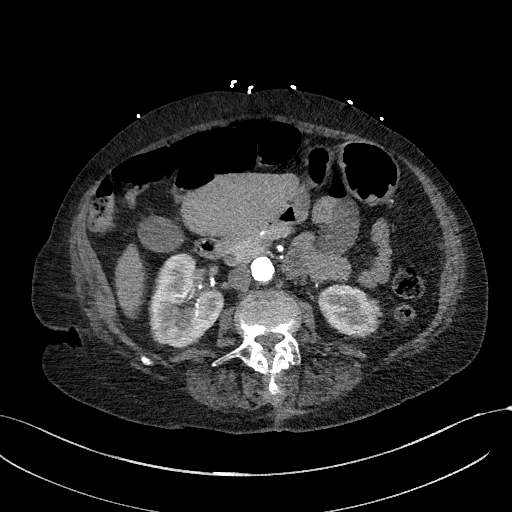
[im 163/294  soft-tissue]
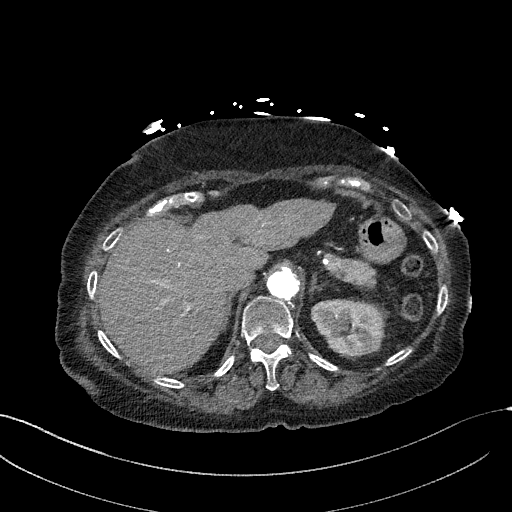
[im 212/294  soft-tissue]
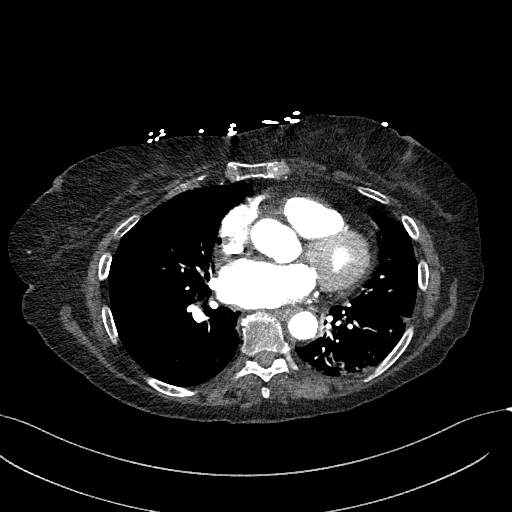
[im 261/294  soft-tissue]
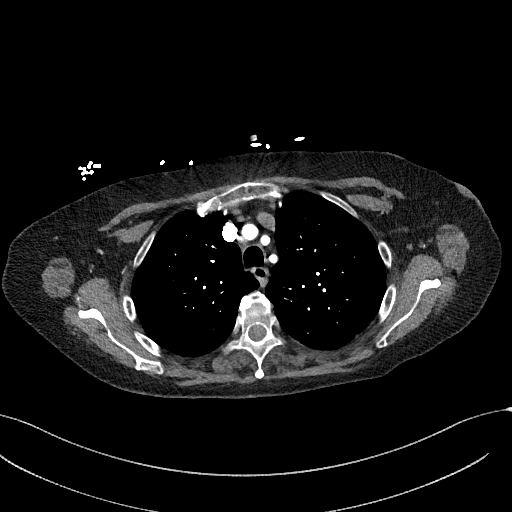

[Series 10: dissection 2mm cor · coronal · 0.70mm/px · 3 of 149 slices shown]
[im 38/149  soft-tissue]
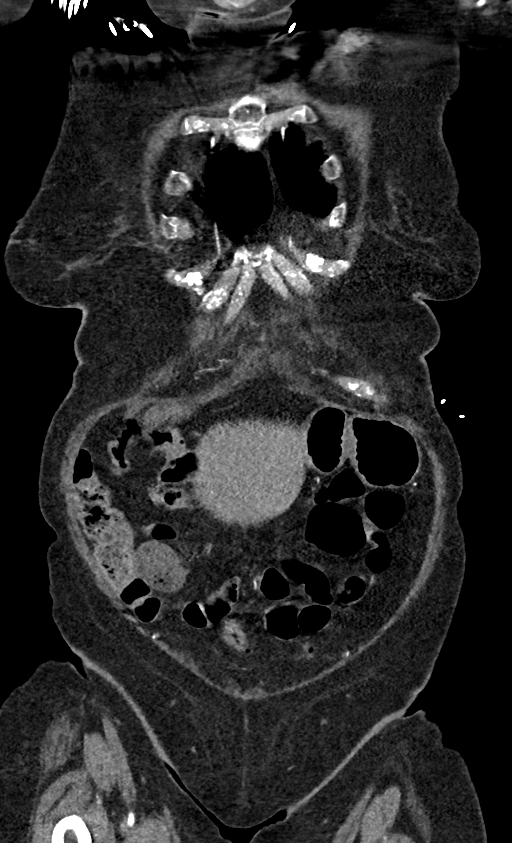
[im 75/149  soft-tissue]
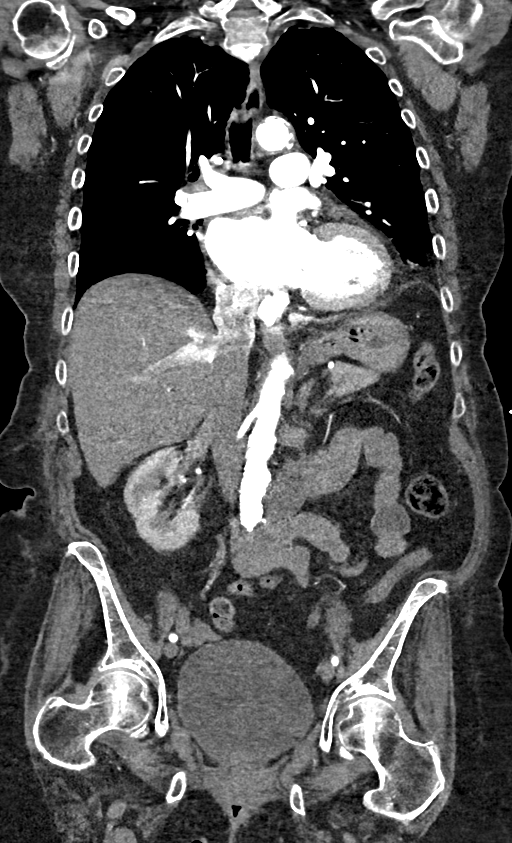
[im 112/149  soft-tissue]
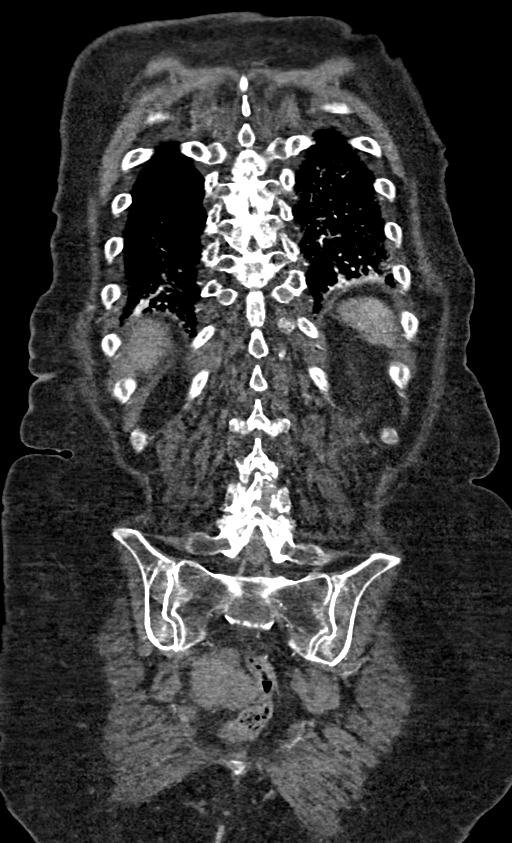

[9 of 46 positions shown; findings below may reference images not displayed]

Multidetector CT imaging through the chest, abdomen and pelvis was
performed using the standard protocol during bolus administration of
intravenous contrast. Multiplanar reconstructed images and MIPs were
obtained and reviewed to evaluate the vascular anatomy.

CONTRAST:  100mL OMNIPAQUE IOHEXOL 350 MG/ML SOLN
FINDINGS: CTA CHEST FINDINGS

Cardiovascular: Mild cardiomegaly and tiny amount of pericardial
fluid. Minimal calcified plaque over the left main and 3 vessel
coronary arteries. No evidence of thoracic aortic aneurysm.
Calcified plaque is present throughout the thoracic aorta. No
evidence of aortic dissection. Pulmonary arterial system is well
opacified without emboli.

Mediastinum/Nodes: No significant mediastinal or hilar adenopathy. A
few calcified mediastinal left hilar lymph nodes are present.
Remaining mediastinal structures are unremarkable.

Lungs/Pleura: There is moderate centrilobular emphysematous disease
present. Lungs are adequately inflated with minimal dependent
bibasilar atelectasis left worse than right. Calcified granuloma
left lower lobe. Airways are normal.

Musculoskeletal: No acute findings. Prominent loose body over the
posterior left shoulder joint unchanged.

Review of the MIP images confirms the above findings.

CTA ABDOMEN AND PELVIS FINDINGS

VASCULAR

Aorta: Abdominal aorta is normal in caliber without evidence of
aneurysm. There is small focal pseudoaneurysm over the left
anterolateral aspect of the proximal abdominal aorta at the level of
the diaphragmatic hiatus unchanged from 1218. Mild-to-moderate
calcified plaque throughout the abdominal aorta.

Celiac: Patent without evidence of aneurysm, dissection, vasculitis
or significant stenosis.

SMA: Patent without evidence of aneurysm, dissection, vasculitis or
significant stenosis.

Renals: Both renal arteries are patent without evidence of aneurysm,
dissection, vasculitis, fibromuscular dysplasia or significant
stenosis.

IMA: Patent without evidence of aneurysm, dissection, vasculitis or
significant stenosis.

Inflow: Calcified plaque over the iliac arteries bilaterally without
significant focal stenosis or occlusion.

Veins: Unremarkable.

Review of the MIP images confirms the above findings.

NON-VASCULAR

Hepatobiliary: Liver, gallbladder and biliary tree are unchanged.
Subtle nodular contour of the liver.

Pancreas: Normal.

Spleen: Unremarkable.

Adrenals/Urinary Tract: Adrenal glands are normal. Kidneys are
normal in size without hydronephrosis. Ureters are unremarkable.
Bladder is normally distended. There is mild increased soft tissue
density over the midline floor of the bladder just anterior to the
trigone as cannot exclude a possible mass.

Stomach/Bowel: Stomach and small bowel are within normal. Evidence
of previous appendectomy. Colon is normal.

Lymphatic: No evidence of adenopathy.

Reproductive: Uterus is just left of midline and unremarkable. Left
ovary is normal. There is an oval solid mass over the right adnexa
abutting the right lateral aspect of the uterus measuring 4.4 x
cm without significant change in size from recent CT and only
slightly increased in size compared to 8001.

Other: Tiny amount of free fluid in the pelvis. No focal
inflammatory change.

Musculoskeletal: Evidence of patient's compression fractures post
kyphoplasty involving L2, L3 and L4. There is mild compression
fracture of L5 slightly worse compared to the recent prior exam.
Degenerative change of the spine and hips.

Review of the MIP images confirms the above findings.
IMPRESSION: 1. No evidence of significant aneurysm or dissection involving the
thoracoabdominal aorta. There is subtle focal small pseudoaneurysm
over the abdominal aorta at the level of the diaphragmatic hiatus
unchanged from 1218.

2.  No acute findings in the chest, abdomen or pelvis.

3. Aortic Atherosclerosis (DXJAG-UPQ.Q) and Emphysema (DXJAG-OUN.7).
Mild cardiomegaly with atherosclerotic coronary artery disease.

4. 6.1 cm solid mass over the right adnexa without significant
change from August 2019 although slightly increased in size compared
to remote prior exams dating back to 8001. This may represent a
right ovarian mass such as low-grade malignancy versus serosal
fibroid. No adenopathy. Consider OBGYN consultation for possible
excision.

5. Multiple stable lumbar spine compression fractures post
kyphoplasty with slight worsening of a mild L5 compression fracture.
IMPRESSION: 6. Increased soft tissue density over the anterior aspect of the
bladder base just anterior to the region of the trigone as cannot
exclude a mass in this location. Recommend clinical correlation.

*** End of Addendum ***
Multidetector CT imaging through the chest, abdomen and pelvis was
performed using the standard protocol during bolus administration of
intravenous contrast. Multiplanar reconstructed images and MIPs were
obtained and reviewed to evaluate the vascular anatomy.

CONTRAST:  100mL OMNIPAQUE IOHEXOL 350 MG/ML SOLN
FINDINGS: CTA CHEST FINDINGS

Cardiovascular: Mild cardiomegaly and tiny amount of pericardial
fluid. Minimal calcified plaque over the left main and 3 vessel
coronary arteries. No evidence of thoracic aortic aneurysm.
Calcified plaque is present throughout the thoracic aorta. No
evidence of aortic dissection. Pulmonary arterial system is well
opacified without emboli.

Mediastinum/Nodes: No significant mediastinal or hilar adenopathy. A
few calcified mediastinal left hilar lymph nodes are present.
Remaining mediastinal structures are unremarkable.

Lungs/Pleura: There is moderate centrilobular emphysematous disease
present. Lungs are adequately inflated with minimal dependent
bibasilar atelectasis left worse than right. Calcified granuloma
left lower lobe. Airways are normal.

Musculoskeletal: No acute findings. Prominent loose body over the
posterior left shoulder joint unchanged.

Review of the MIP images confirms the above findings.

CTA ABDOMEN AND PELVIS FINDINGS

VASCULAR

Aorta: Abdominal aorta is normal in caliber without evidence of
aneurysm. There is small focal pseudoaneurysm over the left
anterolateral aspect of the proximal abdominal aorta at the level of
the diaphragmatic hiatus unchanged from 1218. Mild-to-moderate
calcified plaque throughout the abdominal aorta.

Celiac: Patent without evidence of aneurysm, dissection, vasculitis
or significant stenosis.

SMA: Patent without evidence of aneurysm, dissection, vasculitis or
significant stenosis.

Renals: Both renal arteries are patent without evidence of aneurysm,
dissection, vasculitis, fibromuscular dysplasia or significant
stenosis.

IMA: Patent without evidence of aneurysm, dissection, vasculitis or
significant stenosis.

Inflow: Calcified plaque over the iliac arteries bilaterally without
significant focal stenosis or occlusion.

Veins: Unremarkable.

Review of the MIP images confirms the above findings.

NON-VASCULAR

Hepatobiliary: Liver, gallbladder and biliary tree are unchanged.
Subtle nodular contour of the liver.

Pancreas: Normal.

Spleen: Unremarkable.

Adrenals/Urinary Tract: Adrenal glands are normal. Kidneys are
normal in size without hydronephrosis. Ureters are unremarkable.
Bladder is normally distended. There is mild increased soft tissue
density over the midline floor of the bladder just anterior to the
trigone as cannot exclude a possible mass.

Stomach/Bowel: Stomach and small bowel are within normal. Evidence
of previous appendectomy. Colon is normal.

Lymphatic: No evidence of adenopathy.

Reproductive: Uterus is just left of midline and unremarkable. Left
ovary is normal. There is an oval solid mass over the right adnexa
abutting the right lateral aspect of the uterus measuring 4.4 x
cm without significant change in size from recent CT and only
slightly increased in size compared to 8001.

Other: Tiny amount of free fluid in the pelvis. No focal
inflammatory change.

Musculoskeletal: Evidence of patient's compression fractures post
kyphoplasty involving L2, L3 and L4. There is mild compression
fracture of L5 slightly worse compared to the recent prior exam.
Degenerative change of the spine and hips.

Review of the MIP images confirms the above findings.
IMPRESSION: 1. No evidence of significant aneurysm or dissection involving the
thoracoabdominal aorta. There is subtle focal small pseudoaneurysm
over the abdominal aorta at the level of the diaphragmatic hiatus
unchanged from 1218.

2.  No acute findings in the chest, abdomen or pelvis.

3. Aortic Atherosclerosis (DXJAG-UPQ.Q) and Emphysema (DXJAG-OUN.7).
Mild cardiomegaly with atherosclerotic coronary artery disease.

4. 6.1 cm solid mass over the right adnexa without significant
change from August 2019 although slightly increased in size compared
to remote prior exams dating back to 8001. This may represent a
right ovarian mass such as low-grade malignancy versus serosal
fibroid. No adenopathy. Consider OBGYN consultation for possible
excision.

5. Multiple stable lumbar spine compression fractures post
kyphoplasty with slight worsening of a mild L5 compression fracture.

## 2021-05-11 DIAGNOSIS — J449 Chronic obstructive pulmonary disease, unspecified: Secondary | ICD-10-CM | POA: Diagnosis not present

## 2021-05-14 DIAGNOSIS — J449 Chronic obstructive pulmonary disease, unspecified: Secondary | ICD-10-CM | POA: Diagnosis not present

## 2021-05-26 ENCOUNTER — Other Ambulatory Visit: Payer: Self-pay | Admitting: Cardiology

## 2021-05-26 DIAGNOSIS — I2510 Atherosclerotic heart disease of native coronary artery without angina pectoris: Secondary | ICD-10-CM

## 2021-05-26 DIAGNOSIS — E782 Mixed hyperlipidemia: Secondary | ICD-10-CM

## 2021-05-29 ENCOUNTER — Telehealth: Payer: Self-pay

## 2021-05-29 NOTE — Chronic Care Management (AMB) (Signed)
° ° °  Chronic Care Management Pharmacy Assistant   Name: Tina Jimenez  MRN: [id number] DOB: May 02, 1941   Reason for Encounter: Disease State/ COPD  Recent office visits:  None  Recent consult visits:  None  Hospital visits:  None in previous 6 months  Medications: Outpatient Encounter Medications as of 05/29/2021  Medication Sig   Ascorbic Acid (VITAMIN C) 500 MG CAPS Take 1 tablet by mouth daily.   aspirin EC 81 MG tablet Take 81 mg by mouth every other day. Swallow whole.   aspirin-acetaminophen-caffeine (EXCEDRIN MIGRAINE) 250-250-65 MG tablet Take 1-2 tablets by mouth every 6 (six) hours as needed for headache.   Bempedoic Acid-Ezetimibe (NEXLIZET) 180-10 MG TABS Take 1 tablet by mouth daily in the afternoon.   BREO ELLIPTA 100-25 MCG/INH AEPB INHALE 1 PUFF BY MOUTH EVERY DAY   carvedilol (COREG) 6.25 MG tablet TAKE 1 TABLET BY MOUTH TWICE A DAY   Co-Enzyme Q-10 100 MG CAPS Take 100 mg by mouth daily.   digoxin (LANOXIN) 0.125 MG tablet TAKE 1 TABLET BY MOUTH EVERY DAY   famotidine (PEPCID) 20 MG tablet Take 20 mg by mouth daily.   ferrous sulfate 325 (65 FE) MG tablet TAKE 1 TABLET BY MOUTH EVERY DAY WITH BREAKFAST   fluticasone (FLONASE) 50 MCG/ACT nasal spray Place 2 sprays into both nostrils daily.   ipratropium-albuterol (DUONEB) 0.5-2.5 (3) MG/3ML SOLN USE 1 NEBULE IN NEBULIZER EVERY MORNING, NOON, EVENING, AND BEDTIME.   loratadine (CLARITIN) 10 MG tablet TAKE 1 TABLET BY MOUTH ONCE DAILY FOR ALLERGIES   omeprazole (PRILOSEC) 20 MG capsule Take 1 capsule (20 mg total) by mouth daily.   ondansetron (ZOFRAN) 4 MG tablet Take 4 mg by mouth every 8 (eight) hours as needed for nausea/vomiting.   polyethylene glycol (MIRALAX / GLYCOLAX) 17 g packet Take 17 g by mouth daily.   No facility-administered encounter medications on file as of 05/29/2021.    Current COPD regimen:  Breo ellipta 1 puff daily Duo nebulizer 1 puff 4 times daily as needed No flowsheet data  found.  Any recent hospitalizations or ED visits since last visit with CPP? No  denies COPD symptoms  What recent interventions/DTPs have been made by any provider to improve breathing since last visit: Counseled on Proper inhaler technique; And that Albuterol is PRN, not daily Counseled on Inhaler technique   Have you had exacerbation/flare-up since last visit? No  What do you do when you are short of breath?  Rescue medication  Current tobacco use? No  Respiratory Devices/Equipment Do you have a nebulizer? Yes Do you use a Peak Flow Meter? No Do you use a maintenance inhaler? Yes How often do you forget to use your daily inhaler? Patient states never. Do you use a rescue inhaler? Yes How often do you use your rescue inhaler?  1-2x per week Do you use a spacer with your inhaler? No  Adherence Review: Does the patient have >5 day gap between last estimated fill date for maintenance inhaler medications? CPP to review  Care Gaps: Last annual wellness visit? None. Sent message to [facility] to schedule.  Star Rating Drugs: None  [place] Clinical Pharmacist Assistant [phone]

## 2021-06-02 ENCOUNTER — Encounter: Payer: Self-pay | Admitting: Physician Assistant

## 2021-06-02 ENCOUNTER — Other Ambulatory Visit: Payer: Self-pay

## 2021-06-02 ENCOUNTER — Ambulatory Visit (INDEPENDENT_AMBULATORY_CARE_PROVIDER_SITE_OTHER): Payer: Medicare Other | Admitting: Physician Assistant

## 2021-06-02 VITALS — BP 128/80 | HR 65 | Temp 97.0°F | Wt 141.0 lb

## 2021-06-02 DIAGNOSIS — L309 Dermatitis, unspecified: Secondary | ICD-10-CM

## 2021-06-02 MED ORDER — TRIAMCINOLONE ACETONIDE 40 MG/ML IJ SUSP: 40.0000 mg | Freq: Once | INTRAMUSCULAR | Status: DC

## 2021-06-02 MED ORDER — TRIAMCINOLONE ACETONIDE 0.1 % EX CREA: 1.0000 "application " | TOPICAL_CREAM | Freq: Two times a day (BID) | CUTANEOUS | 0 refills | Status: DC

## 2021-06-02 MED ORDER — CEPHALEXIN 500 MG PO CAPS: 500.0000 mg | ORAL_CAPSULE | Freq: Four times a day (QID) | ORAL | 0 refills | Status: DC

## 2021-06-02 MED ORDER — PREDNISONE 20 MG PO TABS: ORAL_TABLET | ORAL | 0 refills | Status: AC

## 2021-06-02 NOTE — Progress Notes (Signed)
Acute Office Visit  Subjective:    Patient ID: Tina Jimenez, female    DOB: [birth year], 80 y.o.   MRN: [id number]  Chief Complaint  Patient presents with   Rash    On both hands    HPI: Patient is in today for complaints of dry, cracking, peeling red palms for the past 2 weeks - states blistering and itchy as well Denies involvement anywhere else on body - no rash on feet  Past Medical History:  Diagnosis Date   Abdominal pain 10/13/2019   Abnormal blood chemistry 08/22/2019   Acute blood loss anemia 06/22/2019   Acute bronchitis with COPD ([place]) 08/15/2019   Acute cystitis with hematuria 10/26/2019   Acute hypoxemic respiratory failure ([place]) 07/08/2019   Acute pain of left shoulder 10/26/2019   Acute pulmonary embolism without acute cor pulmonale ([place]) 06/21/2019   Adnexal mass    Anemia 01/08/2020   Arm DVT (deep venous thromboembolism), acute, left (HCC) 06/26/2019   Atrial fibrillation (HCC)    Bladder mass 10/26/2019   Compression fracture of body of thoracic vertebra ([place]) 2020   COPD (chronic obstructive pulmonary disease) (HCC)    Coronary artery calcification seen on CT scan 05/23/2020   Dependence on supplemental oxygen 04/14/2020   GERD (gastroesophageal reflux disease)    History of pulmonary embolism 10/02/2019   Hyperlipemia    Hypertension 08/15/2019   Localized edema 08/22/2019   Mixed dyslipidemia 05/23/2020   Osteoporosis    Other fatigue 01/08/2020   Oxygen deficiency    Paroxysmal atrial fibrillation (HCC) 06/20/2019   Persistent atrial fibrillation ([place]) 08/22/2019   Pneumonia 05/2019   COVID Pneumonia   Pneumonia due to COVID-19 virus 07/08/2019   Precordial pain 06/20/2019   Pulmonary embolism ([place]) 05/2019   Spinal stenosis of lumbar region 06/14/2019   Vitamin D deficiency 06/22/2019    Past Surgical History:  Procedure Laterality Date   APPENDECTOMY     KYPHOPLASTY      Family History  Problem Relation Age of Onset   Breast cancer Sister    Lung  cancer Sister    Lung cancer Daughter     Social History   Socioeconomic History   Marital status: Widowed    Spouse name: Not on file   Number of children: 4   Years of education: Not on file   Highest education level: Not on file  Occupational History   Occupation: retired  Tobacco Use   Smoking status: Former    Types: Cigarettes    Quit date: 11/01/2018    Years since quitting: 2.5   Smokeless tobacco: Never  Vaping Use   Vaping Use: Never used  Substance and Sexual Activity   Alcohol use: Never   Drug use: Never   Sexual activity: Not on file  Other Topics Concern   Not on file  Social History Narrative   Not on file   Social Determinants of Health   Financial Resource Strain: Not on file  Food Insecurity: No Food Insecurity   Worried About Charity fundraiser in the Last Year: Never true   [place] in the Last Year: Never true  Transportation Needs: Unmet Transportation Needs   Lack of Transportation (Medical): Yes   Lack of Transportation (Non-Medical): Yes  Physical Activity: Not on file  Stress: Not on file  Social Connections: Not on file  Intimate Partner Violence: Not on file    Outpatient Medications Prior to Visit  Medication Sig Dispense  Refill   Ascorbic Acid (VITAMIN C) 500 MG CAPS Take 1 tablet by mouth daily.     aspirin EC 81 MG tablet Take 81 mg by mouth every other day. Swallow whole.     aspirin-acetaminophen-caffeine (EXCEDRIN MIGRAINE) 250-250-65 MG tablet Take 1-2 tablets by mouth every 6 (six) hours as needed for headache.     Bempedoic Acid-Ezetimibe (NEXLIZET) 180-10 MG TABS Take 1 tablet by mouth daily in the afternoon. 90 tablet 1   BREO ELLIPTA 100-25 MCG/INH AEPB INHALE 1 PUFF BY MOUTH EVERY DAY 60 each 5   carvedilol (COREG) 6.25 MG tablet TAKE 1 TABLET BY MOUTH TWICE A DAY 180 tablet 0   Co-Enzyme Q-10 100 MG CAPS Take 100 mg by mouth daily.     digoxin (LANOXIN) 0.125 MG tablet TAKE 1 TABLET BY MOUTH EVERY DAY 90 tablet  2   famotidine (PEPCID) 20 MG tablet Take 20 mg by mouth daily.     ferrous sulfate 325 (65 FE) MG tablet TAKE 1 TABLET BY MOUTH EVERY DAY WITH BREAKFAST 90 tablet 1   fluticasone (FLONASE) 50 MCG/ACT nasal spray Place 2 sprays into both nostrils daily.     ipratropium-albuterol (DUONEB) 0.5-2.5 (3) MG/3ML SOLN USE 1 NEBULE IN NEBULIZER EVERY MORNING, NOON, EVENING, AND BEDTIME. 360 mL 1   loratadine (CLARITIN) 10 MG tablet TAKE 1 TABLET BY MOUTH ONCE DAILY FOR ALLERGIES 90 tablet 1   omeprazole (PRILOSEC) 20 MG capsule Take 1 capsule (20 mg total) by mouth daily. 90 capsule 1   ondansetron (ZOFRAN) 4 MG tablet Take 4 mg by mouth every 8 (eight) hours as needed for nausea/vomiting.     polyethylene glycol (MIRALAX / GLYCOLAX) 17 g packet Take 17 g by mouth daily.     No facility-administered medications prior to visit.    Allergies  Allergen Reactions   Multaq [Dronedarone] Diarrhea   Klonopin [Clonazepam] Other (See Comments)    Pt can't remember reaction   Lipitor [Atorvastatin] Other (See Comments)    Pt could barely lift arms   Simvastatin Other (See Comments)    Pt can't remember reaction    Review of Systems CONSTITUTIONAL: Negative for chills, fatigue, fever, unintentional weight gain and unintentional weight loss.  E/N/T: Negative for ear pain, nasal congestion and sore throat.  CARDIOVASCULAR: Negative for chest pain, dizziness, palpitations and pedal edema.  RESPIRATORY: Negative for recent cough and dyspnea.  INTEGUMENTARY: see HPI      Objective:    Physical Exam PHYSICAL EXAM:   VS: BP 128/80 (BP Location: Left Arm, Patient Position: Sitting, Cuff Size: Normal)    Pulse 65    Temp (!) 97 F (36.1 C) (Temporal)    Wt 141 lb (64 kg)    SpO2 (!) 9%    BMI 24.98 kg/m   GEN: Well nourished, well developed, in no acute distress - in wheelchair Cardiac: RRR; no murmurs, Respiratory:  normal respiratory rate and pattern with no distress - normal breath sounds with no  rales, rhonchi, wheezes or rubs Skin: dry, cracked peeling skin on both hands with erythema and blisters   BP 128/80 (BP Location: Left Arm, Patient Position: Sitting, Cuff Size: Normal)    Pulse 65    Temp (!) 97 F (36.1 C) (Temporal)    Wt 141 lb (64 kg)    SpO2 (!) 9%    BMI 24.98 kg/m  Wt Readings from Last 3 Encounters:  06/02/21 141 lb (64 kg)  03/17/21 137 lb 3.2 oz (62.2  kg)  01/03/21 137 lb (62.1 kg)    Health Maintenance Due  Topic Date Due   Pneumonia Vaccine 67+ Years old (1 - PCV) Never done   COVID-19 Vaccine (3 - Pfizer risk series) 05/30/2020    There are no preventive care reminders to display for this patient.   Lab Results  Component Value Date   TSH 2.360 03/17/2021   Lab Results  Component Value Date   WBC 11.1 (H) 03/17/2021   HGB 14.4 03/17/2021   HCT 44.5 03/17/2021   MCV 92 03/17/2021   PLT 222 03/17/2021   Lab Results  Component Value Date   NA 143 03/17/2021   K 5.2 03/17/2021   CO2 33 (H) 03/17/2021   GLUCOSE 98 03/17/2021   BUN 12 03/17/2021   CREATININE 0.60 03/17/2021   BILITOT 0.4 03/17/2021   ALKPHOS 97 03/17/2021   AST 21 03/17/2021   ALT 14 03/17/2021   PROT 6.7 03/17/2021   ALBUMIN 4.5 03/17/2021   CALCIUM 9.9 03/17/2021   ANIONGAP 7 10/14/2019   EGFR 92 03/17/2021   Lab Results  Component Value Date   CHOL 141 03/17/2021   Lab Results  Component Value Date   HDL 45 03/17/2021   Lab Results  Component Value Date   LDLCALC 77 03/17/2021   Lab Results  Component Value Date   TRIG 105 03/17/2021   Lab Results  Component Value Date   CHOLHDL 3.1 03/17/2021   No results found for: HGBA1C     Assessment & Plan:   Problem List Items Addressed This Visit   None Visit Diagnoses     Hand eczema    -  Primary   Relevant Medications   triamcinolone acetonide (KENALOG-40) injection 40 mg (Start on 06/02/2021  4:00 PM)   predniSONE (DELTASONE) 20 MG tablet   cephALEXin (KEFLEX) 500 MG capsule   triamcinolone  cream (KENALOG) 0.1 %      Meds ordered this encounter  Medications   triamcinolone acetonide (KENALOG-40) injection 40 mg   predniSONE (DELTASONE) 20 MG tablet    Sig: Take 3 tablets (60 mg total) by mouth daily with breakfast for 3 days, THEN 2 tablets (40 mg total) daily with breakfast for 3 days, THEN 1 tablet (20 mg total) daily with breakfast for 3 days.    Dispense:  18 tablet    Refill:  0    Order Specific Question:   Supervising Provider    Answer:   Shelton Silvas   cephALEXin (KEFLEX) 500 MG capsule    Sig: Take 1 capsule (500 mg total) by mouth 4 (four) times daily.    Dispense:  20 capsule    Refill:  0    Order Specific Question:   Supervising Provider    Answer:   Shelton Silvas   triamcinolone cream (KENALOG) 0.1 %    Sig: Apply 1 application topically 2 (two) times daily.    Dispense:  30 g    Refill:  0    Order Specific Question:   Supervising Provider    Answer:   Shelton Silvas    No orders of the defined types were placed in this encounter.    Follow-up: Return if symptoms worsen or fail to improve.  An After Visit Summary was printed and given to the patient.  Yetta Flock [facility] [phone]

## 2021-06-10 DIAGNOSIS — J449 Chronic obstructive pulmonary disease, unspecified: Secondary | ICD-10-CM | POA: Diagnosis not present

## 2021-06-13 ENCOUNTER — Other Ambulatory Visit: Payer: Self-pay | Admitting: Family Medicine

## 2021-06-13 ENCOUNTER — Other Ambulatory Visit: Payer: Self-pay | Admitting: Physician Assistant

## 2021-06-13 DIAGNOSIS — L309 Dermatitis, unspecified: Secondary | ICD-10-CM

## 2021-06-14 ENCOUNTER — Other Ambulatory Visit: Payer: Self-pay | Admitting: Legal Medicine

## 2021-06-14 DIAGNOSIS — L309 Dermatitis, unspecified: Secondary | ICD-10-CM

## 2021-06-14 DIAGNOSIS — J449 Chronic obstructive pulmonary disease, unspecified: Secondary | ICD-10-CM | POA: Diagnosis not present

## 2021-06-14 MED ORDER — TRIAMCINOLONE ACETONIDE 0.1 % EX CREA: TOPICAL_CREAM | CUTANEOUS | 0 refills | Status: DC

## 2021-06-14 MED ORDER — CEPHALEXIN 500 MG PO CAPS: 500.0000 mg | ORAL_CAPSULE | Freq: Four times a day (QID) | ORAL | 0 refills | Status: DC

## 2021-06-24 ENCOUNTER — Telehealth: Payer: Self-pay

## 2021-06-24 NOTE — Chronic Care Management (AMB) (Signed)
°  Tina Jimenez was reminded to have all medications, supplements and any blood glucose and blood pressure readings available for review with Arizona Constable, Pharm. D, at her telephone visit on 07-01-2021 at 2:00.   Questions: Have you had any recent office visit or specialist visit outside of [facility]? Patient stated no  Are there any concerns you would like to discuss during your office visit? Patient stated she is having issues filling nebulizer for 90 day supply. Patient normally get 12 boxes and only received 6.  Are you having any problems obtaining your medications? (Whether it pharmacy issues or cost) Patient stated no   If patient has any PAP medications ask if they are having any problems getting their PAP medication or refill? No PAP medications  NOTES: Contacted [facility] and was told nebulizer was put in for a 90 days supply but patient only received a 30 day supply. [facility] is filling the rest of medication. Updated patient.  Care Gaps: PNA Vac overdue Shingrix overdue  Star Rating Drug: None  Any gaps in medications fill history? No  [facility] Pharmacist Assistant [phone]

## 2021-07-01 ENCOUNTER — Ambulatory Visit (INDEPENDENT_AMBULATORY_CARE_PROVIDER_SITE_OTHER): Payer: Medicare Other

## 2021-07-01 ENCOUNTER — Other Ambulatory Visit: Payer: Self-pay

## 2021-07-01 VITALS — BP 130/70

## 2021-07-01 DIAGNOSIS — I4819 Other persistent atrial fibrillation: Secondary | ICD-10-CM

## 2021-07-01 DIAGNOSIS — E782 Mixed hyperlipidemia: Secondary | ICD-10-CM

## 2021-07-01 DIAGNOSIS — I10 Essential (primary) hypertension: Secondary | ICD-10-CM

## 2021-07-01 NOTE — Progress Notes (Signed)
Chronic Care Management Pharmacy Note  07/01/2021 Name:  Tina Jimenez MRN:  [id number] DOB:  30-Mar-1941   Update:  Nexlitol assistance expires Feb 2023, will complete paperwork to renew BP at goal  Subjective: Tina Jimenez is an 81 y.o. year old female who is a primary patient of Tina Jimenez, [place].  The CCM team was consulted for assistance with disease management and care coordination needs.    Engaged with patient by telephone for follow up visit in response to provider referral for pharmacy case management and/or care coordination services.   Consent to Services:  The patient was given information about Chronic Care Management services, agreed to services, and gave verbal consent prior to initiation of services.  Please see initial visit note for detailed documentation.   Patient Care Team: Tina Jimenez, Hershal Coria as PCP - General (Physician Assistant) Lane Hacker, [facility] as Pharmacist (Pharmacist)  Recent office visits: 07/11/2019 - stable labs.  Recent consult visits: 05/23/2020 - Cardiology - CAD - significant calcifications on CT scan. Zetia stopped and replaced with Nexlizet. Patient is intolerant to statin.  Hospital visits: None in previous 6 months  Objective:  Lab Results  Component Value Date   CREATININE 0.60 03/17/2021   BUN 12 03/17/2021   GFRNONAA 94 07/10/2020   GFRAA 108 07/10/2020   NA 143 03/17/2021   K 5.2 03/17/2021   CALCIUM 9.9 03/17/2021   CO2 33 (H) 03/17/2021    No results found for: HGBA1C, FRUCTOSAMINE, GFR, MICROALBUR  Last diabetic Eye exam: No results found for: HMDIABEYEEXA  Last diabetic Foot exam: No results found for: HMDIABFOOTEX   Lab Results  Component Value Date   CHOL 141 03/17/2021   HDL 45 03/17/2021   LDLCALC 77 03/17/2021   TRIG 105 03/17/2021   CHOLHDL 3.1 03/17/2021    Hepatic Function Latest Ref Rng & Units 03/17/2021 11/28/2020 07/10/2020  Total Protein 6.0 - 8.5 g/dL 6.7 6.3 6.6  Albumin 3.7 - 4.7  g/dL 4.5 3.9 4.2  AST 0 - 40 IU/L '21 13 16  ' ALT 0 - 32 IU/L '14 12 16  ' Alk Phosphatase 44 - 121 IU/L 97 90 112  Total Bilirubin 0.0 - 1.2 mg/dL 0.4 0.4 0.4    Lab Results  Component Value Date/Time   TSH 2.360 03/17/2021 10:25 AM   TSH 2.150 01/04/2020 10:20 AM    CBC Latest Ref Rng & Units 03/17/2021 11/28/2020 07/10/2020  WBC 3.4 - 10.8 x10E3/uL 11.1(H) 9.4 9.9  Hemoglobin 11.1 - 15.9 g/dL 14.4 12.1 12.6  Hematocrit 34.0 - 46.6 % 44.5 38.9 37.6  Platelets 150 - 450 x10E3/uL 222 217 200    Lab Results  Component Value Date/Time   VD25OH 15.6 (L) 03/17/2021 10:25 AM    Clinical ASCVD: Yes  The 10-year ASCVD risk score (Arnett DK, et al., 2019) is: 28.6%   Values used to calculate the score:     Age: 81 years     Sex: Female     Is Non-Hispanic African American: No     Diabetic: No     Tobacco smoker: No     Systolic Blood Pressure: 383 mmHg     Is BP treated: Yes     HDL Cholesterol: 45 mg/dL     Total Cholesterol: 141 mg/dL    Depression screen [facility] 2/9 05/21/2020 04/08/2020  Decreased Interest 0 0  Down, Depressed, Hopeless 0 0  PHQ - 2 Score 0 0     Other: (CHADS2VASc if Afib,  MMRC or CAT for COPD, ACT, DEXA)  Social History   Tobacco Use  Smoking Status Former   Types: Cigarettes   Quit date: 11/01/2018   Years since quitting: 2.6  Smokeless Tobacco Never   BP Readings from Last 3 Encounters:  07/01/21 130/70  06/02/21 128/80  03/17/21 (!) 142/84   Pulse Readings from Last 3 Encounters:  06/02/21 65  03/17/21 (!) 58  01/03/21 97   Wt Readings from Last 3 Encounters:  06/02/21 141 lb (64 kg)  03/17/21 137 lb 3.2 oz (62.2 kg)  01/03/21 137 lb (62.1 kg)    Assessment/Interventions: Review of patient past medical history, allergies, medications, health status, including review of consultants reports, laboratory and other test data, was performed as part of comprehensive evaluation and provision of chronic care management services.   SDOH:  (Social  Determinants of Health) assessments and interventions performed: Yes SDOH Interventions    Flowsheet Row Most Recent Value  SDOH Interventions   Financial Strain Interventions Other (Comment)  [PAP]       Care Plan : [place]  Updates made by Lane Hacker, [place] since 07/01/2021 12:00 AM     Problem: cad, hld, copd   Priority: High  Onset Date: 08/19/2020     Long-Range Goal: Disease Managment   Start Date: 08/19/2020  Expected End Date: 08/19/2021  Recent Progress: On track  Priority: High  Note:    Current Barriers:  Needs PAP renewed yearly  Pharmacist Clinical Goal(s):  Patient will contact provider office for questions/concerns as evidenced notation of same in electronic health record through collaboration with PharmD and provider.   Interventions: 1:1 collaboration with Tina Duncans, PA-C regarding development and update of comprehensive plan of care as evidenced by provider attestation and co-signature Inter-disciplinary care team collaboration (see longitudinal plan of care) Comprehensive medication review performed; medication list updated in electronic medical record  Cardio (BP goal <140/90) BP Readings from Last 3 Encounters:  07/01/21 130/70  06/02/21 128/80  03/17/21 (!) 142/84  -Uncontrolled -Current treatment: Carvedilol 6.65m Appropriate, Effective, Safe, Accessible Digoxin 0.1269mAppropriate, Effective, Safe, Accessible -Medications previously tried: N/A  -Current home readings: Doesn't take -Current dietary habits: (Patient didn't specify) -Current exercise habits: N/A due to mobility -Denies hypotensive/hypertensive symptoms -Educated on BP goals and benefits of medications for prevention of heart attack, stroke and kidney damage; -Counseled to monitor BP at home if possible, document, and provide log at future appointments -Counseled on diet and exercise extensively Sept 2022: Has appt with PCP next week, will defer to them since  will be able to get BP reading Jan 2023: BP at goal  Hyperlipidemia: (LDL goal < 100) -Controlled The 10-year ASCVD risk score (Arnett DK, et al., 2019) is: 28.6%   Values used to calculate the score:     Age: 89ears     Sex: Female     Is Non-Hispanic African American: No     Diabetic: No     Tobacco smoker: No     Systolic Blood Pressure: 13696mHg     Is BP treated: Yes     HDL Cholesterol: 45 mg/dL     Total Cholesterol: 141 mg/dL Lab Results  Component Value Date   CHOL 141 03/17/2021   CHOL 151 11/28/2020   CHOL 202 (H) 07/10/2020   Lab Results  Component Value Date   HDL 45 03/17/2021   HDL 52 11/28/2020   HDL 57 07/10/2020   Lab Results  Component Value  Date   LDLCALC 77 03/17/2021   LDLCALC 81 11/28/2020   LDLCALC 127 (H) 07/10/2020   Lab Results  Component Value Date   TRIG 105 03/17/2021   TRIG 100 11/28/2020   TRIG 98 07/10/2020   Lab Results  Component Value Date   CHOLHDL 3.1 03/17/2021   CHOLHDL 2.9 11/28/2020   CHOLHDL 3.5 07/10/2020  No results found for: LDLDIRECT -Current treatment: Nexlizet (PAP Feb 2023) Appropriate, Effective, Safe, Query accessible -Medications previously tried: Statin  -Current dietary patterns: Didn't specify -Current exercise habits: N/A due to mobility -Educated on Cholesterol goals;  -Counseled on diet and exercise extensively Jan 2023: Renew PAP for Feb 2023  COPD (Goal: control symptoms and prevent exacerbations) -Not ideally controlled -Current treatment  Breo Appropriate, Query effective, Safe, Accessible Albuterol TID (Using TID everyday) Appropriate, Query effective, Safe, Accessible O2 3L -Medications previously tried: N/A  -Patient reports consistent use of maintenance inhaler -Frequency of rescue inhaler use:   -Jan 2023: 3-4x/day -Counseled on Proper inhaler technique; Jan 2023: Patient using Albuterol 3-4x/day but is necessary due to constant O2 use. Per note from PCP, patient at goal and  controlled  Patient Goals/Self-Care Activities Patient will:  - take medications as prescribed  Follow Up Plan: Face to Face appointment with care management team member scheduled for: July 2023  Arizona Constable, Pharm.D. - [phone]       Follow Up Plan: Face to Face appointment with care management team member scheduled for: July 2023  Arizona Constable, [place].D. - [phone]

## 2021-07-01 NOTE — Patient Instructions (Signed)
Visit Information   Goals Addressed   None    Patient Care Plan: CCM Pharmacy Care Plan     Problem Identified: cad, hld, copd   Priority: High  Onset Date: 08/19/2020     Long-Range Goal: Disease Managment   Start Date: 08/19/2020  Expected End Date: 08/19/2021  Recent Progress: On track  Priority: High  Note:    Current Barriers:  Needs PAP renewed yearly  Pharmacist Clinical Goal(s):  Patient will contact provider office for questions/concerns as evidenced notation of same in electronic health record through collaboration with PharmD and provider.   Interventions: 1:1 collaboration with Marge Duncans, PA-C regarding development and update of comprehensive plan of care as evidenced by provider attestation and co-signature Inter-disciplinary care team collaboration (see longitudinal plan of care) Comprehensive medication review performed; medication list updated in electronic medical record  Cardio (BP goal <140/90) BP Readings from Last 3 Encounters:  07/01/21 130/70  06/02/21 128/80  03/17/21 (!) 142/84  -Uncontrolled -Current treatment: Carvedilol 6.25mg  Appropriate, Effective, Safe, Accessible Digoxin 0.125mg  Appropriate, Effective, Safe, Accessible -Medications previously tried: N/A  -Current home readings: Doesn't take -Current dietary habits: (Patient didn't specify) -Current exercise habits: N/A due to mobility -Denies hypotensive/hypertensive symptoms -Educated on BP goals and benefits of medications for prevention of heart attack, stroke and kidney damage; -Counseled to monitor BP at home if possible, document, and provide log at future appointments -Counseled on diet and exercise extensively Sept 2022: Has appt with PCP next week, will defer to them since will be able to get BP reading Jan 2023: BP at goal  Hyperlipidemia: (LDL goal < 100) -Controlled The 10-year ASCVD risk score (Arnett DK, et al., 2019) is: 28.6%   Values used to calculate the score:      Age: 49 years     Sex: Female     Is Non-Hispanic African American: No     Diabetic: No     Tobacco smoker: No     Systolic Blood Pressure: 096 mmHg     Is BP treated: Yes     HDL Cholesterol: 45 mg/dL     Total Cholesterol: 141 mg/dL Lab Results  Component Value Date   CHOL 141 03/17/2021   CHOL 151 11/28/2020   CHOL 202 (H) 07/10/2020   Lab Results  Component Value Date   HDL 45 03/17/2021   HDL 52 11/28/2020   HDL 57 07/10/2020   Lab Results  Component Value Date   LDLCALC 77 03/17/2021   LDLCALC 81 11/28/2020   LDLCALC 127 (H) 07/10/2020   Lab Results  Component Value Date   TRIG 105 03/17/2021   TRIG 100 11/28/2020   TRIG 98 07/10/2020   Lab Results  Component Value Date   CHOLHDL 3.1 03/17/2021   CHOLHDL 2.9 11/28/2020   CHOLHDL 3.5 07/10/2020  No results found for: LDLDIRECT -Current treatment: Nexlizet (PAP Feb 2023) Appropriate, Effective, Safe, Query accessible -Medications previously tried: Statin  -Current dietary patterns: Didn't specify -Current exercise habits: N/A due to mobility -Educated on Cholesterol goals;  -Counseled on diet and exercise extensively Jan 2023: Renew PAP for Feb 2023  COPD (Goal: control symptoms and prevent exacerbations) -Not ideally controlled -Current treatment  Breo Appropriate, Query effective, Safe, Accessible Albuterol TID (Using TID everyday) Appropriate, Query effective, Safe, Accessible O2 3L -Medications previously tried: N/A  -Patient reports consistent use of maintenance inhaler -Frequency of rescue inhaler use:   -Jan 2023: 3-4x/day -Counseled on Proper inhaler technique; Jan 2023: Patient using Albuterol 3-4x/day  but is necessary due to constant O2 use. Per note from PCP, patient at goal and controlled  Patient Goals/Self-Care Activities Patient will:  - take medications as prescribed  Follow Up Plan: Face to Face appointment with care management team member scheduled for: July 2023  [place] Constable, Pharm.D. - [phone]       The patient verbalized understanding of instructions, educational materials, and care plan provided today and declined offer to receive copy of patient instructions, educational materials, and care plan.  The pharmacy team will reach out to the patient again over the next 90 days.   Lane Hacker, [facility]

## 2021-07-04 ENCOUNTER — Telehealth: Payer: Self-pay

## 2021-07-04 NOTE — Chronic Care Management (AMB) (Signed)
° ° °  Chronic Care Management Pharmacy Assistant   Name: Tina Jimenez  MRN: [id number] DOB: Oct 02, 1940   Reason for Encounter: Patient Assistance Coordination  07/04/2021- Patient has [employer] Grant for Nexlizet 180/10 mg, checked [employer] portal to see when patient enrollment expires. Enrollment expires 07/19/2021, patients current grant still has funds available.  Re-enrolled patient in [employer]. Patient has been approved, enrollment starts 07/20/2021 and ends 07/19/2022.  Called patient to inform, patient aware on re-enrollment approval and funds that are still available on this current enrollment. Patient will contact the office if a new prescription is needed at the pharmacy next month. Mailing approval letter to patient.    Medications: Outpatient Encounter Medications as of 07/04/2021  Medication Sig   Ascorbic Acid (VITAMIN C) 500 MG CAPS Take 1 tablet by mouth daily.   aspirin EC 81 MG tablet Take 81 mg by mouth every other day. Swallow whole.   aspirin-acetaminophen-caffeine (EXCEDRIN MIGRAINE) 250-250-65 MG tablet Take 1-2 tablets by mouth every 6 (six) hours as needed for headache.   Bempedoic Acid-Ezetimibe (NEXLIZET) 180-10 MG TABS Take 1 tablet by mouth daily in the afternoon.   BREO ELLIPTA 100-25 MCG/INH AEPB INHALE 1 PUFF BY MOUTH EVERY DAY   carvedilol (COREG) 6.25 MG tablet TAKE 1 TABLET BY MOUTH TWICE A DAY   cephALEXin (KEFLEX) 500 MG capsule Take 1 capsule (500 mg total) by mouth 4 (four) times daily.   Co-Enzyme Q-10 100 MG CAPS Take 100 mg by mouth daily.   digoxin (LANOXIN) 0.125 MG tablet TAKE 1 TABLET BY MOUTH EVERY DAY   famotidine (PEPCID) 20 MG tablet Take 20 mg by mouth daily.   ferrous sulfate 325 (65 FE) MG tablet TAKE 1 TABLET BY MOUTH EVERY DAY WITH BREAKFAST   fluticasone (FLONASE) 50 MCG/ACT nasal spray SPRAY 2 SPRAYS INTO EACH NOSTRIL EVERY DAY   ipratropium-albuterol (DUONEB) 0.5-2.5 (3) MG/3ML SOLN USE 1  NEBULE IN NEBULIZER EVERY MORNING, NOON, EVENING, AND BEDTIME.   loratadine (CLARITIN) 10 MG tablet TAKE 1 TABLET BY MOUTH ONCE DAILY FOR ALLERGIES   omeprazole (PRILOSEC) 20 MG capsule Take 1 capsule (20 mg total) by mouth daily.   ondansetron (ZOFRAN) 4 MG tablet Take 4 mg by mouth every 8 (eight) hours as needed for nausea/vomiting.   polyethylene glycol (MIRALAX / GLYCOLAX) 17 g packet Take 17 g by mouth daily.   triamcinolone cream (KENALOG) 0.1 % APPLY TO AFFECTED AREA TWICE A DAY   Facility-Administered Encounter Medications as of 07/04/2021  Medication   triamcinolone acetonide (KENALOG-40) injection 40 mg   Pattricia Boss, [facility] Clinical Pharmacist Assistant [phone]

## 2021-07-11 DIAGNOSIS — J449 Chronic obstructive pulmonary disease, unspecified: Secondary | ICD-10-CM | POA: Diagnosis not present

## 2021-07-15 DIAGNOSIS — E782 Mixed hyperlipidemia: Secondary | ICD-10-CM

## 2021-07-15 DIAGNOSIS — I4819 Other persistent atrial fibrillation: Secondary | ICD-10-CM

## 2021-07-15 DIAGNOSIS — I10 Essential (primary) hypertension: Secondary | ICD-10-CM

## 2021-07-15 DIAGNOSIS — J449 Chronic obstructive pulmonary disease, unspecified: Secondary | ICD-10-CM | POA: Diagnosis not present

## 2021-07-21 ENCOUNTER — Other Ambulatory Visit: Payer: Self-pay | Admitting: Family Medicine

## 2021-07-21 ENCOUNTER — Other Ambulatory Visit: Payer: Self-pay | Admitting: Physician Assistant

## 2021-08-11 ENCOUNTER — Telehealth: Payer: Self-pay

## 2021-08-11 DIAGNOSIS — J449 Chronic obstructive pulmonary disease, unspecified: Secondary | ICD-10-CM | POA: Diagnosis not present

## 2021-08-11 NOTE — Chronic Care Management (AMB) (Signed)
Chronic Care Management Pharmacy Assistant   Name: DONASIA WIMES  MRN: [id number] DOB: Mar 01, 1941  Reason for Encounter: Disease State/ Hypertension  Recent office visits:  None  Recent consult visits:  None  Hospital visits:  None in previous 6 months  Medications: Outpatient Encounter Medications as of 08/11/2021  Medication Sig   Ascorbic Acid (VITAMIN C) 500 MG CAPS Take 1 tablet by mouth daily.   aspirin EC 81 MG tablet Take 81 mg by mouth every other day. Swallow whole.   aspirin-acetaminophen-caffeine (EXCEDRIN MIGRAINE) 250-250-65 MG tablet Take 1-2 tablets by mouth every 6 (six) hours as needed for headache.   Bempedoic Acid-Ezetimibe (NEXLIZET) 180-10 MG TABS Take 1 tablet by mouth daily in the afternoon.   BREO ELLIPTA 100-25 MCG/ACT AEPB INHALE 1 PUFF BY MOUTH EVERY DAY   carvedilol (COREG) 6.25 MG tablet TAKE 1 TABLET BY MOUTH TWICE A DAY   cephALEXin (KEFLEX) 500 MG capsule Take 1 capsule (500 mg total) by mouth 4 (four) times daily.   Co-Enzyme Q-10 100 MG CAPS Take 100 mg by mouth daily.   digoxin (LANOXIN) 0.125 MG tablet TAKE 1 TABLET BY MOUTH EVERY DAY   famotidine (PEPCID) 20 MG tablet Take 20 mg by mouth daily.   ferrous sulfate 325 (65 FE) MG tablet TAKE 1 TABLET BY MOUTH EVERY DAY WITH BREAKFAST   fluticasone (FLONASE) 50 MCG/ACT nasal spray SPRAY 2 SPRAYS INTO EACH NOSTRIL EVERY DAY   ipratropium-albuterol (DUONEB) 0.5-2.5 (3) MG/3ML SOLN USE 1 NEBULE IN NEBULIZER EVERY MORNING, NOON, EVENING, AND BEDTIME.   loratadine (CLARITIN) 10 MG tablet TAKE 1 TABLET BY MOUTH ONCE DAILY FOR ALLERGIES   omeprazole (PRILOSEC) 20 MG capsule Take 1 capsule (20 mg total) by mouth daily.   ondansetron (ZOFRAN) 4 MG tablet Take 4 mg by mouth every 8 (eight) hours as needed for nausea/vomiting.   polyethylene glycol (MIRALAX / GLYCOLAX) 17 g packet Take 17 g by mouth daily.   triamcinolone cream (KENALOG) 0.1 % APPLY TO AFFECTED AREA TWICE A DAY    Facility-Administered Encounter Medications as of 08/11/2021  Medication   triamcinolone acetonide (KENALOG-40) injection 40 mg    Recent Office Vitals: BP Readings from Last 3 Encounters:  07/01/21 130/70  06/02/21 128/80  03/17/21 (!) 142/84   Pulse Readings from Last 3 Encounters:  06/02/21 65  03/17/21 (!) 58  01/03/21 97    Wt Readings from Last 3 Encounters:  06/02/21 141 lb (64 kg)  03/17/21 137 lb 3.2 oz (62.2 kg)  01/03/21 137 lb (62.1 kg)     Kidney Function Lab Results  Component Value Date/Time   CREATININE 0.60 03/17/2021 10:25 AM   CREATININE 0.47 (L) 11/28/2020 03:58 PM   GFRNONAA 94 07/10/2020 10:25 AM   GFRAA 108 07/10/2020 10:25 AM    BMP Latest Ref Rng & Units 03/17/2021 11/28/2020 07/10/2020  Glucose 70 - 99 mg/dL 98 101(H) 102(H)  BUN 8 - 27 mg/dL 12 4(L) 9  Creatinine 0.57 - 1.00 mg/dL 0.60 0.47(L) 0.50(L)  BUN/Creat Ratio 12 - 28 20 9(L) 18  Sodium 134 - 144 mmol/L 143 143 142  Potassium 3.5 - 5.2 mmol/L 5.2 4.5 4.6  Chloride 96 - 106 mmol/L 97 97 97  CO2 20 - 29 mmol/L 33(H) 32(H) 30(H)  Calcium 8.7 - 10.3 mg/dL 9.9 9.5 9.6     Current antihypertensive regimen:  Carvedilol 6.25 mg daily Digoxin 0.125 mg daily  Patient verbally confirms she is taking the above medications as directed.  Yes  How often are you checking your Blood Pressure? 3-5x per week  she checks her blood pressure in the morning after taking her medication.  Current home BP readings: 124/70, 132/74 Wrist or arm cuff: wrist Caffeine intake: Patient states she dinks 1-2 cups of coffee Salt intake: Limited OTC medications including pseudoephedrine or NSAIDs? Patient stated Vitamin C, Excedrin and COQ10   Any readings above 180/120? No  What recent interventions/DTPs have been made by any provider to improve Blood Pressure control since last CPP Visit:  Educated on BP goals and benefits of medications for prevention of heart attack, stroke and kidney damage; -Counseled  to monitor BP at home if possible, document, and provide log at future appointments -Counseled on diet and exercise extensively  Any recent hospitalizations or ED visits since last visit with CPP? No  What diet changes have been made to improve Blood Pressure Control?  Patient states she has limited her salt intake, drinks plenty of water daily and eats fruits and vegetables.  What exercise is being done to improve your Blood Pressure Control?  Patient stated she walks around the house some and does leg exercises that were taught in rehab.  Adherence Review: Is the patient currently on ACE/ARB medication? No Does the patient have >5 day gap between last estimated fill dates? CPP to review  NOTES: Patient stated she received her Nexlizet last week. Reminded patient to document all blood pressure readings.  Care Gaps: Last annual wellness visit? None PNA Vac overdue Shingrix overdue  Star Rating Drugs: None  Jeannette How CMA Clinical Pharmacist Assistant [phone]

## 2021-08-12 DIAGNOSIS — J449 Chronic obstructive pulmonary disease, unspecified: Secondary | ICD-10-CM | POA: Diagnosis not present

## 2021-09-02 ENCOUNTER — Telehealth: Payer: Self-pay

## 2021-09-02 NOTE — Chronic Care Management (AMB) (Addendum)
Chronic Care Management Pharmacy Assistant   Name: JORDANNE ELSBURY  MRN: [id number] DOB: [birth year]  Reason for Encounter: Disease State/ Hypertension   Recent office visits:  None  Recent consult visits:  None  Hospital visits:  None in previous 6 months  Medications: Outpatient Encounter Medications as of 09/02/2021  Medication Sig   Ascorbic Acid (VITAMIN C) 500 MG CAPS Take 1 tablet by mouth daily.   aspirin EC 81 MG tablet Take 81 mg by mouth every other day. Swallow whole.   aspirin-acetaminophen-caffeine (EXCEDRIN MIGRAINE) 250-250-65 MG tablet Take 1-2 tablets by mouth every 6 (six) hours as needed for headache.   Bempedoic Acid-Ezetimibe (NEXLIZET) 180-10 MG TABS Take 1 tablet by mouth daily in the afternoon.   BREO ELLIPTA 100-25 MCG/ACT AEPB INHALE 1 PUFF BY MOUTH EVERY DAY   carvedilol (COREG) 6.25 MG tablet TAKE 1 TABLET BY MOUTH TWICE A DAY   cephALEXin (KEFLEX) 500 MG capsule Take 1 capsule (500 mg total) by mouth 4 (four) times daily.   Co-Enzyme Q-10 100 MG CAPS Take 100 mg by mouth daily.   digoxin (LANOXIN) 0.125 MG tablet TAKE 1 TABLET BY MOUTH EVERY DAY   famotidine (PEPCID) 20 MG tablet Take 20 mg by mouth daily.   ferrous sulfate 325 (65 FE) MG tablet TAKE 1 TABLET BY MOUTH EVERY DAY WITH BREAKFAST   fluticasone (FLONASE) 50 MCG/ACT nasal spray SPRAY 2 SPRAYS INTO EACH NOSTRIL EVERY DAY   ipratropium-albuterol (DUONEB) 0.5-2.5 (3) MG/3ML SOLN USE 1 NEBULE IN NEBULIZER EVERY MORNING, NOON, EVENING, AND BEDTIME.   loratadine (CLARITIN) 10 MG tablet TAKE 1 TABLET BY MOUTH ONCE DAILY FOR ALLERGIES   omeprazole (PRILOSEC) 20 MG capsule Take 1 capsule (20 mg total) by mouth daily.   ondansetron (ZOFRAN) 4 MG tablet Take 4 mg by mouth every 8 (eight) hours as needed for nausea/vomiting.   polyethylene glycol (MIRALAX / GLYCOLAX) 17 g packet Take 17 g by mouth daily.   triamcinolone cream (KENALOG) 0.1 % APPLY TO AFFECTED AREA TWICE A DAY    Facility-Administered Encounter Medications as of 09/02/2021  Medication   triamcinolone acetonide (KENALOG-40) injection 40 mg    Recent Office Vitals: BP Readings from Last 3 Encounters:  07/01/21 130/70  06/02/21 128/80  03/17/21 (!) 142/84   Pulse Readings from Last 3 Encounters:  06/02/21 65  03/17/21 (!) 58  01/03/21 97    Wt Readings from Last 3 Encounters:  06/02/21 141 lb (64 kg)  03/17/21 137 lb 3.2 oz (62.2 kg)  01/03/21 137 lb (62.1 kg)     Kidney Function Lab Results  Component Value Date/Time   CREATININE 0.60 03/17/2021 10:25 AM   CREATININE 0.47 (L) 11/28/2020 03:58 PM   GFRNONAA 94 07/10/2020 10:25 AM   GFRAA 108 07/10/2020 10:25 AM    BMP Latest Ref Rng & Units 03/17/2021 11/28/2020 07/10/2020  Glucose 70 - 99 mg/dL 98 101(H) 102(H)  BUN 8 - 27 mg/dL 12 4(L) 9  Creatinine 0.57 - 1.00 mg/dL 0.60 0.47(L) 0.50(L)  BUN/Creat Ratio 12 - 28 20 9(L) 18  Sodium 134 - 144 mmol/L 143 143 142  Potassium 3.5 - 5.2 mmol/L 5.2 4.5 4.6  Chloride 96 - 106 mmol/L 97 97 97  CO2 20 - 29 mmol/L 33(H) 32(H) 30(H)  Calcium 8.7 - 10.3 mg/dL 9.9 9.5 9.6     Current antihypertensive regimen:  Carvedilol 6.25 mg daily Digoxin 0.125 mg daily  Patient verbally confirms she is taking the above medications as  directed. Yes  How often are you checking your Blood Pressure? 3-5x per week  she checks her blood pressure in the morning after taking her medication.  Current home BP readings: 130/78, 128/74 Wrist or arm cuff: wrist Caffeine intake: Patient states she dinks 1-2 cups of coffee Salt intake: Limited OTC medications including pseudoephedrine or NSAIDs? Patient stated Vitamin C, Excedrin and COQ10   Any readings above 180/120? No  What recent interventions/DTPs have been made by any provider to improve Blood Pressure control since last CPP Visit:  Educated on BP goals and benefits of medications for prevention of heart attack, stroke and kidney damage; -Counseled  to monitor BP at home if possible, document, and provide log at future appointments -Counseled on diet and exercise extensively  Any recent hospitalizations or ED visits since last visit with CPP? No  What diet changes have been made to improve Blood Pressure Control?  Patient states she has limited her salt intake, drinks plenty of water daily and eats fruits and vegetables  What exercise is being done to improve your Blood Pressure Control?  Patient stated she walks around the house some and does leg exercises that were taught in rehab.   Adherence Review: Is the patient currently on ACE/ARB medication? No Does the patient have >5 day gap between last estimated fill dates? CPP to review  Care Gaps: Last annual wellness visit? None PNA Vac overdue Shingrix overdue  Star Rating Drugs: None  Jeannette How CMA Clinical Pharmacist Assistant [phone]

## 2021-09-08 DIAGNOSIS — J449 Chronic obstructive pulmonary disease, unspecified: Secondary | ICD-10-CM | POA: Diagnosis not present

## 2021-09-12 DIAGNOSIS — J449 Chronic obstructive pulmonary disease, unspecified: Secondary | ICD-10-CM | POA: Diagnosis not present

## 2021-09-14 ENCOUNTER — Other Ambulatory Visit: Payer: Self-pay | Admitting: Family Medicine

## 2021-09-14 DIAGNOSIS — K219 Gastro-esophageal reflux disease without esophagitis: Secondary | ICD-10-CM

## 2021-09-15 ENCOUNTER — Ambulatory Visit (INDEPENDENT_AMBULATORY_CARE_PROVIDER_SITE_OTHER): Payer: Medicare Other | Admitting: Physician Assistant

## 2021-09-15 ENCOUNTER — Encounter: Payer: Self-pay | Admitting: Physician Assistant

## 2021-09-15 ENCOUNTER — Other Ambulatory Visit: Payer: Self-pay | Admitting: Physician Assistant

## 2021-09-15 VITALS — BP 130/82 | Temp 97.9°F | Ht 63.0 in | Wt 143.0 lb

## 2021-09-15 DIAGNOSIS — N3000 Acute cystitis without hematuria: Secondary | ICD-10-CM

## 2021-09-15 DIAGNOSIS — I480 Paroxysmal atrial fibrillation: Secondary | ICD-10-CM | POA: Diagnosis not present

## 2021-09-15 DIAGNOSIS — E782 Mixed hyperlipidemia: Secondary | ICD-10-CM | POA: Diagnosis not present

## 2021-09-15 DIAGNOSIS — K219 Gastro-esophageal reflux disease without esophagitis: Secondary | ICD-10-CM

## 2021-09-15 DIAGNOSIS — J431 Panlobular emphysema: Secondary | ICD-10-CM | POA: Diagnosis not present

## 2021-09-15 DIAGNOSIS — I10 Essential (primary) hypertension: Secondary | ICD-10-CM | POA: Diagnosis not present

## 2021-09-15 DIAGNOSIS — E559 Vitamin D deficiency, unspecified: Secondary | ICD-10-CM | POA: Diagnosis not present

## 2021-09-15 LAB — POCT URINALYSIS DIP (CLINITEK)
Bilirubin, UA: NEGATIVE
Blood, UA: NEGATIVE
Glucose, UA: NEGATIVE mg/dL
Ketones, POC UA: NEGATIVE mg/dL
Nitrite, UA: NEGATIVE
POC PROTEIN,UA: NEGATIVE
Spec Grav, UA: 1.015 (ref 1.010–1.025)
Urobilinogen, UA: 0.2 E.U./dL
pH, UA: 7 (ref 5.0–8.0)

## 2021-09-15 MED ORDER — NITROFURANTOIN MONOHYD MACRO 100 MG PO CAPS
100.0000 mg | ORAL_CAPSULE | Freq: Two times a day (BID) | ORAL | 0 refills | Status: DC
Start: 2021-09-15 — End: 2021-11-18

## 2021-09-15 MED ORDER — FLUCONAZOLE 150 MG PO TABS: ORAL_TABLET | ORAL | 0 refills | Status: DC

## 2021-09-15 NOTE — Progress Notes (Signed)
? ?Subjective:  ?Patient ID: Tina Jimenez, female    DOB: Jan 19, 1941  Age: 81 y.o. MRN: [id number] ? ?Chief Complaint  ?Patient presents with  ? Hyperlipidemia  ? Hypertension  ? Gastroesophageal Reflux  ? ?  ?Tina Jimenez is a 81 year old Caucasian female here for f/u of hypertension, a-fib, and COPD.  ? ?HYPERTENSION ?Pt presents for follow up of hypertension.The patient is tolerating the medication well without side effects. Compliance with treatment has been good; including taking medication as directed , and following up as directed. ?Currently taking coreg 6.'25mg'$  bid ? ? ?ATRIAL-FIBRILLATION ?Diagnosed a-fib in Dec 2020 current treatment Digoxin 0.'125mg'$  daily. She is followed by cardiologist Dr Geraldo Pitter. She is not currently taking anticoagulants due to unsteady gait and previous back surgery.  .  She denies lightheadedness, syncope, or chest pain - she does have follow up appt with cardiology later this month ? ?COPD with O2 Dependence ?The patient has a history of COPD for over 5 years she quit smoking cigarettes of 2020.  She is O2 dependent on 3 L of oxygen continuously today she has been brought into the clinic in a wheelchair assisted by her adult son she denies current shortness of breath or chest pain vital signs were stable.  Oxygen saturation is 99%.  Her COPD is well controlled with Breo Ellipta and DuoNeb inhalers.  She is up to date on COVID vaccinations and influenza vaccination ?Pt refuses pulmonology consult ? ?GERD, Follow up: ? ?Pt states she is doing well on omeprazole and pepcid with no breakthrough symptoms ? ?Mixed hyperlipidemia  ?Pt presents with hyperlipidemia.  The patient is compliant with medications, maintains a low cholesterol diet , follows up as directed , and maintains an exercise regimen . The patient denies experiencing any hypercholesterolemia related symptoms. She is currently on nexlizet 180/10 qd ? ?Pt requests diflucan for vaginal itching ?Not able to get ua in  office ? ?  ?Current Outpatient Medications on File Prior to Visit  ?Medication Sig Dispense Refill  ? Ascorbic Acid (VITAMIN C) 500 MG CAPS Take 1 tablet by mouth daily.    ? aspirin EC 81 MG tablet Take 81 mg by mouth every other day. Swallow whole.    ? aspirin-acetaminophen-caffeine (EXCEDRIN MIGRAINE) 250-250-65 MG tablet Take 1-2 tablets by mouth every 6 (six) hours as needed for headache.    ? Bempedoic Acid-Ezetimibe (NEXLIZET) 180-10 MG TABS Take 1 tablet by mouth daily in the afternoon. 90 tablet 1  ? BREO ELLIPTA 100-25 MCG/ACT AEPB INHALE 1 PUFF BY MOUTH EVERY DAY 60 each 5  ? carvedilol (COREG) 6.25 MG tablet TAKE 1 TABLET BY MOUTH TWICE A DAY 180 tablet 0  ? Co-Enzyme Q-10 100 MG CAPS Take 100 mg by mouth daily.    ? digoxin (LANOXIN) 0.125 MG tablet TAKE 1 TABLET BY MOUTH EVERY DAY 90 tablet 2  ? famotidine (PEPCID) 20 MG tablet Take 20 mg by mouth daily.    ? ferrous sulfate 325 (65 FE) MG tablet TAKE 1 TABLET BY MOUTH EVERY DAY WITH BREAKFAST 90 tablet 1  ? fluticasone (FLONASE) 50 MCG/ACT nasal spray SPRAY 2 SPRAYS INTO EACH NOSTRIL EVERY DAY 48 mL 2  ? ipratropium-albuterol (DUONEB) 0.5-2.5 (3) MG/3ML SOLN USE 1 NEBULE IN NEBULIZER EVERY MORNING, NOON, EVENING, AND BEDTIME. 360 mL 1  ? loratadine (CLARITIN) 10 MG tablet TAKE 1 TABLET BY MOUTH ONCE DAILY FOR ALLERGIES 90 tablet 1  ? omeprazole (PRILOSEC) 20 MG capsule Take 1 capsule (20 mg total) by  mouth daily. 90 capsule 1  ? ondansetron (ZOFRAN) 4 MG tablet Take 4 mg by mouth every 8 (eight) hours as needed for nausea/vomiting.    ? polyethylene glycol (MIRALAX / GLYCOLAX) 17 g packet Take 17 g by mouth daily.    ? triamcinolone cream (KENALOG) 0.1 % APPLY TO AFFECTED AREA TWICE A DAY 30 g 0  ? ?Current Facility-Administered Medications on File Prior to Visit  ?Medication Dose Route Frequency Provider Last Rate Last Admin  ? triamcinolone acetonide (KENALOG-40) injection 40 mg  40 mg Intramuscular Once Marge Duncans, PA-C      ? ?Past Medical  History:  ?Diagnosis Date  ? Abdominal pain 10/13/2019  ? Abnormal blood chemistry 08/22/2019  ? Acute blood loss anemia 06/22/2019  ? Acute bronchitis with COPD ([place]) 08/15/2019  ? Acute cystitis with hematuria 10/26/2019  ? Acute hypoxemic respiratory failure ([place]) 07/08/2019  ? Acute pain of left shoulder 10/26/2019  ? Acute pulmonary embolism without acute cor pulmonale ([place]) 06/21/2019  ? Adnexal mass   ? Anemia 01/08/2020  ? Arm DVT (deep venous thromboembolism), acute, left ([place]) 06/26/2019  ? Atrial fibrillation ([place])   ? Bladder mass 10/26/2019  ? Compression fracture of body of thoracic vertebra ([place]) 2020  ? COPD (chronic obstructive pulmonary disease) ([place])   ? Coronary artery calcification seen on CT scan 05/23/2020  ? Dependence on supplemental oxygen 04/14/2020  ? GERD (gastroesophageal reflux disease)   ? History of pulmonary embolism 10/02/2019  ? Hyperlipemia   ? Hypertension 08/15/2019  ? Localized edema 08/22/2019  ? Mixed dyslipidemia 05/23/2020  ? Osteoporosis   ? Other fatigue 01/08/2020  ? Oxygen deficiency   ? Paroxysmal atrial fibrillation ([place]) 06/20/2019  ? Persistent atrial fibrillation ([place]) 08/22/2019  ? Pneumonia 05/2019  ? COVID Pneumonia  ? Pneumonia due to COVID-19 virus 07/08/2019  ? Precordial pain 06/20/2019  ? Pulmonary embolism ([place]) 05/2019  ? Spinal stenosis of lumbar region 06/14/2019  ? Vitamin D deficiency 06/22/2019  ? ?Past Surgical History:  ?Procedure Laterality Date  ? APPENDECTOMY    ? KYPHOPLASTY    ?  ?Family History  ?Problem Relation Age of Onset  ? Breast cancer Sister   ? Lung cancer Sister   ? Lung cancer Daughter   ? ?Social History  ? ?Socioeconomic History  ? Marital status: Widowed  ?  Spouse name: Not on file  ? Number of children: 4  ? Years of education: Not on file  ? Highest education level: Not on file  ?Occupational History  ? Occupation: retired  ?Tobacco Use  ? Smoking status: Former  ?  Types: Cigarettes  ?  Quit date: 11/01/2018  ?  Years since quitting: 2.8  ? Smokeless  tobacco: Never  ?Vaping Use  ? Vaping Use: Never used  ?Substance and Sexual Activity  ? Alcohol use: Never  ? Drug use: Never  ? Sexual activity: Not on file  ?Other Topics Concern  ? Not on file  ?Social History Narrative  ? Not on file  ? ?Social Determinants of Health  ? ?Financial Resource Strain: Medium Risk  ? Difficulty of Paying Living Expenses: Somewhat hard  ?Food Insecurity: Not on file  ?Transportation Needs: Unmet Transportation Needs  ? Lack of Transportation (Medical): Yes  ? Lack of Transportation (Non-Medical): Yes  ?Physical Activity: Not on file  ?Stress: Not on file  ?Social Connections: Not on file  ? ?CONSTITUTIONAL: Negative for chills, fatigue, fever, unintentional weight gain and unintentional weight loss.  ?  E/N/T: Negative for ear pain, nasal congestion and sore throat.  ?CARDIOVASCULAR: Negative for chest pain, dizziness, palpitations and pedal edema.  ?RESPIRATORY: Negative for recent cough and dyspnea.  ?GASTROINTESTINAL: Negative for abdominal pain, acid reflux symptoms, constipation, diarrhea, nausea and vomiting.  ?MSK: chronic back pain ?GU - see HPI ?INTEGUMENTARY: Negative for rash.  ?NEUROLOGICAL: Negative for dizziness and headaches.  ?PSYCHIATRIC: Negative for sleep disturbance and to question depression screen.  Negative for depression, negative for anhedonia.  ?   ? ? ? ?Objective:  ?PHYSICAL EXAM:  ? ?VS: BP 130/82   Temp 97.9 ?F (36.6 ?C)   Ht '5\' 3"'$  (1.6 m)   Wt 143 lb (64.9 kg)   BMI 25.33 kg/m?  ? ?GEN: Well nourished, well developed, in no acute distress  ?Cardiac: RRR; no murmurs, rubs, or gallops,no edema -  ?Respiratory:  normal respiratory rate and pattern with no distress - normal breath sounds with no rales, rhonchi, wheezes or rubs - using o2 with nasal cannula ?MS: no deformity or atrophy  ?Skin: warm and dry, no rash  ?Psych: euthymic mood, appropriate affect and demeanor ? ? ?  ? ?  ? ?  ?  ? ?Lab Results  ?Component Value Date  ? WBC 11.1 (H) 03/17/2021  ?  HGB 14.4 03/17/2021  ? HCT 44.5 03/17/2021  ? PLT 222 03/17/2021  ? GLUCOSE 98 03/17/2021  ? CHOL 141 03/17/2021  ? TRIG 105 03/17/2021  ? HDL 45 03/17/2021  ? [place] 77 03/17/2021  ? ALT 14 03/17/2021  ? AST 21 10/03/

## 2021-09-15 NOTE — Addendum Note (Signed)
Addended by: Billie Lade C on: 09/15/2021 11:50 AM ? ? Modules accepted: Orders ? ?

## 2021-09-16 ENCOUNTER — Other Ambulatory Visit: Payer: Self-pay | Admitting: Physician Assistant

## 2021-09-16 LAB — COMPREHENSIVE METABOLIC PANEL
ALT: 16 IU/L (ref 0–32)
AST: 25 IU/L (ref 0–40)
Albumin/Globulin Ratio: 1.9 (ref 1.2–2.2)
Albumin: 4.3 g/dL (ref 3.7–4.7)
Alkaline Phosphatase: 74 IU/L (ref 44–121)
BUN/Creatinine Ratio: 16 (ref 12–28)
BUN: 9 mg/dL (ref 8–27)
Bilirubin Total: 0.3 mg/dL (ref 0.0–1.2)
CO2: 35 mmol/L — ABNORMAL HIGH (ref 20–29)
Calcium: 10.3 mg/dL (ref 8.7–10.3)
Chloride: 91 mmol/L — ABNORMAL LOW (ref 96–106)
Creatinine, Ser: 0.56 mg/dL — ABNORMAL LOW (ref 0.57–1.00)
Globulin, Total: 2.3 g/dL (ref 1.5–4.5)
Glucose: 103 mg/dL — ABNORMAL HIGH (ref 70–99)
Potassium: 5.3 mmol/L — ABNORMAL HIGH (ref 3.5–5.2)
Sodium: 140 mmol/L (ref 134–144)
Total Protein: 6.6 g/dL (ref 6.0–8.5)
eGFR: 93 mL/min/{1.73_m2} (ref >59)

## 2021-09-16 LAB — CBC WITH DIFFERENTIAL/PLATELET
Basophils Absolute: 0.1 10*3/uL (ref 0.0–0.2)
Basos: 1 %
EOS (ABSOLUTE): 0.4 10*3/uL (ref 0.0–0.4)
Eos: 3 %
Hematocrit: 41.4 % (ref 34.0–46.6)
Hemoglobin: 13.9 g/dL (ref 11.1–15.9)
Immature Grans (Abs): 0 10*3/uL (ref 0.0–0.1)
Immature Granulocytes: 0 %
Lymphocytes Absolute: 1.1 10*3/uL (ref 0.7–3.1)
Lymphs: 10 %
MCH: 30.8 pg (ref 26.6–33.0)
MCHC: 33.6 g/dL (ref 31.5–35.7)
MCV: 92 fL (ref 79–97)
Monocytes Absolute: 0.8 10*3/uL (ref 0.1–0.9)
Monocytes: 8 %
Neutrophils Absolute: 8 10*3/uL — ABNORMAL HIGH (ref 1.4–7.0)
Neutrophils: 78 %
Platelets: 205 10*3/uL (ref 150–450)
RBC: 4.52 x10E6/uL (ref 3.77–5.28)
RDW: 11.9 % (ref 11.7–15.4)
WBC: 10.4 10*3/uL (ref 3.4–10.8)

## 2021-09-16 LAB — LIPID PANEL
Chol/HDL Ratio: 2.9 ratio (ref 0.0–4.4)
Cholesterol, Total: 156 mg/dL (ref 100–199)
HDL: 53 mg/dL (ref >39)
LDL Chol Calc (NIH): 86 mg/dL (ref 0–99)
Triglycerides: 91 mg/dL (ref 0–149)
VLDL Cholesterol Cal: 17 mg/dL (ref 5–40)

## 2021-09-16 LAB — CARDIOVASCULAR RISK ASSESSMENT

## 2021-09-16 LAB — TSH: TSH: 1.7 u[IU]/mL (ref 0.450–4.500)

## 2021-09-16 LAB — VITAMIN D 25 HYDROXY (VIT D DEFICIENCY, FRACTURES): Vit D, 25-Hydroxy: 21.1 ng/mL — ABNORMAL LOW (ref 30.0–100.0)

## 2021-09-16 MED ORDER — FLUTICASONE FUROATE-VILANTEROL 200-25 MCG/ACT IN AEPB: 1.0000 | INHALATION_SPRAY | Freq: Every day | RESPIRATORY_TRACT | 5 refills | Status: DC

## 2021-09-17 LAB — URINE CULTURE

## 2021-09-30 ENCOUNTER — Other Ambulatory Visit: Payer: Self-pay

## 2021-09-30 MED ORDER — IPRATROPIUM-ALBUTEROL 0.5-2.5 (3) MG/3ML IN SOLN: RESPIRATORY_TRACT | 1 refills | Status: DC

## 2021-10-01 ENCOUNTER — Other Ambulatory Visit: Payer: Self-pay | Admitting: Cardiology

## 2021-10-01 ENCOUNTER — Other Ambulatory Visit: Payer: Self-pay | Admitting: Physician Assistant

## 2021-10-01 ENCOUNTER — Encounter: Payer: Self-pay | Admitting: Cardiology

## 2021-10-01 ENCOUNTER — Ambulatory Visit: Payer: Medicare Other | Admitting: Cardiology

## 2021-10-01 VITALS — BP 148/88 | HR 84 | Ht 63.0 in | Wt 145.0 lb

## 2021-10-01 DIAGNOSIS — I2510 Atherosclerotic heart disease of native coronary artery without angina pectoris: Secondary | ICD-10-CM | POA: Diagnosis not present

## 2021-10-01 DIAGNOSIS — E782 Mixed hyperlipidemia: Secondary | ICD-10-CM | POA: Diagnosis not present

## 2021-10-01 DIAGNOSIS — I10 Essential (primary) hypertension: Secondary | ICD-10-CM

## 2021-10-01 DIAGNOSIS — I480 Paroxysmal atrial fibrillation: Secondary | ICD-10-CM

## 2021-10-01 NOTE — Patient Instructions (Signed)

## 2021-10-01 NOTE — Progress Notes (Signed)
?Cardiology Office Note:   ? ?Date:  10/01/2021  ? ?ID:  Tina Jimenez, DOB 1941/06/11, MRN [id number] ? ?PCP:  Marge Duncans, PA-C  ?Cardiologist:  Jenean Lindau, MD  ? ?Referring MD: Marge Duncans, PA-C  ? ? ?ASSESSMENT:   ? ?1. Essential (primary) hypertension   ?2. Paroxysmal atrial fibrillation (HCC)   ?3. Coronary artery calcification seen on CT scan   ?4. Mixed dyslipidemia   ? ?PLAN:   ? ?In order of problems listed above: ? ?Coronary artery calcification seen on CT scan: Secondary prevention stressed with the patient.  Importance of compliance with diet medication stressed and she vocalized understanding. ?Essential hypertension: Blood pressure stable and diet was emphasized.  Lifestyle modification urged.  Her blood pressures are fine at home and she told me her numbers. ?Mixed dyslipidemia: Lipids were reviewed.  Patient is on ?Statin tolerating well.  Lipids followed by primary care. ?Patient will be seen in follow-up appointment in 12 months or earlier if the patient has any concerns ? ? ? ?Medication Adjustments/Labs and Tests Ordered: ?Current medicines are reviewed at length with the patient today.  Concerns regarding medicines are outlined above.  ?No orders of the defined types were placed in this encounter. ? ?No orders of the defined types were placed in this encounter. ? ? ? ?No chief complaint on file. ?  ? ?History of Present Illness:   ? ?Tina Jimenez is a 81 y.o. female.  Patient has past medical history of coronary artery calcification found on CT scan, history of atrial fibrillation, paroxysmal in nature, essential hypertension and COPD on supplemental oxygen.  She is accompanied by her son and brought in a wheelchair.  She is not stable on her feet.  She is a frail lady.  No chest pain orthopnea or PND.  At the time of my evaluation, the patient is alert awake oriented and in no distress. ? ?Past Medical History:  ?Diagnosis Date  ? Abdominal pain 10/13/2019  ? Abnormal blood  chemistry 08/22/2019  ? Acute blood loss anemia 06/22/2019  ? Acute bronchitis with COPD ([place]) 08/15/2019  ? Acute cystitis with hematuria 10/26/2019  ? Acute embolism and thrombosis of deep veins of left upper extremity ([place]) 07/08/2019  ? Acute hypoxemic respiratory failure ([place]) 07/08/2019  ? Acute pain of left shoulder 10/26/2019  ? Acute posthemorrhagic anemia 07/08/2019  ? Acute pulmonary embolism without acute cor pulmonale ([place]) 06/21/2019  ? Adnexal mass   ? Anemia 01/08/2020  ? Arm DVT (deep venous thromboembolism), acute, left ([place]) 06/26/2019  ? Atrial fibrillation ([place])   ? Bladder mass 10/26/2019  ? Compression fracture of body of thoracic vertebra ([place]) 2020  ? COPD (chronic obstructive pulmonary disease) ([place])   ? Coronary artery calcification seen on CT scan 05/23/2020  ? COVID-19 07/08/2019  ? Dependence on supplemental oxygen 04/14/2020  ? Effusion, left ankle 08/03/2019  ? Effusion, left foot 08/03/2019  ? Effusion, right ankle 08/03/2019  ? Effusion, right foot 08/03/2019  ? Elevated white blood cell count, unspecified 07/11/2019  ? Encounter for other orthopedic aftercare 07/08/2019  ? Essential (primary) hypertension 07/08/2019  ? Gastro-esophageal reflux disease without esophagitis 07/08/2019  ? GERD (gastroesophageal reflux disease)   ? History of pulmonary embolism 10/02/2019  ? Hyperlipemia   ? Hypertension 08/15/2019  ? Localized edema 08/22/2019  ? Mixed dyslipidemia 05/23/2020  ? Muscle weakness (generalized) 07/08/2019  ? Osteoporosis   ? Other disorders of electrolyte and fluid balance, not elsewhere classified 08/10/2019  ?  Other fatigue 01/08/2020  ? Oxygen deficiency   ? Paroxysmal atrial fibrillation ([place]) 06/20/2019  ? Persistent atrial fibrillation ([place]) 08/22/2019  ? Pneumonia 05/2019  ? COVID Pneumonia  ? Pneumonia due to COVID-19 virus 07/08/2019  ? Precordial pain 06/20/2019  ? Pulmonary embolism ([place]) 05/2019  ? Spinal stenosis of lumbar region 06/14/2019  ? Spinal stenosis, lumbar region without neurogenic  claudication 06/14/2019  ? Unsteadiness on feet 07/08/2019  ? Vitamin D deficiency 06/22/2019  ? Wedge compression fracture of unspecified lumbar vertebra, subsequent encounter for fracture with routine healing 07/08/2019  ? ? ?Past Surgical History:  ?Procedure Laterality Date  ? APPENDECTOMY    ? KYPHOPLASTY    ? ? ?Current Medications: ?Current Meds  ?Medication Sig  ? Ascorbic Acid (VITAMIN C) 500 MG CAPS Take 1 tablet by mouth daily.  ? aspirin EC 81 MG tablet Take 81 mg by mouth every other day. Swallow whole.  ? aspirin-acetaminophen-caffeine (EXCEDRIN MIGRAINE) 250-250-65 MG tablet Take 1-2 tablets by mouth every 6 (six) hours as needed for headache.  ? Bempedoic Acid-Ezetimibe (NEXLIZET) 180-10 MG TABS Take 1 tablet by mouth daily in the afternoon.  ? carvedilol (COREG) 6.25 MG tablet TAKE 1 TABLET BY MOUTH TWICE A DAY  ? Co-Enzyme Q-10 100 MG CAPS Take 100 mg by mouth daily.  ? digoxin (LANOXIN) 0.125 MG tablet TAKE 1 TABLET BY MOUTH EVERY DAY  ? famotidine (PEPCID) 20 MG tablet Take 20 mg by mouth daily.  ? ferrous sulfate 325 (65 FE) MG tablet TAKE 1 TABLET BY MOUTH EVERY DAY WITH BREAKFAST  ? fluconazole (DIFLUCAN) 150 MG tablet Take 1 po and may repeat once in 5 days if needed  ? fluticasone (FLONASE) 50 MCG/ACT nasal spray SPRAY 2 SPRAYS INTO EACH NOSTRIL EVERY DAY  ? fluticasone furoate-vilanterol (BREO ELLIPTA) 200-25 MCG/ACT AEPB Inhale 1 puff into the lungs daily.  ? ipratropium-albuterol (DUONEB) 0.5-2.5 (3) MG/3ML SOLN USE 1 NEBULE IN NEBULIZER EVERY MORNING, NOON, EVENING, AND BEDTIME.  ? loratadine (CLARITIN) 10 MG tablet TAKE 1 TABLET BY MOUTH ONCE DAILY FOR ALLERGIES  ? nitrofurantoin, macrocrystal-monohydrate, (MACROBID) 100 MG capsule Take 1 capsule (100 mg total) by mouth 2 (two) times daily.  ? omeprazole (PRILOSEC) 20 MG capsule TAKE 1 CAPSULE BY MOUTH EVERY DAY  ? ondansetron (ZOFRAN) 4 MG tablet Take 4 mg by mouth every 8 (eight) hours as needed for nausea/vomiting.  ? polyethylene glycol  (MIRALAX / GLYCOLAX) 17 g packet Take 17 g by mouth daily.  ? triamcinolone cream (KENALOG) 0.1 % APPLY TO AFFECTED AREA TWICE A DAY  ? VITAMIN D PO Take 1 tablet by mouth daily.  ? ?Current Facility-Administered Medications for the 10/01/21 encounter (Office Visit) with Veronica Fretz, Reita Cliche, MD  ?Medication  ? triamcinolone acetonide (KENALOG-40) injection 40 mg  ?  ? ?Allergies:   Multaq [dronedarone], Klonopin [clonazepam], Lipitor [atorvastatin], and Simvastatin  ? ?Social History  ? ?Socioeconomic History  ? Marital status: Widowed  ?  Spouse name: Not on file  ? Number of children: 4  ? Years of education: Not on file  ? Highest education level: Not on file  ?Occupational History  ? Occupation: retired  ?Tobacco Use  ? Smoking status: Former  ?  Types: Cigarettes  ?  Quit date: 11/01/2018  ?  Years since quitting: 2.9  ? Smokeless tobacco: Never  ?Vaping Use  ? Vaping Use: Never used  ?Substance and Sexual Activity  ? Alcohol use: Never  ? Drug use: Never  ?  Sexual activity: Not on file  ?Other Topics Concern  ? Not on file  ?Social History Narrative  ? Not on file  ? ?Social Determinants of Health  ? ?Financial Resource Strain: Medium Risk  ? Difficulty of Paying Living Expenses: Somewhat hard  ?Food Insecurity: Not on file  ?Transportation Needs: Unmet Transportation Needs  ? Lack of Transportation (Medical): Yes  ? Lack of Transportation (Non-Medical): Yes  ?Physical Activity: Not on file  ?Stress: Not on file  ?Social Connections: Not on file  ?  ? ?Family History: ?The patient's family history includes Breast cancer in her sister; Lung cancer in her daughter and sister. ? ?ROS:   ?Please see the history of present illness.    ?All other systems reviewed and are negative. ? ?EKGs/Labs/Other Studies Reviewed:   ? ?The following studies were reviewed today: ?I discussed my findings with the patient at length ? ? ?Recent Labs: ?09/15/2021: ALT 16; BUN 9; Creatinine, Ser 0.56; Hemoglobin 13.9; Platelets 205;  Potassium 5.3; Sodium 140; TSH 1.700  ?Recent Lipid Panel ?   ?Component Value Date/Time  ? CHOL 156 09/15/2021 1048  ? TRIG 91 09/15/2021 1048  ? HDL 53 09/15/2021 1048  ? CHOLHDL 2.9 09/15/2021 1048  ? LDLCALC 86 04

## 2021-10-03 ENCOUNTER — Other Ambulatory Visit: Payer: Self-pay

## 2021-10-03 MED ORDER — IPRATROPIUM-ALBUTEROL 0.5-2.5 (3) MG/3ML IN SOLN: RESPIRATORY_TRACT | 0 refills | Status: DC

## 2021-10-09 DIAGNOSIS — J449 Chronic obstructive pulmonary disease, unspecified: Secondary | ICD-10-CM | POA: Diagnosis not present

## 2021-10-12 DIAGNOSIS — J449 Chronic obstructive pulmonary disease, unspecified: Secondary | ICD-10-CM | POA: Diagnosis not present

## 2021-10-23 DIAGNOSIS — I4891 Unspecified atrial fibrillation: Secondary | ICD-10-CM | POA: Diagnosis not present

## 2021-10-23 DIAGNOSIS — J441 Chronic obstructive pulmonary disease with (acute) exacerbation: Secondary | ICD-10-CM | POA: Diagnosis not present

## 2021-10-23 DIAGNOSIS — R0789 Other chest pain: Secondary | ICD-10-CM | POA: Diagnosis not present

## 2021-10-23 DIAGNOSIS — I110 Hypertensive heart disease with heart failure: Secondary | ICD-10-CM | POA: Diagnosis not present

## 2021-10-23 DIAGNOSIS — J439 Emphysema, unspecified: Secondary | ICD-10-CM | POA: Diagnosis not present

## 2021-10-23 DIAGNOSIS — Z743 Need for continuous supervision: Secondary | ICD-10-CM | POA: Diagnosis not present

## 2021-10-23 DIAGNOSIS — I509 Heart failure, unspecified: Secondary | ICD-10-CM | POA: Diagnosis not present

## 2021-10-23 DIAGNOSIS — I491 Atrial premature depolarization: Secondary | ICD-10-CM | POA: Diagnosis not present

## 2021-10-23 DIAGNOSIS — I700 Atherosclerosis of aorta: Secondary | ICD-10-CM | POA: Diagnosis not present

## 2021-10-23 DIAGNOSIS — R079 Chest pain, unspecified: Secondary | ICD-10-CM | POA: Diagnosis not present

## 2021-10-23 DIAGNOSIS — I4811 Longstanding persistent atrial fibrillation: Secondary | ICD-10-CM | POA: Diagnosis not present

## 2021-10-24 DIAGNOSIS — Z7982 Long term (current) use of aspirin: Secondary | ICD-10-CM | POA: Diagnosis not present

## 2021-10-24 DIAGNOSIS — I110 Hypertensive heart disease with heart failure: Secondary | ICD-10-CM | POA: Diagnosis not present

## 2021-10-24 DIAGNOSIS — Z9981 Dependence on supplemental oxygen: Secondary | ICD-10-CM | POA: Diagnosis not present

## 2021-10-24 DIAGNOSIS — Z7952 Long term (current) use of systemic steroids: Secondary | ICD-10-CM | POA: Diagnosis not present

## 2021-10-24 DIAGNOSIS — Z792 Long term (current) use of antibiotics: Secondary | ICD-10-CM | POA: Diagnosis not present

## 2021-10-24 DIAGNOSIS — Z8616 Personal history of COVID-19: Secondary | ICD-10-CM | POA: Diagnosis not present

## 2021-10-24 DIAGNOSIS — I10 Essential (primary) hypertension: Secondary | ICD-10-CM | POA: Diagnosis not present

## 2021-10-24 DIAGNOSIS — J439 Emphysema, unspecified: Secondary | ICD-10-CM | POA: Diagnosis not present

## 2021-10-24 DIAGNOSIS — M1990 Unspecified osteoarthritis, unspecified site: Secondary | ICD-10-CM | POA: Diagnosis not present

## 2021-10-24 DIAGNOSIS — Z79899 Other long term (current) drug therapy: Secondary | ICD-10-CM | POA: Diagnosis not present

## 2021-10-24 DIAGNOSIS — J441 Chronic obstructive pulmonary disease with (acute) exacerbation: Secondary | ICD-10-CM | POA: Diagnosis not present

## 2021-10-24 DIAGNOSIS — J9611 Chronic respiratory failure with hypoxia: Secondary | ICD-10-CM | POA: Diagnosis not present

## 2021-10-24 DIAGNOSIS — I509 Heart failure, unspecified: Secondary | ICD-10-CM | POA: Diagnosis not present

## 2021-10-24 DIAGNOSIS — J209 Acute bronchitis, unspecified: Secondary | ICD-10-CM | POA: Diagnosis not present

## 2021-10-24 DIAGNOSIS — Z87891 Personal history of nicotine dependence: Secondary | ICD-10-CM | POA: Diagnosis not present

## 2021-10-24 DIAGNOSIS — I4811 Longstanding persistent atrial fibrillation: Secondary | ICD-10-CM | POA: Diagnosis not present

## 2021-10-24 DIAGNOSIS — Z20822 Contact with and (suspected) exposure to covid-19: Secondary | ICD-10-CM | POA: Diagnosis not present

## 2021-10-24 DIAGNOSIS — Z888 Allergy status to other drugs, medicaments and biological substances status: Secondary | ICD-10-CM | POA: Diagnosis not present

## 2021-10-24 DIAGNOSIS — E871 Hypo-osmolality and hyponatremia: Secondary | ICD-10-CM | POA: Diagnosis not present

## 2021-10-24 DIAGNOSIS — E878 Other disorders of electrolyte and fluid balance, not elsewhere classified: Secondary | ICD-10-CM | POA: Diagnosis not present

## 2021-10-24 DIAGNOSIS — I700 Atherosclerosis of aorta: Secondary | ICD-10-CM | POA: Diagnosis not present

## 2021-10-24 DIAGNOSIS — R079 Chest pain, unspecified: Secondary | ICD-10-CM | POA: Diagnosis not present

## 2021-10-24 DIAGNOSIS — Z86711 Personal history of pulmonary embolism: Secondary | ICD-10-CM | POA: Diagnosis not present

## 2021-10-24 DIAGNOSIS — Z87440 Personal history of urinary (tract) infections: Secondary | ICD-10-CM | POA: Diagnosis not present

## 2021-10-24 DIAGNOSIS — J440 Chronic obstructive pulmonary disease with acute lower respiratory infection: Secondary | ICD-10-CM | POA: Diagnosis not present

## 2021-10-31 ENCOUNTER — Telehealth: Payer: Self-pay

## 2021-10-31 DIAGNOSIS — I700 Atherosclerosis of aorta: Secondary | ICD-10-CM | POA: Diagnosis not present

## 2021-10-31 DIAGNOSIS — E871 Hypo-osmolality and hyponatremia: Secondary | ICD-10-CM | POA: Diagnosis not present

## 2021-10-31 DIAGNOSIS — Z86711 Personal history of pulmonary embolism: Secondary | ICD-10-CM | POA: Diagnosis not present

## 2021-10-31 DIAGNOSIS — Z7952 Long term (current) use of systemic steroids: Secondary | ICD-10-CM | POA: Diagnosis not present

## 2021-10-31 DIAGNOSIS — D649 Anemia, unspecified: Secondary | ICD-10-CM | POA: Diagnosis not present

## 2021-10-31 DIAGNOSIS — I4891 Unspecified atrial fibrillation: Secondary | ICD-10-CM | POA: Diagnosis not present

## 2021-10-31 DIAGNOSIS — Z9049 Acquired absence of other specified parts of digestive tract: Secondary | ICD-10-CM | POA: Diagnosis not present

## 2021-10-31 DIAGNOSIS — M1990 Unspecified osteoarthritis, unspecified site: Secondary | ICD-10-CM | POA: Diagnosis not present

## 2021-10-31 DIAGNOSIS — Z79899 Other long term (current) drug therapy: Secondary | ICD-10-CM | POA: Diagnosis not present

## 2021-10-31 DIAGNOSIS — Z87440 Personal history of urinary (tract) infections: Secondary | ICD-10-CM | POA: Diagnosis not present

## 2021-10-31 DIAGNOSIS — I10 Essential (primary) hypertension: Secondary | ICD-10-CM | POA: Diagnosis not present

## 2021-10-31 DIAGNOSIS — J209 Acute bronchitis, unspecified: Secondary | ICD-10-CM | POA: Diagnosis not present

## 2021-10-31 DIAGNOSIS — J9611 Chronic respiratory failure with hypoxia: Secondary | ICD-10-CM | POA: Diagnosis not present

## 2021-10-31 DIAGNOSIS — Z7951 Long term (current) use of inhaled steroids: Secondary | ICD-10-CM | POA: Diagnosis not present

## 2021-10-31 DIAGNOSIS — J439 Emphysema, unspecified: Secondary | ICD-10-CM | POA: Diagnosis not present

## 2021-10-31 DIAGNOSIS — Z87891 Personal history of nicotine dependence: Secondary | ICD-10-CM | POA: Diagnosis not present

## 2021-10-31 DIAGNOSIS — Z9981 Dependence on supplemental oxygen: Secondary | ICD-10-CM | POA: Diagnosis not present

## 2021-10-31 NOTE — Chronic Care Management (AMB) (Signed)
Chronic Care Management Pharmacy Assistant   Name: ARIYA BOHANNON  MRN: [id number] DOB: [birth year]  Reason for Encounter: Disease State/ Hypertension  Recent office visits:  09-15-2021 Marge Duncans, PA-C. Neutrophils absolute= 8.0. Glucose= 103, Creatinine= 0.56, Potassium= 5.3, Chloride= 91, CO2= 35. Abnormal UA. Vitamin D= 21.1. STOP Keflex. START diflucan 150 mg Take 1 po and may repeat once in 5 days if needed.  Recent consult visits:  10-01-2021 Revankar, Reita Cliche, MD (Cardiology). No changes.   Hospital visits:  None in previous 6 months  Medications: Outpatient Encounter Medications as of 10/31/2021  Medication Sig   Ascorbic Acid (VITAMIN C) 500 MG CAPS Take 1 tablet by mouth daily.   aspirin EC 81 MG tablet Take 81 mg by mouth every other day. Swallow whole.   aspirin-acetaminophen-caffeine (EXCEDRIN MIGRAINE) 250-250-65 MG tablet Take 1-2 tablets by mouth every 6 (six) hours as needed for headache.   Bempedoic Acid-Ezetimibe (NEXLIZET) 180-10 MG TABS Take 1 tablet by mouth daily in the afternoon.   carvedilol (COREG) 6.25 MG tablet TAKE 1 TABLET BY MOUTH TWICE A DAY   Co-Enzyme Q-10 100 MG CAPS Take 100 mg by mouth daily.   digoxin (LANOXIN) 0.125 MG tablet Take 1 tablet (125 mcg total) by mouth daily.   famotidine (PEPCID) 20 MG tablet Take 20 mg by mouth daily.   ferrous sulfate 325 (65 FE) MG tablet TAKE 1 TABLET BY MOUTH EVERY DAY WITH BREAKFAST   fluconazole (DIFLUCAN) 150 MG tablet Take 1 po and may repeat once in 5 days if needed   fluticasone (FLONASE) 50 MCG/ACT nasal spray SPRAY 2 SPRAYS INTO EACH NOSTRIL EVERY DAY   fluticasone furoate-vilanterol (BREO ELLIPTA) 200-25 MCG/ACT AEPB Inhale 1 puff into the lungs daily.   ipratropium-albuterol (DUONEB) 0.5-2.5 (3) MG/3ML SOLN USE 1 NEBULE IN NEBULIZER EVERY MORNING, NOON, EVENING, AND BEDTIME.   loratadine (CLARITIN) 10 MG tablet TAKE 1 TABLET BY MOUTH ONCE DAILY FOR ALLERGIES   nitrofurantoin,  macrocrystal-monohydrate, (MACROBID) 100 MG capsule Take 1 capsule (100 mg total) by mouth 2 (two) times daily.   omeprazole (PRILOSEC) 20 MG capsule TAKE 1 CAPSULE BY MOUTH EVERY DAY   ondansetron (ZOFRAN) 4 MG tablet Take 4 mg by mouth every 8 (eight) hours as needed for nausea/vomiting.   polyethylene glycol (MIRALAX / GLYCOLAX) 17 g packet Take 17 g by mouth daily.   triamcinolone cream (KENALOG) 0.1 % APPLY TO AFFECTED AREA TWICE A DAY   VITAMIN D PO Take 1 tablet by mouth daily.   Facility-Administered Encounter Medications as of 10/31/2021  Medication   triamcinolone acetonide (KENALOG-40) injection 40 mg   Recent Office Vitals: BP Readings from Last 3 Encounters:  10/01/21 (!) 148/88  09/15/21 130/82  07/01/21 130/70   Pulse Readings from Last 3 Encounters:  10/01/21 84  06/02/21 65  03/17/21 (!) 58    Wt Readings from Last 3 Encounters:  10/01/21 145 lb (65.8 kg)  09/15/21 143 lb (64.9 kg)  06/02/21 141 lb (64 kg)     Kidney Function Lab Results  Component Value Date/Time   CREATININE 0.56 (L) 09/15/2021 10:48 AM   CREATININE 0.60 03/17/2021 10:25 AM   GFRNONAA 94 07/10/2020 10:25 AM   GFRAA 108 07/10/2020 10:25 AM       Latest Ref Rng & Units 09/15/2021   10:48 AM 03/17/2021   10:25 AM 11/28/2020    3:58 PM  BMP  Glucose 70 - 99 mg/dL 103   98   101  BUN 8 - 27 mg/dL '9   12   4    '$ Creatinine 0.57 - 1.00 mg/dL 0.56   0.60   0.47    BUN/Creat Ratio 12 - '28 16   20   9    '$ Sodium 134 - 144 mmol/L 140   143   143    Potassium 3.5 - 5.2 mmol/L 5.3   5.2   4.5    Chloride 96 - 106 mmol/L 91   97   97    CO2 20 - 29 mmol/L 35   33   32    Calcium 8.7 - 10.3 mg/dL 10.3   9.9   9.5       Current antihypertensive regimen:  Carvedilol 6.25 mg daily Digoxin 0.125 mg daily  Patient verbally confirms she is taking the above medications as directed. Yes  How often are you checking your Blood Pressure? 3-5x per week  she checks her blood pressure in the morning  after taking her medication.  Current home BP readings: none. Patient stated BP was elevated when she was admitted to the ER but was back normal (138/75) before dischargd. Wrist or arm cuff: wrist Caffeine intake:  Patient states she dinks 1-2 cups of coffee Salt intake: Limited OTC medications including pseudoephedrine or NSAIDs? Patient stated Vitamin C, Excedrin and COQ10   Any readings above 180/120? No  What recent interventions/DTPs have been made by any provider to improve Blood Pressure control since last CPP Visit:  Educated on BP goals and benefits of medications for prevention of heart attack, stroke and kidney damage; -Counseled to monitor BP at home if possible, document, and provide log at future appointments -Counseled on diet and exercise extensively   Any recent hospitalizations or ED visits since last visit with CPP? No  What diet changes have been made to improve Blood Pressure Control?  Patient states she has limited her salt intake, drinks plenty of water daily and eats fruits and vegetables  What exercise is being done to improve your Blood Pressure Control?  Patient stated she normally walks around the house some and does leg exercises that were taught in rehab. Patient has been taking things easy since coming home from the hospital.  NOTES: Patient stated she was in the hospital for 6 days due to a respiratory infection and just came home last week so she hasn't started back checking blood pressure but plans to start back soon. Patient reports feeling better.  Adherence Review: Is the patient currently on ACE/ARB medication? No Does the patient have >5 day gap between last estimated fill dates? CPP to review  Care Gaps: Last annual wellness visit? None   Star Rating Drugs: None  Jeannette How [facility] Clinical Pharmacist Assistant [phone]

## 2021-11-03 ENCOUNTER — Other Ambulatory Visit: Payer: Self-pay

## 2021-11-03 DIAGNOSIS — Z9049 Acquired absence of other specified parts of digestive tract: Secondary | ICD-10-CM | POA: Diagnosis not present

## 2021-11-03 DIAGNOSIS — M1990 Unspecified osteoarthritis, unspecified site: Secondary | ICD-10-CM | POA: Diagnosis not present

## 2021-11-03 DIAGNOSIS — I10 Essential (primary) hypertension: Secondary | ICD-10-CM | POA: Diagnosis not present

## 2021-11-03 DIAGNOSIS — I4891 Unspecified atrial fibrillation: Secondary | ICD-10-CM | POA: Diagnosis not present

## 2021-11-03 DIAGNOSIS — Z79899 Other long term (current) drug therapy: Secondary | ICD-10-CM | POA: Diagnosis not present

## 2021-11-03 DIAGNOSIS — Z7952 Long term (current) use of systemic steroids: Secondary | ICD-10-CM | POA: Diagnosis not present

## 2021-11-03 DIAGNOSIS — D649 Anemia, unspecified: Secondary | ICD-10-CM | POA: Diagnosis not present

## 2021-11-03 DIAGNOSIS — Z7951 Long term (current) use of inhaled steroids: Secondary | ICD-10-CM | POA: Diagnosis not present

## 2021-11-03 DIAGNOSIS — J209 Acute bronchitis, unspecified: Secondary | ICD-10-CM | POA: Diagnosis not present

## 2021-11-03 DIAGNOSIS — Z86711 Personal history of pulmonary embolism: Secondary | ICD-10-CM | POA: Diagnosis not present

## 2021-11-03 DIAGNOSIS — Z87891 Personal history of nicotine dependence: Secondary | ICD-10-CM | POA: Diagnosis not present

## 2021-11-03 DIAGNOSIS — J439 Emphysema, unspecified: Secondary | ICD-10-CM | POA: Diagnosis not present

## 2021-11-03 DIAGNOSIS — Z9981 Dependence on supplemental oxygen: Secondary | ICD-10-CM | POA: Diagnosis not present

## 2021-11-03 DIAGNOSIS — I700 Atherosclerosis of aorta: Secondary | ICD-10-CM | POA: Diagnosis not present

## 2021-11-03 DIAGNOSIS — Z87440 Personal history of urinary (tract) infections: Secondary | ICD-10-CM | POA: Diagnosis not present

## 2021-11-03 DIAGNOSIS — J9611 Chronic respiratory failure with hypoxia: Secondary | ICD-10-CM | POA: Diagnosis not present

## 2021-11-03 DIAGNOSIS — K219 Gastro-esophageal reflux disease without esophagitis: Secondary | ICD-10-CM

## 2021-11-03 DIAGNOSIS — E871 Hypo-osmolality and hyponatremia: Secondary | ICD-10-CM | POA: Diagnosis not present

## 2021-11-03 MED ORDER — OMEPRAZOLE 20 MG PO CPDR: DELAYED_RELEASE_CAPSULE | ORAL | 1 refills | Status: DC

## 2021-11-05 DIAGNOSIS — E871 Hypo-osmolality and hyponatremia: Secondary | ICD-10-CM | POA: Diagnosis not present

## 2021-11-05 DIAGNOSIS — J209 Acute bronchitis, unspecified: Secondary | ICD-10-CM | POA: Diagnosis not present

## 2021-11-05 DIAGNOSIS — Z87891 Personal history of nicotine dependence: Secondary | ICD-10-CM | POA: Diagnosis not present

## 2021-11-05 DIAGNOSIS — Z7952 Long term (current) use of systemic steroids: Secondary | ICD-10-CM | POA: Diagnosis not present

## 2021-11-05 DIAGNOSIS — D649 Anemia, unspecified: Secondary | ICD-10-CM | POA: Diagnosis not present

## 2021-11-05 DIAGNOSIS — I4891 Unspecified atrial fibrillation: Secondary | ICD-10-CM | POA: Diagnosis not present

## 2021-11-05 DIAGNOSIS — Z9981 Dependence on supplemental oxygen: Secondary | ICD-10-CM | POA: Diagnosis not present

## 2021-11-05 DIAGNOSIS — Z7951 Long term (current) use of inhaled steroids: Secondary | ICD-10-CM | POA: Diagnosis not present

## 2021-11-05 DIAGNOSIS — I10 Essential (primary) hypertension: Secondary | ICD-10-CM | POA: Diagnosis not present

## 2021-11-05 DIAGNOSIS — Z79899 Other long term (current) drug therapy: Secondary | ICD-10-CM | POA: Diagnosis not present

## 2021-11-05 DIAGNOSIS — J439 Emphysema, unspecified: Secondary | ICD-10-CM | POA: Diagnosis not present

## 2021-11-05 DIAGNOSIS — Z87440 Personal history of urinary (tract) infections: Secondary | ICD-10-CM | POA: Diagnosis not present

## 2021-11-05 DIAGNOSIS — I700 Atherosclerosis of aorta: Secondary | ICD-10-CM | POA: Diagnosis not present

## 2021-11-05 DIAGNOSIS — Z86711 Personal history of pulmonary embolism: Secondary | ICD-10-CM | POA: Diagnosis not present

## 2021-11-05 DIAGNOSIS — M1990 Unspecified osteoarthritis, unspecified site: Secondary | ICD-10-CM | POA: Diagnosis not present

## 2021-11-05 DIAGNOSIS — J9611 Chronic respiratory failure with hypoxia: Secondary | ICD-10-CM | POA: Diagnosis not present

## 2021-11-05 DIAGNOSIS — Z9049 Acquired absence of other specified parts of digestive tract: Secondary | ICD-10-CM | POA: Diagnosis not present

## 2021-11-05 NOTE — Telephone Encounter (Signed)
Patient states her legs are very swollen post discharge, this is a new problem as she has no diuretic meds on her list (She affirms). Wanted me to ask PCP about a fluid pill, sent msg to PCP  Patient states breathing is hard, inquired about an Albuterol puffer but she states she is unable to use a puffer due to her breathing difficulty so she's just using albuterol nebulizer QID and she prefers the nebulizer to the inhaler

## 2021-11-06 DIAGNOSIS — I10 Essential (primary) hypertension: Secondary | ICD-10-CM | POA: Diagnosis not present

## 2021-11-06 DIAGNOSIS — J209 Acute bronchitis, unspecified: Secondary | ICD-10-CM | POA: Diagnosis not present

## 2021-11-06 DIAGNOSIS — Z87891 Personal history of nicotine dependence: Secondary | ICD-10-CM | POA: Diagnosis not present

## 2021-11-06 DIAGNOSIS — Z86711 Personal history of pulmonary embolism: Secondary | ICD-10-CM | POA: Diagnosis not present

## 2021-11-06 DIAGNOSIS — Z79899 Other long term (current) drug therapy: Secondary | ICD-10-CM | POA: Diagnosis not present

## 2021-11-06 DIAGNOSIS — J439 Emphysema, unspecified: Secondary | ICD-10-CM | POA: Diagnosis not present

## 2021-11-06 DIAGNOSIS — Z87440 Personal history of urinary (tract) infections: Secondary | ICD-10-CM | POA: Diagnosis not present

## 2021-11-06 DIAGNOSIS — Z7951 Long term (current) use of inhaled steroids: Secondary | ICD-10-CM | POA: Diagnosis not present

## 2021-11-06 DIAGNOSIS — E871 Hypo-osmolality and hyponatremia: Secondary | ICD-10-CM | POA: Diagnosis not present

## 2021-11-06 DIAGNOSIS — Z7952 Long term (current) use of systemic steroids: Secondary | ICD-10-CM | POA: Diagnosis not present

## 2021-11-06 DIAGNOSIS — Z9981 Dependence on supplemental oxygen: Secondary | ICD-10-CM | POA: Diagnosis not present

## 2021-11-06 DIAGNOSIS — D649 Anemia, unspecified: Secondary | ICD-10-CM | POA: Diagnosis not present

## 2021-11-06 DIAGNOSIS — I4891 Unspecified atrial fibrillation: Secondary | ICD-10-CM | POA: Diagnosis not present

## 2021-11-06 DIAGNOSIS — Z9049 Acquired absence of other specified parts of digestive tract: Secondary | ICD-10-CM | POA: Diagnosis not present

## 2021-11-06 DIAGNOSIS — I700 Atherosclerosis of aorta: Secondary | ICD-10-CM | POA: Diagnosis not present

## 2021-11-06 DIAGNOSIS — M1990 Unspecified osteoarthritis, unspecified site: Secondary | ICD-10-CM | POA: Diagnosis not present

## 2021-11-06 DIAGNOSIS — J9611 Chronic respiratory failure with hypoxia: Secondary | ICD-10-CM | POA: Diagnosis not present

## 2021-11-06 NOTE — Telephone Encounter (Signed)
Spoke with patient. She is not having troubles breathing. She can become short of breath when she over does herself but she understands what she can do. Hospital made her aware she was drinking too much water in return not leaving enough sodium. Patient has been elevating feet as much as possible. Swelling begins in ankles, top of feet, and toes but is minimal at this time. Home Health nurse saw patient today and recommended she only drink 2 bottles of water instead of her normal 3 bottles and substitute the third for Gatorade. Patient will be doing this. Patient also has wound from lying in hospital. Described as redness and sore. Home Health will be following this as well.   Provider was made aware. Patient will begin recommendations from [place]. Appointment left for 6/5.   Tina Jimenez, [place] 11/06/21 2:16 PM

## 2021-11-06 NOTE — Telephone Encounter (Signed)
Patient wanted to tell me her sodium was low, she forgot to mention it yesterday.

## 2021-11-07 ENCOUNTER — Telehealth: Payer: Self-pay

## 2021-11-07 NOTE — Telephone Encounter (Signed)
Patient is requesting nebulizer order and paperwork be sent to [facility]. She has spoken with [facility] and they are requesting this so they can get this to her.  [name], [place] 11/07/21 8:59 AM

## 2021-11-07 NOTE — Telephone Encounter (Signed)
Pt will need to be seen in office to evaluate need for nebulizer and documentation --- if having trouble with breathing recommend to be seen at urgent care over weekend since our office closing in 10 min

## 2021-11-07 NOTE — Telephone Encounter (Signed)
I don't have any paperwork - please have [facility] send

## 2021-11-07 NOTE — Telephone Encounter (Signed)
Contacted [facility]. Patient reached out to [facility] requesting nebulizer. However [facility] has no record of supplying this previously to patient. They are needing an order and office notes. Patient has upcoming appointment on 6/5.   [name], [place] 11/07/21 10:29 AM

## 2021-11-08 DIAGNOSIS — J449 Chronic obstructive pulmonary disease, unspecified: Secondary | ICD-10-CM | POA: Diagnosis not present

## 2021-11-11 DIAGNOSIS — J9611 Chronic respiratory failure with hypoxia: Secondary | ICD-10-CM | POA: Diagnosis not present

## 2021-11-11 DIAGNOSIS — Z7952 Long term (current) use of systemic steroids: Secondary | ICD-10-CM | POA: Diagnosis not present

## 2021-11-11 DIAGNOSIS — I4891 Unspecified atrial fibrillation: Secondary | ICD-10-CM | POA: Diagnosis not present

## 2021-11-11 DIAGNOSIS — J439 Emphysema, unspecified: Secondary | ICD-10-CM | POA: Diagnosis not present

## 2021-11-11 DIAGNOSIS — Z9981 Dependence on supplemental oxygen: Secondary | ICD-10-CM | POA: Diagnosis not present

## 2021-11-11 DIAGNOSIS — J209 Acute bronchitis, unspecified: Secondary | ICD-10-CM | POA: Diagnosis not present

## 2021-11-11 DIAGNOSIS — Z87891 Personal history of nicotine dependence: Secondary | ICD-10-CM | POA: Diagnosis not present

## 2021-11-11 DIAGNOSIS — Z9049 Acquired absence of other specified parts of digestive tract: Secondary | ICD-10-CM | POA: Diagnosis not present

## 2021-11-11 DIAGNOSIS — Z87440 Personal history of urinary (tract) infections: Secondary | ICD-10-CM | POA: Diagnosis not present

## 2021-11-11 DIAGNOSIS — Z79899 Other long term (current) drug therapy: Secondary | ICD-10-CM | POA: Diagnosis not present

## 2021-11-11 DIAGNOSIS — I10 Essential (primary) hypertension: Secondary | ICD-10-CM | POA: Diagnosis not present

## 2021-11-11 DIAGNOSIS — Z7951 Long term (current) use of inhaled steroids: Secondary | ICD-10-CM | POA: Diagnosis not present

## 2021-11-11 DIAGNOSIS — M1990 Unspecified osteoarthritis, unspecified site: Secondary | ICD-10-CM | POA: Diagnosis not present

## 2021-11-11 DIAGNOSIS — Z86711 Personal history of pulmonary embolism: Secondary | ICD-10-CM | POA: Diagnosis not present

## 2021-11-11 DIAGNOSIS — I700 Atherosclerosis of aorta: Secondary | ICD-10-CM | POA: Diagnosis not present

## 2021-11-11 DIAGNOSIS — E871 Hypo-osmolality and hyponatremia: Secondary | ICD-10-CM | POA: Diagnosis not present

## 2021-11-11 DIAGNOSIS — D649 Anemia, unspecified: Secondary | ICD-10-CM | POA: Diagnosis not present

## 2021-11-12 ENCOUNTER — Telehealth: Payer: Self-pay

## 2021-11-12 DIAGNOSIS — J449 Chronic obstructive pulmonary disease, unspecified: Secondary | ICD-10-CM | POA: Diagnosis not present

## 2021-11-12 DIAGNOSIS — Z9981 Dependence on supplemental oxygen: Secondary | ICD-10-CM | POA: Diagnosis not present

## 2021-11-12 DIAGNOSIS — I4891 Unspecified atrial fibrillation: Secondary | ICD-10-CM | POA: Diagnosis not present

## 2021-11-12 DIAGNOSIS — Z86711 Personal history of pulmonary embolism: Secondary | ICD-10-CM | POA: Diagnosis not present

## 2021-11-12 DIAGNOSIS — J9611 Chronic respiratory failure with hypoxia: Secondary | ICD-10-CM | POA: Diagnosis not present

## 2021-11-12 DIAGNOSIS — D649 Anemia, unspecified: Secondary | ICD-10-CM | POA: Diagnosis not present

## 2021-11-12 DIAGNOSIS — J209 Acute bronchitis, unspecified: Secondary | ICD-10-CM | POA: Diagnosis not present

## 2021-11-12 DIAGNOSIS — Z7952 Long term (current) use of systemic steroids: Secondary | ICD-10-CM | POA: Diagnosis not present

## 2021-11-12 DIAGNOSIS — Z87440 Personal history of urinary (tract) infections: Secondary | ICD-10-CM | POA: Diagnosis not present

## 2021-11-12 DIAGNOSIS — M1990 Unspecified osteoarthritis, unspecified site: Secondary | ICD-10-CM | POA: Diagnosis not present

## 2021-11-12 DIAGNOSIS — E871 Hypo-osmolality and hyponatremia: Secondary | ICD-10-CM | POA: Diagnosis not present

## 2021-11-12 DIAGNOSIS — J439 Emphysema, unspecified: Secondary | ICD-10-CM | POA: Diagnosis not present

## 2021-11-12 DIAGNOSIS — I10 Essential (primary) hypertension: Secondary | ICD-10-CM | POA: Diagnosis not present

## 2021-11-12 DIAGNOSIS — N390 Urinary tract infection, site not specified: Secondary | ICD-10-CM | POA: Diagnosis not present

## 2021-11-12 DIAGNOSIS — Z79899 Other long term (current) drug therapy: Secondary | ICD-10-CM | POA: Diagnosis not present

## 2021-11-12 DIAGNOSIS — Z87891 Personal history of nicotine dependence: Secondary | ICD-10-CM | POA: Diagnosis not present

## 2021-11-12 DIAGNOSIS — Z9049 Acquired absence of other specified parts of digestive tract: Secondary | ICD-10-CM | POA: Diagnosis not present

## 2021-11-12 DIAGNOSIS — Z7951 Long term (current) use of inhaled steroids: Secondary | ICD-10-CM | POA: Diagnosis not present

## 2021-11-12 DIAGNOSIS — I700 Atherosclerosis of aorta: Secondary | ICD-10-CM | POA: Diagnosis not present

## 2021-11-12 NOTE — Telephone Encounter (Signed)
Ok to order 

## 2021-11-12 NOTE — Telephone Encounter (Signed)
Verbal orders given to Tiffany to obtain UA and urine culture at today's visit.   [name], [place] 11/12/21 9:55 AM

## 2021-11-12 NOTE — Telephone Encounter (Signed)
Tiffany nurse with [facility] left VM requesting orders for UA and urine culture. Patient is complaining of painful urination, increased urination and odor.  Please advise.  Call back: [phone]   Krimson Massmann G, [place] 11/12/21 9:24 AM

## 2021-11-13 ENCOUNTER — Telehealth: Payer: Self-pay

## 2021-11-13 DIAGNOSIS — Z7952 Long term (current) use of systemic steroids: Secondary | ICD-10-CM | POA: Diagnosis not present

## 2021-11-13 DIAGNOSIS — I700 Atherosclerosis of aorta: Secondary | ICD-10-CM | POA: Diagnosis not present

## 2021-11-13 DIAGNOSIS — J439 Emphysema, unspecified: Secondary | ICD-10-CM | POA: Diagnosis not present

## 2021-11-13 DIAGNOSIS — Z79899 Other long term (current) drug therapy: Secondary | ICD-10-CM | POA: Diagnosis not present

## 2021-11-13 DIAGNOSIS — Z9049 Acquired absence of other specified parts of digestive tract: Secondary | ICD-10-CM | POA: Diagnosis not present

## 2021-11-13 DIAGNOSIS — E871 Hypo-osmolality and hyponatremia: Secondary | ICD-10-CM | POA: Diagnosis not present

## 2021-11-13 DIAGNOSIS — N390 Urinary tract infection, site not specified: Secondary | ICD-10-CM | POA: Diagnosis not present

## 2021-11-13 DIAGNOSIS — Z87891 Personal history of nicotine dependence: Secondary | ICD-10-CM | POA: Diagnosis not present

## 2021-11-13 DIAGNOSIS — Z86711 Personal history of pulmonary embolism: Secondary | ICD-10-CM | POA: Diagnosis not present

## 2021-11-13 DIAGNOSIS — J209 Acute bronchitis, unspecified: Secondary | ICD-10-CM | POA: Diagnosis not present

## 2021-11-13 DIAGNOSIS — I10 Essential (primary) hypertension: Secondary | ICD-10-CM | POA: Diagnosis not present

## 2021-11-13 DIAGNOSIS — Z87440 Personal history of urinary (tract) infections: Secondary | ICD-10-CM | POA: Diagnosis not present

## 2021-11-13 DIAGNOSIS — Z7951 Long term (current) use of inhaled steroids: Secondary | ICD-10-CM | POA: Diagnosis not present

## 2021-11-13 DIAGNOSIS — J9611 Chronic respiratory failure with hypoxia: Secondary | ICD-10-CM | POA: Diagnosis not present

## 2021-11-13 DIAGNOSIS — Z9981 Dependence on supplemental oxygen: Secondary | ICD-10-CM | POA: Diagnosis not present

## 2021-11-13 DIAGNOSIS — M1990 Unspecified osteoarthritis, unspecified site: Secondary | ICD-10-CM | POA: Diagnosis not present

## 2021-11-13 DIAGNOSIS — I4891 Unspecified atrial fibrillation: Secondary | ICD-10-CM | POA: Diagnosis not present

## 2021-11-13 DIAGNOSIS — D649 Anemia, unspecified: Secondary | ICD-10-CM | POA: Diagnosis not present

## 2021-11-13 NOTE — Telephone Encounter (Signed)
Elmyra Ricks, nurse with [facility]. She collected specimen yesterday however patient would not use tophat. Elmyra Ricks faxed prelim results to office, she questions if specimen should be recollected. Patient complained to [facility] yesterday of needing to urinate however only being able to dribble and itching.   Please advise.  Callback: [phone] Harrell Lark 11/13/21 11:37 AM

## 2021-11-13 NOTE — Telephone Encounter (Signed)
Made Nicole aware. She will get this.   [name], [place] 11/13/21 2:40 PM

## 2021-11-17 ENCOUNTER — Inpatient Hospital Stay: Payer: Medicare Other | Admitting: Physician Assistant

## 2021-11-18 ENCOUNTER — Ambulatory Visit (INDEPENDENT_AMBULATORY_CARE_PROVIDER_SITE_OTHER): Payer: Medicare Other | Admitting: Physician Assistant

## 2021-11-18 ENCOUNTER — Encounter: Payer: Self-pay | Admitting: Physician Assistant

## 2021-11-18 ENCOUNTER — Telehealth: Payer: Self-pay

## 2021-11-18 VITALS — BP 158/90 | HR 96 | Temp 95.3°F | Resp 18 | Ht 63.0 in | Wt 142.0 lb

## 2021-11-18 DIAGNOSIS — Z79899 Other long term (current) drug therapy: Secondary | ICD-10-CM | POA: Diagnosis not present

## 2021-11-18 DIAGNOSIS — N3000 Acute cystitis without hematuria: Secondary | ICD-10-CM | POA: Diagnosis not present

## 2021-11-18 DIAGNOSIS — R899 Unspecified abnormal finding in specimens from other organs, systems and tissues: Secondary | ICD-10-CM | POA: Diagnosis not present

## 2021-11-18 DIAGNOSIS — I10 Essential (primary) hypertension: Secondary | ICD-10-CM

## 2021-11-18 DIAGNOSIS — J9611 Chronic respiratory failure with hypoxia: Secondary | ICD-10-CM | POA: Diagnosis not present

## 2021-11-18 DIAGNOSIS — J439 Emphysema, unspecified: Secondary | ICD-10-CM | POA: Diagnosis not present

## 2021-11-18 DIAGNOSIS — J209 Acute bronchitis, unspecified: Secondary | ICD-10-CM | POA: Diagnosis not present

## 2021-11-18 DIAGNOSIS — Z9981 Dependence on supplemental oxygen: Secondary | ICD-10-CM | POA: Diagnosis not present

## 2021-11-18 DIAGNOSIS — Z9049 Acquired absence of other specified parts of digestive tract: Secondary | ICD-10-CM | POA: Diagnosis not present

## 2021-11-18 DIAGNOSIS — J449 Chronic obstructive pulmonary disease, unspecified: Secondary | ICD-10-CM

## 2021-11-18 DIAGNOSIS — Z86711 Personal history of pulmonary embolism: Secondary | ICD-10-CM | POA: Diagnosis not present

## 2021-11-18 DIAGNOSIS — Z87440 Personal history of urinary (tract) infections: Secondary | ICD-10-CM | POA: Diagnosis not present

## 2021-11-18 DIAGNOSIS — I4891 Unspecified atrial fibrillation: Secondary | ICD-10-CM | POA: Diagnosis not present

## 2021-11-18 DIAGNOSIS — D649 Anemia, unspecified: Secondary | ICD-10-CM | POA: Diagnosis not present

## 2021-11-18 DIAGNOSIS — Z87891 Personal history of nicotine dependence: Secondary | ICD-10-CM | POA: Diagnosis not present

## 2021-11-18 DIAGNOSIS — E871 Hypo-osmolality and hyponatremia: Secondary | ICD-10-CM | POA: Diagnosis not present

## 2021-11-18 DIAGNOSIS — I700 Atherosclerosis of aorta: Secondary | ICD-10-CM | POA: Diagnosis not present

## 2021-11-18 DIAGNOSIS — Z7951 Long term (current) use of inhaled steroids: Secondary | ICD-10-CM | POA: Diagnosis not present

## 2021-11-18 DIAGNOSIS — M1990 Unspecified osteoarthritis, unspecified site: Secondary | ICD-10-CM | POA: Diagnosis not present

## 2021-11-18 DIAGNOSIS — Z7952 Long term (current) use of systemic steroids: Secondary | ICD-10-CM | POA: Diagnosis not present

## 2021-11-18 MED ORDER — NITROFURANTOIN MONOHYD MACRO 100 MG PO CAPS: 100.0000 mg | ORAL_CAPSULE | Freq: Two times a day (BID) | ORAL | 0 refills | Status: DC

## 2021-11-18 MED ORDER — FLUCONAZOLE 150 MG PO TABS: ORAL_TABLET | ORAL | 0 refills | Status: DC

## 2021-11-18 NOTE — Telephone Encounter (Signed)
Called [place], [place] Nurse to request a two week follow up of UA and urine culture. Left VM requesting call back regarding patient.   Elmyra Ricks contact #: [phone]  Harrell Lark 11/18/21 4:54 PM

## 2021-11-18 NOTE — Progress Notes (Signed)
Subjective:  Patient ID: Tina Jimenez, female    DOB: 1940/11/26  Age: 81 y.o. MRN: [id number]  Chief Complaint  Patient presents with   Hospitalization Follow-up    HPI  Follow up Hospitalization  Patient was admitted to [facility] on 10/24/21 and discharged on 10/29/21. She was treated for COPD with exacerbation, chronic respiratory failure with hypoxemia and hyponatremia. Treatment for this included IV fluids, prednisone and breathing treatments.  She reports good compliance with treatment. She reports this condition is improved  Pt had hyponatremia resulting from hydration therapy - discharge sodium was 125/chloride 84 -  Pt also states this week her home health nurse did a urine check and culture which was resulted today and has a positive culture - will treat   It is noted that pt had elevated BNP in hospital and complained of leg swelling - She states now she is not having any trouble with her breathing and having no swelling in lower extremities at all -   There was also a note in her chart about needing a new nebulizer however pt says she just got one in the past year and does not have a need for a nebulizer  Pt with history of hypertension - states bp has been slightly elevated for past few days - will have home health monitor over next week /two weeks and adjust medication accordingly.  Current Outpatient Medications on File Prior to Visit  Medication Sig Dispense Refill   Ascorbic Acid (VITAMIN C) 500 MG CAPS Take 1 tablet by mouth daily.     aspirin EC 81 MG tablet Take 81 mg by mouth every other day. Swallow whole.     aspirin-acetaminophen-caffeine (EXCEDRIN MIGRAINE) 250-250-65 MG tablet Take 1-2 tablets by mouth every 6 (six) hours as needed for headache.     Bempedoic Acid-Ezetimibe (NEXLIZET) 180-10 MG TABS Take 1 tablet by mouth daily in the afternoon. 90 tablet 1   carvedilol (COREG) 6.25 MG tablet TAKE 1 TABLET BY MOUTH TWICE A DAY 180 tablet 0    Co-Enzyme Q-10 100 MG CAPS Take 100 mg by mouth daily.     digoxin (LANOXIN) 0.125 MG tablet Take 1 tablet (125 mcg total) by mouth daily. 90 tablet 3   famotidine (PEPCID) 20 MG tablet Take 20 mg by mouth daily.     ferrous sulfate 325 (65 FE) MG tablet TAKE 1 TABLET BY MOUTH EVERY DAY WITH BREAKFAST 90 tablet 1   fluticasone (FLONASE) 50 MCG/ACT nasal spray SPRAY 2 SPRAYS INTO EACH NOSTRIL EVERY DAY 48 mL 2   fluticasone furoate-vilanterol (BREO ELLIPTA) 200-25 MCG/ACT AEPB Inhale 1 puff into the lungs daily. 1 each 5   ipratropium-albuterol (DUONEB) 0.5-2.5 (3) MG/3ML SOLN USE 1 NEBULE IN NEBULIZER EVERY MORNING, NOON, EVENING, AND BEDTIME. 1080 mL 0   loratadine (CLARITIN) 10 MG tablet TAKE 1 TABLET BY MOUTH ONCE DAILY FOR ALLERGIES 90 tablet 1   omeprazole (PRILOSEC) 20 MG capsule TAKE 1 CAPSULE BY MOUTH EVERY DAY 90 capsule 1   ondansetron (ZOFRAN) 4 MG tablet Take 4 mg by mouth every 8 (eight) hours as needed for nausea/vomiting.     polyethylene glycol (MIRALAX / GLYCOLAX) 17 g packet Take 17 g by mouth daily.     triamcinolone cream (KENALOG) 0.1 % APPLY TO AFFECTED AREA TWICE A DAY 30 g 0   VITAMIN D PO Take 1 tablet by mouth daily.     Current Facility-Administered Medications on File Prior to Visit  Medication Dose  Route Frequency Provider Last Rate Last Admin   triamcinolone acetonide (KENALOG-40) injection 40 mg  40 mg Intramuscular Once Marge Duncans, PA-C       Past Medical History:  Diagnosis Date   Abdominal pain 10/13/2019   Abnormal blood chemistry 08/22/2019   Acute blood loss anemia 06/22/2019   Acute bronchitis with COPD ([place]) 08/15/2019   Acute cystitis with hematuria 10/26/2019   Acute embolism and thrombosis of deep veins of left upper extremity ([place]) 07/08/2019   Acute hypoxemic respiratory failure ([place]) 07/08/2019   Acute pain of left shoulder 10/26/2019   Acute posthemorrhagic anemia 07/08/2019   Acute pulmonary embolism without acute cor pulmonale ([place]) 06/21/2019    Adnexal mass    Anemia 01/08/2020   Arm DVT (deep venous thromboembolism), acute, left (HCC) 06/26/2019   Atrial fibrillation (HCC)    Bladder mass 10/26/2019   Compression fracture of body of thoracic vertebra ([place]) 2020   COPD (chronic obstructive pulmonary disease) (HCC)    Coronary artery calcification seen on CT scan 05/23/2020   COVID-19 07/08/2019   Dependence on supplemental oxygen 04/14/2020   Effusion, left ankle 08/03/2019   Effusion, left foot 08/03/2019   Effusion, right ankle 08/03/2019   Effusion, right foot 08/03/2019   Elevated white blood cell count, unspecified 07/11/2019   Encounter for other orthopedic aftercare 07/08/2019   Essential (primary) hypertension 07/08/2019   Gastro-esophageal reflux disease without esophagitis 07/08/2019   GERD (gastroesophageal reflux disease)    History of pulmonary embolism 10/02/2019   Hyperlipemia    Hypertension 08/15/2019   Localized edema 08/22/2019   Mixed dyslipidemia 05/23/2020   Muscle weakness (generalized) 07/08/2019   Osteoporosis    Other disorders of electrolyte and fluid balance, not elsewhere classified 08/10/2019   Other fatigue 01/08/2020   Oxygen deficiency    Paroxysmal atrial fibrillation (HCC) 06/20/2019   Persistent atrial fibrillation ([place]) 08/22/2019   Pneumonia 05/2019   COVID Pneumonia   Pneumonia due to COVID-19 virus 07/08/2019   Precordial pain 06/20/2019   Pulmonary embolism ([place]) 05/2019   Spinal stenosis of lumbar region 06/14/2019   Spinal stenosis, lumbar region without neurogenic claudication 06/14/2019   Unsteadiness on feet 07/08/2019   Vitamin D deficiency 06/22/2019   Wedge compression fracture of unspecified lumbar vertebra, subsequent encounter for fracture with routine healing 07/08/2019   Past Surgical History:  Procedure Laterality Date   APPENDECTOMY     KYPHOPLASTY      Family History  Problem Relation Age of Onset   Breast cancer Sister    Lung cancer Sister    Lung cancer Daughter    Social  History   Socioeconomic History   Marital status: Widowed    Spouse name: Not on file   Number of children: 4   Years of education: Not on file   Highest education level: Not on file  Occupational History   Occupation: retired  Tobacco Use   Smoking status: Former    Types: Cigarettes    Quit date: 11/01/2018    Years since quitting: 3.0   Smokeless tobacco: Never  Vaping Use   Vaping Use: Never used  Substance and Sexual Activity   Alcohol use: Never   Drug use: Never   Sexual activity: Not on file  Other Topics Concern   Not on file  Social History Narrative   Not on file   Social Determinants of Health   Financial Resource Strain: Medium Risk   Difficulty of Paying Living Expenses: Somewhat hard  Food  Insecurity: Not on file  Transportation Needs: Unmet Transportation Needs   Lack of Transportation (Medical): Yes   Lack of Transportation (Non-Medical): Yes  Physical Activity: Not on file  Stress: Not on file  Social Connections: Not on file    Review of Systems CONSTITUTIONAL: Negative for chills, fatigue, fever, unintentional weight gain and unintentional weight loss.  E/N/T: Negative for ear pain, nasal congestion and sore throat.  CARDIOVASCULAR: Negative for chest pain, dizziness, palpitations and pedal edema.  RESPIRATORY: see HPI GASTROINTESTINAL: Negative for abdominal pain, acid reflux symptoms, constipation, diarrhea, nausea and vomiting.  INTEGUMENTARY: Negative for rash.     Objective:  BP (!) 158/90 (BP Location: Right Arm, Patient Position: Sitting, Cuff Size: Normal)   Pulse 96   Temp (!) 95.3 F (35.2 C)   Resp 18   Ht '5\' 3"'$  (1.6 m)   Wt 142 lb (64.4 kg)   SpO2 99%   BMI 25.15 kg/m  PHYSICAL EXAM:   VS: BP (!) 158/90 (BP Location: Right Arm, Patient Position: Sitting, Cuff Size: Normal)   Pulse 96   Temp (!) 95.3 F (35.2 C)   Resp 18   Ht '5\' 3"'$  (1.6 m)   Wt 142 lb (64.4 kg)   SpO2 99%   BMI 25.15 kg/m   GEN: Well nourished,  well developed, in no acute distress  HEENT: normal external ears and nose - normal external auditory canals and TMS -  - Lips, Teeth and Gums - normal  Oropharynx - normal mucosa, palate, and posterior pharynx  Cardiac: RRR; no murmurs, rubs, or gallops,no edema - no significant varicosities Respiratory:  normal respiratory rate and pattern with no distress - normal breath sounds with no rales, rhonchi, wheezes or rubs GI: normal bowel sounds, no masses or tenderness  Skin: warm and dry, no rash       11/18/2021    3:04 PM 10/01/2021    2:05 PM 09/15/2021    9:47 AM  BP/Weight  Systolic BP 825 053 976  Diastolic BP 90 88 82  Wt. (Lbs) 142 145 143  BMI 25.15 kg/m2 25.69 kg/m2 25.33 kg/m2     Lab Results  Component Value Date   WBC 10.4 09/15/2021   HGB 13.9 09/15/2021   HCT 41.4 09/15/2021   PLT 205 09/15/2021   GLUCOSE 103 (H) 09/15/2021   CHOL 156 09/15/2021   TRIG 91 09/15/2021   HDL 53 09/15/2021   LDLCALC 86 09/15/2021   ALT 16 09/15/2021   AST 25 09/15/2021   NA 140 09/15/2021   K 5.3 (H) 09/15/2021   CL 91 (L) 09/15/2021   CREATININE 0.56 (L) 09/15/2021   BUN 9 09/15/2021   CO2 35 (H) 09/15/2021   TSH 1.700 09/15/2021      Assessment & Plan:   Problem List Items Addressed This Visit   None Visit Diagnoses     COPD without exacerbation ([place])    -  Primary   Relevant Orders   Pro b natriuretic peptide (BNP) Continue current meds   Abnormal laboratory test       Relevant Orders   CBC with Differential/Platelet   Comprehensive metabolic panel   Pro b natriuretic peptide (BNP)   Primary hypertension     Continue current meds Will have home health monitor bp over next 1-2 weeks and adjust medication accordingly   Acute cystitis without hematuria     See scanned urine culture report Rx for macrobid '100mg'$  bid   Chronic respiratory failure with  hypoxia, on home oxygen therapy (HCC)     Continue current meds and oxygen therapy     .  Meds ordered  this encounter  Medications   fluconazole (DIFLUCAN) 150 MG tablet    Sig: Take 1 po and may repeat once in 5 days if needed    Dispense:  2 tablet    Refill:  0    Order Specific Question:   Supervising Provider    Answer:   Shelton Silvas   nitrofurantoin, macrocrystal-monohydrate, (MACROBID) 100 MG capsule    Sig: Take 1 capsule (100 mg total) by mouth 2 (two) times daily.    Dispense:  20 capsule    Refill:  0    Order Specific Question:   Supervising Provider    AnswerShelton Silvas    Orders Placed This Encounter  Procedures   CBC with Differential/Platelet   Comprehensive metabolic panel   Pro b natriuretic peptide (BNP)     Follow-up: No follow-ups on file.  An After Visit Summary was printed and given to the patient.  Yetta Flock [facility] [phone]

## 2021-11-19 DIAGNOSIS — J209 Acute bronchitis, unspecified: Secondary | ICD-10-CM | POA: Diagnosis not present

## 2021-11-19 DIAGNOSIS — Z9981 Dependence on supplemental oxygen: Secondary | ICD-10-CM | POA: Diagnosis not present

## 2021-11-19 DIAGNOSIS — I700 Atherosclerosis of aorta: Secondary | ICD-10-CM | POA: Diagnosis not present

## 2021-11-19 DIAGNOSIS — I10 Essential (primary) hypertension: Secondary | ICD-10-CM | POA: Diagnosis not present

## 2021-11-19 DIAGNOSIS — Z7952 Long term (current) use of systemic steroids: Secondary | ICD-10-CM | POA: Diagnosis not present

## 2021-11-19 DIAGNOSIS — Z86711 Personal history of pulmonary embolism: Secondary | ICD-10-CM | POA: Diagnosis not present

## 2021-11-19 DIAGNOSIS — M1990 Unspecified osteoarthritis, unspecified site: Secondary | ICD-10-CM | POA: Diagnosis not present

## 2021-11-19 DIAGNOSIS — J9611 Chronic respiratory failure with hypoxia: Secondary | ICD-10-CM | POA: Diagnosis not present

## 2021-11-19 DIAGNOSIS — D649 Anemia, unspecified: Secondary | ICD-10-CM | POA: Diagnosis not present

## 2021-11-19 DIAGNOSIS — Z87891 Personal history of nicotine dependence: Secondary | ICD-10-CM | POA: Diagnosis not present

## 2021-11-19 DIAGNOSIS — I4891 Unspecified atrial fibrillation: Secondary | ICD-10-CM | POA: Diagnosis not present

## 2021-11-19 DIAGNOSIS — Z87440 Personal history of urinary (tract) infections: Secondary | ICD-10-CM | POA: Diagnosis not present

## 2021-11-19 DIAGNOSIS — E871 Hypo-osmolality and hyponatremia: Secondary | ICD-10-CM | POA: Diagnosis not present

## 2021-11-19 DIAGNOSIS — Z79899 Other long term (current) drug therapy: Secondary | ICD-10-CM | POA: Diagnosis not present

## 2021-11-19 DIAGNOSIS — Z7951 Long term (current) use of inhaled steroids: Secondary | ICD-10-CM | POA: Diagnosis not present

## 2021-11-19 DIAGNOSIS — Z9049 Acquired absence of other specified parts of digestive tract: Secondary | ICD-10-CM | POA: Diagnosis not present

## 2021-11-19 DIAGNOSIS — J439 Emphysema, unspecified: Secondary | ICD-10-CM | POA: Diagnosis not present

## 2021-11-19 LAB — CBC WITH DIFFERENTIAL/PLATELET
Basophils Absolute: 0.1 10*3/uL (ref 0.0–0.2)
Basos: 1 %
EOS (ABSOLUTE): 0.5 10*3/uL — ABNORMAL HIGH (ref 0.0–0.4)
Eos: 6 %
Hematocrit: 39.7 % (ref 34.0–46.6)
Hemoglobin: 12.8 g/dL (ref 11.1–15.9)
Immature Grans (Abs): 0 10*3/uL (ref 0.0–0.1)
Immature Granulocytes: 1 %
Lymphocytes Absolute: 1 10*3/uL (ref 0.7–3.1)
Lymphs: 13 %
MCH: 30.5 pg (ref 26.6–33.0)
MCHC: 32.2 g/dL (ref 31.5–35.7)
MCV: 95 fL (ref 79–97)
Monocytes Absolute: 0.7 10*3/uL (ref 0.1–0.9)
Monocytes: 8 %
Neutrophils Absolute: 5.7 10*3/uL (ref 1.4–7.0)
Neutrophils: 71 %
Platelets: 215 10*3/uL (ref 150–450)
RBC: 4.2 x10E6/uL (ref 3.77–5.28)
RDW: 11.7 % (ref 11.7–15.4)
WBC: 7.9 10*3/uL (ref 3.4–10.8)

## 2021-11-19 LAB — COMPREHENSIVE METABOLIC PANEL
ALT: 13 IU/L (ref 0–32)
AST: 18 IU/L (ref 0–40)
Albumin/Globulin Ratio: 2 (ref 1.2–2.2)
Albumin: 4.3 g/dL (ref 3.7–4.7)
Alkaline Phosphatase: 68 IU/L (ref 44–121)
BUN/Creatinine Ratio: 19 (ref 12–28)
BUN: 9 mg/dL (ref 8–27)
Bilirubin Total: 0.3 mg/dL (ref 0.0–1.2)
CO2: 31 mmol/L — ABNORMAL HIGH (ref 20–29)
Calcium: 9.6 mg/dL (ref 8.7–10.3)
Chloride: 94 mmol/L — ABNORMAL LOW (ref 96–106)
Creatinine, Ser: 0.48 mg/dL — ABNORMAL LOW (ref 0.57–1.00)
Globulin, Total: 2.2 g/dL (ref 1.5–4.5)
Glucose: 94 mg/dL (ref 70–99)
Potassium: 4.4 mmol/L (ref 3.5–5.2)
Sodium: 140 mmol/L (ref 134–144)
Total Protein: 6.5 g/dL (ref 6.0–8.5)
eGFR: 96 mL/min/{1.73_m2} (ref >59)

## 2021-11-19 LAB — PRO B NATRIURETIC PEPTIDE: NT-Pro BNP: 2030 pg/mL — ABNORMAL HIGH (ref 0–738)

## 2021-11-20 ENCOUNTER — Telehealth: Payer: Self-pay

## 2021-11-20 ENCOUNTER — Other Ambulatory Visit: Payer: Self-pay

## 2021-11-20 DIAGNOSIS — J209 Acute bronchitis, unspecified: Secondary | ICD-10-CM | POA: Diagnosis not present

## 2021-11-20 DIAGNOSIS — J9611 Chronic respiratory failure with hypoxia: Secondary | ICD-10-CM | POA: Diagnosis not present

## 2021-11-20 DIAGNOSIS — D649 Anemia, unspecified: Secondary | ICD-10-CM | POA: Diagnosis not present

## 2021-11-20 DIAGNOSIS — R7989 Other specified abnormal findings of blood chemistry: Secondary | ICD-10-CM

## 2021-11-20 DIAGNOSIS — Z86711 Personal history of pulmonary embolism: Secondary | ICD-10-CM | POA: Diagnosis not present

## 2021-11-20 DIAGNOSIS — Z9981 Dependence on supplemental oxygen: Secondary | ICD-10-CM | POA: Diagnosis not present

## 2021-11-20 DIAGNOSIS — Z7952 Long term (current) use of systemic steroids: Secondary | ICD-10-CM | POA: Diagnosis not present

## 2021-11-20 DIAGNOSIS — I10 Essential (primary) hypertension: Secondary | ICD-10-CM | POA: Diagnosis not present

## 2021-11-20 DIAGNOSIS — I4891 Unspecified atrial fibrillation: Secondary | ICD-10-CM | POA: Diagnosis not present

## 2021-11-20 DIAGNOSIS — Z87440 Personal history of urinary (tract) infections: Secondary | ICD-10-CM | POA: Diagnosis not present

## 2021-11-20 DIAGNOSIS — I700 Atherosclerosis of aorta: Secondary | ICD-10-CM | POA: Diagnosis not present

## 2021-11-20 DIAGNOSIS — Z9049 Acquired absence of other specified parts of digestive tract: Secondary | ICD-10-CM | POA: Diagnosis not present

## 2021-11-20 DIAGNOSIS — M1990 Unspecified osteoarthritis, unspecified site: Secondary | ICD-10-CM | POA: Diagnosis not present

## 2021-11-20 DIAGNOSIS — J439 Emphysema, unspecified: Secondary | ICD-10-CM | POA: Diagnosis not present

## 2021-11-20 DIAGNOSIS — Z87891 Personal history of nicotine dependence: Secondary | ICD-10-CM | POA: Diagnosis not present

## 2021-11-20 DIAGNOSIS — E871 Hypo-osmolality and hyponatremia: Secondary | ICD-10-CM | POA: Diagnosis not present

## 2021-11-20 DIAGNOSIS — Z79899 Other long term (current) drug therapy: Secondary | ICD-10-CM | POA: Diagnosis not present

## 2021-11-20 DIAGNOSIS — Z7951 Long term (current) use of inhaled steroids: Secondary | ICD-10-CM | POA: Diagnosis not present

## 2021-11-20 NOTE — Telephone Encounter (Signed)
Tamecka called back and is willing to go to cardiology.  Referral sent.

## 2021-11-25 DIAGNOSIS — Z7952 Long term (current) use of systemic steroids: Secondary | ICD-10-CM | POA: Diagnosis not present

## 2021-11-25 DIAGNOSIS — Z9981 Dependence on supplemental oxygen: Secondary | ICD-10-CM | POA: Diagnosis not present

## 2021-11-25 DIAGNOSIS — Z87440 Personal history of urinary (tract) infections: Secondary | ICD-10-CM | POA: Diagnosis not present

## 2021-11-25 DIAGNOSIS — I700 Atherosclerosis of aorta: Secondary | ICD-10-CM | POA: Diagnosis not present

## 2021-11-25 DIAGNOSIS — Z86711 Personal history of pulmonary embolism: Secondary | ICD-10-CM | POA: Diagnosis not present

## 2021-11-25 DIAGNOSIS — D649 Anemia, unspecified: Secondary | ICD-10-CM | POA: Diagnosis not present

## 2021-11-25 DIAGNOSIS — J209 Acute bronchitis, unspecified: Secondary | ICD-10-CM | POA: Diagnosis not present

## 2021-11-25 DIAGNOSIS — Z87891 Personal history of nicotine dependence: Secondary | ICD-10-CM | POA: Diagnosis not present

## 2021-11-25 DIAGNOSIS — J9611 Chronic respiratory failure with hypoxia: Secondary | ICD-10-CM | POA: Diagnosis not present

## 2021-11-25 DIAGNOSIS — J439 Emphysema, unspecified: Secondary | ICD-10-CM | POA: Diagnosis not present

## 2021-11-25 DIAGNOSIS — Z9049 Acquired absence of other specified parts of digestive tract: Secondary | ICD-10-CM | POA: Diagnosis not present

## 2021-11-25 DIAGNOSIS — M1990 Unspecified osteoarthritis, unspecified site: Secondary | ICD-10-CM | POA: Diagnosis not present

## 2021-11-25 DIAGNOSIS — Z7951 Long term (current) use of inhaled steroids: Secondary | ICD-10-CM | POA: Diagnosis not present

## 2021-11-25 DIAGNOSIS — I10 Essential (primary) hypertension: Secondary | ICD-10-CM | POA: Diagnosis not present

## 2021-11-25 DIAGNOSIS — I4891 Unspecified atrial fibrillation: Secondary | ICD-10-CM | POA: Diagnosis not present

## 2021-11-25 DIAGNOSIS — Z79899 Other long term (current) drug therapy: Secondary | ICD-10-CM | POA: Diagnosis not present

## 2021-11-25 DIAGNOSIS — E871 Hypo-osmolality and hyponatremia: Secondary | ICD-10-CM | POA: Diagnosis not present

## 2021-11-26 DIAGNOSIS — Z9981 Dependence on supplemental oxygen: Secondary | ICD-10-CM | POA: Diagnosis not present

## 2021-11-26 DIAGNOSIS — Z7951 Long term (current) use of inhaled steroids: Secondary | ICD-10-CM | POA: Diagnosis not present

## 2021-11-26 DIAGNOSIS — M1990 Unspecified osteoarthritis, unspecified site: Secondary | ICD-10-CM | POA: Diagnosis not present

## 2021-11-26 DIAGNOSIS — J439 Emphysema, unspecified: Secondary | ICD-10-CM | POA: Diagnosis not present

## 2021-11-26 DIAGNOSIS — D649 Anemia, unspecified: Secondary | ICD-10-CM | POA: Diagnosis not present

## 2021-11-26 DIAGNOSIS — I700 Atherosclerosis of aorta: Secondary | ICD-10-CM | POA: Diagnosis not present

## 2021-11-26 DIAGNOSIS — Z79899 Other long term (current) drug therapy: Secondary | ICD-10-CM | POA: Diagnosis not present

## 2021-11-26 DIAGNOSIS — Z87440 Personal history of urinary (tract) infections: Secondary | ICD-10-CM | POA: Diagnosis not present

## 2021-11-26 DIAGNOSIS — Z7952 Long term (current) use of systemic steroids: Secondary | ICD-10-CM | POA: Diagnosis not present

## 2021-11-26 DIAGNOSIS — E871 Hypo-osmolality and hyponatremia: Secondary | ICD-10-CM | POA: Diagnosis not present

## 2021-11-26 DIAGNOSIS — Z86711 Personal history of pulmonary embolism: Secondary | ICD-10-CM | POA: Diagnosis not present

## 2021-11-26 DIAGNOSIS — Z9049 Acquired absence of other specified parts of digestive tract: Secondary | ICD-10-CM | POA: Diagnosis not present

## 2021-11-26 DIAGNOSIS — I10 Essential (primary) hypertension: Secondary | ICD-10-CM | POA: Diagnosis not present

## 2021-11-26 DIAGNOSIS — Z87891 Personal history of nicotine dependence: Secondary | ICD-10-CM | POA: Diagnosis not present

## 2021-11-26 DIAGNOSIS — I4891 Unspecified atrial fibrillation: Secondary | ICD-10-CM | POA: Diagnosis not present

## 2021-11-26 DIAGNOSIS — J209 Acute bronchitis, unspecified: Secondary | ICD-10-CM | POA: Diagnosis not present

## 2021-11-26 DIAGNOSIS — J9611 Chronic respiratory failure with hypoxia: Secondary | ICD-10-CM | POA: Diagnosis not present

## 2021-11-28 DIAGNOSIS — Z87891 Personal history of nicotine dependence: Secondary | ICD-10-CM | POA: Diagnosis not present

## 2021-11-28 DIAGNOSIS — E871 Hypo-osmolality and hyponatremia: Secondary | ICD-10-CM | POA: Diagnosis not present

## 2021-11-28 DIAGNOSIS — D649 Anemia, unspecified: Secondary | ICD-10-CM | POA: Diagnosis not present

## 2021-11-28 DIAGNOSIS — I700 Atherosclerosis of aorta: Secondary | ICD-10-CM | POA: Diagnosis not present

## 2021-11-28 DIAGNOSIS — J9611 Chronic respiratory failure with hypoxia: Secondary | ICD-10-CM | POA: Diagnosis not present

## 2021-11-28 DIAGNOSIS — Z79899 Other long term (current) drug therapy: Secondary | ICD-10-CM | POA: Diagnosis not present

## 2021-11-28 DIAGNOSIS — Z9981 Dependence on supplemental oxygen: Secondary | ICD-10-CM | POA: Diagnosis not present

## 2021-11-28 DIAGNOSIS — Z86711 Personal history of pulmonary embolism: Secondary | ICD-10-CM | POA: Diagnosis not present

## 2021-11-28 DIAGNOSIS — Z87440 Personal history of urinary (tract) infections: Secondary | ICD-10-CM | POA: Diagnosis not present

## 2021-11-28 DIAGNOSIS — J439 Emphysema, unspecified: Secondary | ICD-10-CM | POA: Diagnosis not present

## 2021-11-28 DIAGNOSIS — Z9049 Acquired absence of other specified parts of digestive tract: Secondary | ICD-10-CM | POA: Diagnosis not present

## 2021-11-28 DIAGNOSIS — J209 Acute bronchitis, unspecified: Secondary | ICD-10-CM | POA: Diagnosis not present

## 2021-11-28 DIAGNOSIS — I10 Essential (primary) hypertension: Secondary | ICD-10-CM | POA: Diagnosis not present

## 2021-11-28 DIAGNOSIS — M1990 Unspecified osteoarthritis, unspecified site: Secondary | ICD-10-CM | POA: Diagnosis not present

## 2021-11-28 DIAGNOSIS — I4891 Unspecified atrial fibrillation: Secondary | ICD-10-CM | POA: Diagnosis not present

## 2021-11-28 DIAGNOSIS — Z7952 Long term (current) use of systemic steroids: Secondary | ICD-10-CM | POA: Diagnosis not present

## 2021-11-28 DIAGNOSIS — Z7951 Long term (current) use of inhaled steroids: Secondary | ICD-10-CM | POA: Diagnosis not present

## 2021-12-02 DIAGNOSIS — D649 Anemia, unspecified: Secondary | ICD-10-CM | POA: Diagnosis not present

## 2021-12-02 DIAGNOSIS — I4891 Unspecified atrial fibrillation: Secondary | ICD-10-CM | POA: Diagnosis not present

## 2021-12-02 DIAGNOSIS — E871 Hypo-osmolality and hyponatremia: Secondary | ICD-10-CM | POA: Diagnosis not present

## 2021-12-02 DIAGNOSIS — Z87440 Personal history of urinary (tract) infections: Secondary | ICD-10-CM | POA: Diagnosis not present

## 2021-12-02 DIAGNOSIS — J209 Acute bronchitis, unspecified: Secondary | ICD-10-CM | POA: Diagnosis not present

## 2021-12-02 DIAGNOSIS — Z9981 Dependence on supplemental oxygen: Secondary | ICD-10-CM | POA: Diagnosis not present

## 2021-12-02 DIAGNOSIS — Z7952 Long term (current) use of systemic steroids: Secondary | ICD-10-CM | POA: Diagnosis not present

## 2021-12-02 DIAGNOSIS — Z86711 Personal history of pulmonary embolism: Secondary | ICD-10-CM | POA: Diagnosis not present

## 2021-12-02 DIAGNOSIS — J439 Emphysema, unspecified: Secondary | ICD-10-CM | POA: Diagnosis not present

## 2021-12-02 DIAGNOSIS — M1990 Unspecified osteoarthritis, unspecified site: Secondary | ICD-10-CM | POA: Diagnosis not present

## 2021-12-02 DIAGNOSIS — Z9049 Acquired absence of other specified parts of digestive tract: Secondary | ICD-10-CM | POA: Diagnosis not present

## 2021-12-02 DIAGNOSIS — Z7951 Long term (current) use of inhaled steroids: Secondary | ICD-10-CM | POA: Diagnosis not present

## 2021-12-02 DIAGNOSIS — I700 Atherosclerosis of aorta: Secondary | ICD-10-CM | POA: Diagnosis not present

## 2021-12-02 DIAGNOSIS — Z87891 Personal history of nicotine dependence: Secondary | ICD-10-CM | POA: Diagnosis not present

## 2021-12-02 DIAGNOSIS — Z79899 Other long term (current) drug therapy: Secondary | ICD-10-CM | POA: Diagnosis not present

## 2021-12-02 DIAGNOSIS — J9611 Chronic respiratory failure with hypoxia: Secondary | ICD-10-CM | POA: Diagnosis not present

## 2021-12-02 DIAGNOSIS — I10 Essential (primary) hypertension: Secondary | ICD-10-CM | POA: Diagnosis not present

## 2021-12-04 DIAGNOSIS — I10 Essential (primary) hypertension: Secondary | ICD-10-CM | POA: Diagnosis not present

## 2021-12-04 DIAGNOSIS — M1990 Unspecified osteoarthritis, unspecified site: Secondary | ICD-10-CM | POA: Diagnosis not present

## 2021-12-04 DIAGNOSIS — Z79899 Other long term (current) drug therapy: Secondary | ICD-10-CM | POA: Diagnosis not present

## 2021-12-04 DIAGNOSIS — J209 Acute bronchitis, unspecified: Secondary | ICD-10-CM | POA: Diagnosis not present

## 2021-12-04 DIAGNOSIS — J9611 Chronic respiratory failure with hypoxia: Secondary | ICD-10-CM | POA: Diagnosis not present

## 2021-12-04 DIAGNOSIS — I4891 Unspecified atrial fibrillation: Secondary | ICD-10-CM | POA: Diagnosis not present

## 2021-12-04 DIAGNOSIS — Z9049 Acquired absence of other specified parts of digestive tract: Secondary | ICD-10-CM | POA: Diagnosis not present

## 2021-12-04 DIAGNOSIS — J439 Emphysema, unspecified: Secondary | ICD-10-CM | POA: Diagnosis not present

## 2021-12-04 DIAGNOSIS — Z7952 Long term (current) use of systemic steroids: Secondary | ICD-10-CM | POA: Diagnosis not present

## 2021-12-04 DIAGNOSIS — E871 Hypo-osmolality and hyponatremia: Secondary | ICD-10-CM | POA: Diagnosis not present

## 2021-12-04 DIAGNOSIS — D649 Anemia, unspecified: Secondary | ICD-10-CM | POA: Diagnosis not present

## 2021-12-04 DIAGNOSIS — Z87891 Personal history of nicotine dependence: Secondary | ICD-10-CM | POA: Diagnosis not present

## 2021-12-04 DIAGNOSIS — I700 Atherosclerosis of aorta: Secondary | ICD-10-CM | POA: Diagnosis not present

## 2021-12-04 DIAGNOSIS — Z86711 Personal history of pulmonary embolism: Secondary | ICD-10-CM | POA: Diagnosis not present

## 2021-12-04 DIAGNOSIS — Z7951 Long term (current) use of inhaled steroids: Secondary | ICD-10-CM | POA: Diagnosis not present

## 2021-12-04 DIAGNOSIS — Z9981 Dependence on supplemental oxygen: Secondary | ICD-10-CM | POA: Diagnosis not present

## 2021-12-04 DIAGNOSIS — Z87440 Personal history of urinary (tract) infections: Secondary | ICD-10-CM | POA: Diagnosis not present

## 2021-12-09 DIAGNOSIS — Z7951 Long term (current) use of inhaled steroids: Secondary | ICD-10-CM | POA: Diagnosis not present

## 2021-12-09 DIAGNOSIS — J209 Acute bronchitis, unspecified: Secondary | ICD-10-CM | POA: Diagnosis not present

## 2021-12-09 DIAGNOSIS — Z87891 Personal history of nicotine dependence: Secondary | ICD-10-CM | POA: Diagnosis not present

## 2021-12-09 DIAGNOSIS — Z86711 Personal history of pulmonary embolism: Secondary | ICD-10-CM | POA: Diagnosis not present

## 2021-12-09 DIAGNOSIS — Z9049 Acquired absence of other specified parts of digestive tract: Secondary | ICD-10-CM | POA: Diagnosis not present

## 2021-12-09 DIAGNOSIS — Z79899 Other long term (current) drug therapy: Secondary | ICD-10-CM | POA: Diagnosis not present

## 2021-12-09 DIAGNOSIS — E871 Hypo-osmolality and hyponatremia: Secondary | ICD-10-CM | POA: Diagnosis not present

## 2021-12-09 DIAGNOSIS — J9611 Chronic respiratory failure with hypoxia: Secondary | ICD-10-CM | POA: Diagnosis not present

## 2021-12-09 DIAGNOSIS — D649 Anemia, unspecified: Secondary | ICD-10-CM | POA: Diagnosis not present

## 2021-12-09 DIAGNOSIS — J449 Chronic obstructive pulmonary disease, unspecified: Secondary | ICD-10-CM | POA: Diagnosis not present

## 2021-12-09 DIAGNOSIS — I4891 Unspecified atrial fibrillation: Secondary | ICD-10-CM | POA: Diagnosis not present

## 2021-12-09 DIAGNOSIS — J439 Emphysema, unspecified: Secondary | ICD-10-CM | POA: Diagnosis not present

## 2021-12-09 DIAGNOSIS — M1990 Unspecified osteoarthritis, unspecified site: Secondary | ICD-10-CM | POA: Diagnosis not present

## 2021-12-09 DIAGNOSIS — Z9981 Dependence on supplemental oxygen: Secondary | ICD-10-CM | POA: Diagnosis not present

## 2021-12-09 DIAGNOSIS — Z7952 Long term (current) use of systemic steroids: Secondary | ICD-10-CM | POA: Diagnosis not present

## 2021-12-09 DIAGNOSIS — I10 Essential (primary) hypertension: Secondary | ICD-10-CM | POA: Diagnosis not present

## 2021-12-09 DIAGNOSIS — Z87440 Personal history of urinary (tract) infections: Secondary | ICD-10-CM | POA: Diagnosis not present

## 2021-12-09 DIAGNOSIS — I700 Atherosclerosis of aorta: Secondary | ICD-10-CM | POA: Diagnosis not present

## 2021-12-10 DIAGNOSIS — Z7952 Long term (current) use of systemic steroids: Secondary | ICD-10-CM | POA: Diagnosis not present

## 2021-12-10 DIAGNOSIS — I10 Essential (primary) hypertension: Secondary | ICD-10-CM | POA: Diagnosis not present

## 2021-12-10 DIAGNOSIS — J209 Acute bronchitis, unspecified: Secondary | ICD-10-CM | POA: Diagnosis not present

## 2021-12-10 DIAGNOSIS — J9611 Chronic respiratory failure with hypoxia: Secondary | ICD-10-CM | POA: Diagnosis not present

## 2021-12-10 DIAGNOSIS — Z9049 Acquired absence of other specified parts of digestive tract: Secondary | ICD-10-CM | POA: Diagnosis not present

## 2021-12-10 DIAGNOSIS — Z7951 Long term (current) use of inhaled steroids: Secondary | ICD-10-CM | POA: Diagnosis not present

## 2021-12-10 DIAGNOSIS — Z86711 Personal history of pulmonary embolism: Secondary | ICD-10-CM | POA: Diagnosis not present

## 2021-12-10 DIAGNOSIS — Z9981 Dependence on supplemental oxygen: Secondary | ICD-10-CM | POA: Diagnosis not present

## 2021-12-10 DIAGNOSIS — I4891 Unspecified atrial fibrillation: Secondary | ICD-10-CM | POA: Diagnosis not present

## 2021-12-10 DIAGNOSIS — Z87891 Personal history of nicotine dependence: Secondary | ICD-10-CM | POA: Diagnosis not present

## 2021-12-10 DIAGNOSIS — I700 Atherosclerosis of aorta: Secondary | ICD-10-CM | POA: Diagnosis not present

## 2021-12-10 DIAGNOSIS — M1990 Unspecified osteoarthritis, unspecified site: Secondary | ICD-10-CM | POA: Diagnosis not present

## 2021-12-10 DIAGNOSIS — E871 Hypo-osmolality and hyponatremia: Secondary | ICD-10-CM | POA: Diagnosis not present

## 2021-12-10 DIAGNOSIS — D649 Anemia, unspecified: Secondary | ICD-10-CM | POA: Diagnosis not present

## 2021-12-10 DIAGNOSIS — Z79899 Other long term (current) drug therapy: Secondary | ICD-10-CM | POA: Diagnosis not present

## 2021-12-10 DIAGNOSIS — Z87440 Personal history of urinary (tract) infections: Secondary | ICD-10-CM | POA: Diagnosis not present

## 2021-12-10 DIAGNOSIS — J439 Emphysema, unspecified: Secondary | ICD-10-CM | POA: Diagnosis not present

## 2021-12-12 DIAGNOSIS — J449 Chronic obstructive pulmonary disease, unspecified: Secondary | ICD-10-CM | POA: Diagnosis not present

## 2021-12-15 ENCOUNTER — Other Ambulatory Visit: Payer: Self-pay

## 2021-12-15 MED ORDER — IPRATROPIUM-ALBUTEROL 0.5-2.5 (3) MG/3ML IN SOLN: RESPIRATORY_TRACT | 0 refills | Status: DC

## 2021-12-17 ENCOUNTER — Ambulatory Visit: Payer: Medicare Other | Admitting: Cardiology

## 2021-12-29 ENCOUNTER — Telehealth: Payer: Medicare Other

## 2022-01-08 DIAGNOSIS — J449 Chronic obstructive pulmonary disease, unspecified: Secondary | ICD-10-CM | POA: Diagnosis not present

## 2022-01-12 DIAGNOSIS — J449 Chronic obstructive pulmonary disease, unspecified: Secondary | ICD-10-CM | POA: Diagnosis not present

## 2022-01-20 ENCOUNTER — Ambulatory Visit (INDEPENDENT_AMBULATORY_CARE_PROVIDER_SITE_OTHER): Payer: Medicare Other

## 2022-01-20 DIAGNOSIS — I480 Paroxysmal atrial fibrillation: Secondary | ICD-10-CM

## 2022-01-20 DIAGNOSIS — I10 Essential (primary) hypertension: Secondary | ICD-10-CM

## 2022-01-20 DIAGNOSIS — J449 Chronic obstructive pulmonary disease, unspecified: Secondary | ICD-10-CM

## 2022-01-20 NOTE — Patient Instructions (Signed)
Visit Information   Goals Addressed   None    Patient Care Plan: CCM Pharmacy Care Plan     Problem Identified: cad, hld, copd   Priority: High  Onset Date: 08/19/2020     Long-Range Goal: Disease Managment   Start Date: 08/19/2020  Expected End Date: 08/19/2021  Recent Progress: On track  Priority: High  Note:    Current Barriers:  Needs PAP renewed yearly  Pharmacist Clinical Goal(s):  Patient will contact provider office for questions/concerns as evidenced notation of same in electronic health record through collaboration with PharmD and provider.   Interventions: 1:1 collaboration with Marge Duncans, PA-C regarding development and update of comprehensive plan of care as evidenced by provider attestation and co-signature Inter-disciplinary care team collaboration (see longitudinal plan of care) Comprehensive medication review performed; medication list updated in electronic medical record  Cardio (BP goal <140/90) BP Readings from Last 3 Encounters:  07/01/21 130/70  06/02/21 128/80  03/17/21 (!) 142/84  -Uncontrolled -Current treatment: Carvedilol 6.'25mg'$  Appropriate, Effective, Safe, Accessible Digoxin 0.'125mg'$  Appropriate, Effective, Safe, Accessible -Medications previously tried: N/A  -Current home readings: Doesn't take -Current dietary habits: (Patient didn't specify) -Current exercise habits: N/A due to mobility -Denies hypotensive/hypertensive symptoms -Educated on BP goals and benefits of medications for prevention of heart attack, stroke and kidney damage; -Counseled to monitor BP at home if possible, document, and provide log at future appointments -Counseled on diet and exercise extensively Sept 2022: Has appt with PCP next week, will defer to them since will be able to get BP reading Jan 2023: BP at goal  Hyperlipidemia: (LDL goal < 100) -Controlled The 10-year ASCVD risk score (Arnett DK, et al., 2019) is: 28.6%   Values used to calculate the score:      Age: 81 years     Sex: Female     Is Non-Hispanic African American: No     Diabetic: No     Tobacco smoker: No     Systolic Blood Pressure: 213 mmHg     Is BP treated: Yes     HDL Cholesterol: 45 mg/dL     Total Cholesterol: 141 mg/dL Lab Results  Component Value Date   CHOL 141 03/17/2021   CHOL 151 11/28/2020   CHOL 202 (H) 07/10/2020   Lab Results  Component Value Date   HDL 45 03/17/2021   HDL 52 11/28/2020   HDL 57 07/10/2020   Lab Results  Component Value Date   LDLCALC 77 03/17/2021   LDLCALC 81 11/28/2020   LDLCALC 127 (H) 07/10/2020   Lab Results  Component Value Date   TRIG 105 03/17/2021   TRIG 100 11/28/2020   TRIG 98 07/10/2020   Lab Results  Component Value Date   CHOLHDL 3.1 03/17/2021   CHOLHDL 2.9 11/28/2020   CHOLHDL 3.5 07/10/2020  No results found for: LDLDIRECT -Current treatment: Nexlizet Fatima Sanger Feb 2023) Appropriate, Effective, Safe, Query accessible -Medications previously tried: Statin  -Current dietary patterns: Didn't specify -Current exercise habits: N/A due to mobility -Educated on Cholesterol goals;  -Counseled on diet and exercise extensively Jan 2023: Renew PAP for Feb 2023 August 2023: Renew grant in Feb 2024  COPD (Goal: control symptoms and prevent exacerbations) -Not ideally controlled -Current treatment  Breo Appropriate, Query effective, Safe, Query accessible Albuterol TID prn Appropriate, Query effective, Safe, Accessible O2 3L -Medications previously tried: N/A  -Patient reports consistent use of maintenance inhaler -Frequency of rescue inhaler use:   -Jan 2023: 3-4x/day -Counseled on Proper inhaler technique;  Jan 2023: Patient using Albuterol 3-4x/day but is necessary due to constant O2 use. Per note from PCP, patient at goal and controlled August 2023: Patient can't afford breo. Was going to conduct CAT score but patient's main priority was Breo cost. Will have concierge do CAT next month  Patient  Goals/Self-Care Activities Patient will:  - take medications as prescribed  Follow Up Plan: Face to Face appointment with care management team member scheduled for: October 2023  Arizona Constable, Pharm.D. - [phone]      Ms. Slimp was given information about Chronic Care Management services today including:  CCM service includes personalized support from designated clinical staff supervised by her physician, including individualized plan of care and coordination with other care providers 24/7 contact phone numbers for assistance for urgent and routine care needs. Standard insurance, coinsurance, copays and deductibles apply for chronic care management only during months in which we provide at least 20 minutes of these services. Most insurances cover these services at 100%, however patients may be responsible for any copay, coinsurance and/or deductible if applicable. This service may help you avoid the need for more expensive face-to-face services. Only one practitioner may furnish and bill the service in a calendar month. The patient may stop CCM services at any time (effective at the end of the month) by phone call to the office staff.  Patient agreed to services and verbal consent obtained.   The patient verbalized understanding of instructions, educational materials, and care plan provided today and DECLINED offer to receive copy of patient instructions, educational materials, and care plan.  The pharmacy team will reach out to the patient again over the next 60 days.   Lane Hacker, [place]

## 2022-01-20 NOTE — Progress Notes (Signed)
Chronic Care Management Pharmacy Note  01/20/2022 Name:  Tina Jimenez MRN:  [id number] DOB:  Apr 25, 1941   Update:  Nexlitol assistance expires Feb 2024, will complete paperwork to renew Patient can't afford Breo, will do PAP ASAP Patient interested in [facility] but wants to wait for Breo PAP until she decides. Also called and spoke with son to explain process  Subjective: Tina Jimenez is an 81 y.o. year old female who is a primary patient of Marge Duncans, [place].  The CCM team was consulted for assistance with disease management and care coordination needs.    Engaged with patient by telephone for follow up visit in response to provider referral for pharmacy case management and/or care coordination services.   Consent to Services:  The patient was given information about Chronic Care Management services, agreed to services, and gave verbal consent prior to initiation of services.  Please see initial visit note for detailed documentation.   Patient Care Team: Marge Duncans, PA-C as PCP - General (Physician Assistant) Lane Hacker, [facility] as Pharmacist (Pharmacist)  Recent office visits:  None   Recent consult visits:  None   Hospital visits:  None in previous 6 months  Objective:  Lab Results  Component Value Date   CREATININE 0.48 (L) 11/18/2021   BUN 9 11/18/2021   GFRNONAA 94 07/10/2020   GFRAA 108 07/10/2020   NA 140 11/18/2021   K 4.4 11/18/2021   CALCIUM 9.6 11/18/2021   CO2 31 (H) 11/18/2021    No results found for: "HGBA1C", "FRUCTOSAMINE", "GFR", "MICROALBUR"  Last diabetic Eye exam: No results found for: "HMDIABEYEEXA"  Last diabetic Foot exam: No results found for: "HMDIABFOOTEX"   Lab Results  Component Value Date   CHOL 156 09/15/2021   HDL 53 09/15/2021   LDLCALC 86 09/15/2021   TRIG 91 09/15/2021   CHOLHDL 2.9 09/15/2021       Latest Ref Rng & Units 11/18/2021    4:05 PM 09/15/2021   10:48 AM 03/17/2021   10:25 AM  Hepatic Function   Total Protein 6.0 - 8.5 g/dL 6.5  6.6  6.7   Albumin 3.7 - 4.7 g/dL 4.3  4.3  4.5   AST 0 - 40 IU/L _0 ALT 0 - 32 IU/L _1 Alk Phosphatase 44 - 121 IU/L 68  74  97   Total Bilirubin 0.0 - 1.2 mg/dL 0.3  0.3  0.4     Lab Results  Component Value Date/Time   TSH 1.700 09/15/2021 10:48 AM   TSH 2.360 03/17/2021 10:25 AM       Latest Ref Rng & Units 11/18/2021    4:05 PM 09/15/2021   10:48 AM 03/17/2021   10:25 AM  CBC  WBC 3.4 - 10.8 x10E3/uL 7.9  10.4  11.1   Hemoglobin 11.1 - 15.9 g/dL 12.8  13.9  14.4   Hematocrit 34.0 - 46.6 % 39.7  41.4  44.5   Platelets 150 - 450 x10E3/uL 215  205  222     Lab Results  Component Value Date/Time   VD25OH 21.1 (L) 09/15/2021 10:48 AM   VD25OH 15.6 (L) 03/17/2021 10:25 AM    Clinical ASCVD: Yes  The 10-year ASCVD risk score (Arnett DK, et al., 2019) is: 43.7%   Values used to calculate the score:     Age: 89 years     Sex: Female     Is Non-Hispanic African American:  No     Diabetic: No     Tobacco smoker: No     Systolic Blood Pressure: 427 mmHg     Is BP treated: Yes     HDL Cholesterol: 53 mg/dL     Total Cholesterol: 156 mg/dL       09/15/2021    9:52 AM 05/21/2020    2:13 PM 04/08/2020   10:31 AM  Depression screen PHQ 2/9  Decreased Interest 0 0 0  Down, Depressed, Hopeless 0 0 0  PHQ - 2 Score 0 0 0     Other: (CHADS2VASc if Afib, MMRC or CAT for COPD, ACT, DEXA)  Social History   Tobacco Use  Smoking Status Former   Types: Cigarettes   Quit date: 11/01/2018   Years since quitting: 3.2  Smokeless Tobacco Never   BP Readings from Last 3 Encounters:  11/18/21 (!) 158/90  10/01/21 (!) 148/88  09/15/21 130/82   Pulse Readings from Last 3 Encounters:  11/18/21 96  10/01/21 84  06/02/21 65   Wt Readings from Last 3 Encounters:  11/18/21 142 lb (64.4 kg)  10/01/21 145 lb (65.8 kg)  09/15/21 143 lb (64.9 kg)    Assessment/Interventions: Review of patient past medical history, allergies,  medications, health status, including review of consultants reports, laboratory and other test data, was performed as part of comprehensive evaluation and provision of chronic care management services.   SDOH:  (Social Determinants of Health) assessments and interventions performed: Yes SDOH Interventions    Flowsheet Row Most Recent Value  SDOH Interventions   Financial Strain Interventions Other (Comment)  Transportation Interventions Intervention Not Indicated       Care Plan : [place]  Updates made by Lane Hacker, RPH since 01/20/2022 12:00 AM     Problem: cad, hld, copd   Priority: High  Onset Date: 08/19/2020     Long-Range Goal: Disease Managment   Start Date: 08/19/2020  Expected End Date: 08/19/2021  Recent Progress: On track  Priority: High  Note:    Current Barriers:  Needs PAP renewed yearly  Pharmacist Clinical Goal(s):  Patient will contact provider office for questions/concerns as evidenced notation of same in electronic health record through collaboration with PharmD and provider.   Interventions: 1:1 collaboration with Marge Duncans, PA-C regarding development and update of comprehensive plan of care as evidenced by provider attestation and co-signature Inter-disciplinary care team collaboration (see longitudinal plan of care) Comprehensive medication review performed; medication list updated in electronic medical record  Cardio (BP goal <140/90) BP Readings from Last 3 Encounters:  07/01/21 130/70  06/02/21 128/80  03/17/21 (!) 142/84  -Uncontrolled -Current treatment: Carvedilol 6.16m Appropriate, Effective, Safe, Accessible Digoxin 0.1273mAppropriate, Effective, Safe, Accessible -Medications previously tried: N/A  -Current home readings: Doesn't take -Current dietary habits: (Patient didn't specify) -Current exercise habits: N/A due to mobility -Denies hypotensive/hypertensive symptoms -Educated on BP goals and benefits of  medications for prevention of heart attack, stroke and kidney damage; -Counseled to monitor BP at home if possible, document, and provide log at future appointments -Counseled on diet and exercise extensively Sept 2022: Has appt with PCP next week, will defer to them since will be able to get BP reading Jan 2023: BP at goal  Hyperlipidemia: (LDL goal < 100) -Controlled The 10-year ASCVD risk score (Arnett DK, et al., 2019) is: 28.6%   Values used to calculate the score:     Age: [age]     Sex: Female  Is Non-Hispanic African American: No     Diabetic: No     Tobacco smoker: No     Systolic Blood Pressure: 032 mmHg     Is BP treated: Yes     HDL Cholesterol: 45 mg/dL     Total Cholesterol: 141 mg/dL Lab Results  Component Value Date   CHOL 141 03/17/2021   CHOL 151 11/28/2020   CHOL 202 (H) 07/10/2020   Lab Results  Component Value Date   HDL 45 03/17/2021   HDL 52 11/28/2020   HDL 57 07/10/2020   Lab Results  Component Value Date   LDLCALC 77 03/17/2021   LDLCALC 81 11/28/2020   LDLCALC 127 (H) 07/10/2020   Lab Results  Component Value Date   TRIG 105 03/17/2021   TRIG 100 11/28/2020   TRIG 98 07/10/2020   Lab Results  Component Value Date   CHOLHDL 3.1 03/17/2021   CHOLHDL 2.9 11/28/2020   CHOLHDL 3.5 07/10/2020  No results found for: LDLDIRECT -Current treatment: Nexlizet (Grant Feb 2023) Appropriate, Effective, Safe, Query accessible -Medications previously tried: Statin  -Current dietary patterns: Didn't specify -Current exercise habits: N/A due to mobility -Educated on Cholesterol goals;  -Counseled on diet and exercise extensively Jan 2023: Renew PAP for Feb 2023 August 2023: Renew grant in Feb 2024  COPD (Goal: control symptoms and prevent exacerbations) -Not ideally controlled -Current treatment  Breo Appropriate, Query effective, Safe, Query accessible Albuterol TID prn Appropriate, Query effective, Safe, Accessible O2 3L -Medications  previously tried: N/A  -Patient reports consistent use of maintenance inhaler -Frequency of rescue inhaler use:   -Jan 2023: 3-4x/day -Counseled on Proper inhaler technique; Jan 2023: Patient using Albuterol 3-4x/day but is necessary due to constant O2 use. Per note from PCP, patient at goal and controlled August 2023: Patient can't afford breo. Was going to conduct CAT score but patient's main priority was Breo cost. Will have concierge do CAT next month  Patient Goals/Self-Care Activities Patient will:  - take medications as prescribed  Follow Up Plan: Face to Face appointment with care management team member scheduled for: October 2023  [place] Constable, Pharm.D. - [phone]       Follow Up Plan: Face to Face appointment with care management team member scheduled for: October 2023  [place] Constable, [place].D. - [phone]

## 2022-01-22 ENCOUNTER — Telehealth: Payer: Self-pay

## 2022-01-22 NOTE — Progress Notes (Signed)
    Chronic Care Management Pharmacy Assistant   Name: Tina Jimenez  MRN: [id number] DOB: 1941-06-09   Reason for Encounter: Patient Assistance Documentation   PAP for Tina Jimenez has been started through [facility] and will be mailed out to pt to finish completing. Pt was made aware of the instructions and will save [name] number to follow up on application status     Medications: Outpatient Encounter Medications as of 01/22/2022  Medication Sig   Ascorbic Acid (VITAMIN C) 500 MG CAPS Take 1 tablet by mouth daily.   aspirin EC 81 MG tablet Take 81 mg by mouth every other day. Swallow whole.   aspirin-acetaminophen-caffeine (EXCEDRIN MIGRAINE) 250-250-65 MG tablet Take 1-2 tablets by mouth every 6 (six) hours as needed for headache.   Bempedoic Acid-Ezetimibe (NEXLIZET) 180-10 MG TABS Take 1 tablet by mouth daily in the afternoon.   carvedilol (COREG) 6.25 MG tablet TAKE 1 TABLET BY MOUTH TWICE A DAY   Co-Enzyme Q-10 100 MG CAPS Take 100 mg by mouth daily.   digoxin (LANOXIN) 0.125 MG tablet Take 1 tablet (125 mcg total) by mouth daily.   famotidine (PEPCID) 20 MG tablet Take 20 mg by mouth daily.   ferrous sulfate 325 (65 FE) MG tablet TAKE 1 TABLET BY MOUTH EVERY DAY WITH BREAKFAST   fluconazole (DIFLUCAN) 150 MG tablet Take 1 po and may repeat once in 5 days if needed   fluticasone (FLONASE) 50 MCG/ACT nasal spray SPRAY 2 SPRAYS INTO EACH NOSTRIL EVERY DAY   fluticasone furoate-vilanterol (BREO ELLIPTA) 200-25 MCG/ACT AEPB Inhale 1 puff into the lungs daily.   ipratropium-albuterol (DUONEB) 0.5-2.5 (3) MG/3ML SOLN USE 1 NEBULE IN NEBULIZER EVERY MORNING, NOON, EVENING, AND BEDTIME.   loratadine (CLARITIN) 10 MG tablet TAKE 1 TABLET BY MOUTH ONCE DAILY FOR ALLERGIES   nitrofurantoin, macrocrystal-monohydrate, (MACROBID) 100 MG capsule Take 1 capsule (100 mg total) by mouth 2 (two) times daily.   omeprazole (PRILOSEC) 20 MG capsule TAKE 1 CAPSULE BY MOUTH EVERY DAY   ondansetron (ZOFRAN)  4 MG tablet Take 4 mg by mouth every 8 (eight) hours as needed for nausea/vomiting.   polyethylene glycol (MIRALAX / GLYCOLAX) 17 g packet Take 17 g by mouth daily.   triamcinolone cream (KENALOG) 0.1 % APPLY TO AFFECTED AREA TWICE A DAY   VITAMIN D PO Take 1 tablet by mouth daily.   Facility-Administered Encounter Medications as of 01/22/2022  Medication   triamcinolone acetonide (KENALOG-40) injection 40 mg    [name], [place] Pharmacist Assistant  [phone]

## 2022-01-23 ENCOUNTER — Ambulatory Visit (INDEPENDENT_AMBULATORY_CARE_PROVIDER_SITE_OTHER): Payer: Medicare Other | Admitting: Physician Assistant

## 2022-01-23 ENCOUNTER — Encounter: Payer: Self-pay | Admitting: Physician Assistant

## 2022-01-23 VITALS — BP 142/90 | HR 87 | Temp 97.1°F | Ht 63.0 in | Wt 136.0 lb

## 2022-01-23 DIAGNOSIS — J438 Other emphysema: Secondary | ICD-10-CM

## 2022-01-23 MED ORDER — LEVALBUTEROL HCL 0.31 MG/3ML IN NEBU: 1.0000 | INHALATION_SOLUTION | Freq: Four times a day (QID) | RESPIRATORY_TRACT | 12 refills | Status: DC | PRN

## 2022-01-23 MED ORDER — TRELEGY ELLIPTA 200-62.5-25 MCG/ACT IN AEPB: 1.0000 | INHALATION_SPRAY | Freq: Every day | RESPIRATORY_TRACT | 2 refills | Status: DC

## 2022-01-23 NOTE — Progress Notes (Signed)
 Acute Office Visit  Subjective:    Patient ID: Tina Jimenez, female    DOB: 01/05/1941, 81 y.o.   MRN: [id number]  Chief Complaint  Patient presents with   Shortness of Breath    HPI: Patient is in today for complaints of her current medications.  She states she feels shaky after using her duoneb treatments - used to not bother her but in the past month she feels anxious and nervous.  She also states she feels as though Breo is not working as well for her as it had in the past.  She says she feels more out of breath when getting up and moving around in her house.  She is on continuous oxygen at home. She denies fever or productive cough.  No current wheezing or trouble breathing. She does have a history of COPD and emphysema and has been recommended to follow with pulmonologist which she refuses. Her last CT of chest was in 5/22 and was stable - defers further imaging at this time She denies chest pain/palpitations/edema  Past Medical History:  Diagnosis Date   Abdominal pain 10/13/2019   Abnormal blood chemistry 08/22/2019   Acute blood loss anemia 06/22/2019   Acute bronchitis with COPD (HCC) 08/15/2019   Acute cystitis with hematuria 10/26/2019   Acute embolism and thrombosis of deep veins of left upper extremity (HCC) 07/08/2019   Acute hypoxemic respiratory failure (HCC) 07/08/2019   Acute pain of left shoulder 10/26/2019   Acute posthemorrhagic anemia 07/08/2019   Acute pulmonary embolism without acute cor pulmonale (HCC) 06/21/2019   Adnexal mass    Anemia 01/08/2020   Arm DVT (deep venous thromboembolism), acute, left (HCC) 06/26/2019   Atrial fibrillation (HCC)    Bladder mass 10/26/2019   Compression fracture of body of thoracic vertebra (HCC) 2020   COPD (chronic obstructive pulmonary disease) (HCC)    Coronary artery calcification seen on CT scan 05/23/2020   COVID-19 07/08/2019   Dependence on supplemental oxygen 04/14/2020   Effusion, left ankle 08/03/2019   Effusion, left  foot 08/03/2019   Effusion, right ankle 08/03/2019   Effusion, right foot 08/03/2019   Elevated white blood cell count, unspecified 07/11/2019   Encounter for other orthopedic aftercare 07/08/2019   Essential (primary) hypertension 07/08/2019   Gastro-esophageal reflux disease without esophagitis 07/08/2019   GERD (gastroesophageal reflux disease)    History of pulmonary embolism 10/02/2019   Hyperlipemia    Hypertension 08/15/2019   Localized edema 08/22/2019   Mixed dyslipidemia 05/23/2020   Muscle weakness (generalized) 07/08/2019   Osteoporosis    Other disorders of electrolyte and fluid balance, not elsewhere classified 08/10/2019   Other fatigue 01/08/2020   Oxygen deficiency    Paroxysmal atrial fibrillation (HCC) 06/20/2019   Persistent atrial fibrillation (HCC) 08/22/2019   Pneumonia 05/2019   COVID Pneumonia   Pneumonia due to COVID-19 virus 07/08/2019   Precordial pain 06/20/2019   Pulmonary embolism (HCC) 05/2019   Spinal stenosis of lumbar region 06/14/2019   Spinal stenosis, lumbar region without neurogenic claudication 06/14/2019   Unsteadiness on feet 07/08/2019   Vitamin D deficiency 06/22/2019   Wedge compression fracture of unspecified lumbar vertebra, subsequent encounter for fracture with routine healing 07/08/2019    Past Surgical History:  Procedure Laterality Date   APPENDECTOMY     KYPHOPLASTY      Family History  Problem Relation Age of Onset   Breast cancer Sister    Lung cancer Sister    Lung cancer Daughter       Social History   Socioeconomic History   Marital status: Widowed    Spouse name: Not on file   Number of children: 4   Years of education: Not on file   Highest education level: Not on file  Occupational History   Occupation: retired  Tobacco Use   Smoking status: Former    Types: Cigarettes    Quit date: 11/01/2018    Years since quitting: 3.2   Smokeless tobacco: Never  Vaping Use   Vaping Use: Never used  Substance and Sexual Activity    Alcohol use: Never   Drug use: Never   Sexual activity: Not on file  Other Topics Concern   Not on file  Social History Narrative   Not on file   Social Determinants of Health   Financial Resource Strain: High Risk (01/20/2022)   Overall Financial Resource Strain (CARDIA)    Difficulty of Paying Living Expenses: Hard  Food Insecurity: No Food Insecurity (08/19/2020)   Hunger Vital Sign    Worried About Running Out of Food in the Last Year: Never true    P[place] in the Last Year: Never true  Transportation Needs: No Transportation Needs (01/20/2022)   PRAPARE - Hydrologist (Medical): No    Lack of Transportation (Non-Medical): No  Physical Activity: Not on file  Stress: Not on file  Social Connections: Not on file  Intimate Partner Violence: Not on file    Outpatient Medications Prior to Visit  Medication Sig Dispense Refill   Ascorbic Acid (VITAMIN C) 500 MG CAPS Take 1 tablet by mouth daily.     aspirin EC 81 MG tablet Take 81 mg by mouth every other day. Swallow whole.     aspirin-acetaminophen-caffeine (EXCEDRIN MIGRAINE) 250-250-65 MG tablet Take 1-2 tablets by mouth every 6 (six) hours as needed for headache.     Bempedoic Acid-Ezetimibe (NEXLIZET) 180-10 MG TABS Take 1 tablet by mouth daily in the afternoon. 90 tablet 1   carvedilol (COREG) 6.25 MG tablet TAKE 1 TABLET BY MOUTH TWICE A DAY 180 tablet 0   Co-Enzyme Q-10 100 MG CAPS Take 100 mg by mouth daily.     digoxin (LANOXIN) 0.125 MG tablet Take 1 tablet (125 mcg total) by mouth daily. 90 tablet 3   famotidine (PEPCID) 20 MG tablet Take 20 mg by mouth daily.     ferrous sulfate 325 (65 FE) MG tablet TAKE 1 TABLET BY MOUTH EVERY DAY WITH BREAKFAST 90 tablet 1   fluticasone (FLONASE) 50 MCG/ACT nasal spray SPRAY 2 SPRAYS INTO EACH NOSTRIL EVERY DAY 48 mL 2   loratadine (CLARITIN) 10 MG tablet TAKE 1 TABLET BY MOUTH ONCE DAILY FOR ALLERGIES 90 tablet 1   omeprazole (PRILOSEC) 20 MG  capsule TAKE 1 CAPSULE BY MOUTH EVERY DAY 90 capsule 1   ondansetron (ZOFRAN) 4 MG tablet Take 4 mg by mouth every 8 (eight) hours as needed for nausea/vomiting.     polyethylene glycol (MIRALAX / GLYCOLAX) 17 g packet Take 17 g by mouth daily.     triamcinolone cream (KENALOG) 0.1 % APPLY TO AFFECTED AREA TWICE A DAY 30 g 0   VITAMIN D PO Take 1 tablet by mouth daily.     fluconazole (DIFLUCAN) 150 MG tablet Take 1 po and may repeat once in 5 days if needed 2 tablet 0   fluticasone furoate-vilanterol (BREO ELLIPTA) 200-25 MCG/ACT AEPB Inhale 1 puff into the lungs daily. 1 each 5  ipratropium-albuterol (DUONEB) 0.5-2.5 (3) MG/3ML SOLN USE 1 NEBULE IN NEBULIZER EVERY MORNING, NOON, EVENING, AND BEDTIME. 1080 mL 0   nitrofurantoin, macrocrystal-monohydrate, (MACROBID) 100 MG capsule Take 1 capsule (100 mg total) by mouth 2 (two) times daily. 20 capsule 0   triamcinolone acetonide (KENALOG-40) injection 40 mg      No facility-administered medications prior to visit.    Allergies  Allergen Reactions   Multaq [Dronedarone] Diarrhea   Klonopin [Clonazepam] Other (See Comments)    Pt can't remember reaction   Lipitor [Atorvastatin] Other (See Comments)    Pt could barely lift arms   Simvastatin Other (See Comments)    Pt can't remember reaction    Review of Systems CONSTITUTIONAL: Negative for chills, fatigue, fever,  E/N/T: Negative for ear pain, nasal congestion and sore throat.  CARDIOVASCULAR: Negative for chest pain, dizziness, palpitations and pedal edema.  RESPIRATORY: see HPI         Objective:  PHYSICAL EXAM:   VS: BP (!) 142/90 (BP Location: Left Arm, Patient Position: Sitting, Cuff Size: Normal)   Pulse 87   Temp (!) 97.1 F (36.2 C) (Temporal)   Ht 5' 3" (1.6 m)   Wt 136 lb (61.7 kg)   SpO2 97% Comment: 3L o2  BMI 24.09 kg/m   GEN: Well nourished, well developed, in no acute distress - pt feeling well today  Cardiac: RRR; no murmurs, rubs, or gallops,no edema -   Respiratory:  normal respiratory rate and pattern with no distress - normal breath sounds with no rales, rhonchi, wheezes or rubs  Psych: euthymic mood, appropriate affect and demeanor   Lab Results  Component Value Date   TSH 1.700 09/15/2021   Lab Results  Component Value Date   WBC 7.9 11/18/2021   HGB 12.8 11/18/2021   HCT 39.7 11/18/2021   MCV 95 11/18/2021   PLT 215 11/18/2021   Lab Results  Component Value Date   NA 140 11/18/2021   K 4.4 11/18/2021   CO2 31 (H) 11/18/2021   GLUCOSE 94 11/18/2021   BUN 9 11/18/2021   CREATININE 0.48 (L) 11/18/2021   BILITOT 0.3 11/18/2021   ALKPHOS 68 11/18/2021   AST 18 11/18/2021   ALT 13 11/18/2021   PROT 6.5 11/18/2021   ALBUMIN 4.3 11/18/2021   CALCIUM 9.6 11/18/2021   ANIONGAP 7 10/14/2019   EGFR 96 11/18/2021   Lab Results  Component Value Date   CHOL 156 09/15/2021   Lab Results  Component Value Date   HDL 53 09/15/2021   Lab Results  Component Value Date   LDLCALC 86 09/15/2021   Lab Results  Component Value Date   TRIG 91 09/15/2021   Lab Results  Component Value Date   CHOLHDL 2.9 09/15/2021   No results found for: "HGBA1C"     Assessment & Plan:   Problem List Items Addressed This Visit       Respiratory   COPD (chronic obstructive pulmonary disease) (HCC) - Primary   Relevant Medications   Fluticasone-Umeclidin-Vilant (TRELEGY ELLIPTA) 200-62.5-25 MCG/ACT AEPB   levalbuterol (XOPENEX) 0.31 MG/3ML nebulizer solution  Will stop Breo and try Trelegy - will change her duoneb to xopenex --- pt to call in 1-2 weeks and notify if these changes help   Meds ordered this encounter  Medications   Fluticasone-Umeclidin-Vilant (TRELEGY ELLIPTA) 200-62.5-25 MCG/ACT AEPB    Sig: Inhale 1 Inhalation into the lungs daily.    Dispense:  1 each    Refill:  2      Order Specific Question:   Supervising Provider    Answer:   Rochel Brome [[id number]]   levalbuterol (XOPENEX) 0.31 MG/3ML nebulizer solution     Sig: Take 3 mLs (0.31 mg total) by nebulization every 6 (six) hours as needed for wheezing.    Dispense:  3 mL    Refill:  12    Order Specific Question:   Supervising Provider    Answer:   Rochel Brome [id number]    No orders of the defined types were placed in this encounter.    Follow-up: Return for for chronic follow up. Or sooner if any symptoms change or worsen  An After Visit Summary was printed and given to the patient.  Yetta Flock [facility] [phone]

## 2022-01-27 ENCOUNTER — Telehealth: Payer: Self-pay

## 2022-01-27 ENCOUNTER — Other Ambulatory Visit: Payer: Self-pay | Admitting: Physician Assistant

## 2022-01-27 DIAGNOSIS — J438 Other emphysema: Secondary | ICD-10-CM

## 2022-01-27 MED ORDER — LEVALBUTEROL HCL 0.31 MG/3ML IN NEBU: 1.0000 | INHALATION_SOLUTION | Freq: Four times a day (QID) | RESPIRATORY_TRACT | 12 refills | Status: DC | PRN

## 2022-01-27 NOTE — Telephone Encounter (Signed)
Tina Jimenez called to let us know that they have not been able to fill her levalbuterol solution because it is unavailable.  Marge Duncans, PA was notified and the patient was instructed to check with other pharmacies and call us back.  The RX can be sent to a different pharmacy.

## 2022-01-28 ENCOUNTER — Other Ambulatory Visit: Payer: Self-pay | Admitting: Cardiology

## 2022-01-28 DIAGNOSIS — E782 Mixed hyperlipidemia: Secondary | ICD-10-CM

## 2022-01-28 DIAGNOSIS — I2510 Atherosclerotic heart disease of native coronary artery without angina pectoris: Secondary | ICD-10-CM

## 2022-02-05 ENCOUNTER — Telehealth: Payer: Self-pay

## 2022-02-05 NOTE — Telephone Encounter (Signed)
She stated she is not currently taking anything.

## 2022-02-05 NOTE — Telephone Encounter (Signed)
Patient called stating that she tried the Trelegy inhailer for one day and stopped it, she states that it hurt her chest. She states that she knows that you wanted her to try it for at least one week, but she was afraid to after that happened. Patient wants to know if you want her to change to something different or what you would prefer her to do. Please advise.

## 2022-02-08 DIAGNOSIS — J449 Chronic obstructive pulmonary disease, unspecified: Secondary | ICD-10-CM | POA: Diagnosis not present

## 2022-02-10 NOTE — Telephone Encounter (Signed)
Unable to reach patient.  [name], [place] 02/10/22 2:23 PM

## 2022-02-12 DIAGNOSIS — E785 Hyperlipidemia, unspecified: Secondary | ICD-10-CM | POA: Diagnosis not present

## 2022-02-12 DIAGNOSIS — J449 Chronic obstructive pulmonary disease, unspecified: Secondary | ICD-10-CM

## 2022-02-13 ENCOUNTER — Other Ambulatory Visit: Payer: Self-pay

## 2022-02-13 DIAGNOSIS — J438 Other emphysema: Secondary | ICD-10-CM

## 2022-02-13 MED ORDER — TRELEGY ELLIPTA 200-62.5-25 MCG/ACT IN AEPB: 1.0000 | INHALATION_SPRAY | Freq: Every day | RESPIRATORY_TRACT | 6 refills | Status: DC

## 2022-02-18 ENCOUNTER — Telehealth: Payer: Self-pay

## 2022-02-18 ENCOUNTER — Other Ambulatory Visit: Payer: Self-pay | Admitting: Physician Assistant

## 2022-02-18 MED ORDER — FLUTICASONE FUROATE-VILANTEROL 200-25 MCG/ACT IN AEPB: 1.0000 | INHALATION_SPRAY | Freq: Every day | RESPIRATORY_TRACT | 11 refills | Status: DC

## 2022-02-18 NOTE — Telephone Encounter (Signed)
Patient stated she is still using nebulizer 4 times a day, she stated she will like to try [facility]

## 2022-02-19 ENCOUNTER — Ambulatory Visit: Payer: Medicare Other | Admitting: Family Medicine

## 2022-02-19 ENCOUNTER — Encounter: Payer: Self-pay | Admitting: Family Medicine

## 2022-02-19 VITALS — Ht 63.0 in | Wt 136.0 lb

## 2022-02-19 DIAGNOSIS — Z0000 Encounter for general adult medical examination without abnormal findings: Secondary | ICD-10-CM | POA: Diagnosis not present

## 2022-02-19 NOTE — Progress Notes (Signed)
I connected with  Tina Jimenez on 02/19/22 by a audio enabled telemedicine application and verified that I am speaking with the correct person using two identifiers.  Patient Location: Home  Provider Location: Home Office  I discussed the limitations of evaluation and management by telemedicine. The patient expressed understanding and agreed to proceed.   Subjective:   Tina Jimenez is a 81 y.o. female who presents for Medicare Annual (Subsequent) preventive examination.  Review of Systems     Cardiac Risk Factors include: advanced age (>53mn, >>39women);hypertension;sedentary lifestyle;smoking/ tobacco exposure     Objective:    Today's Vitals   02/19/22 1342  Weight: 136 lb (61.7 kg)  Height: '5\' 3"'$  (1.6 m)   Body mass index is 24.09 kg/m.     02/19/2022    1:47 PM 10/13/2019   11:37 AM  Advanced Directives  Does Patient Have a Medical Advance Directive? Yes No  Type of Advance Directive Living will   Would patient like information on creating a medical advance directive?  No - Patient declined    Current Medications (verified) Outpatient Encounter Medications as of 02/19/2022  Medication Sig   Ascorbic Acid (VITAMIN C) 500 MG CAPS Take 1 tablet by mouth daily.   aspirin EC 81 MG tablet Take 81 mg by mouth every other day. Swallow whole.   aspirin-acetaminophen-caffeine (EXCEDRIN MIGRAINE) 250-250-65 MG tablet Take 1-2 tablets by mouth every 6 (six) hours as needed for headache.   Bempedoic Acid-Ezetimibe (NEXLIZET) 180-10 MG TABS Take 1 tablet by mouth daily in the afternoon.   carvedilol (COREG) 6.25 MG tablet TAKE 1 TABLET BY MOUTH TWICE A DAY   Co-Enzyme Q-10 100 MG CAPS Take 100 mg by mouth daily.   digoxin (LANOXIN) 0.125 MG tablet Take 1 tablet (125 mcg total) by mouth daily.   famotidine (PEPCID) 20 MG tablet Take 20 mg by mouth daily.   ferrous sulfate 325 (65 FE) MG tablet TAKE 1 TABLET BY MOUTH EVERY DAY WITH BREAKFAST   fluticasone (FLONASE)  50 MCG/ACT nasal spray SPRAY 2 SPRAYS INTO EACH NOSTRIL EVERY DAY   fluticasone furoate-vilanterol (BREO ELLIPTA) 200-25 MCG/ACT AEPB Inhale 1 puff into the lungs daily.   loratadine (CLARITIN) 10 MG tablet TAKE 1 TABLET BY MOUTH ONCE DAILY FOR ALLERGIES   omeprazole (PRILOSEC) 20 MG capsule TAKE 1 CAPSULE BY MOUTH EVERY DAY   ondansetron (ZOFRAN) 4 MG tablet Take 4 mg by mouth every 8 (eight) hours as needed for nausea/vomiting.   polyethylene glycol (MIRALAX / GLYCOLAX) 17 g packet Take 17 g by mouth daily.   triamcinolone cream (KENALOG) 0.1 % APPLY TO AFFECTED AREA TWICE A DAY   VITAMIN D PO Take 1 tablet by mouth daily.   No facility-administered encounter medications on file as of 02/19/2022.    Allergies (verified) Multaq [dronedarone], Klonopin [clonazepam], Lipitor [atorvastatin], and Simvastatin   History: Past Medical History:  Diagnosis Date   Abdominal pain 10/13/2019   Abnormal blood chemistry 08/22/2019   Acute blood loss anemia 06/22/2019   Acute bronchitis with COPD ([facility] 08/15/2019   Acute cystitis with hematuria 10/26/2019   Acute embolism and thrombosis of deep veins of left upper extremity ([facility] 07/08/2019   Acute hypoxemic respiratory failure ([facility] 07/08/2019   Acute pain of left shoulder 10/26/2019   Acute posthemorrhagic anemia 07/08/2019   Acute pulmonary embolism without acute cor pulmonale ([facility] 06/21/2019   Adnexal mass    Anemia 01/08/2020   Arm DVT (deep venous thromboembolism), acute, left (  [place]) 06/26/2019   Atrial fibrillation ([place])    Bladder mass 10/26/2019   Compression fracture of body of thoracic vertebra ([place]) 2020   COPD (chronic obstructive pulmonary disease) (HCC)    Coronary artery calcification seen on CT scan 05/23/2020   COVID-19 07/08/2019   Dependence on supplemental oxygen 04/14/2020   Effusion, left ankle 08/03/2019   Effusion, left foot 08/03/2019   Effusion, right ankle 08/03/2019   Effusion, right foot 08/03/2019   Elevated white blood cell count,  unspecified 07/11/2019   Encounter for other orthopedic aftercare 07/08/2019   Essential (primary) hypertension 07/08/2019   Gastro-esophageal reflux disease without esophagitis 07/08/2019   GERD (gastroesophageal reflux disease)    History of pulmonary embolism 10/02/2019   Hyperlipemia    Hypertension 08/15/2019   Localized edema 08/22/2019   Mixed dyslipidemia 05/23/2020   Muscle weakness (generalized) 07/08/2019   Osteoporosis    Other disorders of electrolyte and fluid balance, not elsewhere classified 08/10/2019   Other fatigue 01/08/2020   Oxygen deficiency    Paroxysmal atrial fibrillation (HCC) 06/20/2019   Persistent atrial fibrillation ([place]) 08/22/2019   Pneumonia 05/2019   COVID Pneumonia   Pneumonia due to COVID-19 virus 07/08/2019   Precordial pain 06/20/2019   Pulmonary embolism ([place]) 05/2019   Spinal stenosis of lumbar region 06/14/2019   Spinal stenosis, lumbar region without neurogenic claudication 06/14/2019   Unsteadiness on feet 07/08/2019   Vitamin D deficiency 06/22/2019   [place] compression fracture of unspecified lumbar vertebra, subsequent encounter for fracture with routine healing 07/08/2019   Past Surgical History:  Procedure Laterality Date   APPENDECTOMY     KYPHOPLASTY     Family History  Problem Relation Age of Onset   Breast cancer Sister    Lung cancer Sister    Lung cancer Daughter    Social History   Socioeconomic History   Marital status: Widowed    Spouse name: Not on file   Number of children: 4   Years of education: Not on file   Highest education level: Not on file  Occupational History   Occupation: retired  Tobacco Use   Smoking status: Former    Types: Cigarettes    Quit date: 11/01/2018    Years since quitting: 3.3   Smokeless tobacco: Never  Vaping Use   Vaping Use: Never used  Substance and Sexual Activity   Alcohol use: Never   Drug use: Never   Sexual activity: Not on file  Other Topics Concern   Not on file  Social History  Narrative   Not on file   Social Determinants of Health   Financial Resource Strain: [place] (02/19/2022)   Overall Financial Resource Strain (CARDIA)    Difficulty of Paying Living Expenses: Somewhat hard  Food Insecurity: No Food Insecurity (02/19/2022)   Hunger Vital Sign    Worried About Running Out of Food in the Last Year: Never true    Ran Out of Food in the Last Year: Never true  Transportation Needs: No Transportation Needs (02/19/2022)   PRAPARE - Hydrologist (Medical): No    Lack of Transportation (Non-Medical): No  Physical Activity: Inactive (02/19/2022)   Exercise Vital Sign    Days of Exercise per Week: 0 days    Minutes of Exercise per Session: 0 min  Stress: No Stress Concern Present (02/19/2022)   [place]    Feeling of Stress : Only a little  Social  Connections: Socially Isolated (02/19/2022)   Social Connection and Isolation Panel [NHANES]    Frequency of Communication with Friends and Family: Three times a week    Frequency of Social Gatherings with Friends and Family: Three times a week    Attends Religious Services: Never    Active Member of Clubs or Organizations: No    Attends Archivist Meetings: Never    Marital Status: Widowed    Tobacco Counseling Counseling given: Not Answered   Clinical Intake:                 Diabetic? no         Activities of Daily Living    02/19/2022    1:48 PM  In your present state of health, do you have any difficulty performing the following activities:  Hearing? 0  Vision? 1  Difficulty concentrating or making decisions? 0  Walking or climbing stairs? 0  Dressing or bathing? 0  Doing errands, shopping? 0  Preparing Food and eating ? N  Using the Toilet? N  In the past six months, have you accidently leaked urine? N  Do you have problems with loss of bowel control? N  Managing your Medications? N   Managing your Finances? N  Housekeeping or managing your Housekeeping? N    Patient Care Team: Marge Duncans, Hershal Coria as PCP - General (Physician Assistant) Lane Hacker, [facility] as Pharmacist (Pharmacist)  Indicate any recent Medical Services you may have received from other than [facility] providers in the past year (date may be approximate).     Assessment:   This is a routine wellness examination for Mack[place].  Hearing/Vision screen No results found.  Dietary issues and exercise activities discussed: Current Exercise Habits: The patient does not participate in regular exercise at present   Goals Addressed   None   Depression Screen    02/19/2022    1:46 PM 09/15/2021    9:52 AM 05/21/2020    2:13 PM 04/08/2020   10:31 AM  PHQ 2/9 Scores  PHQ - 2 Score 0 0 0 0    Fall Risk    02/19/2022    1:47 PM 09/15/2021    9:52 AM 08/22/2019    1:53 PM  [place] in the past year? 0 0 0  Number falls in past yr: 0 0 0  Injury with Fall? 0 0 0  Risk for fall due to : No Fall Risks;Impaired balance/gait  Impaired balance/gait  Follow up Falls evaluation completed;Education provided;Falls prevention discussed  Falls prevention discussed    FALL RISK PREVENTION PERTAINING TO THE HOME:  Any stairs in or around the home? Yes  If so, are there any without handrails? Yes  Home free of loose throw rugs in walkways, pet beds, electrical cords, etc? Yes  Adequate lighting in your home to reduce risk of falls? Yes   ASSISTIVE DEVICES UTILIZED TO PREVENT FALLS:  Life alert? No  Use of a cane, walker or w/c? Yes  Grab bars in the bathroom? Yes  Shower chair or bench in shower? Yes  Elevated toilet seat or a handicapped toilet? No    Cognitive Function:        02/19/2022    1:50 PM  6CIT Screen  What Year? 0 points  What month? 0 points  What time? 0 points  Count back from 20 0 points  Months in reverse 0 points  Repeat phrase 0 points  Total Score 0 points  Immunizations Immunization History  Administered Date(s) Administered   Fluad Quad(high Dose 65+) 04/08/2020, 03/17/2021   Influenza, High Dose Seasonal PF 05/13/2016   PFIZER(Purple Top)SARS-COV-2 Vaccination 04/11/2020, 05/02/2020    TDAP status: Due, Education has been provided regarding the importance of this vaccine. Advised may receive this vaccine at local pharmacy or Health Dept. Aware to provide a copy of the vaccination record if obtained from local pharmacy or Health Dept. Verbalized acceptance and understanding.  Flu Vaccine status: Due, Education has been provided regarding the importance of this vaccine. Advised may receive this vaccine at local pharmacy or Health Dept. Aware to provide a copy of the vaccination record if obtained from local pharmacy or Health Dept. Verbalized acceptance and understanding.  Pneumococcal vaccine status: Up to date  Covid-19 vaccine status: Information provided on how to obtain vaccines.   Qualifies for Shingles Vaccine? Yes   Zostavax completed No   Shingrix Completed?: No.    Education has been provided regarding the importance of this vaccine. Patient has been advised to call insurance company to determine out of pocket expense if they have not yet received this vaccine. Advised may also receive vaccine at local pharmacy or Health Dept. Verbalized acceptance and understanding.  Screening Tests Health Maintenance  Topic Date Due   Zoster Vaccines- Shingrix (1 of 2) Never done   COVID-19 Vaccine (3 - Pfizer risk series) 05/30/2020   INFLUENZA VACCINE  01/13/2022   DEXA SCAN  03/17/2022 (Originally 08/09/2007)   TETANUS/TDAP  03/17/2022 (Originally 08/08/1959)   Pneumonia Vaccine 66+ Years old (1 - PCV) 09/16/2022 (Originally 08/08/1946)   HPV VACCINES  Aged Out   Hepatitis C Screening  Discontinued    Health Maintenance  Health Maintenance Due  Topic Date Due   Zoster Vaccines- Shingrix (1 of 2) Never done   COVID-19 Vaccine (3 -  Pfizer risk series) 05/30/2020   INFLUENZA VACCINE  01/13/2022    Colorectal cancer screening: No longer required.  Pt reports never having   Mammogram Declined  Dexa Scan Declined  Lung Cancer Screening: (Low Dose CT Chest recommended if Age 70-80 years, 30 pack-year currently smoking OR have quit w/in 15years.) does qualify.     Additional Screening:  Hepatitis C Screening: does qualify;   Vision Screening: Recommended annual ophthalmology exams for early detection of glaucoma and other disorders of the eye. Is the patient up to date with their annual eye exam?  No  Who is the provider or what is the name of the office in which the patient attends annual eye exams? On [address]- unsure of name    Dental Screening: Recommended annual dental exams for proper oral hygiene  Community Resource Referral / Chronic Care Management: CRR required this visit?  No   CCM required this visit?  No      Plan:     I have personally reviewed and noted the following in the patient's chart:   Medical and social history Use of alcohol, tobacco or illicit drugs  Current medications and supplements including opioid prescriptions. Patient is not currently taking opioid prescriptions. Functional ability and status Nutritional status Physical activity Advanced directives List of other physicians Hospitalizations, surgeries, and ER visits in previous 12 months Vitals Screenings to include cognitive, depression, and falls Referrals and appointments  In addition, I have reviewed and discussed with patient certain preventive protocols, quality metrics, and best practice recommendations. A written personalized care plan for preventive services as well as general preventive health recommendations were provided to  patient.     Perlie Mayo, NP   02/19/2022

## 2022-02-19 NOTE — Chronic Care Management (AMB) (Signed)
[name], CMA called patient to request out of pocket prescription expense report to be sent to [place] patient assistance program for Trelegy applicaton. Application was faxed last week but they will still need OOP report. Per CMA: Pt has been informed and will have her pharmacy or insurance company to fax this to the office but stated she had left a message for Marge Duncans in regards to her Trelegy inhaler. I asked pt the questions that were in the last encounter and she stated the Albuterol nebulizer upsets her stomach an hour after using it and the Trelegy made her feel anxious and felt a jolt in her chest after using it. She stated she is still taking the [facility]  and has not had any issues. She is not currently using Trelegy right now. She has not used it since last week. Pt used her Nebulizer today and felt anxious and gets an upset stomach. Please advise.   Message was also sent to Clinical team for advisement, will cancel application to [place].   Pattricia Boss, [place] Pharmacist Assistant [phone]

## 2022-02-19 NOTE — Patient Instructions (Signed)
[name] , Thank you for taking time to come for your Medicare Wellness Visit. I appreciate your ongoing commitment to your health goals. Please review the following plan we discussed and let me know if I can assist you in the future.   Screening recommendations/referrals: Recommended yearly ophthalmology/optometry visit for glaucoma screening and checkup Recommended yearly dental visit for hygiene and checkup  Vaccinations: Influenza vaccine: Due    Advanced directives: Living Will  Conditions/risks identified: Falls   Next appointment: As scheduled   Preventive Care 79 Years and Older, Female Preventive care refers to lifestyle choices and visits with your health care provider that can promote health and wellness. What does preventive care include? A yearly physical exam. This is also called an annual well check. Dental exams once or twice a year. Routine eye exams. Ask your health care provider how often you should have your eyes checked. Personal lifestyle choices, including: Daily care of your teeth and gums. Regular physical activity. Eating a healthy diet. Avoiding tobacco and drug use. Limiting alcohol use. Practicing safe sex. Taking low-dose aspirin every day. Taking vitamin and mineral supplements as recommended by your health care provider. What happens during an annual well check? The services and screenings done by your health care provider during your annual well check will depend on your age, overall health, lifestyle risk factors, and family history of disease. Counseling  Your health care provider may ask you questions about your: Alcohol use. Tobacco use. Drug use. Emotional well-being. Home and relationship well-being. Sexual activity. Eating habits. History of falls. Memory and ability to understand (cognition). Work and work Statistician. Reproductive health. Screening  You may have the following tests or measurements: Height, weight, and  BMI. Blood pressure. Lipid and cholesterol levels. These may be checked every 5 years, or more frequently if you are over 68 years old. Skin check. Lung cancer screening. You may have this screening every year starting at age 65 if you have a 30-pack-year history of smoking and currently smoke or have quit within the past 15 years. Fecal occult blood test (FOBT) of the stool. You may have this test every year starting at age 79. Flexible sigmoidoscopy or colonoscopy. You may have a sigmoidoscopy every 5 years or a colonoscopy every 10 years starting at age 79. Hepatitis C blood test. Hepatitis B blood test. Sexually transmitted disease (STD) testing. Diabetes screening. This is done by checking your blood sugar (glucose) after you have not eaten for a while (fasting). You may have this done every 1-3 years. Bone density scan. This is done to screen for osteoporosis. You may have this done starting at age 79. Mammogram. This may be done every 1-2 years. Talk to your health care provider about how often you should have regular mammograms. Talk with your health care provider about your test results, treatment options, and if necessary, the need for more tests. Vaccines  Your health care provider may recommend certain vaccines, such as: Influenza vaccine. This is recommended every year. Tetanus, diphtheria, and acellular pertussis (Tdap, Td) vaccine. You may need a Td booster every 10 years. Zoster vaccine. You may need this after age 79. Pneumococcal 13-valent conjugate (PCV13) vaccine. One dose is recommended after age 79. Pneumococcal polysaccharide (PPSV23) vaccine. One dose is recommended after age 79. Talk to your health care provider about which screenings and vaccines you need and how often you need them. This information is not intended to replace advice given to you by your health care provider. Make  sure you discuss any questions you have with your health care provider. Document  Released: 06/28/2015 Document Revised: 02/19/2016 Document Reviewed: 04/02/2015 Elsevier Interactive Patient Education  2017 [place] Prevention in the Home Falls can cause injuries. They can happen to people of all ages. There are many things you can do to make your home safe and to help prevent falls. What can I do on the outside of my home? Regularly fix the edges of walkways and driveways and fix any cracks. Remove anything that might make you trip as you walk through a door, such as a raised step or threshold. Trim any bushes or trees on the path to your home. Use bright outdoor lighting. Clear any walking paths of anything that might make someone trip, such as rocks or tools. Regularly check to see if handrails are loose or broken. Make sure that both sides of any steps have handrails. Any raised decks and porches should have guardrails on the edges. Have any leaves, snow, or ice cleared regularly. Use sand or salt on walking paths during winter. Clean up any spills in your garage right away. This includes oil or grease spills. What can I do in the bathroom? Use night lights. Install grab bars by the toilet and in the tub and shower. Do not use towel bars as grab bars. Use non-skid mats or decals in the tub or shower. If you need to sit down in the shower, use a plastic, non-slip stool. Keep the floor dry. Clean up any water that spills on the floor as soon as it happens. Remove soap buildup in the tub or shower regularly. Attach bath mats securely with double-sided non-slip rug tape. Do not have throw rugs and other things on the floor that can make you trip. What can I do in the bedroom? Use night lights. Make sure that you have a light by your bed that is easy to reach. Do not use any sheets or blankets that are too big for your bed. They should not hang down onto the floor. Have a firm chair that has side arms. You can use this for support while you get dressed. Do  not have throw rugs and other things on the floor that can make you trip. What can I do in the kitchen? Clean up any spills right away. Avoid walking on wet floors. Keep items that you use a lot in easy-to-reach places. If you need to reach something above you, use a strong step stool that has a grab bar. Keep electrical cords out of the way. Do not use floor polish or wax that makes floors slippery. If you must use wax, use non-skid floor wax. Do not have throw rugs and other things on the floor that can make you trip. What can I do with my stairs? Do not leave any items on the stairs. Make sure that there are handrails on both sides of the stairs and use them. Fix handrails that are broken or loose. Make sure that handrails are as long as the stairways. Check any carpeting to make sure that it is firmly attached to the stairs. Fix any carpet that is loose or worn. Avoid having throw rugs at the top or bottom of the stairs. If you do have throw rugs, attach them to the floor with carpet tape. Make sure that you have a light switch at the top of the stairs and the bottom of the stairs. If you do not have them, ask  someone to add them for you. What else can I do to help prevent falls? Wear shoes that: Do not have high heels. Have rubber bottoms. Are comfortable and fit you well. Are closed at the toe. Do not wear sandals. If you use a stepladder: Make sure that it is fully opened. Do not climb a closed stepladder. Make sure that both sides of the stepladder are locked into place. Ask someone to hold it for you, if possible. Clearly mark and make sure that you can see: Any grab bars or handrails. First and last steps. Where the edge of each step is. Use tools that help you move around (mobility aids) if they are needed. These include: Canes. Walkers. Scooters. Crutches. Turn on the lights when you go into a dark area. Replace any light bulbs as soon as they burn out. Set up your  furniture so you have a clear path. Avoid moving your furniture around. If any of your floors are uneven, fix them. If there are any pets around you, be aware of where they are. Review your medicines with your doctor. Some medicines can make you feel dizzy. This can increase your chance of falling. Ask your doctor what other things that you can do to help prevent falls. This information is not intended to replace advice given to you by your health care provider. Make sure you discuss any questions you have with your health care provider. Document Released: 03/28/2009 Document Revised: 11/07/2015 Document Reviewed: 07/06/2014 Elsevier Interactive Patient Education  2017 [employer].

## 2022-02-23 ENCOUNTER — Telehealth: Payer: Self-pay

## 2022-02-23 NOTE — Telephone Encounter (Signed)
Patient called and stated that she picked up the breo 200/25 mg and that it hurt her chest. She stated that she was originally on the 100/'25mg'$  and she wants to go back to that dose.

## 2022-02-23 NOTE — Telephone Encounter (Signed)
Patient stated after she inhale the breo, she start to having chest pain, and funny feeling in her abdomen no other symptoms.

## 2022-02-23 NOTE — Telephone Encounter (Signed)
What does she mean by it hurt her chest? - there is nothing that should be causing chest pain Please have her describe symptoms more

## 2022-02-23 NOTE — Telephone Encounter (Signed)
Patient made aware, she will call back at the beginning of the week to make an appointment her son is out of town and want be back till next week.

## 2022-02-26 ENCOUNTER — Telehealth: Payer: Self-pay

## 2022-02-26 NOTE — Chronic Care Management (AMB) (Signed)
Chronic Care Management Pharmacy Assistant   Name: Tina Jimenez  MRN: [id number] DOB: [birth year]  Reason for Encounter: Disease State/COPD  Recent office visits:  02-19-2022 Perlie Mayo, NP. Medicare wellness visit.  01-23-2022 Marge Duncans, PA-C. STOP breo ellipta and Duo nebulizer. COMPLETED macrobid and diflucan. START trelegy ellipta inhale once daily.  Recent consult visits:  None  Hospital visits:  None in previous 6 months  Medications: Outpatient Encounter Medications as of 02/26/2022  Medication Sig   Ascorbic Acid (VITAMIN C) 500 MG CAPS Take 1 tablet by mouth daily.   aspirin EC 81 MG tablet Take 81 mg by mouth every other day. Swallow whole.   aspirin-acetaminophen-caffeine (EXCEDRIN MIGRAINE) 250-250-65 MG tablet Take 1-2 tablets by mouth every 6 (six) hours as needed for headache.   Bempedoic Acid-Ezetimibe (NEXLIZET) 180-10 MG TABS Take 1 tablet by mouth daily in the afternoon.   carvedilol (COREG) 6.25 MG tablet TAKE 1 TABLET BY MOUTH TWICE A DAY   Co-Enzyme Q-10 100 MG CAPS Take 100 mg by mouth daily.   digoxin (LANOXIN) 0.125 MG tablet Take 1 tablet (125 mcg total) by mouth daily.   famotidine (PEPCID) 20 MG tablet Take 20 mg by mouth daily.   ferrous sulfate 325 (65 FE) MG tablet TAKE 1 TABLET BY MOUTH EVERY DAY WITH BREAKFAST   fluticasone (FLONASE) 50 MCG/ACT nasal spray SPRAY 2 SPRAYS INTO EACH NOSTRIL EVERY DAY   fluticasone furoate-vilanterol (BREO ELLIPTA) 200-25 MCG/ACT AEPB Inhale 1 puff into the lungs daily.   loratadine (CLARITIN) 10 MG tablet TAKE 1 TABLET BY MOUTH ONCE DAILY FOR ALLERGIES   omeprazole (PRILOSEC) 20 MG capsule TAKE 1 CAPSULE BY MOUTH EVERY DAY   ondansetron (ZOFRAN) 4 MG tablet Take 4 mg by mouth every 8 (eight) hours as needed for nausea/vomiting.   polyethylene glycol (MIRALAX / GLYCOLAX) 17 g packet Take 17 g by mouth daily.   triamcinolone cream (KENALOG) 0.1 % APPLY TO AFFECTED AREA TWICE A DAY   VITAMIN D PO Take 1  tablet by mouth daily.   No facility-administered encounter medications on file as of 02/26/2022.   Current COPD regimen: Breo ellipta 100-25 mcg 1 puff daily (Patient was taking 200-25 mcg but was having issues) IPRAT-ALBUT 0.5-3(2.5) MG/3 ML USE 1 NEBULE IN NEBULIZER EVERY MORNING, NOON, EVENING, AND BEDTIME.      No data to display          Any recent hospitalizations or ED visits since last visit with CPP? No  reports the following COPD symptoms, including Increased shortness of breath  and Wheezing   What recent interventions/DTPs have been made by any provider to improve breathing since last visit:   Have you had exacerbation/flare-up since last visit? Yes  What do you do when you are short of breath?  Rest  Current tobacco use? Interested in cessation? No  Respiratory Devices/Equipment Do you have a nebulizer? Yes Do you use a Peak Flow Meter? No Do you use a maintenance inhaler? Yes How often do you forget to use your daily inhaler? Never Do you use a rescue inhaler? Yes How often do you use your rescue inhaler?  multiple times per day Do you use a spacer with your inhaler? No  Adherence Review: Does the patient have >5 day gap between last estimated fill date for maintenance inhaler medications? No   NOTES: Patient is back taking breo ellipta 100-25 mcg 1 puff daily which was picked up on 02-24-2022 30 DS. Patient reports  issues with 200-25 mcg and had refills for 100-25 mg to use. Patient stated copay was $109 and would like patient assistance on medication. Pattricia Boss informed me that she has application for trelegy that patient denied and can be switched to breo. Patient will need to bring an OOP expense report to office visit next week. Patient stated she is afraid she won't have transportation to [facility] to get expense report. [facility]ed [facility] to see if report could be faxed to office and was told she has to pick it up. Patient stated she will call [facility] to ask if son  could come by to pick report up.   Care Gaps: Last annual wellness visit? 02-19-2022 Shingrix overdue Covid booster overdue Flu vaccine overdue  Star Rating Drugs: None  Jeannette How [facility] Clinical Pharmacist Assistant [phone]

## 2022-02-28 ENCOUNTER — Other Ambulatory Visit: Payer: Self-pay | Admitting: Physician Assistant

## 2022-03-03 ENCOUNTER — Ambulatory Visit (INDEPENDENT_AMBULATORY_CARE_PROVIDER_SITE_OTHER): Payer: Medicare Other | Admitting: Physician Assistant

## 2022-03-03 ENCOUNTER — Encounter: Payer: Self-pay | Admitting: Physician Assistant

## 2022-03-03 VITALS — BP 140/88 | HR 61 | Temp 96.8°F | Ht 61.0 in | Wt 138.0 lb

## 2022-03-03 DIAGNOSIS — R0789 Other chest pain: Secondary | ICD-10-CM

## 2022-03-03 DIAGNOSIS — J449 Chronic obstructive pulmonary disease, unspecified: Secondary | ICD-10-CM | POA: Diagnosis not present

## 2022-03-03 DIAGNOSIS — J438 Other emphysema: Secondary | ICD-10-CM | POA: Diagnosis not present

## 2022-03-03 DIAGNOSIS — I480 Paroxysmal atrial fibrillation: Secondary | ICD-10-CM

## 2022-03-04 ENCOUNTER — Telehealth: Payer: Self-pay

## 2022-03-04 ENCOUNTER — Encounter: Payer: Self-pay | Admitting: Physician Assistant

## 2022-03-04 ENCOUNTER — Other Ambulatory Visit: Payer: Self-pay | Admitting: Physician Assistant

## 2022-03-04 DIAGNOSIS — J438 Other emphysema: Secondary | ICD-10-CM | POA: Insufficient documentation

## 2022-03-04 DIAGNOSIS — R0789 Other chest pain: Secondary | ICD-10-CM | POA: Insufficient documentation

## 2022-03-04 HISTORY — DX: Other chest pain: R07.89

## 2022-03-04 HISTORY — DX: Other emphysema: J43.8

## 2022-03-04 MED ORDER — FLUTICASONE FUROATE-VILANTEROL 200-25 MCG/ACT IN AEPB
1.0000 | INHALATION_SPRAY | Freq: Every day | RESPIRATORY_TRACT | 3 refills | Status: DC
Start: 2022-03-04 — End: 2022-03-16

## 2022-03-04 NOTE — Telephone Encounter (Signed)
Thank you!   Tamala Melvin, CMA Clinical Pharmacist Assistant [phone]  

## 2022-03-04 NOTE — Telephone Encounter (Signed)
Prescription record from [facility] was faxed to [place] as requested.

## 2022-03-04 NOTE — Progress Notes (Signed)
Subjective:  Patient ID: Tina Jimenez, female    DOB: 21-Sep-1940  Age: 81 y.o. MRN: [id number]  Chief Complaint  Patient presents with   Breathing Problem    HPI  Pt in today to discuss her current  treatments for COPD - there have been multiple calls and messages from patient stating that she had not been tolerating her albuterol neb treatment (says it made her shakey) but insurance would not cover xopenex so she continues on this medication as needed. Pt stated several times that Breo was not helpful and she wanted to stop it.  She tried Trelegy once and said it made her chest hurt.  Advised that was probably not the case but pt did not want to try again and said she wanted Breo once again --- the dose was increased to [facility] 200/25 and although she stated it had hurt her chest once she comes in today stating she is feeling better and actually likes the Breo 200/'25mg'$  dose and wants to continue it. She did bring in financial papers so that she can hopefully get patient assistance for this medication  Pt with long standing history of atrial fibrillation.  She was advised at last visit to make cardiology follow up but pt never made appt.  She is agreeable to schedule at this time In June pt had symptoms of dyspnea, elevated proBNP and edema -- those symptoms have improved however it has been a few years since her last echocardiogram and is due for cardiology follow up Current Outpatient Medications on File Prior to Visit  Medication Sig Dispense Refill   Ascorbic Acid (VITAMIN C) 500 MG CAPS Take 1 tablet by mouth daily.     aspirin EC 81 MG tablet Take 81 mg by mouth every other day. Swallow whole.     aspirin-acetaminophen-caffeine (EXCEDRIN MIGRAINE) 250-250-65 MG tablet Take 1-2 tablets by mouth every 6 (six) hours as needed for headache.     Bempedoic Acid-Ezetimibe (NEXLIZET) 180-10 MG TABS Take 1 tablet by mouth daily in the afternoon. 90 tablet 1   carvedilol (COREG) 6.25 MG tablet  TAKE 1 TABLET BY MOUTH TWICE A DAY 180 tablet 0   Co-Enzyme Q-10 100 MG CAPS Take 100 mg by mouth daily.     digoxin (LANOXIN) 0.125 MG tablet Take 1 tablet (125 mcg total) by mouth daily. 90 tablet 3   famotidine (PEPCID) 20 MG tablet Take 20 mg by mouth daily.     ferrous sulfate 325 (65 FE) MG tablet TAKE 1 TABLET BY MOUTH EVERY DAY WITH BREAKFAST 90 tablet 1   fluticasone (FLONASE) 50 MCG/ACT nasal spray SPRAY 2 SPRAYS INTO EACH NOSTRIL EVERY DAY 48 mL 2   loratadine (CLARITIN) 10 MG tablet TAKE 1 TABLET BY MOUTH ONCE DAILY FOR ALLERGIES 90 tablet 1   ondansetron (ZOFRAN) 4 MG tablet Take 4 mg by mouth every 8 (eight) hours as needed for nausea/vomiting.     polyethylene glycol (MIRALAX / GLYCOLAX) 17 g packet Take 17 g by mouth daily.     triamcinolone cream (KENALOG) 0.1 % APPLY TO AFFECTED AREA TWICE A DAY 30 g 0   VITAMIN D PO Take 1 tablet by mouth daily.     No current facility-administered medications on file prior to visit.   Past Medical History:  Diagnosis Date   Abdominal pain 10/13/2019   Abnormal blood chemistry 08/22/2019   Acute blood loss anemia 06/22/2019   Acute bronchitis with COPD ([place]) 08/15/2019   Acute cystitis  with hematuria 10/26/2019   Acute embolism and thrombosis of deep veins of left upper extremity ([place]) 07/08/2019   Acute hypoxemic respiratory failure ([place]) 07/08/2019   Acute pain of left shoulder 10/26/2019   Acute posthemorrhagic anemia 07/08/2019   Acute pulmonary embolism without acute cor pulmonale ([place]) 06/21/2019   Adnexal mass    Anemia 01/08/2020   Arm DVT (deep venous thromboembolism), acute, left (HCC) 06/26/2019   Atrial fibrillation (HCC)    Bladder mass 10/26/2019   Compression fracture of body of thoracic vertebra ([place]) 2020   COPD (chronic obstructive pulmonary disease) (HCC)    Coronary artery calcification seen on CT scan 05/23/2020   COVID-19 07/08/2019   Dependence on supplemental oxygen 04/14/2020   Effusion, left ankle 08/03/2019   Effusion,  left foot 08/03/2019   Effusion, right ankle 08/03/2019   Effusion, right foot 08/03/2019   Elevated white blood cell count, unspecified 07/11/2019   Encounter for other orthopedic aftercare 07/08/2019   Essential (primary) hypertension 07/08/2019   Gastro-esophageal reflux disease without esophagitis 07/08/2019   GERD (gastroesophageal reflux disease)    History of pulmonary embolism 10/02/2019   Hyperlipemia    Hypertension 08/15/2019   Localized edema 08/22/2019   Mixed dyslipidemia 05/23/2020   Muscle weakness (generalized) 07/08/2019   Osteoporosis    Other disorders of electrolyte and fluid balance, not elsewhere classified 08/10/2019   Other fatigue 01/08/2020   Oxygen deficiency    Paroxysmal atrial fibrillation (HCC) 06/20/2019   Persistent atrial fibrillation ([place]) 08/22/2019   Pneumonia 05/2019   COVID Pneumonia   Pneumonia due to COVID-19 virus 07/08/2019   Precordial pain 06/20/2019   Pulmonary embolism ([name]) 05/2019   Spinal stenosis of lumbar region 06/14/2019   Spinal stenosis, lumbar region without neurogenic claudication 06/14/2019   Unsteadiness on feet 07/08/2019   Vitamin D deficiency 06/22/2019   Wedge compression fracture of unspecified lumbar vertebra, subsequent encounter for fracture with routine healing 07/08/2019   Past Surgical History:  Procedure Laterality Date   APPENDECTOMY     KYPHOPLASTY      Family History  Problem Relation Age of Onset   Breast cancer Sister    Lung cancer Sister    Lung cancer Daughter    Social History   Socioeconomic History   Marital status: Widowed    Spouse name: Not on file   Number of children: 4   Years of education: Not on file   Highest education level: Not on file  Occupational History   Occupation: retired  Tobacco Use   Smoking status: Former    Types: Cigarettes    Quit date: 11/01/2018    Years since quitting: 3.3   Smokeless tobacco: Never  Vaping Use   Vaping Use: Never used  Substance and Sexual Activity    Alcohol use: Never   Drug use: Never   Sexual activity: Not on file  Other Topics Concern   Not on file  Social History Narrative   Not on file   Social Determinants of Health   Financial Resource Strain: [place] (02/19/2022)   Overall Financial Resource Strain (CARDIA)    Difficulty of Paying Living Expenses: Somewhat hard  Food Insecurity: No Food Insecurity (02/19/2022)   Hunger Vital Sign    Worried About Running Out of Food in the Last Year: Never true    Ran Out of Food in the Last Year: Never true  Transportation Needs: No Transportation Needs (02/19/2022)   PRAPARE - Transportation    Lack of Transportation (  Medical): No    Lack of Transportation (Non-Medical): No  Physical Activity: Inactive (02/19/2022)   Exercise Vital Sign    Days of Exercise per Week: 0 days    Minutes of Exercise per Session: 0 min  Stress: No Stress Concern Present (02/19/2022)   [place]    Feeling of Stress : Only a little  Social Connections: Socially Isolated (02/19/2022)   Social Connection and Isolation Panel [NHANES]    Frequency of Communication with Friends and Family: Three times a week    Frequency of Social Gatherings with Friends and Family: Three times a week    Attends Religious Services: Never    Active Member of Clubs or Organizations: No    Attends Archivist Meetings: Never    Marital Status: Widowed    Review of Systems CONSTITUTIONAL: Negative for chills, fatigue, fever, unintentional weight gain and unintentional weight loss.  E/N/T: Negative for ear pain, nasal congestion and sore throat.  CARDIOVASCULAR: Negative for chest pain, dizziness, palpitations and pedal edema.  RESPIRATORY: see HPI GASTROINTESTINAL: Negative for abdominal pain, acid reflux symptoms, constipation, diarrhea, nausea and vomiting.  MSK: Negative for arthralgias and myalgias.     Objective:  PHYSICAL EXAM:   VS: BP (!)  140/88 (BP Location: Left Arm, Patient Position: Sitting, Cuff Size: Normal)   Pulse 61   Temp (!) 96.8 F (36 C) (Temporal)   Ht '5\' 1"'$  (1.549 m)   Wt 138 lb (62.6 kg)   SpO2 97%   BMI 26.07 kg/m   GEN: Well nourished, well developed, in no acute distress  Cardiac: RRR; no murmurs, rubs, or gallops,no edema -  Respiratory:  normal respiratory rate and pattern with no distress - normal breath sounds with no rales, rhonchi, wheezes or rubs  Psych: euthymic mood, appropriate affect and demeanor  EKG - atrial fib noted with normal rate Lab Results  Component Value Date   WBC 7.9 11/18/2021   HGB 12.8 11/18/2021   HCT 39.7 11/18/2021   PLT 215 11/18/2021   GLUCOSE 94 11/18/2021   CHOL 156 09/15/2021   TRIG 91 09/15/2021   HDL 53 09/15/2021   LDLCALC 86 09/15/2021   ALT 13 11/18/2021   AST 18 11/18/2021   NA 140 11/18/2021   K 4.4 11/18/2021   CL 94 (L) 11/18/2021   CREATININE 0.48 (L) 11/18/2021   BUN 9 11/18/2021   CO2 31 (H) 11/18/2021   TSH 1.700 09/15/2021      Assessment & Plan:   Problem List Items Addressed This Visit       Cardiovascular and Mediastinum   Paroxysmal atrial fibrillation ([place])   Relevant Orders   Ambulatory referral to Cardiology     Respiratory   COPD without exacerbation ([place]) Continue current meds   Other emphysema ([place]) Continue current meds     Other   Atypical chest pain - Primary   Relevant Orders   EKG 12-Lead   Ambulatory referral to Cardiology  .  No orders of the defined types were placed in this encounter.   Orders Placed This Encounter  Procedures   Ambulatory referral to Cardiology   EKG 12-Lead     Follow-up: Return for as scheduled for chronic follow up.  An After Visit Summary was printed and given to the patient.  Yetta Flock [facility] [phone]

## 2022-03-04 NOTE — Telephone Encounter (Signed)
Will do, Clarise Cruz could you print out a prescription for the Breo so I can send to the patient assistance program with patients application and out of pocket expense report please.   Thank you  Pattricia Boss, [place] Pharmacist Assistant [phone]

## 2022-03-11 DIAGNOSIS — J449 Chronic obstructive pulmonary disease, unspecified: Secondary | ICD-10-CM | POA: Diagnosis not present

## 2022-03-12 ENCOUNTER — Telehealth: Payer: Self-pay

## 2022-03-12 DIAGNOSIS — J449 Chronic obstructive pulmonary disease, unspecified: Secondary | ICD-10-CM | POA: Insufficient documentation

## 2022-03-12 NOTE — Chronic Care Management (AMB) (Signed)
Patient Assistance Coordination  Re-faxed patient assistance application to [employer] with Breo 200-25 mcg prescription and Out of pocket expense report.   Pattricia Boss, [place] Pharmacist Assistant [phone]'

## 2022-03-14 DIAGNOSIS — J449 Chronic obstructive pulmonary disease, unspecified: Secondary | ICD-10-CM | POA: Diagnosis not present

## 2022-03-16 ENCOUNTER — Other Ambulatory Visit: Payer: Self-pay

## 2022-03-16 MED ORDER — FLUTICASONE FUROATE-VILANTEROL 200-25 MCG/ACT IN AEPB
1.0000 | INHALATION_SPRAY | Freq: Every day | RESPIRATORY_TRACT | 3 refills | Status: DC
Start: 2022-03-16 — End: 2022-03-18

## 2022-03-18 ENCOUNTER — Encounter: Payer: Self-pay | Admitting: Cardiology

## 2022-03-18 ENCOUNTER — Other Ambulatory Visit: Payer: Self-pay

## 2022-03-18 ENCOUNTER — Ambulatory Visit: Payer: Medicare Other | Attending: Cardiology | Admitting: Cardiology

## 2022-03-18 VITALS — BP 168/88 | Ht 63.0 in | Wt 137.4 lb

## 2022-03-18 DIAGNOSIS — E875 Hyperkalemia: Secondary | ICD-10-CM | POA: Diagnosis not present

## 2022-03-18 DIAGNOSIS — I10 Essential (primary) hypertension: Secondary | ICD-10-CM

## 2022-03-18 DIAGNOSIS — E782 Mixed hyperlipidemia: Secondary | ICD-10-CM | POA: Diagnosis not present

## 2022-03-18 DIAGNOSIS — J431 Panlobular emphysema: Secondary | ICD-10-CM

## 2022-03-18 DIAGNOSIS — I2510 Atherosclerotic heart disease of native coronary artery without angina pectoris: Secondary | ICD-10-CM | POA: Diagnosis not present

## 2022-03-18 DIAGNOSIS — I4819 Other persistent atrial fibrillation: Secondary | ICD-10-CM

## 2022-03-18 MED ORDER — FLUTICASONE FUROATE-VILANTEROL 200-25 MCG/ACT IN AEPB: 1.0000 | INHALATION_SPRAY | Freq: Every day | RESPIRATORY_TRACT | 3 refills | Status: DC

## 2022-03-18 NOTE — Progress Notes (Signed)
Cardiology Office Note:    Date:  03/18/2022   ID:  Tina Jimenez, DOB 01/10/1941, MRN [id number]  PCP:  Marge Duncans, PA-C  Cardiologist:  Jenean Lindau, MD   Referring MD: Marge Duncans, PA-C    ASSESSMENT:    1. Essential (primary) hypertension   2. Persistent atrial fibrillation ([place])   3. Coronary artery calcification seen on CT scan   4. Mixed hyperlipidemia   5. Panlobular emphysema (HCC)    PLAN:    In order of problems listed above:  Coronary artery calcification: Secondary prevention stressed with patient.  Importance of compliance with diet medication stressed and she vocalized understanding. Essential hypertension: Blood pressure stable and diet was emphasized.  She has an element of whitecoat hypertension.  She checks her blood pressure at home and it is fine. Mixed dyslipidemia: On lipid-lowering medications.  Lipids were reviewed. COPD on supplemental oxygen: She gives history of some shortness of breath.  It is difficult to assess her because of the fact that she is sedentary on wheelchair and supplemental oxygen.  Therefore I will do complete blood work including CBC, hemoglobin TSH and BNP.  She is agreeable. She has atrial fibrillation but not on anticoagulation because of frail status and high fall risk potential.  She understands risk benefit ratio. Patient will be seen in follow-up appointment in 6 months or earlier if the patient has any concerns    Medication Adjustments/Labs and Tests Ordered: Current medicines are reviewed at length with the patient today.  Concerns regarding medicines are outlined above.  No orders of the defined types were placed in this encounter.  No orders of the defined types were placed in this encounter.    No chief complaint on file.    History of Present Illness:    Tina Jimenez is a 81 y.o. female.  Patient has history of coronary artery calcification, essential hypertension, mixed dyslipidemia and COPD on  supplemental oxygen.  She denies any problems at this time and takes care of activities of daily living.  She has persistent/permanent atrial fibrillation.  She is not on anticoagulation because of frail status and fall potential.  At the time of my evaluation, the patient is alert awake oriented and in no distress.  Past Medical History:  Diagnosis Date   Abdominal pain 10/13/2019   Abnormal blood chemistry 08/22/2019   Acute blood loss anemia 06/22/2019   Acute bronchitis with COPD ([place]) 08/15/2019   Acute cystitis with hematuria 10/26/2019   Acute embolism and thrombosis of deep veins of left upper extremity ([place]) 07/08/2019   Acute hypoxemic respiratory failure ([place]) 07/08/2019   Acute pain of left shoulder 10/26/2019   Acute posthemorrhagic anemia 07/08/2019   Acute pulmonary embolism without acute cor pulmonale ([place]) 06/21/2019   Adnexal mass    Anemia 01/08/2020   Arm DVT (deep venous thromboembolism), acute, left (HCC) 06/26/2019   Atrial fibrillation (Tina Jimenez)    Atypical chest pain 03/04/2022   Bladder mass 10/26/2019   Compression fracture of body of thoracic vertebra ([place]) 2020   COPD (chronic obstructive pulmonary disease) (HCC)    COPD without exacerbation ([place]) 08/15/2019   Coronary artery calcification seen on CT scan 05/23/2020   COVID-19 07/08/2019   Dependence on supplemental oxygen 04/14/2020   Effusion, left ankle 08/03/2019   Effusion, left foot 08/03/2019   Effusion, right ankle 08/03/2019   Effusion, right foot 08/03/2019   Elevated white blood cell count, unspecified 07/11/2019   Encounter for other orthopedic aftercare  07/08/2019   Essential (primary) hypertension 07/08/2019   Gastro-esophageal reflux disease without esophagitis 07/08/2019   History of pulmonary embolism 10/02/2019   Hyperlipemia    Hypertension 08/15/2019   Localized edema 08/22/2019   Mixed dyslipidemia 05/23/2020   Muscle weakness (generalized) 07/08/2019   Osteoporosis    Other  disorders of electrolyte and fluid balance, not elsewhere classified 08/10/2019   Other emphysema ([place]) 03/04/2022   Other fatigue 01/08/2020   Oxygen deficiency    Paroxysmal atrial fibrillation (Tina Jimenez) 06/20/2019   Persistent atrial fibrillation (Tina Jimenez) 08/22/2019   Pneumonia 05/2019   COVID Pneumonia   Pneumonia due to COVID-19 virus 07/08/2019   Precordial pain 06/20/2019   Pulmonary embolism (Tina Jimenez) 05/2019   Spinal stenosis, lumbar region without neurogenic claudication 06/14/2019   Unsteadiness on feet 07/08/2019   Vitamin D deficiency 06/22/2019   Wedge compression fracture of unspecified lumbar vertebra, subsequent encounter for fracture with routine healing 07/08/2019    Past Surgical History:  Procedure Laterality Date   APPENDECTOMY     KYPHOPLASTY      Current Medications: Current Meds  Medication Sig   Ascorbic Acid (VITAMIN C) 500 MG CAPS Take 1 tablet by mouth daily.   aspirin EC 81 MG tablet Take 81 mg by mouth every other day. Swallow whole.   aspirin-acetaminophen-caffeine (EXCEDRIN MIGRAINE) 250-250-65 MG tablet Take 1-2 tablets by mouth every 6 (six) hours as needed for headache.   Bempedoic Acid-Ezetimibe (NEXLIZET) 180-10 MG TABS Take 1 tablet by mouth daily in the afternoon.   carvedilol (COREG) 6.25 MG tablet TAKE 1 TABLET BY MOUTH TWICE A DAY   Co-Enzyme Q-10 100 MG CAPS Take 100 mg by mouth daily.   digoxin (LANOXIN) 0.125 MG tablet Take 1 tablet (125 mcg total) by mouth daily.   DOCUSATE SODIUM PO Take 200 mg by mouth daily.   famotidine (PEPCID) 20 MG tablet Take 20 mg by mouth daily.   ferrous sulfate 325 (65 FE) MG tablet TAKE 1 TABLET BY MOUTH EVERY DAY WITH BREAKFAST   fluticasone (FLONASE) 50 MCG/ACT nasal spray SPRAY 2 SPRAYS INTO EACH NOSTRIL EVERY DAY   fluticasone furoate-vilanterol (BREO ELLIPTA) 200-25 MCG/ACT AEPB Inhale 1 puff into the lungs daily.   LACTOBACILLUS PO Take 1 tablet by mouth daily.   loratadine (CLARITIN) 10 MG tablet TAKE 1  TABLET BY MOUTH ONCE DAILY FOR ALLERGIES     Allergies:   Multaq [dronedarone], Klonopin [clonazepam], Lipitor [atorvastatin], and Simvastatin   Social History   Socioeconomic History   Marital status: Widowed    Spouse name: Not on file   Number of children: 4   Years of education: Not on file   Highest education level: Not on file  Occupational History   Occupation: retired  Tobacco Use   Smoking status: Former    Types: Cigarettes    Quit date: 11/01/2018    Years since quitting: 3.3   Smokeless tobacco: Never  Vaping Use   Vaping Use: Never used  Substance and Sexual Activity   Alcohol use: Never   Drug use: Never   Sexual activity: Not on file  Other Topics Concern   Not on file  Social History Narrative   Not on file   Social Determinants of Health   Financial Resource Strain: [place] (02/19/2022)   Overall Financial Resource Strain (CARDIA)    Difficulty of Paying Living Expenses: Somewhat hard  Food Insecurity: No Food Insecurity (02/19/2022)   Hunger Vital Sign    Worried About Running Out  of Food in the Last Year: Never true    [place] in the Last Year: Never true  Transportation Needs: No Transportation Needs (02/19/2022)   PRAPARE - Hydrologist (Medical): No    Lack of Transportation (Non-Medical): No  Physical Activity: Inactive (02/19/2022)   Exercise Vital Sign    Days of Exercise per Week: 0 days    Minutes of Exercise per Session: 0 min  Stress: No Stress Concern Present (02/19/2022)   [place]    Feeling of Stress : Only a little  Social Connections: Socially Isolated (02/19/2022)   Social Connection and Isolation Panel [NHANES]    Frequency of Communication with Friends and Family: Three times a week    Frequency of Social Gatherings with Friends and Family: Three times a week    Attends Religious Services: Never    Active Member of Clubs or  Organizations: No    Attends Archivist Meetings: Never    Marital Status: Widowed     Family History: The patient's family history includes Breast cancer in her sister; Lung cancer in her daughter and sister.  ROS:   Please see the history of present illness.    All other systems reviewed and are negative.  EKGs/Labs/Other Studies Reviewed:    The following studies were reviewed today: I discussed my findings with the patient.  Hospital records from June were reviewed.   Recent Labs: 09/15/2021: TSH 1.700 11/18/2021: ALT 13; BUN 9; Creatinine, Ser 0.48; Hemoglobin 12.8; NT-Pro BNP 2,030; Platelets 215; Potassium 4.4; Sodium 140  Recent Lipid Panel    Component Value Date/Time   CHOL 156 09/15/2021 1048   TRIG 91 09/15/2021 1048   HDL 53 09/15/2021 1048   CHOLHDL 2.9 09/15/2021 1048   LDLCALC 86 09/15/2021 1048    Physical Exam:    VS:  BP (!) 168/88   Ht '5\' 3"'$  (1.6 m)   Wt 137 lb 6.4 oz (62.3 kg)   SpO2 96%   BMI 24.34 kg/m     Wt Readings from Last 3 Encounters:  03/18/22 137 lb 6.4 oz (62.3 kg)  03/03/22 138 lb (62.6 kg)  02/19/22 136 lb (61.7 kg)     GEN: Patient is in no acute distress HEENT: Normal NECK: No JVD; No carotid bruits LYMPHATICS: No lymphadenopathy CARDIAC: Hear sounds regular, 2/6 systolic murmur at the apex. RESPIRATORY:  Clear to auscultation without rales, wheezing or rhonchi  ABDOMEN: Soft, non-tender, non-distended MUSCULOSKELETAL:  No edema; No deformity  SKIN: Warm and dry NEUROLOGIC:  Alert and oriented x 3 PSYCHIATRIC:  Normal affect   Signed, Jenean Lindau, MD  03/18/2022 3:40 PM    [facility]

## 2022-03-18 NOTE — Patient Instructions (Signed)
Medication Instructions:  Your physician recommends that you continue on your current medications as directed. Please refer to the Current Medication list given to you today.  *If you need a refill on your cardiac medications before your next appointment, please call your pharmacy*   Lab Work: Your physician recommends that you return for lab work in:   Labs today: BMP, CBC, TSH, LFT,Pro BNP, Lipid  If you have labs (blood work) drawn today and your tests are completely normal, you will receive your results only by: MyChart Message (if you have MyChart) OR A paper copy in the mail If you have any lab test that is abnormal or we need to change your treatment, we will call you to review the results.   Testing/Procedures: None   Follow-Up: At [facility], you and your health needs are our priority.  As part of our continuing mission to provide you with exceptional heart care, we have created designated Provider Care Teams.  These Care Teams include your primary Cardiologist (physician) and Advanced Practice Providers (APPs -  Physician Assistants and Nurse Practitioners) who all work together to provide you with the care you need, when you need it.  We recommend signing up for the patient portal called "MyChart".  Sign up information is provided on this After Visit Summary.  MyChart is used to connect with patients for Virtual Visits (Telemedicine).  Patients are able to view lab/test results, encounter notes, upcoming appointments, etc.  Non-urgent messages can be sent to your provider as well.   To learn more about what you can do with MyChart, go to [email or web address].    Your next appointment:   6 month(s)  The format for your next appointment:   In Person  Provider:   Jyl Heinz, MD    Other Instructions None  Important Information About Sugar

## 2022-03-19 ENCOUNTER — Other Ambulatory Visit: Payer: Self-pay | Admitting: Physician Assistant

## 2022-03-19 LAB — CBC
Hematocrit: 41.1 % (ref 34.0–46.6)
Hemoglobin: 13.1 g/dL (ref 11.1–15.9)
MCH: 29.6 pg (ref 26.6–33.0)
MCHC: 31.9 g/dL (ref 31.5–35.7)
MCV: 93 fL (ref 79–97)
Platelets: 178 10*3/uL (ref 150–450)
RBC: 4.43 x10E6/uL (ref 3.77–5.28)
RDW: 11.7 % (ref 11.7–15.4)
WBC: 10.4 10*3/uL (ref 3.4–10.8)

## 2022-03-19 LAB — PRO B NATRIURETIC PEPTIDE: NT-Pro BNP: 2006 pg/mL — ABNORMAL HIGH (ref 0–738)

## 2022-03-19 LAB — HEPATIC FUNCTION PANEL
ALT: 13 IU/L (ref 0–32)
AST: 20 IU/L (ref 0–40)
Albumin: 4.3 g/dL (ref 3.8–4.8)
Alkaline Phosphatase: 84 IU/L (ref 44–121)
Bilirubin Total: 0.4 mg/dL (ref 0.0–1.2)
Bilirubin, Direct: 0.22 mg/dL (ref 0.00–0.40)
Total Protein: 6.6 g/dL (ref 6.0–8.5)

## 2022-03-19 LAB — LIPID PANEL
Chol/HDL Ratio: 2.9 ratio (ref 0.0–4.4)
Cholesterol, Total: 147 mg/dL (ref 100–199)
HDL: 50 mg/dL (ref >39)
LDL Chol Calc (NIH): 79 mg/dL (ref 0–99)
Triglycerides: 96 mg/dL (ref 0–149)
VLDL Cholesterol Cal: 18 mg/dL (ref 5–40)

## 2022-03-19 LAB — BASIC METABOLIC PANEL
BUN/Creatinine Ratio: 19 (ref 12–28)
BUN: 9 mg/dL (ref 8–27)
CO2: 33 mmol/L — ABNORMAL HIGH (ref 20–29)
Calcium: 9.7 mg/dL (ref 8.7–10.3)
Chloride: 92 mmol/L — ABNORMAL LOW (ref 96–106)
Creatinine, Ser: 0.48 mg/dL — ABNORMAL LOW (ref 0.57–1.00)
Glucose: 97 mg/dL (ref 70–99)
Potassium: 5.3 mmol/L — ABNORMAL HIGH (ref 3.5–5.2)
Sodium: 139 mmol/L (ref 134–144)
eGFR: 96 mL/min/{1.73_m2} (ref >59)

## 2022-03-19 LAB — TSH: TSH: 1.96 u[IU]/mL (ref 0.450–4.500)

## 2022-03-20 NOTE — Addendum Note (Signed)
Addended by: Truddie Hidden on: 03/20/2022 08:44 AM   Modules accepted: Orders

## 2022-03-23 ENCOUNTER — Encounter: Payer: Self-pay | Admitting: Physician Assistant

## 2022-03-23 ENCOUNTER — Ambulatory Visit (INDEPENDENT_AMBULATORY_CARE_PROVIDER_SITE_OTHER): Payer: Medicare Other | Admitting: Physician Assistant

## 2022-03-23 VITALS — BP 112/70 | HR 69 | Temp 96.3°F | Ht 63.0 in | Wt 137.0 lb

## 2022-03-23 DIAGNOSIS — Z23 Encounter for immunization: Secondary | ICD-10-CM

## 2022-03-23 DIAGNOSIS — E559 Vitamin D deficiency, unspecified: Secondary | ICD-10-CM

## 2022-03-23 DIAGNOSIS — I10 Essential (primary) hypertension: Secondary | ICD-10-CM | POA: Diagnosis not present

## 2022-03-23 DIAGNOSIS — B379 Candidiasis, unspecified: Secondary | ICD-10-CM | POA: Diagnosis not present

## 2022-03-23 MED ORDER — FLUCONAZOLE 150 MG PO TABS: ORAL_TABLET | ORAL | 0 refills | Status: DC

## 2022-03-23 NOTE — Progress Notes (Signed)
Subjective:  Patient ID: Tina Jimenez , female    DOB: [birth year]  Age: 81 y.o. MRN: [id number]  Chief Complaint  Patient presents with   Hypertension   COPD     Tina Jimenez is a 81 year old Caucasian female here for f/u of hypertension, a-fib, and COPD.   HYPERTENSION Pt presents for follow up of hypertension.The patient is tolerating the medication well without side effects. Compliance with treatment has been good; including taking medication as directed , and following up as directed. Currently taking coreg 6.'25mg'$  bid   ATRIAL-FIBRILLATION Diagnosed a-fib in Dec 2020 current treatment Digoxin 0.'125mg'$  daily. She is followed by cardiologist Dr Geraldo Pitter and had appt last week.  She is not currently taking anticoagulants due to unsteady gait and previous back surgery. She does take ASA '81mg'$  qd -  Dr. Geraldo Pitter states in his note that she will consider resuming anticoagulation therapy in the future.    COPD with O2 Dependence The patient has a history of COPD for over 5 years she quit smoking cigarettes of 2020.  She is O2 dependent on 3 L of oxygen continuously today she has been brought into the clinic in a wheelchair assisted by her adult son she denies current shortness of breath or chest pain vital signs were stable.  Oxygen saturation is 99%.  Her COPD is well controlled with Breo Ellipta and DuoNeb nebulizer - She does not want COVID booster at this time but agrees to get flu shot today - Recommend for pt to follow with pulmonologist but she refuses   Lipid/Cholesterol, Follow-up Pt has been started on Nexlizet for lipid management - voices no concerns or problems - just had lipid panel done which was normal  Pt states that she feels she has vaginal yeast infection - would like rx to see if helpful for symptoms  Pt would like flu shot today  The 10-year ASCVD risk score (Arnett DK, et al., 2019) is: 24.8%      Current Outpatient Medications on File Prior to Visit   Medication Sig Dispense Refill   Ascorbic Acid (VITAMIN C) 500 MG CAPS Take 1 tablet by mouth daily.     aspirin EC 81 MG tablet Take 81 mg by mouth every other day. Swallow whole.     aspirin-acetaminophen-caffeine (EXCEDRIN MIGRAINE) 250-250-65 MG tablet Take 1-2 tablets by mouth every 6 (six) hours as needed for headache.     Bempedoic Acid-Ezetimibe (NEXLIZET) 180-10 MG TABS Take 1 tablet by mouth daily in the afternoon. 90 tablet 1   carvedilol (COREG) 6.25 MG tablet TAKE 1 TABLET BY MOUTH TWICE A DAY 180 tablet 0   Co-Enzyme Q-10 100 MG CAPS Take 100 mg by mouth daily.     digoxin (LANOXIN) 0.125 MG tablet Take 1 tablet (125 mcg total) by mouth daily. 90 tablet 3   DOCUSATE SODIUM PO Take 200 mg by mouth daily.     famotidine (PEPCID) 20 MG tablet Take 20 mg by mouth daily.     ferrous sulfate 325 (65 FE) MG tablet TAKE 1 TABLET BY MOUTH EVERY DAY WITH BREAKFAST 90 tablet 1   fluticasone (FLONASE) 50 MCG/ACT nasal spray SPRAY 2 SPRAYS INTO EACH NOSTRIL EVERY DAY 48 mL 2   fluticasone furoate-vilanterol (BREO ELLIPTA) 200-25 MCG/ACT AEPB Inhale 1 puff into the lungs daily. 3 each 3   ipratropium-albuterol (DUONEB) 0.5-2.5 (3) MG/3ML SOLN USE 1 NEBULE IN NEBULIZER EVERY MORNING, NOON, EVENING, AND BEDTIME. 3 mL 3   LACTOBACILLUS PO Take 1 tablet  by mouth daily.     loratadine (CLARITIN) 10 MG tablet TAKE 1 TABLET BY MOUTH ONCE DAILY FOR ALLERGIES 90 tablet 1   triamcinolone cream (KENALOG) 0.1 % APPLY TO AFFECTED AREA TWICE A DAY 30 g 0   No current facility-administered medications on file prior to visit.   Past Medical History:  Diagnosis Date   Abdominal pain 10/13/2019   Abnormal blood chemistry 08/22/2019   Acute blood loss anemia 06/22/2019   Acute bronchitis with COPD ([place]) 08/15/2019   Acute cystitis with hematuria 10/26/2019   Acute embolism and thrombosis of deep veins of left upper extremity ([place]) 07/08/2019   Acute hypoxemic respiratory failure ([place]) 07/08/2019   Acute  pain of left shoulder 10/26/2019   Acute posthemorrhagic anemia 07/08/2019   Acute pulmonary embolism without acute cor pulmonale ([place]) 06/21/2019   Adnexal mass    Anemia 01/08/2020   Arm DVT (deep venous thromboembolism), acute, left (HCC) 06/26/2019   Atrial fibrillation ([place])    Atypical chest pain 03/04/2022   Bladder mass 10/26/2019   Compression fracture of body of thoracic vertebra ([place]) 2020   COPD (chronic obstructive pulmonary disease) (HCC)    COPD without exacerbation ([place]) 08/15/2019   Coronary artery calcification seen on CT scan 05/23/2020   COVID-19 07/08/2019   Dependence on supplemental oxygen 04/14/2020   Effusion, left ankle 08/03/2019   Effusion, left foot 08/03/2019   Effusion, right ankle 08/03/2019   Effusion, right foot 08/03/2019   Elevated white blood cell count, unspecified 07/11/2019   Encounter for other orthopedic aftercare 07/08/2019   Essential (primary) hypertension 07/08/2019   Gastro-esophageal reflux disease without esophagitis 07/08/2019   History of pulmonary embolism 10/02/2019   Hyperlipemia    Hypertension 08/15/2019   Localized edema 08/22/2019   Mixed dyslipidemia 05/23/2020   Muscle weakness (generalized) 07/08/2019   Osteoporosis    Other disorders of electrolyte and fluid balance, not elsewhere classified 08/10/2019   Other emphysema ([place]) 03/04/2022   Other fatigue 01/08/2020   Oxygen deficiency    Paroxysmal atrial fibrillation (HCC) 06/20/2019   Persistent atrial fibrillation ([place]) 08/22/2019   Pneumonia 05/2019   COVID Pneumonia   Pneumonia due to COVID-19 virus 07/08/2019   Precordial pain 06/20/2019   Pulmonary embolism ([place]) 05/2019   Spinal stenosis, lumbar region without neurogenic claudication 06/14/2019   Unsteadiness on feet 07/08/2019   Vitamin D deficiency 06/22/2019   [place]edge compression fracture of unspecified lumbar vertebra, subsequent encounter for fracture with routine healing 07/08/2019   Past Surgical  History:  Procedure Laterality Date   APPENDECTOMY     KYPHOPLASTY      Family History  Problem Relation Age of Onset   Breast cancer Sister    Lung cancer Sister    Lung cancer Daughter    Social History   Socioeconomic History   Marital status: Widowed    Spouse name: Not on file   Number of children: 4   Years of education: Not on file   Highest education level: Not on file  Occupational History   Occupation: retired  Tobacco Use   Smoking status: Former    Types: Cigarettes    Quit date: 11/01/2018    Years since quitting: 3.3   Smokeless tobacco: Never  Vaping Use   Vaping Use: Never used  Substance and Sexual Activity   Alcohol use: Never   Drug use: Never   Sexual activity: Not on file  Other Topics Concern   Not on file  Social History Narrative  Not on file   Social Determinants of Health   Financial Resource Strain: Medium Risk (02/19/2022)   Overall Financial Resource Strain (CARDIA)    Difficulty of Paying Living Expenses: Somewhat hard  Food Insecurity: No Food Insecurity (02/19/2022)   Hunger Vital Sign    Worried About Running Out of Food in the Last Year: Never true    Ran Out of Food in the Last Year: Never true  Transportation Needs: No Transportation Needs (02/19/2022)   PRAPARE - Hydrologist (Medical): No    Lack of Transportation (Non-Medical): No  Physical Activity: Inactive (02/19/2022)   Exercise Vital Sign    Days of Exercise per Week: 0 days    Minutes of Exercise per Session: 0 min  Stress: No Stress Concern Present (02/19/2022)   [place]    Feeling of Stress : Only a little  Social Connections: Socially Isolated (02/19/2022)   Social Connection and Isolation Panel [NHANES]    Frequency of Communication with Friends and Family: Three times a week    Frequency of Social Gatherings with Friends and Family: Three times a week    Attends Religious  Services: Never    Active Member of Clubs or Organizations: No    Attends Archivist Meetings: Never    Marital Status: Widowed   CONSTITUTIONAL: Negative for chills, fatigue, fever, unintentional weight gain and unintentional weight loss.  E/N/T: Negative for ear pain, nasal congestion and sore throat.  CARDIOVASCULAR: Negative for chest pain, dizziness, palpitations and pedal edema.  RESPIRATORY: Negative for recent cough and dyspnea.  GASTROINTESTINAL: Negative for abdominal pain, acid reflux symptoms, constipation, diarrhea, nausea and vomiting.  MSK: Negative for arthralgias and myalgias.  INTEGUMENTARY: see HPI NEUROLOGICAL: Negative for dizziness and headaches.  PSYCHIATRIC: Negative for sleep disturbance and to question depression screen.  Negative for depression, negative for anhedonia.      Objective:  PHYSICAL EXAM:   VS: BP 112/70 (BP Location: Left Arm, Patient Position: Sitting, Cuff Size: Large)   Pulse 69   Temp (!) 96.3 F (35.7 C) (Temporal)   Ht '5\' 3"'$  (1.6 m)   Wt 137 lb (62.1 kg)   SpO2 99% Comment: 3L  BMI 24.27 kg/m   GEN: Well nourished, well developed, in no acute distress   Cardiac: RRR; no murmurs, rubs, or gallops,no edema -  Respiratory:  normal respiratory rate and pattern with no distress - normal breath sounds with no rales, rhonchi, wheezes or rubs MS: no deformity or atrophy  Skin: warm and dry, no rash  Neuro:  Alert and Oriented x 3,- CN II-Xii grossly intact Psych: euthymic mood, appropriate affect and demeanor             Lab Results  Component Value Date   WBC 10.4 03/18/2022   HGB 13.1 03/18/2022   HCT 41.1 03/18/2022   PLT 178 03/18/2022   GLUCOSE 97 03/18/2022   CHOL 147 03/18/2022   TRIG 96 03/18/2022   HDL 50 03/18/2022   LDLCALC 79 03/18/2022   ALT 13 03/18/2022   AST 20 03/18/2022   NA 139 03/18/2022   K 5.3 (H) 03/18/2022   CL 92 (L) 03/18/2022   CREATININE 0.48 (L) 03/18/2022   BUN 9 03/18/2022    CO2 33 (H) 03/18/2022   TSH 1.960 03/18/2022      Assessment & Plan:   1. Hypertension Repeat BMP Continue current meds  2. COPD not  affecting current episode of care [facility])  -Continue inhalers/ nebulizer Recommend pulmonology referral - pt refuses 3. Mixed hyperlipidemia  Continue meds 4 - History AFIB Continue meds and follow up with cardiology as directed 5 - NEED for immunization Fluad given   Follow up in 6 months     An After Visit Summary was printed and given to the patient.  [place] [phone]

## 2022-03-24 LAB — BASIC METABOLIC PANEL
BUN/Creatinine Ratio: 20 (ref 12–28)
BUN: 10 mg/dL (ref 8–27)
CO2: 34 mmol/L — ABNORMAL HIGH (ref 20–29)
Calcium: 9.6 mg/dL (ref 8.7–10.3)
Chloride: 91 mmol/L — ABNORMAL LOW (ref 96–106)
Creatinine, Ser: 0.51 mg/dL — ABNORMAL LOW (ref 0.57–1.00)
Glucose: 102 mg/dL — ABNORMAL HIGH (ref 70–99)
Potassium: 4.8 mmol/L (ref 3.5–5.2)
Sodium: 140 mmol/L (ref 134–144)
eGFR: 95 mL/min/{1.73_m2} (ref >59)

## 2022-03-24 LAB — VITAMIN D 25 HYDROXY (VIT D DEFICIENCY, FRACTURES): Vit D, 25-Hydroxy: 35.9 ng/mL (ref 30.0–100.0)

## 2022-03-25 ENCOUNTER — Ambulatory Visit (INDEPENDENT_AMBULATORY_CARE_PROVIDER_SITE_OTHER): Payer: Medicare Other

## 2022-03-25 DIAGNOSIS — E782 Mixed hyperlipidemia: Secondary | ICD-10-CM

## 2022-03-25 DIAGNOSIS — I10 Essential (primary) hypertension: Secondary | ICD-10-CM

## 2022-03-25 DIAGNOSIS — J449 Chronic obstructive pulmonary disease, unspecified: Secondary | ICD-10-CM

## 2022-03-25 NOTE — Progress Notes (Signed)
Chronic Care Management Pharmacy Note  03/25/2022 Name:  Tina Jimenez MRN:  [id number] DOB:  02-13-1941   Update:  Nexlitol assistance expires Feb 2024, will complete paperwork to renew Still waiting for Breo PAP status. Gave patient phone number to call to check on Patient interested in [facility] but wants to wait for Breo PAP until she decides. Also called and spoke with son to explain process  Subjective: Tina Jimenez is an 81 y.o. year old female who is a primary patient of Marge Duncans, [place].  The CCM team was consulted for assistance with disease management and care coordination needs.    Engaged with patient by telephone for follow up visit in response to provider referral for pharmacy case management and/or care coordination services.   Consent to Services:  The patient was given information about Chronic Care Management services, agreed to services, and gave verbal consent prior to initiation of services.  Please see initial visit note for detailed documentation.   Patient Care Team: Marge Duncans, Hershal Coria as PCP - General (Physician Assistant) Lane Hacker, [facility] as Pharmacist (Pharmacist)  Recent office visits:  03-23-22 Marge Duncans, Vitamin D Deficiency f/u visit  02-19-2022 Perlie Mayo, NP. Medicare wellness visit.   01-23-2022 Marge Duncans, PA-C. STOP breo ellipta and Duo nebulizer. COMPLETED macrobid and diflucan. START trelegy ellipta inhale once daily.   Recent consult visits:  None   Hospital visits:  None in previous 6 months  Objective:  Lab Results  Component Value Date   CREATININE 0.51 (L) 03/23/2022   BUN 10 03/23/2022   GFRNONAA 94 07/10/2020   GFRAA 108 07/10/2020   NA 140 03/23/2022   K 4.8 03/23/2022   CALCIUM 9.6 03/23/2022   CO2 34 (H) 03/23/2022    No results found for: "HGBA1C", "FRUCTOSAMINE", "GFR", "MICROALBUR"  Last diabetic Eye exam: No results found for: "HMDIABEYEEXA"  Last diabetic Foot exam: No results found  for: "HMDIABFOOTEX"   Lab Results  Component Value Date   CHOL 147 03/18/2022   HDL 50 03/18/2022   LDLCALC 79 03/18/2022   TRIG 96 03/18/2022   CHOLHDL 2.9 03/18/2022       Latest Ref Rng & Units 03/18/2022    3:55 PM 11/18/2021    4:05 PM 09/15/2021   10:48 AM  Hepatic Function  Total Protein 6.0 - 8.5 g/dL 6.6  6.5  6.6   Albumin 3.8 - 4.8 g/dL 4.3  4.3  4.3   AST 0 - 40 IU/L _0 ALT 0 - 32 IU/L _1 Alk Phosphatase 44 - 121 IU/L 84  68  74   Total Bilirubin 0.0 - 1.2 mg/dL 0.4  0.3  0.3   Bilirubin, Direct 0.00 - 0.40 mg/dL 0.22       Lab Results  Component Value Date/Time   TSH 1.960 03/18/2022 03:55 PM   TSH 1.700 09/15/2021 10:48 AM       Latest Ref Rng & Units 03/18/2022    3:55 PM 11/18/2021    4:05 PM 09/15/2021   10:48 AM  CBC  WBC 3.4 - 10.8 x10E3/uL 10.4  7.9  10.4   Hemoglobin 11.1 - 15.9 g/dL 13.1  12.8  13.9   Hematocrit 34.0 - 46.6 % 41.1  39.7  41.4   Platelets 150 - 450 x10E3/uL 178  215  205     Lab Results  Component Value Date/Time   VD25OH 35.9 03/23/2022 08:42  AM   VD25OH 21.1 (L) 09/15/2021 10:48 AM    Clinical ASCVD: Yes  The 10-year ASCVD risk score (Arnett DK, et al., 2019) is: 24.8%   Values used to calculate the score:     Age: 82 years     Sex: Female     Is Non-Hispanic African American: No     Diabetic: No     Tobacco smoker: No     Systolic Blood Pressure: 720 mmHg     Is BP treated: Yes     HDL Cholesterol: 50 mg/dL     Total Cholesterol: 147 mg/dL       02/19/2022    1:46 PM 09/15/2021    9:52 AM 05/21/2020    2:13 PM  Depression screen PHQ 2/9  Decreased Interest 0 0 0  Down, Depressed, Hopeless 0 0 0  PHQ - 2 Score 0 0 0     Other: (CHADS2VASc if Afib, MMRC or CAT for COPD, ACT, DEXA)  Social History   Tobacco Use  Smoking Status Former   Types: Cigarettes   Quit date: 11/01/2018   Years since quitting: 3.3  Smokeless Tobacco Never   BP Readings from Last 3 Encounters:  03/23/22 112/70   03/18/22 (!) 168/88  03/03/22 (!) 140/88   Pulse Readings from Last 3 Encounters:  03/23/22 69  03/03/22 61  01/23/22 87   Wt Readings from Last 3 Encounters:  03/23/22 137 lb (62.1 kg)  03/18/22 137 lb 6.4 oz (62.3 kg)  03/03/22 138 lb (62.6 kg)    Assessment/Interventions: Review of patient past medical history, allergies, medications, health status, including review of consultants reports, laboratory and other test data, was performed as part of comprehensive evaluation and provision of chronic care management services.   SDOH:  (Social Determinants of Health) assessments and interventions performed: Yes SDOH Interventions    Flowsheet Row Chronic Care Management from 03/25/2022 in [place] [facility] from 01/20/2022 in [place] [facility] from 07/01/2021 in [place] [facility] from 02/19/2021 in [place]  SDOH Interventions      Transportation Interventions Intervention Not Indicated Intervention Not Indicated -- Other (Comment)  [Son takes her to appointments]  Financial Strain Interventions Other (Comment)  [Breo PAP] Other (Comment) Other (Comment)  [PAP] --       Care Plan : [place]  Updates made by Lane Hacker, RPH since 03/25/2022 12:00 AM     Problem: cad, hld, copd   Priority: High  Onset Date: 08/19/2020     Long-Range Goal: Disease Managment   Start Date: 08/19/2020  Expected End Date: 08/19/2021  Recent Progress: On track  Priority: High  Note:    Current Barriers:  Needs PAP renewed yearly  Pharmacist Clinical Goal(s):  Patient will contact provider office for questions/concerns as evidenced notation of same in electronic health record through collaboration with PharmD and provider.   Interventions: 1:1 collaboration with Marge Duncans, PA-C regarding development and update of comprehensive plan of care as evidenced by provider attestation and  co-signature Inter-disciplinary care team collaboration (see longitudinal plan of care) Comprehensive medication review performed; medication list updated in electronic medical record  Cardio (BP goal <140/90) BP Readings from Last 3 Encounters:  07/01/21 130/70  06/02/21 128/80  03/17/21 (!) 142/84  -Uncontrolled -Current treatment: Carvedilol 6.42m Appropriate, Effective, Safe, Accessible Digoxin 0.1264mAppropriate, Effective, Safe, Accessible -Medications previously tried: N/A  -Current home readings: Doesn't take -Current dietary  habits: (Patient didn't specify) -Current exercise habits: N/A due to mobility -Denies hypotensive/hypertensive symptoms -Educated on BP goals and benefits of medications for prevention of heart attack, stroke and kidney damage; -Counseled to monitor BP at home if possible, document, and provide log at future appointments -Counseled on diet and exercise extensively Sept 2022: Has appt with PCP next week, will defer to them since will be able to get BP reading Jan 2023: BP at goal  Hyperlipidemia: (LDL goal < 100) -Controlled The 10-year ASCVD risk score (Arnett DK, et al., 2019) is: 28.6%   Values used to calculate the score:     Age: 41 years     Sex: Female     Is Non-Hispanic African American: No     Diabetic: No     Tobacco smoker: No     Systolic Blood Pressure: 248 mmHg     Is BP treated: Yes     HDL Cholesterol: 45 mg/dL     Total Cholesterol: 141 mg/dL Lab Results  Component Value Date   CHOL 141 03/17/2021   CHOL 151 11/28/2020   CHOL 202 (H) 07/10/2020   Lab Results  Component Value Date   HDL 45 03/17/2021   HDL 52 11/28/2020   HDL 57 07/10/2020   Lab Results  Component Value Date   LDLCALC 77 03/17/2021   LDLCALC 81 11/28/2020   LDLCALC 127 (H) 07/10/2020   Lab Results  Component Value Date   TRIG 105 03/17/2021   TRIG 100 11/28/2020   TRIG 98 07/10/2020   Lab Results  Component Value Date   CHOLHDL 3.1  03/17/2021   CHOLHDL 2.9 11/28/2020   CHOLHDL 3.5 07/10/2020  No results found for: LDLDIRECT -Current treatment: Nexlizet Fatima Sanger Feb 2023) Appropriate, Effective, Safe, Query accessible -Medications previously tried: Statin  -Current dietary patterns: Didn't specify -Current exercise habits: N/A due to mobility -Educated on Cholesterol goals;  -Counseled on diet and exercise extensively Jan 2023: Renew PAP for Feb 2023 August 2023: Renew grant in Feb 2024 October 2023: F/U Feb 2024 to renew  COPD (Goal: control symptoms and prevent exacerbations) -Not ideally controlled    03/25/2022    2:09 PM  CAT Score  Total CAT Score 27  -Current treatment  Breo Appropriate, Query effective, Safe, Query accessible Albuterol TID prn Appropriate, Query effective, Safe, Accessible O2 3L -Medications previously tried: N/A  -Patient reports consistent use of maintenance inhaler -Frequency of rescue inhaler use:   -Jan 2023: 3-4x/day -Counseled on Proper inhaler technique; Jan 2023: Patient using Albuterol 3-4x/day but is necessary due to constant O2 use. Per note from PCP, patient at goal and controlled August 2023: Patient can't afford breo. Was going to conduct CAT score but patient's main priority was Breo cost. Will have concierge do CAT next month October 2023: Still waiting on Breo PAP. Gave patient phone number to call to check on status. Will have CCM team reach out in a few weeks with patient to see status   Patient Goals/Self-Care Activities Patient will:  - take medications as prescribed  Follow Up Plan: Face to Face appointment with care management team member scheduled for: Feb 2024  [place] Constable, [place].D. - [phone]       Follow Up Plan: Face to Face appointment with care management team member scheduled for:  Feb 2024  [place] Constable, [place].D. - [phone]

## 2022-03-25 NOTE — Patient Instructions (Signed)
Visit Information   Goals Addressed   None    Patient Care Plan: CCM Pharmacy Care Plan     Problem Identified: cad, hld, copd   Priority: High  Onset Date: 08/19/2020     Long-Range Goal: Disease Managment   Start Date: 08/19/2020  Expected End Date: 08/19/2021  Recent Progress: On track  Priority: High  Note:    Current Barriers:  Needs PAP renewed yearly  Pharmacist Clinical Goal(s):  Patient will contact provider office for questions/concerns as evidenced notation of same in electronic health record through collaboration with PharmD and provider.   Interventions: 1:1 collaboration with Marge Duncans, PA-C regarding development and update of comprehensive plan of care as evidenced by provider attestation and co-signature Inter-disciplinary care team collaboration (see longitudinal plan of care) Comprehensive medication review performed; medication list updated in electronic medical record  Cardio (BP goal <140/90) BP Readings from Last 3 Encounters:  07/01/21 130/70  06/02/21 128/80  03/17/21 (!) 142/84  -Uncontrolled -Current treatment: Carvedilol 6.'25mg'$  Appropriate, Effective, Safe, Accessible Digoxin 0.'125mg'$  Appropriate, Effective, Safe, Accessible -Medications previously tried: N/A  -Current home readings: Doesn't take -Current dietary habits: (Patient didn't specify) -Current exercise habits: N/A due to mobility -Denies hypotensive/hypertensive symptoms -Educated on BP goals and benefits of medications for prevention of heart attack, stroke and kidney damage; -Counseled to monitor BP at home if possible, document, and provide log at future appointments -Counseled on diet and exercise extensively Sept 2022: Has appt with PCP next week, will defer to them since will be able to get BP reading Jan 2023: BP at goal  Hyperlipidemia: (LDL goal < 100) -Controlled The 10-year ASCVD risk score (Arnett DK, et al., 2019) is: 28.6%   Values used to calculate the score:      Age: 67 years     Sex: Female     Is Non-Hispanic African American: No     Diabetic: No     Tobacco smoker: No     Systolic Blood Pressure: 462 mmHg     Is BP treated: Yes     HDL Cholesterol: 45 mg/dL     Total Cholesterol: 141 mg/dL Lab Results  Component Value Date   CHOL 141 03/17/2021   CHOL 151 11/28/2020   CHOL 202 (H) 07/10/2020   Lab Results  Component Value Date   HDL 45 03/17/2021   HDL 52 11/28/2020   HDL 57 07/10/2020   Lab Results  Component Value Date   LDLCALC 77 03/17/2021   LDLCALC 81 11/28/2020   LDLCALC 127 (H) 07/10/2020   Lab Results  Component Value Date   TRIG 105 03/17/2021   TRIG 100 11/28/2020   TRIG 98 07/10/2020   Lab Results  Component Value Date   CHOLHDL 3.1 03/17/2021   CHOLHDL 2.9 11/28/2020   CHOLHDL 3.5 07/10/2020  No results found for: LDLDIRECT -Current treatment: Nexlizet Fatima Sanger Feb 2023) Appropriate, Effective, Safe, Query accessible -Medications previously tried: Statin  -Current dietary patterns: Didn't specify -Current exercise habits: N/A due to mobility -Educated on Cholesterol goals;  -Counseled on diet and exercise extensively Jan 2023: Renew PAP for Feb 2023 August 2023: Renew grant in Feb 2024 October 2023: F/U Feb 2024 to renew  COPD (Goal: control symptoms and prevent exacerbations) -Not ideally controlled    03/25/2022    2:09 PM  CAT Score  Total CAT Score 27  -Current treatment  Breo Appropriate, Query effective, Safe, Query accessible Albuterol TID prn Appropriate, Query effective, Safe, Accessible O2 3L -Medications previously  tried: N/A  -Patient reports consistent use of maintenance inhaler -Frequency of rescue inhaler use:   -Jan 2023: 3-4x/day -Counseled on Proper inhaler technique; Jan 2023: Patient using Albuterol 3-4x/day but is necessary due to constant O2 use. Per note from PCP, patient at goal and controlled August 2023: Patient can't afford breo. Was going to conduct CAT score but  patient's main priority was Breo cost. Will have concierge do CAT next month October 2023: Still waiting on Breo PAP. Gave patient phone number to call to check on status. Will have CCM team reach out in a few weeks with patient to see status   Patient Goals/Self-Care Activities Patient will:  - take medications as prescribed  Follow Up Plan: Face to Face appointment with care management team member scheduled for: Feb 2024  [place] Constable, [place].D. - [phone]      Ms. Mcnicholas was given information about Chronic Care Management services today including:  CCM service includes personalized support from designated clinical staff supervised by her physician, including individualized plan of care and coordination with other care providers 24/7 contact phone numbers for assistance for urgent and routine care needs. Standard insurance, coinsurance, copays and deductibles apply for chronic care management only during months in which we provide at least 20 minutes of these services. Most insurances cover these services at 100%, however patients may be responsible for any copay, coinsurance and/or deductible if applicable. This service may help you avoid the need for more expensive face-to-face services. Only one practitioner may furnish and bill the service in a calendar month. The patient may stop CCM services at any time (effective at the end of the month) by phone call to the office staff.  Patient agreed to services and verbal consent obtained.   The patient verbalized understanding of instructions, educational materials, and care plan provided today and DECLINED offer to receive copy of patient instructions, educational materials, and care plan.  The pharmacy team will reach out to the patient again over the next 60 days.   Lane Hacker, [place]

## 2022-03-26 ENCOUNTER — Telehealth: Payer: Self-pay

## 2022-03-26 NOTE — Progress Notes (Signed)
2024 pap renewals for Tina Jimenez has been started and uploaded to be mailed to pt. I followed up with pt in regards to her 7902 PAP application. Pt called the [facility] and spoke with someone about her OOP expense report and they stated she just forgot to sign it but they helped the patient and called the drug store to verify her OOP report and gave her the okay that they are processing it and it was approved. Pt stated they will be shipping it out in a few days.   [name], [place] Pharmacist Assistant  [phone]

## 2022-04-10 DIAGNOSIS — J449 Chronic obstructive pulmonary disease, unspecified: Secondary | ICD-10-CM | POA: Diagnosis not present

## 2022-04-14 DIAGNOSIS — J449 Chronic obstructive pulmonary disease, unspecified: Secondary | ICD-10-CM | POA: Diagnosis not present

## 2022-04-14 DIAGNOSIS — E782 Mixed hyperlipidemia: Secondary | ICD-10-CM | POA: Diagnosis not present

## 2022-04-24 ENCOUNTER — Telehealth: Payer: Self-pay

## 2022-04-24 NOTE — Chronic Care Management (AMB) (Signed)
Chronic Care Management Pharmacy Assistant   Name: Tina Jimenez  MRN: [id number] DOB: 03/15/1941  Reason for Encounter: Disease State/ General  Recent office visits:  None  Recent consult visits:  None  Hospital visits:  None in previous 6 months  Medications: Outpatient Encounter Medications as of 04/24/2022  Medication Sig   Ascorbic Acid (VITAMIN C) 500 MG CAPS Take 1 tablet by mouth daily.   aspirin EC 81 MG tablet Take 81 mg by mouth every other day. Swallow whole.   aspirin-acetaminophen-caffeine (EXCEDRIN MIGRAINE) 250-250-65 MG tablet Take 1-2 tablets by mouth every 6 (six) hours as needed for headache.   Bempedoic Acid-Ezetimibe (NEXLIZET) 180-10 MG TABS Take 1 tablet by mouth daily in the afternoon.   carvedilol (COREG) 6.25 MG tablet TAKE 1 TABLET BY MOUTH TWICE A DAY   Co-Enzyme Q-10 100 MG CAPS Take 100 mg by mouth daily.   digoxin (LANOXIN) 0.125 MG tablet Take 1 tablet (125 mcg total) by mouth daily.   DOCUSATE SODIUM PO Take 200 mg by mouth daily.   famotidine (PEPCID) 20 MG tablet Take 20 mg by mouth daily.   ferrous sulfate 325 (65 FE) MG tablet TAKE 1 TABLET BY MOUTH EVERY DAY WITH BREAKFAST   fluconazole (DIFLUCAN) 150 MG tablet 1 po then repeat 3 days later   fluticasone (FLONASE) 50 MCG/ACT nasal spray SPRAY 2 SPRAYS INTO EACH NOSTRIL EVERY DAY   fluticasone furoate-vilanterol (BREO ELLIPTA) 200-25 MCG/ACT AEPB Inhale 1 puff into the lungs daily.   ipratropium-albuterol (DUONEB) 0.5-2.5 (3) MG/3ML SOLN USE 1 NEBULE IN NEBULIZER EVERY MORNING, NOON, EVENING, AND BEDTIME.   LACTOBACILLUS PO Take 1 tablet by mouth daily.   loratadine (CLARITIN) 10 MG tablet TAKE 1 TABLET BY MOUTH ONCE DAILY FOR ALLERGIES   triamcinolone cream (KENALOG) 0.1 % APPLY TO AFFECTED AREA TWICE A DAY   No facility-administered encounter medications on file as of 04/24/2022.  Contacted [facility] for General Review Call   Chart Review:  Have there been any  documented new, changed, or discontinued medications since last visit? No  Has there been any documented recent hospitalizations or ED visits since last visit with Clinical Pharmacist? No Brief Summary (including medication and/or Diagnosis changes): None  Adherence Review:  Does the Clinical Pharmacist Assistant have access to adherence rates? Yes Adherence rates for STAR metric medications (None). Adherence rates for medications indicated for disease state being reviewed (List medication(s)/day supply/ last 2 fill dates). Does the patient have >5 day gap between last estimated fill dates for any of the above medications or other medication gaps? No Reason for medication gaps.   Disease State Questions:  Able to connect with Patient? Yes  Did patient have any problems with their health recently? No  Have you had any admissions or emergency room visits or worsening of your condition(s) since last visit? No  Have you had any visits with new specialists or providers since your last visit? No  Have you had any new health care problem(s) since your last visit? No  Have you run out of any of your medications since you last spoke with clinical pharmacist? No  Are there any medications you are not taking as prescribed? No  Are you having any issues or side effects with your medications? No  Do you have any other health concerns or questions you want to discuss with your Clinical Pharmacist before your next visit? No  Are there any health concerns that you feel we can do  a better job addressing? No  Are you having any problems with any of the following since the last visit: (select all that apply)  None  12. Any falls since last visit? No   13. Any increased or uncontrolled pain since last visit? No  14. Next visit Type: telephone       Visit with: Arizona Constable        Date: 07-29-2021         Time: 1:00  15. Additional Details? No    Care Gaps: Tdap overdue Shingrix  overdue Dexa scan overdue Covid booster overdue  Star Rating Drugs: None  [place] Clinical Pharmacist Assistant [phone]

## 2022-04-28 ENCOUNTER — Other Ambulatory Visit: Payer: Self-pay | Admitting: Physician Assistant

## 2022-04-28 DIAGNOSIS — E782 Mixed hyperlipidemia: Secondary | ICD-10-CM

## 2022-04-28 DIAGNOSIS — J449 Chronic obstructive pulmonary disease, unspecified: Secondary | ICD-10-CM

## 2022-04-28 DIAGNOSIS — I10 Essential (primary) hypertension: Secondary | ICD-10-CM

## 2022-05-06 ENCOUNTER — Other Ambulatory Visit: Payer: Self-pay

## 2022-05-06 ENCOUNTER — Other Ambulatory Visit: Payer: Self-pay | Admitting: Physician Assistant

## 2022-05-06 DIAGNOSIS — K219 Gastro-esophageal reflux disease without esophagitis: Secondary | ICD-10-CM

## 2022-05-11 ENCOUNTER — Other Ambulatory Visit: Payer: Self-pay | Admitting: Physician Assistant

## 2022-05-11 DIAGNOSIS — J449 Chronic obstructive pulmonary disease, unspecified: Secondary | ICD-10-CM | POA: Diagnosis not present

## 2022-05-13 ENCOUNTER — Other Ambulatory Visit: Payer: Self-pay | Admitting: Physician Assistant

## 2022-05-14 DIAGNOSIS — J449 Chronic obstructive pulmonary disease, unspecified: Secondary | ICD-10-CM | POA: Diagnosis not present

## 2022-05-21 ENCOUNTER — Other Ambulatory Visit: Payer: Self-pay | Admitting: Physician Assistant

## 2022-05-21 ENCOUNTER — Telehealth: Payer: Self-pay

## 2022-05-21 ENCOUNTER — Other Ambulatory Visit: Payer: Self-pay | Admitting: Cardiology

## 2022-05-21 DIAGNOSIS — E782 Mixed hyperlipidemia: Secondary | ICD-10-CM

## 2022-05-21 DIAGNOSIS — I2510 Atherosclerotic heart disease of native coronary artery without angina pectoris: Secondary | ICD-10-CM

## 2022-05-21 NOTE — Progress Notes (Signed)
  Chronic Care Management   Note  05/21/2022 Name: Tina Jimenez MRN: [id number] DOB: September 14, 1940  Tina Jimenez is a 81 y.o. year old female who is a primary care patient of Marge Duncans, Hershal Coria. I reached out to [facility] by phone today in response to a referral sent by [name]'s PCP.  Tina Jimenez  agreedto scheduling an appointment with the CCM RN Case Manager   Follow up plan: Patient agreed to scheduled appointment with RN Case Manager on 06/17/2022(date/time).   Noreene Larsson, [place], [place] [address] Direct Dial: [phone] [email or web address]

## 2022-05-24 ENCOUNTER — Other Ambulatory Visit: Payer: Self-pay | Admitting: Physician Assistant

## 2022-05-26 ENCOUNTER — Telehealth: Payer: Self-pay

## 2022-05-26 NOTE — Chronic Care Management (AMB) (Signed)
Chronic Care Management Pharmacy Assistant   Name: GENEIEVE DUELL  MRN: [id number] DOB: 1940/10/06   Reason for Encounter: Disease State/ General  Recent office visits:  None  Recent consult visits:  None  Hospital visits:  None in previous 6 months  Medications: Outpatient Encounter Medications as of 05/26/2022  Medication Sig   Ascorbic Acid (VITAMIN C) 500 MG CAPS Take 1 tablet by mouth daily.   aspirin EC 81 MG tablet Take 81 mg by mouth every other day. Swallow whole.   aspirin-acetaminophen-caffeine (EXCEDRIN MIGRAINE) 250-250-65 MG tablet Take 1-2 tablets by mouth every 6 (six) hours as needed for headache.   carvedilol (COREG) 6.25 MG tablet TAKE 1 TABLET BY MOUTH TWICE A DAY   Co-Enzyme Q-10 100 MG CAPS Take 100 mg by mouth daily.   digoxin (LANOXIN) 0.125 MG tablet Take 1 tablet (125 mcg total) by mouth daily.   DOCUSATE SODIUM PO Take 200 mg by mouth daily.   famotidine (PEPCID) 20 MG tablet Take 20 mg by mouth daily.   ferrous sulfate 325 (65 FE) MG tablet TAKE 1 TABLET BY MOUTH EVERY DAY WITH BREAKFAST   fluconazole (DIFLUCAN) 150 MG tablet 1 po then repeat 3 days later   fluticasone (FLONASE) 50 MCG/ACT nasal spray SPRAY 2 SPRAYS INTO EACH NOSTRIL EVERY DAY   fluticasone furoate-vilanterol (BREO ELLIPTA) 200-25 MCG/ACT AEPB Inhale 1 puff into the lungs daily.   ipratropium-albuterol (DUONEB) 0.5-2.5 (3) MG/3ML SOLN USE 1 NEBULE IN NEBULIZER EVERY MORNING, NOON, EVENING, AND BEDTIME.   LACTOBACILLUS PO Take 1 tablet by mouth daily.   loratadine (CLARITIN) 10 MG tablet TAKE 1 TABLET BY MOUTH ONCE DAILY FOR ALLERGIES   NEXLIZET 180-10 MG TABS TAKE 1 TABLET BY MOUTH DAILY IN THE AFTERNOON   omeprazole (PRILOSEC) 20 MG capsule TAKE 1 CAPSULE BY MOUTH EVERY DAY   triamcinolone cream (KENALOG) 0.1 % APPLY TO AFFECTED AREA TWICE A DAY   No facility-administered encounter medications on file as of 05/26/2022.  Contacted [facility] for General Review  Call   Chart Review:  Have there been any documented new, changed, or discontinued medications since last visit? No  Has there been any documented recent hospitalizations or ED visits since last visit with Clinical Pharmacist? No Brief Summary : None   Adherence Review:  Does the Clinical Pharmacist Assistant have access to adherence rates? Yes Adherence rates for STAR metric medications (None). Adherence rates for medications indicated for disease state being reviewed (List medication(s)/day supply/ last 2 fill dates). Does the patient have >5 day gap between last estimated fill dates for any of the above medications or other medication gaps? No Reason for medication gaps. Do you receive your medications through PAP? Yes patient stated she has supply and was told to call for next shipment on 05-28-2022.  Disease State Questions:  Able to connect with Patient? Yes  Did patient have any problems with their health recently? No  Have you had any admissions or emergency room visits or worsening of your condition(s) since last visit? No  Have you had any visits with new specialists or providers since your last visit? No  Have you had any new health care problem(s) since your last visit? No  Have you run out of any of your medications since you last spoke with clinical pharmacist? No  Are there any medications you are not taking as prescribed? No  Are you having any issues or side effects with your medications? No  Do you  have any other health concerns or questions you want to discuss with your Clinical Pharmacist before your next visit? No  Are there any health concerns that you feel we can do a better job addressing? No  Are you having any problems with any of the following since the last visit: (select all that apply)  None  12. Any falls since last visit? No  13. Any increased or uncontrolled pain since last visit? No   14. Next visit Type: telephone       Visit with:  Ari[place] Constable        Date: 07-29-2021        Time: 1:00  15. Additional Details? No    Care Gaps: Tdap overdue Shingrix overdue Dexa scan overdue Covid booster overdue  Star Rating Drugs: None  [place] Clinical Pharmacist Assistant [phone]

## 2022-05-29 ENCOUNTER — Other Ambulatory Visit: Payer: Self-pay

## 2022-05-29 MED ORDER — FLUTICASONE PROPIONATE 50 MCG/ACT NA SUSP: NASAL | 2 refills | Status: DC

## 2022-06-10 DIAGNOSIS — J449 Chronic obstructive pulmonary disease, unspecified: Secondary | ICD-10-CM | POA: Diagnosis not present

## 2022-06-14 DIAGNOSIS — J449 Chronic obstructive pulmonary disease, unspecified: Secondary | ICD-10-CM | POA: Diagnosis not present

## 2022-06-17 ENCOUNTER — Ambulatory Visit (INDEPENDENT_AMBULATORY_CARE_PROVIDER_SITE_OTHER): Payer: Medicare Other

## 2022-06-17 ENCOUNTER — Telehealth: Payer: Medicare Other

## 2022-06-17 DIAGNOSIS — I10 Essential (primary) hypertension: Secondary | ICD-10-CM

## 2022-06-17 DIAGNOSIS — J449 Chronic obstructive pulmonary disease, unspecified: Secondary | ICD-10-CM

## 2022-06-17 NOTE — Plan of Care (Signed)
Chronic Care Management Provider Comprehensive Care Plan    06/17/2022 Name: Tina Jimenez MRN: [id number] DOB: March 11, 1941  Referral to Chronic Care Management (CCM) services was placed by Provider:  Marge Duncans on Date: 04-28-2022.  Chronic Condition 1: COPD Provider Assessment and Plan The patient has a history of COPD for over 5 years she quit smoking cigarettes of 2020.  She is O2 dependent on 3 L of oxygen continuously today she has been brought into the clinic in a wheelchair assisted by her adult son she denies current shortness of breath or chest pain vital signs were stable.  Oxygen saturation is 99%.  Her COPD is well controlled with Breo Ellipta and DuoNeb nebulizer - She does not want COVID booster at this time but agrees to get flu shot today - Recommend for pt to follow with pulmonologist but she refuses  Continue inhalers/ nebulizer Recommend pulmonology referral - pt refuses   Expected Outcome/Goals Addressed This Visit (Provider CCM goals/Provider Assessment and plan   CCM (COPD) EXPECTED OUTCOME: MONITOR, SELF-MANAGE AND REDUCE SYMPTOMS OF COPD  Symptom Management Condition 1: Take all medications as prescribed Attend all scheduled provider appointments Call provider office for new concerns or questions  call the Suicide and Crisis Lifeline: 988 call the [place] National Suicide Prevention Lifeline: 339-787-1402 or TTY: (782) 017-2669 TTY 202-366-5660) to talk to a trained counselor call 1-800-273-TALK (toll free, 24 hour hotline) if experiencing a Mental Health or [place]  identify and remove indoor air pollutants limit outdoor activity during cold weather listen for public air quality announcements every day do breathing exercises every day develop a rescue plan eliminate symptom triggers at home follow rescue plan if symptoms flare-up  Chronic Condition 2: HTN Provider Assessment and Plan Pt presents for follow up of hypertension.The patient is  tolerating the medication well without side effects. Compliance with treatment has been good; including taking medication as directed , and following up as directed. Currently taking coreg 6.'25mg'$  bid Repeat BMP Continue current meds     Expected Outcome/Goals Addressed This Visit (Provider CCM goals/Provider Assessment and plan   CCM (HYPERTENSION)  EXPECTED OUTCOME:  MONITOR,SELF- MANAGE AND REDUCE SYMPTOMS OF HYPERTENSION  Symptom Management Condition 2: Take all medications as prescribed Attend all scheduled provider appointments Call provider office for new concerns or questions  call the Suicide and Crisis Lifeline: 988 call the [place] National Suicide Prevention Lifeline: (820) 344-8180 or TTY: (361) 558-3724 TTY (417)419-8313) to talk to a trained counselor call 1-800-273-TALK (toll free, 24 hour hotline) if experiencing a Mental Health or [place]  check blood pressure weekly choose a place to take my blood pressure (home, clinic or office, retail store) learn about high blood pressure call doctor for signs and symptoms of high blood pressure keep all doctor appointments take medications for blood pressure exactly as prescribed  Problem List Patient Active Problem List   Diagnosis Date Noted   COPD (chronic obstructive pulmonary disease) ([place]) 03/12/2022   Atypical chest pain 03/04/2022   Other emphysema ([place]) 03/04/2022   Coronary artery calcification seen on CT scan 05/23/2020   Mixed dyslipidemia 05/23/2020   Atrial fibrillation ([place])    Osteoporosis    Oxygen deficiency    Dependence on supplemental oxygen 04/14/2020   Other fatigue 01/08/2020   Anemia 01/08/2020   Acute cystitis with hematuria 10/26/2019   Bladder mass 10/26/2019   Acute pain of left shoulder 10/26/2019   Adnexal mass    Abdominal pain 10/13/2019   History of  pulmonary embolism 10/02/2019   Abnormal blood chemistry 08/22/2019   Persistent atrial fibrillation ([place]) 08/22/2019    Localized edema 08/22/2019   Hyperlipemia 08/15/2019   Acute bronchitis with COPD ([place]) 08/15/2019   COPD without exacerbation ([place]) 08/15/2019   Hypertension 08/15/2019   Other disorders of electrolyte and fluid balance, not elsewhere classified 08/10/2019   Effusion, left ankle 08/03/2019   Effusion, right ankle 08/03/2019   Effusion, left foot 08/03/2019   Effusion, right foot 08/03/2019   Elevated white blood cell count, unspecified 07/11/2019   Acute hypoxemic respiratory failure ([place]) 07/08/2019   Essential (primary) hypertension 07/08/2019   Pneumonia due to COVID-19 virus 07/08/2019   Gastro-esophageal reflux disease without esophagitis 07/08/2019   COVID-19 07/08/2019   Encounter for other orthopedic aftercare 07/08/2019   Muscle weakness (generalized) 07/08/2019   Unsteadiness on feet 07/08/2019   [place]edge compression fracture of unspecified lumbar vertebra, subsequent encounter for fracture with routine healing 07/08/2019   Acute posthemorrhagic anemia 07/08/2019   Acute embolism and thrombosis of deep veins of left upper extremity ([place]) 07/08/2019   Arm DVT (deep venous thromboembolism), acute, left ([place]) 06/26/2019   Acute blood loss anemia 06/22/2019   Vitamin D deficiency 06/22/2019   Acute pulmonary embolism without acute cor pulmonale ([place]) 06/21/2019   Paroxysmal atrial fibrillation ([place]) 06/20/2019   Precordial pain 06/20/2019   Spinal stenosis, lumbar region without neurogenic claudication 06/14/2019   Pneumonia 05/2019   Pulmonary embolism ([place]) 05/2019   Compression fracture of body of thoracic vertebra ([place]) 2020    Medication Management  Current Outpatient Medications:    Ascorbic Acid (VITAMIN C) 500 MG CAPS, Take 1 tablet by mouth daily., Disp: , Rfl:    aspirin EC 81 MG tablet, Take 81 mg by mouth every other day. Swallow whole., Disp: , Rfl:    aspirin-acetaminophen-caffeine (EXCEDRIN MIGRAINE) 250-250-65 MG tablet, Take 1-2 tablets by mouth every 6  (six) hours as needed for headache., Disp: , Rfl:    carvedilol (COREG) 6.25 MG tablet, TAKE 1 TABLET BY MOUTH TWICE A DAY, Disp: 180 tablet, Rfl: 0   Co-Enzyme Q-10 100 MG CAPS, Take 100 mg by mouth daily., Disp: , Rfl:    digoxin (LANOXIN) 0.125 MG tablet, Take 1 tablet (125 mcg total) by mouth daily., Disp: 90 tablet, Rfl: 3   DOCUSATE SODIUM PO, Take 200 mg by mouth daily., Disp: , Rfl:    famotidine (PEPCID) 20 MG tablet, Take 20 mg by mouth daily., Disp: , Rfl:    ferrous sulfate 325 (65 FE) MG tablet, TAKE 1 TABLET BY MOUTH EVERY DAY WITH BREAKFAST, Disp: 90 tablet, Rfl: 1   fluconazole (DIFLUCAN) 150 MG tablet, 1 po then repeat 3 days later, Disp: 2 tablet, Rfl: 0   fluticasone (FLONASE) 50 MCG/ACT nasal spray, SPRAY 2 SPRAYS INTO EACH NOSTRIL EVERY DAY, Disp: 48 mL, Rfl: 2   fluticasone furoate-vilanterol (BREO ELLIPTA) 200-25 MCG/ACT AEPB, Inhale 1 puff into the lungs daily., Disp: 3 each, Rfl: 3   ipratropium-albuterol (DUONEB) 0.5-2.5 (3) MG/3ML SOLN, USE 1 NEBULE IN NEBULIZER EVERY MORNING, NOON, EVENING, AND BEDTIME., Disp: 180 mL, Rfl: 1   LACTOBACILLUS PO, Take 1 tablet by mouth daily., Disp: , Rfl:    loratadine (CLARITIN) 10 MG tablet, TAKE 1 TABLET BY MOUTH ONCE DAILY FOR ALLERGIES, Disp: 90 tablet, Rfl: 1   NEXLIZET 180-10 MG TABS, TAKE 1 TABLET BY MOUTH DAILY IN THE AFTERNOON, Disp: 90 tablet, Rfl: 1   omeprazole (PRILOSEC) 20 MG capsule, TAKE 1 CAPSULE  BY MOUTH EVERY DAY, Disp: 90 capsule, Rfl: 1   triamcinolone cream (KENALOG) 0.1 %, APPLY TO AFFECTED AREA TWICE A DAY, Disp: 30 g, Rfl: 0  Cognitive Assessment Identity Confirmed: : Name; DOB Cognitive Status: Normal   Functional Assessment Hearing Difficulty or Deaf: no Wear Glasses or Blind: yes Vision Management: wears glasses Concentrating, Remembering or Making Decisions Difficulty (CP): no Difficulty Communicating: no Difficulty Eating/Swallowing: no Walking or Climbing Stairs Difficulty: yes Walking or  Climbing Stairs: ambulation difficulty, requires equipment Mobility Management: the patient uses a walker when ambulating and also has a wheelchair. The patient has given up driving Dressing/Bathing Difficulty: yes Dressing/Bathing: bathing difficulty, assistance 1 person Dressing/Bathing Management: the patients granddaughter comes and helps her with bathing for safety Doing Errands Independently Difficulty (such as shopping) (CP): yes Errands Management: she states that her 3 sons and grandchildren take care of all of her needs and run errands for her, she gave up driving and wants to be safe   Caregiver Assessment  Primary Source of Support/Comfort: child(ren) Name of Support/Comfort Primary Source: Sons: Gwyndolyn Saxon and [place] in Home: alone Family Caregiver if Needed: child(ren), adult; grandchild(ren), adult Family Caregiver Names: Gwyndolyn Saxon and Event organiser Primary Roles/Responsibilities: retired Concerns About Impact on Relationships: has good support from her family, is thankful for their support and help daily   Planned Interventions  Evaluation of current treatment plan related to hypertension self management and patient's adherence to plan as established by provider. The patient states that her blood pressures is better. Denies any new changes ;   Provided education to patient re: stroke prevention, s/s of heart attack and stroke; Reviewed prescribed diet heart healthy diet  Reviewed medications with patient and discussed importance of compliance. The patient is compliant with medications and works with the pharm D on a regular basis  Discussed plans with patient for ongoing care management follow up and provided patient with direct contact information for care management team; Advised patient, providing education and rationale, to monitor blood pressure daily and record, calling PCP for findings outside established parameters;  Reviewed scheduled/upcoming provider appointments  including: 10-08-2022 at 0900 am Advised patient to discuss changes in blood pressure or heart health with provider; Provided education on prescribed diet heart healthy diet;  Discussed complications of poorly controlled blood pressure such as heart disease, stroke, circulatory complications, vision complications, kidney impairment, sexual dysfunction;  Screening for signs and symptoms of depression related to chronic disease state;  Assessed social determinant of health barriers;  Provided patient with basic written and verbal COPD education on self care/management/and exacerbation prevention Advised patient to track and manage COPD triggers Provided written and verbal instructions on pursed lip breathing and utilized returned demonstration as teach back Provided instruction about proper use of medications used for management of COPD including inhalers Advised patient to self assesses COPD action plan zone and make appointment with provider if in the yellow zone for 48 hours without improvement Advised patient to engage in light exercise as tolerated 3-5 days a week to aid in the the management of COPD Provided education about and advised patient to utilize infection prevention strategies to reduce risk of respiratory infection Discussed the importance of adequate rest and management of fatigue with COPD Screening for signs and symptoms of depression related to chronic disease state  Assessed social determinant of health barriers   Interaction and coordination with outside resources, practitioners, and providers See CCM Referral  Care Plan: Patient declined

## 2022-06-17 NOTE — Patient Instructions (Signed)
Please call the care guide team at [phone] if you need to cancel or reschedule your appointment.   If you are experiencing a Mental Health or [place] or need someone to talk to, please call the Suicide and Crisis Lifeline: 988 call the [place] National Suicide Prevention Lifeline: 847-674-1553 or TTY: (931) 063-4856 TTY 782 729 8626) to talk to a trained counselor call 1-800-273-TALK (toll free, 24 hour hotline)   Following is a copy of the CCM Program Consent:  CCM service includes personalized support from designated clinical staff supervised by the physician, including individualized plan of care and coordination with other care providers 24/7 contact phone numbers for assistance for urgent and routine care needs. Service will only be billed when office clinical staff spend 20 minutes or more in a month to coordinate care. Only one practitioner may furnish and bill the service in a calendar month. The patient may stop CCM services at amy time (effective at the end of the month) by phone call to the office staff. The patient will be responsible for cost sharing (co-pay) or up to 20% of the service fee (after annual deductible is met)  Following is a copy of your full provider care plan:   Goals Addressed             This Visit's Progress    CCM Expected Outcome:  Monitor, Self-Manage, and Reduce Symptoms of Hypertension       Current Barriers:  Chronic Disease Management support and education needs related to Effective management of HTN BP Readings from Last 3 Encounters:  03/23/22 112/70  03/18/22 (!) 168/88  03/03/22 (!) 140/88     Planned Interventions: Evaluation of current treatment plan related to hypertension self management and patient's adherence to plan as established by provider. The patient states that her blood pressures is better. Denies any new changes ;   Provided education to patient re: stroke prevention, s/s of heart attack and  stroke; Reviewed prescribed diet heart healthy diet  Reviewed medications with patient and discussed importance of compliance. The patient is compliant with medications and works with the pharm D on a regular basis  Discussed plans with patient for ongoing care management follow up and provided patient with direct contact information for care management team; Advised patient, providing education and rationale, to monitor blood pressure daily and record, calling PCP for findings outside established parameters;  Reviewed scheduled/upcoming provider appointments including: 10-08-2022 at 0900 am Advised patient to discuss changes in blood pressure or heart health with provider; Provided education on prescribed diet heart healthy diet;  Discussed complications of poorly controlled blood pressure such as heart disease, stroke, circulatory complications, vision complications, kidney impairment, sexual dysfunction;  Screening for signs and symptoms of depression related to chronic disease state;  Assessed social determinant of health barriers;   Symptom Management: Take medications as prescribed   Attend all scheduled provider appointments Call provider office for new concerns or questions  call the Suicide and Crisis Lifeline: 988 call the [place] National Suicide Prevention Lifeline: (947)057-6863 or TTY: 307-870-1921 TTY 671-203-6630) to talk to a trained counselor call 1-800-273-TALK (toll free, 24 hour hotline) if experiencing a Mental Health or [place]  check blood pressure weekly learn about high blood pressure call doctor for signs and symptoms of high blood pressure develop an action plan for high blood pressure keep all doctor appointments take medications for blood pressure exactly as prescribed report new symptoms to your doctor  Follow Up Plan: Telephone follow up appointment  with care management team member scheduled for: 08-19-2022 at 145 pm       CCM:  Maintain,  Monitor and Self-Manage Symptoms of COPD       Current Barriers:  Care Coordination needs related to cost constraints to inhalers, currently working with the pharm D in a patient with COPD Chronic Disease Management support and education needs related to effective management of COPD  Planned Interventions: Provided patient with basic written and verbal COPD education on self care/management/and exacerbation prevention Advised patient to track and manage COPD triggers Provided written and verbal instructions on pursed lip breathing and utilized returned demonstration as teach back Provided instruction about proper use of medications used for management of COPD including inhalers Advised patient to self assesses COPD action plan zone and make appointment with provider if in the yellow zone for 48 hours without improvement Advised patient to engage in light exercise as tolerated 3-5 days a week to aid in the the management of COPD Provided education about and advised patient to utilize infection prevention strategies to reduce risk of respiratory infection Discussed the importance of adequate rest and management of fatigue with COPD Screening for signs and symptoms of depression related to chronic disease state  Assessed social determinant of health barriers  Symptom Management: Take medications as prescribed   Attend all scheduled provider appointments Call provider office for new concerns or questions  call the Suicide and Crisis Lifeline: 988 call the [place] National Suicide Prevention Lifeline: 989-162-7182 or TTY: (430)139-5631 TTY 302 220 0230) to talk to a trained counselor call 1-800-273-TALK (toll free, 24 hour hotline) if experiencing a Mental Health or [place]  identify and remove indoor air pollutants limit outdoor activity during cold weather listen for public air quality announcements every day do breathing exercises every day develop a rescue plan eliminate  symptom triggers at home follow rescue plan if symptoms flare-up  Follow Up Plan: Telephone follow up appointment with care management team member scheduled for: 08-19-2022 at 145 pm          The patient verbalized understanding of instructions, educational materials, and care plan provided today and DECLINED offer to receive copy of patient instructions, educational materials, and care plan.   Telephone follow up appointment with care management team member scheduled for: 08-19-2022 at 145 pm

## 2022-06-17 NOTE — Chronic Care Management (AMB) (Signed)
Chronic Care Management   CCM RN Visit Note  06/17/2022 Name: Tina Jimenez MRN: [id number] DOB: September 15, 1940  Subjective: Tina Jimenez is a 82 y.o. year old female who is a primary care patient of Marge Duncans, Hershal Coria. The patient was referred to the Chronic Care Management team for assistance with care management needs subsequent to provider initiation of CCM services and plan of care.    Today's Visit:  Engaged with patient by telephone for initial visit.     SDOH Interventions Today    Flowsheet Row Most Recent Value  SDOH Interventions   Food Insecurity Interventions Intervention Not Indicated  Housing Interventions Intervention Not Indicated  Transportation Interventions Intervention Not Indicated  Utilities Interventions Intervention Not Indicated  Alcohol Usage Interventions Intervention Not Indicated (Score <7)  Financial Strain Interventions Other (Comment)  [Breo Pap and works with pharm D]  Physical Activity Interventions Other (Comments)  [limited in her ability to do exercise due to breathing and her mobility]  Stress Interventions Intervention Not Indicated  Social Connections Interventions Intervention Not Indicated, Other (Comment)  [has good support system from her sons and grandchildren]         Goals Addressed             This Visit's Progress    CCM Expected Outcome:  Monitor, Self-Manage, and Reduce Symptoms of Hypertension       Current Barriers:  Chronic Disease Management support and education needs related to Effective management of HTN BP Readings from Last 3 Encounters:  03/23/22 112/70  03/18/22 (!) 168/88  03/03/22 (!) 140/88     Planned Interventions: Evaluation of current treatment plan related to hypertension self management and patient's adherence to plan as established by provider. The patient states that her blood pressures is better. Denies any new changes ;   Provided education to patient re: stroke prevention, s/s of heart  attack and stroke; Reviewed prescribed diet heart healthy diet  Reviewed medications with patient and discussed importance of compliance. The patient is compliant with medications and works with the pharm D on a regular basis  Discussed plans with patient for ongoing care management follow up and provided patient with direct contact information for care management team; Advised patient, providing education and rationale, to monitor blood pressure daily and record, calling PCP for findings outside established parameters;  Reviewed scheduled/upcoming provider appointments including: 10-08-2022 at 0900 am Advised patient to discuss changes in blood pressure or heart health with provider; Provided education on prescribed diet heart healthy diet;  Discussed complications of poorly controlled blood pressure such as heart disease, stroke, circulatory complications, vision complications, kidney impairment, sexual dysfunction;  Screening for signs and symptoms of depression related to chronic disease state;  Assessed social determinant of health barriers;   Symptom Management: Take medications as prescribed   Attend all scheduled provider appointments Call provider office for new concerns or questions  call the Suicide and Crisis Lifeline: 988 call the [place] National Suicide Prevention Lifeline: (734) 381-3696 or TTY: (586)656-2988 TTY (858)319-4716) to talk to a trained counselor call 1-800-273-TALK (toll free, 24 hour hotline) if experiencing a Mental Health or [place]  check blood pressure weekly learn about high blood pressure call doctor for signs and symptoms of high blood pressure develop an action plan for high blood pressure keep all doctor appointments take medications for blood pressure exactly as prescribed report new symptoms to your doctor  Follow Up Plan: Telephone follow up appointment with care management team member scheduled for:  08-19-2022 at 145 pm       CCM:   Maintain, Monitor and Self-Manage Symptoms of COPD       Current Barriers:  Care Coordination needs related to cost constraints to inhalers, currently working with the pharm D in a patient with COPD Chronic Disease Management support and education needs related to effective management of COPD  Planned Interventions: Provided patient with basic written and verbal COPD education on self care/management/and exacerbation prevention Advised patient to track and manage COPD triggers Provided written and verbal instructions on pursed lip breathing and utilized returned demonstration as teach back Provided instruction about proper use of medications used for management of COPD including inhalers Advised patient to self assesses COPD action plan zone and make appointment with provider if in the yellow zone for 48 hours without improvement Advised patient to engage in light exercise as tolerated 3-5 days a week to aid in the the management of COPD Provided education about and advised patient to utilize infection prevention strategies to reduce risk of respiratory infection Discussed the importance of adequate rest and management of fatigue with COPD Screening for signs and symptoms of depression related to chronic disease state  Assessed social determinant of health barriers  Symptom Management: Take medications as prescribed   Attend all scheduled provider appointments Call provider office for new concerns or questions  call the Suicide and Crisis Lifeline: 988 call the [place] National Suicide Prevention Lifeline: 480-775-1163 or TTY: (205) 175-9424 TTY 775-558-0898) to talk to a trained counselor call 1-800-273-TALK (toll free, 24 hour hotline) if experiencing a Mental Health or [place]  identify and remove indoor air pollutants limit outdoor activity during cold weather listen for public air quality announcements every day do breathing exercises every day develop a rescue  plan eliminate symptom triggers at home follow rescue plan if symptoms flare-up  Follow Up Plan: Telephone follow up appointment with care management team member scheduled for: 08-19-2022 at 145 pm          Plan:Telephone follow up appointment with care management team member scheduled for:  08-19-2022 at 145 pm  Travis, MSN, CCM RN Care Manager  Chronic Care Management Direct Number: [phone]

## 2022-06-26 ENCOUNTER — Other Ambulatory Visit: Payer: Self-pay | Admitting: Physician Assistant

## 2022-07-02 ENCOUNTER — Telehealth: Payer: Self-pay

## 2022-07-02 NOTE — Progress Notes (Signed)
Care Management & Coordination Services Pharmacy Team  Reason for Encounter: General adherence update   Contacted patient on 07/02/2022 for general disease state and medication adherence call.   Recent office visits:  06-17-2022  Vanita Ingles, RN (CCM)  Recent consult visits:  None  Hospital visits:  None in previous 6 months  Medications: Outpatient Encounter Medications as of 07/02/2022  Medication Sig   Ascorbic Acid (VITAMIN C) 500 MG CAPS Take 1 tablet by mouth daily.   aspirin EC 81 MG tablet Take 81 mg by mouth every other day. Swallow whole.   aspirin-acetaminophen-caffeine (EXCEDRIN MIGRAINE) 250-250-65 MG tablet Take 1-2 tablets by mouth every 6 (six) hours as needed for headache.   carvedilol (COREG) 6.25 MG tablet TAKE 1 TABLET BY MOUTH TWICE A DAY   Co-Enzyme Q-10 100 MG CAPS Take 100 mg by mouth daily.   digoxin (LANOXIN) 0.125 MG tablet Take 1 tablet (125 mcg total) by mouth daily.   DOCUSATE SODIUM PO Take 200 mg by mouth daily.   famotidine (PEPCID) 20 MG tablet Take 20 mg by mouth daily.   ferrous sulfate 325 (65 FE) MG tablet TAKE 1 TABLET BY MOUTH EVERY DAY WITH BREAKFAST   fluconazole (DIFLUCAN) 150 MG tablet 1 po then repeat 3 days later   fluticasone (FLONASE) 50 MCG/ACT nasal spray SPRAY 2 SPRAYS INTO EACH NOSTRIL EVERY DAY   fluticasone furoate-vilanterol (BREO ELLIPTA) 200-25 MCG/ACT AEPB Inhale 1 puff into the lungs daily.   ipratropium-albuterol (DUONEB) 0.5-2.5 (3) MG/3ML SOLN USE 1 NEBULE IN NEBULIZER EVERY MORNING, NOON, EVENING, AND BEDTIME.   LACTOBACILLUS PO Take 1 tablet by mouth daily.   loratadine (CLARITIN) 10 MG tablet TAKE 1 TABLET BY MOUTH ONCE DAILY FOR ALLERGIES   NEXLIZET 180-10 MG TABS TAKE 1 TABLET BY MOUTH DAILY IN THE AFTERNOON   omeprazole (PRILOSEC) 20 MG capsule TAKE 1 CAPSULE BY MOUTH EVERY DAY   triamcinolone cream (KENALOG) 0.1 % APPLY TO AFFECTED AREA TWICE A DAY   No facility-administered encounter medications on file as of  07/02/2022.    Recent vitals BP Readings from Last 3 Encounters:  03/23/22 112/70  03/18/22 (!) 168/88  03/03/22 (!) 140/88   Pulse Readings from Last 3 Encounters:  03/23/22 69  03/03/22 61  01/23/22 87   Wt Readings from Last 3 Encounters:  03/23/22 137 lb (62.1 kg)  03/18/22 137 lb 6.4 oz (62.3 kg)  03/03/22 138 lb (62.6 kg)   BMI Readings from Last 3 Encounters:  03/23/22 24.27 kg/m  03/18/22 24.34 kg/m  03/03/22 26.07 kg/m    Recent lab results    Component Value Date/Time   NA 140 03/23/2022 0842   K 4.8 03/23/2022 0842   CL 91 (L) 03/23/2022 0842   CO2 34 (H) 03/23/2022 0842   GLUCOSE 102 (H) 03/23/2022 0842   GLUCOSE 104 (H) 10/14/2019 0343   BUN 10 03/23/2022 0842   CREATININE 0.51 (L) 03/23/2022 0842   CALCIUM 9.6 03/23/2022 0842    Lab Results  Component Value Date   CREATININE 0.51 (L) 03/23/2022   EGFR 95 03/23/2022   GFRNONAA 94 07/10/2020   GFRAA 108 07/10/2020   No results found for: "HGBA1C", "FRUCTOSAMINE", "MICROALBUR"  Lab Results  Component Value Date   CHOL 147 03/18/2022   HDL 50 03/18/2022   LDLCALC 79 03/18/2022   TRIG 96 03/18/2022   CHOLHDL 2.9 03/18/2022     What concerns do you have about your medications? Patient stated no  The patient denies  side effects with their medications.   How often do you forget or accidentally miss a dose? Never  Do you use a pillbox? Yes  Are you having any problems getting your medications from your pharmacy? No  Has the cost of your medications been a concern? No  Since last visit with PharmD, no interventions have been made.   The patient has not had an ED visit since last contact.   The patient denies problems with their health.   Patient denies concerns or questions for [place] Constable, PharmD at this time.   Counseled patient on: Great job taking medications   Care Gaps: Annual wellness visit in last year? Yes  Tdap overdue Shingrix overdue Dexa scan overdue Covid  booster overdue  Star Rating Drugs:  None  [facility] CMA Clinical Pharmacist Assistant [phone]  None

## 2022-07-11 DIAGNOSIS — J449 Chronic obstructive pulmonary disease, unspecified: Secondary | ICD-10-CM | POA: Diagnosis not present

## 2022-07-15 DIAGNOSIS — I10 Essential (primary) hypertension: Secondary | ICD-10-CM | POA: Diagnosis not present

## 2022-07-15 DIAGNOSIS — J449 Chronic obstructive pulmonary disease, unspecified: Secondary | ICD-10-CM

## 2022-07-17 ENCOUNTER — Other Ambulatory Visit: Payer: Self-pay

## 2022-07-17 MED ORDER — IPRATROPIUM-ALBUTEROL 0.5-2.5 (3) MG/3ML IN SOLN: RESPIRATORY_TRACT | 2 refills | Status: DC

## 2022-07-27 ENCOUNTER — Telehealth: Payer: Self-pay

## 2022-07-27 NOTE — Progress Notes (Signed)
Care Management & Coordination Services Pharmacy Team  Reason for Encounter: Appointment Reminder  Contacted patient to confirm telephone appointment with Arizona Constable, PharmD on 07/29/22 at 1:00 pm.  Spoke with patient on 07/27/2022   Do you have any problems getting your medications? No  What is your top health concern you would like to discuss at your upcoming visit? Wants to ask about her NEXLIZET and how long she needs to be on it.   Have you seen any other providers since your last visit with PCP? No   Chart review:  Recent office visits:  None  Recent consult visits:  None  Hospital visits:  None   Star Rating Drugs:  Medication:  Last Fill: Day Supply None noted   Care Gaps: Annual wellness visit in last year? Yes, 02/19/22   [name], [place] Pharmacist Assistant  [phone]

## 2022-07-29 ENCOUNTER — Ambulatory Visit: Payer: Medicare Other

## 2022-07-29 ENCOUNTER — Other Ambulatory Visit: Payer: Self-pay

## 2022-07-29 MED ORDER — FLUTICASONE FUROATE-VILANTEROL 100-25 MCG/ACT IN AEPB: 1.0000 | INHALATION_SPRAY | Freq: Every day | RESPIRATORY_TRACT | 1 refills | Status: DC

## 2022-07-29 MED ORDER — CARVEDILOL 6.25 MG PO TABS: 6.2500 mg | ORAL_TABLET | Freq: Two times a day (BID) | ORAL | 0 refills | Status: DC

## 2022-07-29 MED ORDER — OMEPRAZOLE 20 MG PO CPDR: 20.0000 mg | DELAYED_RELEASE_CAPSULE | Freq: Every day | ORAL | 1 refills | Status: DC

## 2022-07-29 MED ORDER — ASPIRIN-ACETAMINOPHEN-CAFFEINE 250-250-65 MG PO TABS: 1.0000 | ORAL_TABLET | Freq: Four times a day (QID) | ORAL | 2 refills | Status: DC | PRN

## 2022-07-29 MED ORDER — IPRATROPIUM-ALBUTEROL 0.5-2.5 (3) MG/3ML IN SOLN: RESPIRATORY_TRACT | 2 refills | Status: DC

## 2022-07-29 MED ORDER — FERROUS SULFATE 325 (65 FE) MG PO TABS: ORAL_TABLET | ORAL | 1 refills | Status: DC

## 2022-07-29 MED ORDER — FLUTICASONE PROPIONATE 50 MCG/ACT NA SUSP: NASAL | 2 refills | Status: DC

## 2022-07-29 MED ORDER — LORATADINE 10 MG PO TABS: ORAL_TABLET | ORAL | 1 refills | Status: DC

## 2022-07-29 NOTE — Patient Outreach (Signed)
Care Management & Coordination Services Pharmacy Note  07/29/2022 Name:  Tina Jimenez MRN:  [id number] DOB:  Jan 16, 1941   Recommendations/Changes made from today's visit: -Onboard to [facility] -Renew healthwell grant for Nexlizet   Subjective: Tina Jimenez is an 82 y.o. year old female who is a primary patient of Marge Duncans, [place].  The care coordination team was consulted for assistance with disease management and care coordination needs.    Engaged with patient by telephone for follow up visit.  Recent office visits:  None   Recent consult visits:  None   Hospital visits:  None   Objective:  Lab Results  Component Value Date   CREATININE 0.51 (L) 03/23/2022   BUN 10 03/23/2022   EGFR 95 03/23/2022   GFRNONAA 94 07/10/2020   GFRAA 108 07/10/2020   NA 140 03/23/2022   K 4.8 03/23/2022   CALCIUM 9.6 03/23/2022   CO2 34 (H) 03/23/2022   GLUCOSE 102 (H) 03/23/2022    No results found for: "HGBA1C", "FRUCTOSAMINE", "GFR", "MICROALBUR"  Last diabetic Eye exam: No results found for: "HMDIABEYEEXA"  Last diabetic Foot exam: No results found for: "HMDIABFOOTEX"   Lab Results  Component Value Date   CHOL 147 03/18/2022   HDL 50 03/18/2022   LDLCALC 79 03/18/2022   TRIG 96 03/18/2022   CHOLHDL 2.9 03/18/2022       Latest Ref Rng & Units 03/18/2022    3:55 PM 11/18/2021    4:05 PM 09/15/2021   10:48 AM  Hepatic Function  Total Protein 6.0 - 8.5 g/dL 6.6  6.5  6.6   Albumin 3.8 - 4.8 g/dL 4.3  4.3  4.3   AST 0 - 40 IU/L 20  18  25   $ ALT 0 - 32 IU/L 13  13  16   $ Alk Phosphatase 44 - 121 IU/L 84  68  74   Total Bilirubin 0.0 - 1.2 mg/dL 0.4  0.3  0.3   Bilirubin, Direct 0.00 - 0.40 mg/dL 0.22       Lab Results  Component Value Date/Time   TSH 1.960 03/18/2022 03:55 PM   TSH 1.700 09/15/2021 10:48 AM       Latest Ref Rng & Units 03/18/2022    3:55 PM 11/18/2021    4:05 PM 09/15/2021   10:48 AM  CBC  WBC 3.4 - 10.8 x10E3/uL 10.4  7.9  10.4    Hemoglobin 11.1 - 15.9 g/dL 13.1  12.8  13.9   Hematocrit 34.0 - 46.6 % 41.1  39.7  41.4   Platelets 150 - 450 x10E3/uL 178  215  205     Lab Results  Component Value Date/Time   VD25OH 35.9 03/23/2022 08:42 AM   VD25OH 21.1 (L) 09/15/2021 10:48 AM   VITAMINB12 778 08/24/2019 03:14 PM    Clinical ASCVD: No  The 10-year ASCVD risk score (Arnett DK, et al., 2019) is: 24.8%   Values used to calculate the score:     Age: 34 years     Sex: Female     Is Non-Hispanic African American: No     Diabetic: No     Tobacco smoker: No     Systolic Blood Pressure: [id number] mmHg     Is BP treated: Yes     HDL Cholesterol: 50 mg/dL     Total Cholesterol: 147 mg/dL    Other: (CHADS2VASc if Afib, MMRC or CAT for COPD, ACT, DEXA)     06/17/2022    2:49 PM  02/19/2022    1:46 PM 09/15/2021    9:52 AM  Depression screen PHQ 2/9  Decreased Interest 0 0 0  Down, Depressed, Hopeless 0 0 0  PHQ - 2 Score 0 0 0     Social History   Tobacco Use  Smoking Status Former   Types: Cigarettes   Quit date: 11/01/2018   Years since quitting: 3.7  Smokeless Tobacco Never   BP Readings from Last 3 Encounters:  03/23/22 112/70  03/18/22 (!) 168/88  03/03/22 (!) 140/88   Pulse Readings from Last 3 Encounters:  03/23/22 69  03/03/22 61  01/23/22 87   Wt Readings from Last 3 Encounters:  03/23/22 137 lb (62.1 kg)  03/18/22 137 lb 6.4 oz (62.3 kg)  03/03/22 138 lb (62.6 kg)   BMI Readings from Last 3 Encounters:  03/23/22 24.27 kg/m  03/18/22 24.34 kg/m  03/03/22 26.07 kg/m    Allergies  Allergen Reactions   Multaq [Dronedarone] Diarrhea   Klonopin [Clonazepam] Other (See Comments)    Pt can't remember reaction   Lipitor [Atorvastatin] Other (See Comments)    Pt could barely lift arms   Simvastatin Other (See Comments)    Pt can't remember reaction    Medications Reviewed Today     Reviewed by Vanita Ingles, RN (Case Manager) on 06/17/22 at 1438  Med List Status: <None>   Medication  Order Taking? Sig Documenting Provider Last Dose Status Informant  Ascorbic Acid (VITAMIN C) 500 MG CAPS [id number] No Take 1 tablet by mouth daily. [provider] Taking Active   aspirin EC 81 MG tablet [id number] No Take 81 mg by mouth every other day. Swallow whole. [provider] Taking Active   aspirin-acetaminophen-caffeine (EXCEDRIN MIGRAINE) 3010020457 MG tablet [id number] No Take 1-2 tablets by mouth every 6 (six) hours as needed for headache. [provider] Taking Active   carvedilol (COREG) 6.25 MG tablet [id number]  TAKE 1 TABLET BY MOUTH TWICE A DAY Marge Duncans, PA-C  Active   Co-Enzyme Q-10 100 MG CAPS [id number] No Take 100 mg by mouth daily. [provider] Taking Active Self  digoxin (LANOXIN) 0.125 MG tablet [id number] No Take 1 tablet (125 mcg total) by mouth daily. RevankarReita Cliche, MD Taking Active   DOCUSATE SODIUM PO [id number] No Take 200 mg by mouth daily. [provider] Taking Active   famotidine (PEPCID) 20 MG tablet [id number] No Take 20 mg by mouth daily. [provider] Taking Active   ferrous sulfate 325 (65 FE) MG tablet [id number] No TAKE 1 TABLET BY MOUTH EVERY DAY WITH BREAKFAST Marge Duncans, PA-C Taking Active   fluconazole (DIFLUCAN) 150 MG tablet [id number]  1 po then repeat 3 days later Marge Duncans, PA-C  Active   fluticasone [facility]) 50 MCG/ACT nasal spray [id number]  SPRAY 2 SPRAYS INTO EACH NOSTRIL EVERY DAY Marge Duncans, PA-C  Active   fluticasone furoate-vilanterol (BREO ELLIPTA) 200-25 MCG/ACT AEPB [id number] No Inhale 1 puff into the lungs daily. Marge Duncans, PA-C Taking Active   ipratropium-albuterol (DUONEB) 0.5-2.5 (3) MG/3ML SOLN [id number]  USE 1 NEBULE IN NEBULIZER EVERY MORNING, NOON, EVENING, AND BEDTIME. Marge Duncans, PA-C  Active   LACTOBACILLUS PO [id number] No Take 1 tablet by mouth daily. [provider] Taking Active   loratadine (CLARITIN) 10 MG tablet [id number] No TAKE 1 TABLET BY MOUTH  ONCE DAILY FOR ALLERGIES Marge Duncans, PA-C Taking Active   NEXLIZET 180-10 MG TABS [id number]  TAKE 1 TABLET BY MOUTH DAILY IN  THE AFTERNOON Jenean Lindau, MD  Active   omeprazole (PRILOSEC) 20 MG capsule [id number]  TAKE 1 CAPSULE BY MOUTH EVERY DAY Marge Duncans, PA-C  Active   triamcinolone cream (KENALOG) 0.1 % [id number] No APPLY TO AFFECTED AREA TWICE A DAY Lillard Anes, MD Taking Active             SDOH:  (Social Determinants of Health) assessments and interventions performed: Yes SDOH Interventions    Flowsheet Row Care Coordination from 07/29/2022 in [place] Management from 06/17/2022 in [place] Management from 03/25/2022 in [place] Management from 01/20/2022 in [place] Management from 07/01/2021 in [place] Management from 02/19/2021 in [place]  SDOH Interventions        Food Insecurity Interventions -- Intervention Not Indicated -- -- -- --  Housing Interventions -- Intervention Not Indicated -- -- -- --  Transportation Interventions Intervention Not Indicated Intervention Not Indicated Intervention Not Indicated Intervention Not Indicated -- Other (Comment)  [Son takes her to appointments]  Utilities Interventions -- Intervention Not Indicated -- -- -- --  Alcohol Usage Interventions -- Intervention Not Indicated (Score <7) -- -- -- --  Financial Strain Interventions Other (Comment) Other (Comment)  [Breo Pap and works with pharm D] Other (Comment)  [Breo PAP] Other (Comment) Other (Comment)  [PAP] --  Physical Activity Interventions -- Other (Comments)  [limited in her ability to do exercise due to breathing and her mobility] -- -- -- --  Stress Interventions -- Intervention Not Indicated -- -- -- --  Social Connections Interventions -- Intervention Not Indicated, Other (Comment)   [has good support system from her sons and grandchildren] -- -- -- --       Medication Assistance:   Nexlizet: 2023: Healthwell approved 2024: Healthwell renewed in Feb 2024  [place]: -2023: Approved -2024: Approved  [facility] Reviewed: Is patient disadvantaged to use [facility]?: No  Current Rx insurance plan: [facility] Name and location of Current pharmacy[facility]  [id number] [place] [facility] [id number] [address]Phone: [phone]: [address] [id number]: [phone]: [phone] [facility] services reviewed with patient today?: Yes  Patient requests to transfer care to [facility]?: Yes  -Verbal consent obtained for [facility] enhanced pharmacy services (medication synchronization, adherence packaging, delivery coordination). A medication sync plan was created to allow patient to get all medications delivered once every 30 to 90 days per patient preference. Patient understands they have freedom to choose pharmacy and clinical pharmacist will coordinate care between all prescribers and [facility].   Compliance/Adherence/Medication fill history: Star Rating Drugs:  Medication:                Last Fill:         Day Supply None noted    Care Gaps: Annual wellness visit in last year? Yes, 02/19/22   Assessment/Plan   Cardio (BP goal <140/90) BP Readings from Last 3 Encounters:  03/23/22 112/70  03/18/22 (!) 168/88  03/03/22 (!) 140/88  -Uncontrolled -Current treatment: Carvedilol 6.260mAppropriate, Effective, Safe, Accessible Digoxin 0.12528mppropriate, Effective, Safe, Accessible Lab Results  Component Value  Date   DIGOXIN 0.6 (L) 10/14/2019   -Medications previously tried: N/A  -Current home readings:  Doesn't take -Current dietary habits: (Patient didn't specify) -Current exercise habits: N/A due to mobility -Denies hypotensive/hypertensive symptoms -Educated on BP goals and benefits of medications for prevention of heart attack, stroke and kidney damage; -Counseled to monitor BP at home if possible, document, and provide log at future appointments -Counseled on diet and exercise extensively Sept 2022: Has appt with PCP next week, will defer to them since will be able to get BP reading Jan 2023: BP at goal Feb 2024: Patient didn't have BP results on her   Hyperlipidemia: (LDL goal < 100) The 10-year ASCVD risk score (Arnett DK, et al., 2019) is: 24.8%   Values used to calculate the score:     Age: 55 years     Sex: Female     Is Non-Hispanic African American: No     Diabetic: No     Tobacco smoker: No     Systolic Blood Pressure: XX123456 mmHg     Is BP treated: Yes     HDL Cholesterol: 50 mg/dL     Total Cholesterol: 147 mg/dL Lab Results  Component Value Date   CHOL 147 03/18/2022   CHOL 156 09/15/2021   CHOL 141 03/17/2021   Lab Results  Component Value Date   HDL 50 03/18/2022   HDL 53 09/15/2021   HDL 45 03/17/2021   Lab Results  Component Value Date   LDLCALC 79 03/18/2022   LDLCALC 86 09/15/2021   LDLCALC 77 03/17/2021   Lab Results  Component Value Date   TRIG 96 03/18/2022   TRIG 91 09/15/2021   TRIG 105 03/17/2021   Lab Results  Component Value Date   CHOLHDL 2.9 03/18/2022   CHOLHDL 2.9 09/15/2021   CHOLHDL 3.1 03/17/2021   No results found for: "LDLDIRECT" Last vitamin D Lab Results  Component Value Date   VD25OH 35.9 03/23/2022   Lab Results  Component Value Date   TSH 1.960 03/18/2022  -Current treatment: Nexlizet Fatima Sanger Feb 2023) Appropriate, Effective, Safe, Accessible -Medications previously tried: Statin  -Current dietary patterns: Didn't specify -Current exercise habits: N/A due to mobility -Educated on Cholesterol goals;   -Counseled on diet and exercise extensively Jan 2023: Renew PAP for Feb 2023 August 2023: Renew grant in Feb 2024 October 2023: F/U Feb 2024 to renew Feb 2024: [place] for Nexlizet   COPD (Goal: control symptoms and prevent exacerbations) -Not ideally controlled    03/25/2022    2:09 PM  CAT Score  Total CAT Score 27  -Current treatment  Breo Appropriate, Effective, Safe, Accessible 2023: PAP Approved 2024: PAP Approved Albuterol TID Appropriate, Effective, Safe, Accessible O2 3L -Medications previously tried: N/A  -Patient reports consistent use of maintenance inhaler -Frequency of rescue inhaler use:              -Jan 2023: 3-4x/day  -Feb 2023: 3-4x/day always -Counseled on Proper inhaler technique; Jan 2023: Patient using Albuterol 3-4x/day but is necessary due to constant O2 use. Per note from PCP, patient at goal and controlled August 2023: Patient can't afford breo. Was going to conduct CAT score but patient's main priority was Breo cost. Will have concierge do CAT next month October 2023: Still waiting on Breo PAP. Gave patient phone number to call to check on status. Will have CCM team reach out in a few weeks with patient to see status Feb 2023: PAP approved!  CPP f/U  PRN  [place] Constable, Pharm.D. - [phone]

## 2022-07-30 ENCOUNTER — Telehealth: Payer: Self-pay

## 2022-07-30 ENCOUNTER — Other Ambulatory Visit: Payer: Self-pay

## 2022-07-30 MED ORDER — DIGOXIN 125 MCG PO TABS: 125.0000 ug | ORAL_TABLET | Freq: Every day | ORAL | 1 refills | Status: DC

## 2022-07-30 NOTE — Progress Notes (Signed)
Onboarding pt to [facility] for 90ds Vials w/o safety caps  Aspirin 79m- OTC Vitamin C 5033mOTC Vitamin D 50071mTC Aspirin -Acetaminophen 250-250-39m71mC Co-Enzyme Q10 100mg72m Docusate Sodium 200mg-7mtaking  Famotidine 20mg-N42mnger taking  Ferrous Sulfate 325mg-Pt67mted she gets it cheaper on Amazon than through insurance Breo Ellipta 100-25mcg- P58mts this free through [employer] [place]- Get25m PAP  Delivery of Medications needed Carvedilol 6.25mg X Dav78mebruary 14, 2024      Cycle Fill 07/29/22 90ds Oct 24, 2022               Digoxin 0.125mg  Revan24m      Cycle Fill 05/30/22 90ds August 28, 2022                                         Fluticasone 50mcg X Davi24mbruPaulla Dolly 2024      Cycle Fill 05/29/22 90ds August 27, 2022  Ipratropium-Albuterol 0.5-2.5 X Davis FebruPaulla Dolly 2024      Cycle Fill 07/24/22 30ds August 23, 2022  Omeprazole 20mg X [place] 44muaPaulla Dolly2024      Cycle Fill 07/29/22 90ds Oct 24, 2022    Scripts have already been sent in by PCP Requested Digoxin 0.125mg through m59mge to Dr. Revankar   DaniEula ListenPhMauriceistant  [phone]

## 2022-07-30 NOTE — Progress Notes (Cosign Needed)
Re-enrolled patient with [employer] for [employer]. Patient has been approved.  HEALTHWELL ID: [id number]  REFERENCE NUMBER: [id number]  Crissie Sickles FUND: Hypercholesterolemia - Medicare Access  ASSISTANCE TYPE: Co-pay  START DATE: 07/20/2022  END DATE : 07/20/2023    Pattricia Boss, [place] Clinical Pharmacist Assistant [phone]

## 2022-08-10 ENCOUNTER — Other Ambulatory Visit: Payer: Self-pay

## 2022-08-10 DIAGNOSIS — I2510 Atherosclerotic heart disease of native coronary artery without angina pectoris: Secondary | ICD-10-CM

## 2022-08-10 DIAGNOSIS — E782 Mixed hyperlipidemia: Secondary | ICD-10-CM

## 2022-08-10 MED ORDER — NEXLIZET 180-10 MG PO TABS: 1.0000 | ORAL_TABLET | Freq: Every day | ORAL | 1 refills | Status: DC

## 2022-08-11 DIAGNOSIS — J449 Chronic obstructive pulmonary disease, unspecified: Secondary | ICD-10-CM | POA: Diagnosis not present

## 2022-08-13 DIAGNOSIS — J449 Chronic obstructive pulmonary disease, unspecified: Secondary | ICD-10-CM | POA: Diagnosis not present

## 2022-08-16 ENCOUNTER — Other Ambulatory Visit: Payer: Self-pay | Admitting: Physician Assistant

## 2022-08-19 ENCOUNTER — Ambulatory Visit (INDEPENDENT_AMBULATORY_CARE_PROVIDER_SITE_OTHER): Payer: Medicare Other

## 2022-08-19 ENCOUNTER — Telehealth: Payer: Medicare Other

## 2022-08-19 ENCOUNTER — Telehealth: Payer: Self-pay

## 2022-08-19 DIAGNOSIS — J449 Chronic obstructive pulmonary disease, unspecified: Secondary | ICD-10-CM

## 2022-08-19 DIAGNOSIS — I10 Essential (primary) hypertension: Secondary | ICD-10-CM

## 2022-08-19 NOTE — Chronic Care Management (AMB) (Signed)
Chronic Care Management   CCM RN Visit Note  08/19/2022 Name: Tina Jimenez MRN: [id number] DOB: Jan 12, 1941  Subjective: Tina Jimenez is a 82 y.o. year old female who is a primary care patient of Marge Duncans, Hershal Coria. The patient was referred to the Chronic Care Management team for assistance with care management needs subsequent to provider initiation of CCM services and plan of care.    Today's Visit:  Engaged with patient by telephone for follow up visit.        Goals Addressed             This Visit's Progress    CCM Expected Outcome:  Monitor, Self-Manage, and Reduce Symptoms of Hypertension       Current Barriers:  Chronic Disease Management support and education needs related to Effective management of HTN BP Readings from Last 3 Encounters:  03/23/22 112/70  03/18/22 (!) 168/88  03/03/22 (!) 140/88     Planned Interventions: Evaluation of current treatment plan related to hypertension self management and patient's adherence to plan as established by provider. The patient states that her blood pressures is better. She has been a little stressed out but denies any acute changes in her blood pressures. Education provided on not worrying and know she has the support from the CCM team for help with any needs she has related to her health and well being ;   Provided education to patient re: stroke prevention, s/s of heart attack and stroke; Reviewed prescribed diet heart healthy diet. The patient sometimes has a good appetite and sometimes does not. Education on staying hydrated and on the days she is not eating well to drink protein drinks to help with getting her nutrients. The patient stats she does like these types of things and has done this before.  Reviewed medications with patient and discussed importance of compliance. The patient is compliant with medications and works with the pharm D on a regular basis  Discussed plans with patient for ongoing care management  follow up and provided patient with direct contact information for care management team; Advised patient, providing education and rationale, to monitor blood pressure daily and record, calling PCP for findings outside established parameters;  Reviewed scheduled/upcoming provider appointments including: 10-08-2022 at 0900 am Advised patient to discuss changes in blood pressure or heart health with provider; Provided education on prescribed diet heart healthy diet;  Discussed complications of poorly controlled blood pressure such as heart disease, stroke, circulatory complications, vision complications, kidney impairment, sexual dysfunction;  Screening for signs and symptoms of depression related to chronic disease state;  Assessed social determinant of health barriers;   Symptom Management: Take medications as prescribed   Attend all scheduled provider appointments Call provider office for new concerns or questions  call the Suicide and Crisis Lifeline: 988 call the [place] National Suicide Prevention Lifeline: 6616648691 or TTY: 365-107-2739 TTY (740)425-7065) to talk to a trained counselor call 1-800-273-TALK (toll free, 24 hour hotline) if experiencing a Mental Health or [place]  check blood pressure weekly learn about high blood pressure call doctor for signs and symptoms of high blood pressure develop an action plan for high blood pressure keep all doctor appointments take medications for blood pressure exactly as prescribed report new symptoms to your doctor  Follow Up Plan: Telephone follow up appointment with care management team member scheduled for: 10-14-2022 at 145 pm       CCM:  Maintain, Monitor and Self-Manage Symptoms of COPD  Current Barriers:  Care Coordination needs related to cost constraints to inhalers, currently working with the pharm D in a patient with COPD Chronic Disease Management support and education needs related to effective management  of COPD  Planned Interventions: Provided patient with basic written and verbal COPD education on self care/management/and exacerbation prevention. The patient was not having the best day. She states that she just did not feel well and it was likely because of the weather. She says that she has noticed getting more short of breath with even going to the bathroom and she has to be mindful of her activity. She also has been worried because she gave the pharm D permission [facility] but now she has changed her mind. Collaboration with pharm D and Publishing copy. The pharm D has called and talked to the patient.  Advised patient to track and manage COPD triggers. Education on things that may trigger her COPD. She is pacing her activity and knows her limitations. The patient states that the weather sometimes impacts her breathing also. Reflective listening and support  Provided written and verbal instructions on pursed lip breathing and utilized returned demonstration as teach back Provided instruction about proper use of medications used for management of COPD including inhalers Advised patient to self assesses COPD action plan zone and make appointment with provider if in the yellow zone for 48 hours without improvement Advised patient to engage in light exercise as tolerated 3-5 days a week to aid in the the management of COPD Provided education about and advised patient to utilize infection prevention strategies to reduce risk of respiratory infection Discussed the importance of adequate rest and management of fatigue with COPD Screening for signs and symptoms of depression related to chronic disease state  Assessed social determinant of health barriers  Symptom Management: Take medications as prescribed   Attend all scheduled provider appointments Call provider office for new concerns or questions  call the Suicide and Crisis Lifeline: 988 call the [place] National Suicide  Prevention Lifeline: 802-207-0166 or TTY: (941)463-0975 TTY (424) 477-1324) to talk to a trained counselor call 1-800-273-TALK (toll free, 24 hour hotline) if experiencing a Mental Health or [place]  identify and remove indoor air pollutants limit outdoor activity during cold weather listen for public air quality announcements every day do breathing exercises every day develop a rescue plan eliminate symptom triggers at home follow rescue plan if symptoms flare-up  Follow Up Plan: Telephone follow up appointment with care management team member scheduled for: 10-14-2022 at 145 pm          Plan:Telephone follow up appointment with care management team member scheduled for:  10-14-2022 at 145 pm  [place], MSN, CCM RN Care Manager  Chronic Care Management Direct Number: [phone]

## 2022-08-19 NOTE — Patient Instructions (Addendum)
Please call the care guide team at [phone] if you need to cancel or reschedule your appointment.   If you are experiencing a Mental Health or [place] or need someone to talk to, please call the Suicide and Crisis Lifeline: 988 call the [place] National Suicide Prevention Lifeline: 510-753-8588 or TTY: 928-710-9150 TTY 949-339-7322) to talk to a trained counselor call 1-800-273-TALK (toll free, 24 hour hotline) go to [facility] [phone])   Following is a copy of the CCM Program Consent:  CCM service includes personalized support from designated clinical staff supervised by the physician, including individualized plan of care and coordination with other care providers 24/7 contact phone numbers for assistance for urgent and routine care needs. Service will only be billed when office clinical staff spend 20 minutes or more in a month to coordinate care. Only one practitioner may furnish and bill the service in a calendar month. The patient may stop CCM services at amy time (effective at the end of the month) by phone call to the office staff. The patient will be responsible for cost sharing (co-pay) or up to 20% of the service fee (after annual deductible is met)  Following is a copy of your full provider care plan:   Goals Addressed             This Visit's Progress    CCM Expected Outcome:  Monitor, Self-Manage, and Reduce Symptoms of Hypertension       Current Barriers:  Chronic Disease Management support and education needs related to Effective management of HTN BP Readings from Last 3 Encounters:  03/23/22 112/70  03/18/22 (!) 168/88  03/03/22 (!) 140/88     Planned Interventions: Evaluation of current treatment plan related to hypertension self management and patient's adherence to plan as established by provider. The patient states that her blood pressures is better. She has been a little  stressed out but denies any acute changes in her blood pressures. Education provided on not worrying and know she has the support from the CCM team for help with any needs she has related to her health and well being ;   Provided education to patient re: stroke prevention, s/s of heart attack and stroke; Reviewed prescribed diet heart healthy diet. The patient sometimes has a good appetite and sometimes does not. Education on staying hydrated and on the days she is not eating well to drink protein drinks to help with getting her nutrients. The patient stats she does like these types of things and has done this before.  Reviewed medications with patient and discussed importance of compliance. The patient is compliant with medications and works with the pharm D on a regular basis  Discussed plans with patient for ongoing care management follow up and provided patient with direct contact information for care management team; Advised patient, providing education and rationale, to monitor blood pressure daily and record, calling PCP for findings outside established parameters;  Reviewed scheduled/upcoming provider appointments including: 10-08-2022 at 0900 am Advised patient to discuss changes in blood pressure or heart health with provider; Provided education on prescribed diet heart healthy diet;  Discussed complications of poorly controlled blood pressure such as heart disease, stroke, circulatory complications, vision complications, kidney impairment, sexual dysfunction;  Screening for signs and symptoms of depression related to chronic disease state;  Assessed social determinant of health barriers;   Symptom Management: Take medications as prescribed   Attend all scheduled provider appointments Call provider  office for new concerns or questions  call the Suicide and Crisis Lifeline: 988 call the [place] National Suicide Prevention Lifeline: 940-420-4035 or TTY: 405-115-4925 TTY 703 309 3727) to talk  to a trained counselor call 1-800-273-TALK (toll free, 24 hour hotline) if experiencing a Mental Health or [place]  check blood pressure weekly learn about high blood pressure call doctor for signs and symptoms of high blood pressure develop an action plan for high blood pressure keep all doctor appointments take medications for blood pressure exactly as prescribed report new symptoms to your doctor  Follow Up Plan: Telephone follow up appointment with care management team member scheduled for: 10-14-2022 at 145 pm       CCM:  Maintain, Monitor and Self-Manage Symptoms of COPD       Current Barriers:  Care Coordination needs related to cost constraints to inhalers, currently working with the pharm D in a patient with COPD Chronic Disease Management support and education needs related to effective management of COPD  Planned Interventions: Provided patient with basic written and verbal COPD education on self care/management/and exacerbation prevention. The patient was not having the best day. She states that she just did not feel well and it was likely because of the weather. She says that she has noticed getting more short of breath with even going to the bathroom and she has to be mindful of her activity. She also has been worried because she gave the pharm D permission to [facility] but now she has changed her mind. Collaboration with pharm D and Publishing copy. The pharm D has called and talked to the patient.  Advised patient to track and manage COPD triggers. Education on things that may trigger her COPD. She is pacing her activity and knows her limitations. The patient states that the weather sometimes impacts her breathing also. Reflective listening and support  Provided written and verbal instructions on pursed lip breathing and utilized returned demonstration as teach back Provided instruction about proper use of medications used for management  of COPD including inhalers Advised patient to self assesses COPD action plan zone and make appointment with provider if in the yellow zone for 48 hours without improvement Advised patient to engage in light exercise as tolerated 3-5 days a week to aid in the the management of COPD Provided education about and advised patient to utilize infection prevention strategies to reduce risk of respiratory infection Discussed the importance of adequate rest and management of fatigue with COPD Screening for signs and symptoms of depression related to chronic disease state  Assessed social determinant of health barriers  Symptom Management: Take medications as prescribed   Attend all scheduled provider appointments Call provider office for new concerns or questions  call the Suicide and Crisis Lifeline: 988 call the [place] National Suicide Prevention Lifeline: 339-328-6647 or TTY: 226-228-7642 TTY (249)331-1826) to talk to a trained counselor call 1-800-273-TALK (toll free, 24 hour hotline) if experiencing a Mental Health or [place]  identify and remove indoor air pollutants limit outdoor activity during cold weather listen for public air quality announcements every day do breathing exercises every day develop a rescue plan eliminate symptom triggers at home follow rescue plan if symptoms flare-up  Follow Up Plan: Telephone follow up appointment with care management team member scheduled for: 10-14-2022 at 145 pm          The patient verbalized understanding of instructions, educational materials, and care plan provided today and agreed to receive a  mailed copy of patient instructions, educational materials, and care plan.   Telephone follow up appointment with care management team member scheduled for: 10-14-2022 at 145 pm  Heart-Healthy Eating Plan Eating a healthy diet is important for the health of your heart. A heart-healthy eating plan includes: Eating less unhealthy  fats. Eating more healthy fats. Eating less salt in your food. Salt is also called sodium. Making other changes in your diet. Talk with your doctor or a diet specialist (dietitian) to create an eating plan that is right for you. What is my plan? Your doctor may recommend an eating plan that includes: Total fat: ______% or less of total calories a day. Saturated fat: ______% or less of total calories a day. Cholesterol: less than _________mg a day. Sodium: less than _________mg a day. What are tips for following this plan? Cooking Avoid frying your food. Try to bake, boil, grill, or broil it instead. You can also reduce fat by: Removing the skin from poultry. Removing all visible fats from meats. Steaming vegetables in water or broth. Meal planning  At meals, divide your plate into four equal parts: Fill one-half of your plate with vegetables and green salads. Fill one-fourth of your plate with whole grains. Fill one-fourth of your plate with lean protein foods. Eat 2-4 cups of vegetables per day. One cup of vegetables is: 1 cup (91 g) broccoli or cauliflower florets. 2 medium carrots. 1 large bell pepper. 1 large sweet potato. 1 large tomato. 1 medium white potato. 2 cups (150 g) raw leafy greens. Eat 1-2 cups of fruit per day. One cup of fruit is: 1 small apple 1 large banana 1 cup (237 g) mixed fruit, 1 large orange,  cup (82 g) dried fruit, 1 cup (240 mL) 100% fruit juice. Eat more foods that have soluble fiber. These are apples, broccoli, carrots, beans, peas, and barley. Try to get 20-30 g of fiber per day. Eat 4-5 servings of nuts, legumes, and seeds per week: 1 serving of dried beans or legumes equals  cup (90 g) cooked. 1 serving of nuts is  oz (12 almonds, 24 pistachios, or 7 walnut halves). 1 serving of seeds equals  oz (8 g). General information Eat more home-cooked food. Eat less restaurant, buffet, and fast food. Limit or avoid alcohol. Limit foods  that are high in starch and sugar. Avoid fried foods. Lose weight if you are overweight. Keep track of how much salt (sodium) you eat. This is important if you have high blood pressure. Ask your doctor to tell you more about this. Try to add vegetarian meals each week. Fats Choose healthy fats. These include olive oil and canola oil, flaxseeds, walnuts, almonds, and seeds. Eat more omega-3 fats. These include salmon, mackerel, sardines, tuna, flaxseed oil, and ground flaxseeds. Try to eat fish at least 2 times each week. Check food labels. Avoid foods with trans fats or high amounts of saturated fat. Limit saturated fats. These are often found in animal products, such as meats, butter, and cream. These are also found in plant foods, such as palm oil, palm kernel oil, and coconut oil. Avoid foods with partially hydrogenated oils in them. These have trans fats. Examples are stick margarine, some tub margarines, cookies, crackers, and other baked goods. What foods should I eat? Fruits All fresh, canned (in natural juice), or frozen fruits. Vegetables Fresh or frozen vegetables (raw, steamed, roasted, or grilled). Green salads. Grains Most grains. Choose whole wheat and whole grains most of the  time. Rice and pasta, including brown rice and pastas made with whole wheat. Meats and other proteins Lean, well-trimmed beef, veal, pork, and lamb. Chicken and [place] without skin. All fish and shellfish. Wild duck, rabbit, pheasant, and venison. Egg whites or low-cholesterol egg substitutes. Dried beans, peas, lentils, and tofu. Seeds and most nuts. Dairy Low-fat or nonfat cheeses, including ricotta and mozzarella. Skim or 1% milk that is liquid, powdered, or evaporated. Buttermilk that is made with low-fat milk. Nonfat or low-fat yogurt. Fats and oils Non-hydrogenated (trans-free) margarines. Vegetable oils, including soybean, sesame, sunflower, olive, peanut, safflower, corn, canola, and cottonseed.  Salad dressings or mayonnaise made with a vegetable oil. Beverages Mineral water. Coffee and tea. Diet carbonated beverages. Sweets and desserts Sherbet, gelatin, and fruit ice. Small amounts of dark chocolate. Limit all sweets and desserts. Seasonings and condiments All seasonings and condiments. The items listed above may not be a complete list of foods and drinks you can eat. Contact a dietitian for more options. What foods should I avoid? Fruits Canned fruit in heavy syrup. Fruit in cream or butter sauce. Fried fruit. Limit coconut. Vegetables Vegetables cooked in cheese, cream, or butter sauce. Fried vegetables. Grains Breads that are made with saturated or trans fats, oils, or whole milk. Croissants. Sweet rolls. Donuts. High-fat crackers, such as cheese crackers. Meats and other proteins Fatty meats, such as hot dogs, ribs, sausage, bacon, rib-eye roast or steak. High-fat deli meats, such as salami and bologna. Caviar. Domestic duck and goose. Organ meats, such as liver. Dairy Cream, sour cream, cream cheese, and creamed cottage cheese. Whole-milk cheeses. Whole or 2% milk that is liquid, evaporated, or condensed. Whole buttermilk. Cream sauce or high-fat cheese sauce. Yogurt that is made from whole milk. Fats and oils Meat fat, or shortening. Cocoa butter, hydrogenated oils, palm oil, coconut oil, palm kernel oil. Solid fats and shortenings, including bacon fat, salt pork, lard, and butter. Nondairy cream substitutes. Salad dressings with cheese or sour cream. Beverages Regular sodas and juice drinks with added sugar. Sweets and desserts Frosting. Pudding. Cookies. Cakes. Pies. Milk chocolate or white chocolate. Buttered syrups. Full-fat ice cream or ice cream drinks. The items listed above may not be a complete list of foods and drinks to avoid. Contact a dietitian for more information. Summary Heart-healthy meal planning includes eating less unhealthy fats, eating more healthy  fats, and making other changes in your diet. Eat a balanced diet. This includes fruits and vegetables, low-fat or nonfat dairy, lean protein, nuts and legumes, whole grains, and heart-healthy oils and fats. This information is not intended to replace advice given to you by your health care provider. Make sure you discuss any questions you have with your health care provider. Document Revised: 07/07/2021 Document Reviewed: 07/07/2021 Elsevier Patient Education  [place]. Mindfulness-Based Stress Reduction Mindfulness-based stress reduction (MBSR) is a program that helps people learn to practice mindfulness. Mindfulness is the practice of consciously paying attention to the present moment. MBSR focuses on developing self-awareness, which lets you respond to life stress without judgment or negative feelings. It can be learned and practiced through techniques such as education, breathing exercises, meditation, and yoga. MBSR includes several mindfulness techniques in one program. MBSR works best when you understand the treatment, are willing to try new things, and can commit to spending time practicing what you learn. MBSR training may include learning about: How your feelings, thoughts, and reactions affect your body. New ways to respond to things that cause negative thoughts to  start (triggers). How to notice your thoughts and let go of them. Practicing awareness of everyday things that you normally do without thinking. The techniques and goals of different types of meditation. What are the benefits of MBSR? MBSR can have many benefits, which include helping you to: Develop self-awareness. This means knowing and understanding yourself. Learn skills and attitudes that help you to take part in your own health care. Learn new ways to care for yourself. Be more accepting about how things are, and let things go. Be less judgmental and approach things with an open mind. Be patient with yourself  and trust yourself more. MBSR has also been shown to: Reduce negative emotions, such as sadness, overwhelm, and worry. Improve memory and focus. Change how you sense and react to pain. Boost your body's ability to fight infections. Help you connect better with other people. Improve your sense of well-being. How to practice mindfulness To do a basic awareness exercise: Find a comfortable place to sit. Pay attention to the present moment. Notice your thoughts, feelings, and surroundings just as they are. Avoid judging yourself, your feelings, or your surroundings. Make note of any judgment that comes up and let it go. Your mind may wander, and that is okay. Make note of when your thoughts drift, and return your attention to the present moment. To do basic mindfulness meditation: Find a comfortable place to sit. This may include a stable chair or a firm floor cushion. Sit upright with your back straight. Let your arms fall next to your sides, with your hands resting on your legs. If you are sitting in a chair, rest your feet flat on the floor. If you are sitting on a cushion, cross your legs in front of you. Keep your head in a neutral position with your chin dropped slightly. Relax your jaw and rest the tip of your tongue on the roof of your mouth. Drop your gaze to the floor or close your eyes. Breathe normally and pay attention to your breath. Feel the air moving in and out of your nose. Feel your belly expanding and relaxing with each breath. Your mind may wander, and that is okay. Make note of when your thoughts drift, and return your attention to your breath. Avoid judging yourself, your feelings, or your surroundings. Make note of any judgment or feelings that come up, let them go, and bring your attention back to your breath. When you are ready, lift your gaze or open your eyes. Pay attention to how your body feels after the meditation. Follow these instructions at home:  Find a local  in-person or online MBSR program. Set aside some time regularly for mindfulness practice. Practice every day if you can. Even 10 minutes of practice is helpful. Find a mindfulness practice that works best for you. This may include one or more of the following: Meditation. This involves focusing your mind on a certain thought or activity. Breathing awareness exercises. These help you to stay present by focusing on your breath. Body scan. For this practice, you lie down and pay attention to each part of your body from head to toe. You can identify tension and soreness and consciously relax parts of your body. Yoga. Yoga involves stretching and breathing, and it can improve your ability to move and be flexible. It can also help you to test your body's limits, which can help you release stress. Mindful eating. This way of eating involves focusing on the taste, texture, color, and smell of  each bite of food. This slows down eating and helps you feel full sooner. For this reason, it can be an important part of a weight loss plan. Find a podcast or recording that provides guidance for breathing awareness, body scan, or meditation exercises. You can listen to these any time when you have a free moment to rest without distractions. Follow your treatment plan as told by your health care provider. This may include taking regular medicines and making changes to your diet or lifestyle as recommended. Where to find more information You can find more information about MBSR from: Your health care provider. Community-based meditation centers or programs. Programs offered near you. Summary Mindfulness-based stress reduction (MBSR) is a program that teaches you how to consciously pay attention to the present moment. It is used to help you deal better with daily stress, feelings, and pain. MBSR focuses on developing self-awareness, which allows you to respond to life stress without judgment or negative feelings. MBSR  programs may involve learning different mindfulness practices, such as breathing exercises, meditation, yoga, body scan, or mindful eating. Find a mindfulness practice that works best for you, and set aside time for it on a regular basis. This information is not intended to replace advice given to you by your health care provider. Make sure you discuss any questions you have with your health care provider. Document Revised: 01/09/2021 Document Reviewed: 01/09/2021 Elsevier Patient Education  [place]. COPD and Physical Activity Chronic obstructive pulmonary disease (COPD) is a long-term, or chronic, condition that affects the lungs. COPD is a general term that can be used to describe many problems that cause inflammation of the lungs and limit airflow. These conditions include chronic bronchitis and emphysema. The main symptom of COPD is shortness of breath, which makes it harder to do even simple tasks. This can also make it harder to exercise and stay active. Talk with your health care provider about treatments to help you breathe better and actions you can take to prevent breathing problems during physical activity. What are the benefits of exercising when you have COPD? Exercising regularly is an important part of a healthy lifestyle. You can still exercise and do physical activities even though you have COPD. Exercise and physical activity improve your shortness of breath by increasing blood flow (circulation). This causes your heart to pump more oxygen through your body. Moderate exercise can: Improve oxygen use. Increase your energy level. Help with shortness of breath. Strengthen your breathing muscles. Improve heart health. Help with sleep. Improve your self-esteem and feelings of self-worth. Lower depression, stress, and anxiety. Exercise can benefit everyone with COPD. The severity of your disease may affect how hard you can exercise, especially at first, but everyone can  benefit. Talk with your health care provider about how much exercise is safe for you, and which activities and exercises are safe for you. What actions can I take to prevent breathing problems during physical activity? Sign up for a pulmonary rehabilitation program. This type of program may include: Education about lung diseases. Exercise classes that teach you how to exercise and be more active while improving your breathing. This usually involves: Exercise using your lower extremities, such as a stationary bicycle. About 30 minutes of exercise, 2 to 5 times per week, for 6 to 12 weeks. Strength training, such as push-ups or leg lifts. Nutrition education. Group classes in which you can talk with others who also have COPD and learn ways to manage stress. If you use an  oxygen tank, you should use it while you exercise. Work with your health care provider to adjust your oxygen for your physical activity. Your resting flow rate is different from your flow rate during physical activity. How to manage your breathing while exercising While you are exercising: Take slow breaths. Pace yourself, and do nottry to go too fast. Purse your lips while breathing out. Pursing your lips is similar to a kissing or whistling position. If doing exercise that uses a quick burst of effort, such as weight lifting: Breathe in before starting the exercise. Breathe out during the hardest part of the exercise, such as raising the weights. Where to find support You can find support for exercising with COPD from: Your health care provider. A pulmonary rehabilitation program. Your local health department or community health programs. Support groups, either online or in-person. Your health care provider may be able to recommend support groups. Where to find more information You can find more information about exercising with COPD from: American Lung Association: lung.org COPD Foundation: copdfoundation.org Contact a  health care provider if: Your symptoms get worse. You have nausea. You have a fever. You want to start a new exercise program or a new activity. Get help right away if: You have chest pain. You cannot breathe. These symptoms may represent a serious problem that is an emergency. Do not wait to see if the symptoms will go away. Get medical help right away. Call your local emergency services (911 in the [place] Do not drive yourself to the hospital. Summary COPD is a general term that can be used to describe many different lung problems that cause lung inflammation and limit airflow. This includes chronic bronchitis and emphysema. Exercise and physical activity improve your shortness of breath by increasing blood flow (circulation). This causes your heart to provide more oxygen to your body. Contact your health care provider before starting any exercise program or new activity. Ask your health care provider what exercises and activities are safe for you. This information is not intended to replace advice given to you by your health care provider. Make sure you discuss any questions you have with your health care provider. Document Revised: 04/09/2020 Document Reviewed: 04/09/2020 Elsevier Patient Education  [place].

## 2022-08-19 NOTE — Telephone Encounter (Signed)
Patient no longer wishes to use [facility]. Is able to get Nex[facility]et for free from [facility] and would like to stay with that. I explained that it'll be free no matter where she picks it up but she'd like to stick with [facility]. Cancelled onboarding and let [facility] know.  Counseled patient to call [facility] for refills in the future

## 2022-08-20 ENCOUNTER — Other Ambulatory Visit: Payer: Self-pay

## 2022-08-20 MED ORDER — FLUTICASONE PROPIONATE 50 MCG/ACT NA SUSP: NASAL | 2 refills | Status: DC

## 2022-09-09 DIAGNOSIS — J449 Chronic obstructive pulmonary disease, unspecified: Secondary | ICD-10-CM | POA: Diagnosis not present

## 2022-09-13 DIAGNOSIS — I10 Essential (primary) hypertension: Secondary | ICD-10-CM | POA: Diagnosis not present

## 2022-09-13 DIAGNOSIS — J449 Chronic obstructive pulmonary disease, unspecified: Secondary | ICD-10-CM

## 2022-09-14 ENCOUNTER — Telehealth: Payer: Self-pay

## 2022-09-14 ENCOUNTER — Other Ambulatory Visit: Payer: Self-pay | Admitting: Physician Assistant

## 2022-09-14 MED ORDER — FLUTICASONE FUROATE-VILANTEROL 200-25 MCG/ACT IN AEPB: 1.0000 | INHALATION_SPRAY | Freq: Every day | RESPIRATORY_TRACT | 11 refills | Status: DC

## 2022-09-14 NOTE — Telephone Encounter (Signed)
Patient called and would like a RX sent in for [facility]o [facility] to [facility] on [address].

## 2022-09-14 NOTE — Telephone Encounter (Signed)
done

## 2022-09-28 ENCOUNTER — Other Ambulatory Visit: Payer: Self-pay | Admitting: Physician Assistant

## 2022-09-28 ENCOUNTER — Other Ambulatory Visit: Payer: Self-pay | Admitting: Family Medicine

## 2022-09-28 MED ORDER — FLUTICASONE FUROATE-VILANTEROL 200-25 MCG/ACT IN AEPB: 1.0000 | INHALATION_SPRAY | Freq: Every day | RESPIRATORY_TRACT | 11 refills | Status: DC

## 2022-10-06 DIAGNOSIS — R079 Chest pain, unspecified: Secondary | ICD-10-CM | POA: Diagnosis not present

## 2022-10-06 DIAGNOSIS — I160 Hypertensive urgency: Secondary | ICD-10-CM | POA: Diagnosis not present

## 2022-10-06 DIAGNOSIS — J9621 Acute and chronic respiratory failure with hypoxia: Secondary | ICD-10-CM | POA: Diagnosis not present

## 2022-10-06 DIAGNOSIS — J441 Chronic obstructive pulmonary disease with (acute) exacerbation: Secondary | ICD-10-CM | POA: Diagnosis not present

## 2022-10-06 DIAGNOSIS — Z79899 Other long term (current) drug therapy: Secondary | ICD-10-CM | POA: Diagnosis not present

## 2022-10-06 DIAGNOSIS — Z7952 Long term (current) use of systemic steroids: Secondary | ICD-10-CM | POA: Diagnosis not present

## 2022-10-06 DIAGNOSIS — I509 Heart failure, unspecified: Secondary | ICD-10-CM | POA: Diagnosis not present

## 2022-10-06 DIAGNOSIS — J9620 Acute and chronic respiratory failure, unspecified whether with hypoxia or hypercapnia: Secondary | ICD-10-CM | POA: Diagnosis not present

## 2022-10-06 DIAGNOSIS — Z86711 Personal history of pulmonary embolism: Secondary | ICD-10-CM | POA: Diagnosis not present

## 2022-10-06 DIAGNOSIS — Z888 Allergy status to other drugs, medicaments and biological substances status: Secondary | ICD-10-CM | POA: Diagnosis not present

## 2022-10-06 DIAGNOSIS — R0602 Shortness of breath: Secondary | ICD-10-CM | POA: Diagnosis not present

## 2022-10-06 DIAGNOSIS — Z9981 Dependence on supplemental oxygen: Secondary | ICD-10-CM | POA: Diagnosis not present

## 2022-10-06 DIAGNOSIS — Z87440 Personal history of urinary (tract) infections: Secondary | ICD-10-CM | POA: Diagnosis not present

## 2022-10-06 DIAGNOSIS — Z7982 Long term (current) use of aspirin: Secondary | ICD-10-CM | POA: Diagnosis not present

## 2022-10-06 DIAGNOSIS — R6889 Other general symptoms and signs: Secondary | ICD-10-CM | POA: Diagnosis not present

## 2022-10-06 DIAGNOSIS — Z87891 Personal history of nicotine dependence: Secondary | ICD-10-CM | POA: Diagnosis not present

## 2022-10-06 DIAGNOSIS — J9601 Acute respiratory failure with hypoxia: Secondary | ICD-10-CM | POA: Diagnosis not present

## 2022-10-06 DIAGNOSIS — J9690 Respiratory failure, unspecified, unspecified whether with hypoxia or hypercapnia: Secondary | ICD-10-CM | POA: Diagnosis not present

## 2022-10-06 DIAGNOSIS — Z792 Long term (current) use of antibiotics: Secondary | ICD-10-CM | POA: Diagnosis not present

## 2022-10-06 DIAGNOSIS — N390 Urinary tract infection, site not specified: Secondary | ICD-10-CM | POA: Diagnosis not present

## 2022-10-06 DIAGNOSIS — I4811 Longstanding persistent atrial fibrillation: Secondary | ICD-10-CM | POA: Diagnosis not present

## 2022-10-06 DIAGNOSIS — Z22322 Carrier or suspected carrier of Methicillin resistant Staphylococcus aureus: Secondary | ICD-10-CM | POA: Diagnosis not present

## 2022-10-06 DIAGNOSIS — I110 Hypertensive heart disease with heart failure: Secondary | ICD-10-CM | POA: Diagnosis not present

## 2022-10-06 DIAGNOSIS — Z743 Need for continuous supervision: Secondary | ICD-10-CM | POA: Diagnosis not present

## 2022-10-06 DIAGNOSIS — M1990 Unspecified osteoarthritis, unspecified site: Secondary | ICD-10-CM | POA: Diagnosis not present

## 2022-10-06 DIAGNOSIS — R0789 Other chest pain: Secondary | ICD-10-CM | POA: Diagnosis not present

## 2022-10-06 DIAGNOSIS — I700 Atherosclerosis of aorta: Secondary | ICD-10-CM | POA: Diagnosis not present

## 2022-10-06 DIAGNOSIS — J439 Emphysema, unspecified: Secondary | ICD-10-CM | POA: Diagnosis not present

## 2022-10-06 DIAGNOSIS — J449 Chronic obstructive pulmonary disease, unspecified: Secondary | ICD-10-CM | POA: Diagnosis not present

## 2022-10-06 DIAGNOSIS — Z8616 Personal history of COVID-19: Secondary | ICD-10-CM | POA: Diagnosis not present

## 2022-10-06 DIAGNOSIS — I4891 Unspecified atrial fibrillation: Secondary | ICD-10-CM | POA: Diagnosis not present

## 2022-10-07 DIAGNOSIS — I4891 Unspecified atrial fibrillation: Secondary | ICD-10-CM | POA: Diagnosis not present

## 2022-10-08 ENCOUNTER — Ambulatory Visit: Payer: Medicare Other | Admitting: Physician Assistant

## 2022-10-08 DIAGNOSIS — I4891 Unspecified atrial fibrillation: Secondary | ICD-10-CM | POA: Diagnosis not present

## 2022-10-10 ENCOUNTER — Other Ambulatory Visit: Payer: Self-pay | Admitting: Cardiology

## 2022-10-12 ENCOUNTER — Telehealth: Payer: Self-pay

## 2022-10-12 DIAGNOSIS — E871 Hypo-osmolality and hyponatremia: Secondary | ICD-10-CM | POA: Diagnosis not present

## 2022-10-12 DIAGNOSIS — I160 Hypertensive urgency: Secondary | ICD-10-CM | POA: Diagnosis not present

## 2022-10-12 DIAGNOSIS — Z7982 Long term (current) use of aspirin: Secondary | ICD-10-CM | POA: Diagnosis not present

## 2022-10-12 DIAGNOSIS — Z87891 Personal history of nicotine dependence: Secondary | ICD-10-CM | POA: Diagnosis not present

## 2022-10-12 DIAGNOSIS — N390 Urinary tract infection, site not specified: Secondary | ICD-10-CM | POA: Diagnosis not present

## 2022-10-12 DIAGNOSIS — Z86711 Personal history of pulmonary embolism: Secondary | ICD-10-CM | POA: Diagnosis not present

## 2022-10-12 DIAGNOSIS — Z7952 Long term (current) use of systemic steroids: Secondary | ICD-10-CM | POA: Diagnosis not present

## 2022-10-12 DIAGNOSIS — D649 Anemia, unspecified: Secondary | ICD-10-CM | POA: Diagnosis not present

## 2022-10-12 DIAGNOSIS — Z9981 Dependence on supplemental oxygen: Secondary | ICD-10-CM | POA: Diagnosis not present

## 2022-10-12 DIAGNOSIS — I10 Essential (primary) hypertension: Secondary | ICD-10-CM | POA: Diagnosis not present

## 2022-10-12 DIAGNOSIS — M1990 Unspecified osteoarthritis, unspecified site: Secondary | ICD-10-CM | POA: Diagnosis not present

## 2022-10-12 DIAGNOSIS — J441 Chronic obstructive pulmonary disease with (acute) exacerbation: Secondary | ICD-10-CM | POA: Diagnosis not present

## 2022-10-12 DIAGNOSIS — Z792 Long term (current) use of antibiotics: Secondary | ICD-10-CM | POA: Diagnosis not present

## 2022-10-12 DIAGNOSIS — I700 Atherosclerosis of aorta: Secondary | ICD-10-CM | POA: Diagnosis not present

## 2022-10-12 DIAGNOSIS — Z7951 Long term (current) use of inhaled steroids: Secondary | ICD-10-CM | POA: Diagnosis not present

## 2022-10-12 DIAGNOSIS — J9621 Acute and chronic respiratory failure with hypoxia: Secondary | ICD-10-CM | POA: Diagnosis not present

## 2022-10-12 DIAGNOSIS — Z556 Problems related to health literacy: Secondary | ICD-10-CM | POA: Diagnosis not present

## 2022-10-12 DIAGNOSIS — I4811 Longstanding persistent atrial fibrillation: Secondary | ICD-10-CM | POA: Diagnosis not present

## 2022-10-12 NOTE — Telephone Encounter (Signed)
[facility] called and patient was recently discharge from the  hospital and a order was sent to them for nursing and PT for home health. They were calling to make sure Tina Jimenez would be the one that they send following orders to. Also wanted patient last visit note sent to them.

## 2022-10-12 NOTE — Telephone Encounter (Signed)
Cecil from Physical therapy [facility] health called and stated that he was calling to request visits with patient 2 a week for 4 weeks and then 1 a week for 3 weeks and would fax over the request order to be signed.

## 2022-10-13 ENCOUNTER — Telehealth: Payer: Self-pay

## 2022-10-13 DIAGNOSIS — J449 Chronic obstructive pulmonary disease, unspecified: Secondary | ICD-10-CM | POA: Diagnosis not present

## 2022-10-13 NOTE — Telephone Encounter (Signed)
Called and informed Tina Jimenez from [facility] health PT that patient is only supposed to be on what the hospital recommended for her oxygen which was the 2L. Also mentioned if she is having worsening SOB patient would need to be evaluated and or seen back at the ER. He understood verbally and would notify the patient.

## 2022-10-13 NOTE — Telephone Encounter (Signed)
[name] called back and stated that he called the patient to inform her that she is still supposed to only be on the 2L and not the 3.5L of oxygen at home and patient stated and told him that No she was going to Continue doing the 3.5L herself because she has been doing that for a long time now and will continue the same. He just wanted to call us back and let us know of her reply.

## 2022-10-13 NOTE — Telephone Encounter (Signed)
Physical Therapist from [facility] called stating that when he went out to see the patient she was using 3.5 L of oxygen and the hospital had only discharged her on 2 liters only. He was just calling to verify that the liters have not changed which the patient is not scheduled to see you for hospital follow up until 10/20/22. Please advise.  [name]

## 2022-10-14 ENCOUNTER — Telehealth: Payer: Self-pay

## 2022-10-14 ENCOUNTER — Telehealth: Payer: Medicare Other

## 2022-10-14 DIAGNOSIS — Z7951 Long term (current) use of inhaled steroids: Secondary | ICD-10-CM | POA: Diagnosis not present

## 2022-10-14 DIAGNOSIS — Z87891 Personal history of nicotine dependence: Secondary | ICD-10-CM | POA: Diagnosis not present

## 2022-10-14 DIAGNOSIS — I4811 Longstanding persistent atrial fibrillation: Secondary | ICD-10-CM | POA: Diagnosis not present

## 2022-10-14 DIAGNOSIS — Z7982 Long term (current) use of aspirin: Secondary | ICD-10-CM | POA: Diagnosis not present

## 2022-10-14 DIAGNOSIS — J441 Chronic obstructive pulmonary disease with (acute) exacerbation: Secondary | ICD-10-CM | POA: Diagnosis not present

## 2022-10-14 DIAGNOSIS — N390 Urinary tract infection, site not specified: Secondary | ICD-10-CM | POA: Diagnosis not present

## 2022-10-14 DIAGNOSIS — I160 Hypertensive urgency: Secondary | ICD-10-CM | POA: Diagnosis not present

## 2022-10-14 DIAGNOSIS — J9621 Acute and chronic respiratory failure with hypoxia: Secondary | ICD-10-CM | POA: Diagnosis not present

## 2022-10-14 DIAGNOSIS — Z9981 Dependence on supplemental oxygen: Secondary | ICD-10-CM | POA: Diagnosis not present

## 2022-10-14 DIAGNOSIS — E871 Hypo-osmolality and hyponatremia: Secondary | ICD-10-CM | POA: Diagnosis not present

## 2022-10-14 DIAGNOSIS — Z86711 Personal history of pulmonary embolism: Secondary | ICD-10-CM | POA: Diagnosis not present

## 2022-10-14 DIAGNOSIS — D649 Anemia, unspecified: Secondary | ICD-10-CM | POA: Diagnosis not present

## 2022-10-14 DIAGNOSIS — Z556 Problems related to health literacy: Secondary | ICD-10-CM | POA: Diagnosis not present

## 2022-10-14 DIAGNOSIS — Z792 Long term (current) use of antibiotics: Secondary | ICD-10-CM | POA: Diagnosis not present

## 2022-10-14 DIAGNOSIS — I10 Essential (primary) hypertension: Secondary | ICD-10-CM | POA: Diagnosis not present

## 2022-10-14 DIAGNOSIS — M1990 Unspecified osteoarthritis, unspecified site: Secondary | ICD-10-CM | POA: Diagnosis not present

## 2022-10-14 DIAGNOSIS — Z7952 Long term (current) use of systemic steroids: Secondary | ICD-10-CM | POA: Diagnosis not present

## 2022-10-14 DIAGNOSIS — I700 Atherosclerosis of aorta: Secondary | ICD-10-CM | POA: Diagnosis not present

## 2022-10-14 NOTE — Telephone Encounter (Signed)
   CCM RN Visit Note   10-14-2022 Name: Tina Jimenez MRN: [id number]      DOB: [birth year]  Subjective: Tina Jimenez is a 82 y.o. year old female who is a primary care patient of @PCP . The patient was referred to the Chronic Care Management team for assistance with care management needs subsequent to provider initiation of CCM services and plan of care.      An unsuccessful telephone outreach was attempted today to contact the patient about Chronic Care Management needs.    Plan:The care management team will reach out to the patient again over the next 30 days.   Alto Denver RN, MSN, CCM RN Care Manager  Chronic Care Management Direct Number: [phone]

## 2022-10-14 NOTE — Telephone Encounter (Signed)
Left message for [name] from [facility] health to give [place] a call back.

## 2022-10-14 NOTE — Telephone Encounter (Signed)
[name] from [facility] Called back to also report that the patient is not also taking Sodium Bicarbonate, which was on her med list when leaving the hospital too as well and she is not taking it. Does the patient need to be taking this? He is also questioning if he can get nursing home health orders for a nurse to come out and organize her medications and give her education on the medications to ensure she is taking them correctly? Is this ok to give them the order?

## 2022-10-14 NOTE — Telephone Encounter (Signed)
Cecil from [facility] health PT was informed and states he will get the order for nursing faxed over to [place] and nursing will be in contact with [place].

## 2022-10-15 ENCOUNTER — Encounter: Payer: Self-pay | Admitting: Internal Medicine

## 2022-10-15 DIAGNOSIS — I4811 Longstanding persistent atrial fibrillation: Secondary | ICD-10-CM | POA: Diagnosis not present

## 2022-10-15 DIAGNOSIS — I10 Essential (primary) hypertension: Secondary | ICD-10-CM | POA: Diagnosis not present

## 2022-10-15 DIAGNOSIS — Z87891 Personal history of nicotine dependence: Secondary | ICD-10-CM | POA: Diagnosis not present

## 2022-10-15 DIAGNOSIS — Z9981 Dependence on supplemental oxygen: Secondary | ICD-10-CM | POA: Diagnosis not present

## 2022-10-15 DIAGNOSIS — Z7982 Long term (current) use of aspirin: Secondary | ICD-10-CM | POA: Diagnosis not present

## 2022-10-15 DIAGNOSIS — Z792 Long term (current) use of antibiotics: Secondary | ICD-10-CM | POA: Diagnosis not present

## 2022-10-15 DIAGNOSIS — I700 Atherosclerosis of aorta: Secondary | ICD-10-CM | POA: Diagnosis not present

## 2022-10-15 DIAGNOSIS — I160 Hypertensive urgency: Secondary | ICD-10-CM | POA: Diagnosis not present

## 2022-10-15 DIAGNOSIS — E871 Hypo-osmolality and hyponatremia: Secondary | ICD-10-CM | POA: Diagnosis not present

## 2022-10-15 DIAGNOSIS — J441 Chronic obstructive pulmonary disease with (acute) exacerbation: Secondary | ICD-10-CM | POA: Diagnosis not present

## 2022-10-15 DIAGNOSIS — Z7952 Long term (current) use of systemic steroids: Secondary | ICD-10-CM | POA: Diagnosis not present

## 2022-10-15 DIAGNOSIS — N390 Urinary tract infection, site not specified: Secondary | ICD-10-CM | POA: Diagnosis not present

## 2022-10-15 DIAGNOSIS — Z556 Problems related to health literacy: Secondary | ICD-10-CM | POA: Diagnosis not present

## 2022-10-15 DIAGNOSIS — Z7951 Long term (current) use of inhaled steroids: Secondary | ICD-10-CM | POA: Diagnosis not present

## 2022-10-15 DIAGNOSIS — J9621 Acute and chronic respiratory failure with hypoxia: Secondary | ICD-10-CM | POA: Diagnosis not present

## 2022-10-15 DIAGNOSIS — D649 Anemia, unspecified: Secondary | ICD-10-CM | POA: Diagnosis not present

## 2022-10-15 DIAGNOSIS — M1990 Unspecified osteoarthritis, unspecified site: Secondary | ICD-10-CM | POA: Diagnosis not present

## 2022-10-15 DIAGNOSIS — Z86711 Personal history of pulmonary embolism: Secondary | ICD-10-CM | POA: Diagnosis not present

## 2022-10-19 DIAGNOSIS — Z7982 Long term (current) use of aspirin: Secondary | ICD-10-CM | POA: Diagnosis not present

## 2022-10-19 DIAGNOSIS — D649 Anemia, unspecified: Secondary | ICD-10-CM | POA: Diagnosis not present

## 2022-10-19 DIAGNOSIS — E871 Hypo-osmolality and hyponatremia: Secondary | ICD-10-CM | POA: Diagnosis not present

## 2022-10-19 DIAGNOSIS — Z7952 Long term (current) use of systemic steroids: Secondary | ICD-10-CM | POA: Diagnosis not present

## 2022-10-19 DIAGNOSIS — Z9981 Dependence on supplemental oxygen: Secondary | ICD-10-CM | POA: Diagnosis not present

## 2022-10-19 DIAGNOSIS — Z87891 Personal history of nicotine dependence: Secondary | ICD-10-CM | POA: Diagnosis not present

## 2022-10-19 DIAGNOSIS — I4811 Longstanding persistent atrial fibrillation: Secondary | ICD-10-CM | POA: Diagnosis not present

## 2022-10-19 DIAGNOSIS — Z86711 Personal history of pulmonary embolism: Secondary | ICD-10-CM | POA: Diagnosis not present

## 2022-10-19 DIAGNOSIS — M1990 Unspecified osteoarthritis, unspecified site: Secondary | ICD-10-CM | POA: Diagnosis not present

## 2022-10-19 DIAGNOSIS — Z7951 Long term (current) use of inhaled steroids: Secondary | ICD-10-CM | POA: Diagnosis not present

## 2022-10-19 DIAGNOSIS — I160 Hypertensive urgency: Secondary | ICD-10-CM | POA: Diagnosis not present

## 2022-10-19 DIAGNOSIS — J9621 Acute and chronic respiratory failure with hypoxia: Secondary | ICD-10-CM | POA: Diagnosis not present

## 2022-10-19 DIAGNOSIS — N390 Urinary tract infection, site not specified: Secondary | ICD-10-CM | POA: Diagnosis not present

## 2022-10-19 DIAGNOSIS — J441 Chronic obstructive pulmonary disease with (acute) exacerbation: Secondary | ICD-10-CM | POA: Diagnosis not present

## 2022-10-19 DIAGNOSIS — I700 Atherosclerosis of aorta: Secondary | ICD-10-CM | POA: Diagnosis not present

## 2022-10-19 DIAGNOSIS — I10 Essential (primary) hypertension: Secondary | ICD-10-CM | POA: Diagnosis not present

## 2022-10-19 DIAGNOSIS — Z792 Long term (current) use of antibiotics: Secondary | ICD-10-CM | POA: Diagnosis not present

## 2022-10-19 DIAGNOSIS — Z556 Problems related to health literacy: Secondary | ICD-10-CM | POA: Diagnosis not present

## 2022-10-20 ENCOUNTER — Ambulatory Visit (INDEPENDENT_AMBULATORY_CARE_PROVIDER_SITE_OTHER): Payer: Medicare Other | Admitting: Physician Assistant

## 2022-10-20 ENCOUNTER — Encounter: Payer: Self-pay | Admitting: Physician Assistant

## 2022-10-20 VITALS — BP 130/72 | HR 76 | Temp 96.6°F | Resp 20 | Ht 63.0 in | Wt 138.0 lb

## 2022-10-20 DIAGNOSIS — D508 Other iron deficiency anemias: Secondary | ICD-10-CM

## 2022-10-20 DIAGNOSIS — J441 Chronic obstructive pulmonary disease with (acute) exacerbation: Secondary | ICD-10-CM

## 2022-10-20 DIAGNOSIS — I10 Essential (primary) hypertension: Secondary | ICD-10-CM

## 2022-10-20 DIAGNOSIS — R7989 Other specified abnormal findings of blood chemistry: Secondary | ICD-10-CM | POA: Diagnosis not present

## 2022-10-20 DIAGNOSIS — I480 Paroxysmal atrial fibrillation: Secondary | ICD-10-CM | POA: Diagnosis not present

## 2022-10-20 DIAGNOSIS — R0902 Hypoxemia: Secondary | ICD-10-CM | POA: Diagnosis not present

## 2022-10-20 DIAGNOSIS — E782 Mixed hyperlipidemia: Secondary | ICD-10-CM

## 2022-10-20 DIAGNOSIS — E559 Vitamin D deficiency, unspecified: Secondary | ICD-10-CM | POA: Diagnosis not present

## 2022-10-20 DIAGNOSIS — R5383 Other fatigue: Secondary | ICD-10-CM | POA: Diagnosis not present

## 2022-10-20 DIAGNOSIS — E871 Hypo-osmolality and hyponatremia: Secondary | ICD-10-CM

## 2022-10-20 DIAGNOSIS — R600 Localized edema: Secondary | ICD-10-CM

## 2022-10-20 DIAGNOSIS — J438 Other emphysema: Secondary | ICD-10-CM | POA: Diagnosis not present

## 2022-10-20 MED ORDER — AMOXICILLIN-POT CLAVULANATE 875-125 MG PO TABS
1.0000 | ORAL_TABLET | Freq: Two times a day (BID) | ORAL | 0 refills | Status: DC
Start: 2022-10-20 — End: 2022-11-12

## 2022-10-20 NOTE — Progress Notes (Signed)
Subjective:  Patient ID: Tina Jimenez, female    DOB: [birth year]  Age: 82 y.o. MRN: [id number]  Chief Complaint  Patient presents with   COPD    HPI Pt in today for hospital follow up.  She was admitted to [facility] 4/23-4/27.  She refused to be placed in rehab therapy after hospital stay.    Pt was diagnosed with acute and chronic respiratory failure and COPD exacerbation. She did have chest xray which showed emphysematous changes of the upper lobes but no other acute cardiopulmonary process.  Patient states however since being discharged her cough is worse and is now productive.  She was given 3 doses of zithromax and a few days of omnicef (that was given for uti) Pt states she is using breo and her nebulizer as directed.  She does use oxygen and is supposed to be at 2L but she increases on her own up to 3L when 'she is up and moving around' Pt refuses pulmonology referral  Pt with longstanding history of afib and currently on digoxin.  While in hospital it was noted she had a high heart rate but it did convert back to under control .  Her blood pressure was significantly elevated as well.  She had been on carvedilol but was changed to lopressor 50mg  po bid.  She states she is tolerating medication well.  BP today good at 130/72  Pt was diagnosed with UTI and states she feels better - is not able to get ua sample today  Pt was diagnosed with 'chronic hyponatremia' and given sodium supplements to take daily.  She does not have prior history of that and all her recent labwork in our office she has not been hyponatremic. She states since starting the sodium she has started having swelling in her feet/ankles. She would like to stop the medication Upon reviewing cardiac history she was noted to have elevated BNP in the fall done by cardiology but was not treated with any new medications at that time and she has not had an echocardiogram since 2021.  She is due to follow up with  cardiology and will get that scheduled      10/20/2022   10:07 AM 06/17/2022    2:49 PM 02/19/2022    1:46 PM 09/15/2021    9:52 AM 05/21/2020    2:13 PM  Depression screen PHQ 2/9  Decreased Interest 2 0 0 0 0  Down, Depressed, Hopeless 0 0 0 0 0  PHQ - 2 Score 2 0 0 0 0  Altered sleeping 0      Tired, decreased energy 0      Change in appetite 0      Feeling bad or failure about yourself  0      Trouble concentrating 2      Moving slowly or fidgety/restless 0      Suicidal thoughts 0      PHQ-9 Score 4      Difficult doing work/chores Not difficult at all            10/16/2019    8:15 AM 09/15/2021    9:52 AM 02/19/2022    1:47 PM 06/17/2022    2:38 PM 10/20/2022   10:07 AM  Fall Risk  Falls in the past year?  0 0 0 Exclusion - non ambulatory  Was there an injury with Fall?  0 0 0 0  Fall Risk Category Calculator  0 0 0 0  Fall Risk  Category (Retired)  Low Low Low   (RETIRED) Patient Fall Risk Level Moderate fall risk Low fall risk Low fall risk Low fall risk   Patient at Risk for Falls Due to   No Fall Risks;Impaired balance/gait Impaired balance/gait;Impaired mobility Impaired balance/gait;Impaired mobility  Fall risk Follow up   Falls evaluation completed;Education provided;Falls prevention discussed Falls evaluation completed;Education provided      ROS CONSTITUTIONAL: has had some general malaise/fatigue E/N/T: Negative for ear pain, nasal congestion and sore throat.  CARDIOVASCULAR: Negative for chest pain, dizziness, palpitations- has had lower extremity swelling RESPIRATORY: see HPI GASTROINTESTINAL: Negative for abdominal pain, acid reflux symptoms, constipation, diarrhea, nausea and vomiting.  GU - negative MSK: Negative for arthralgias and myalgias.  INTEGUMENTARY: Negative for rash.     Current Outpatient Medications:    amoxicillin-clavulanate (AUGMENTIN) 875-125 MG tablet, Take 1 tablet by mouth 2 (two) times daily., Disp: 20 tablet, Rfl: 0   metoprolol tartrate  (LOPRESSOR) 50 MG tablet, Take 50 mg by mouth 2 (two) times daily., Disp: , Rfl:    sodium chloride 1 g tablet, Take 1 g by mouth daily., Disp: , Rfl:    Ascorbic Acid (VITAMIN C) 500 MG CAPS, Take 1 tablet by mouth daily., Disp: , Rfl:    aspirin EC 81 MG tablet, Take 81 mg by mouth every other day. Swallow whole., Disp: , Rfl:    aspirin-acetaminophen-caffeine (EXCEDRIN MIGRAINE) 250-250-65 MG tablet, Take 1-2 tablets by mouth every 6 (six) hours as needed for headache., Disp: 30 tablet, Rfl: 2   Bempedoic Acid-Ezetimibe (NEXLIZET) 180-10 MG TABS, Take 1 tablet by mouth daily in the afternoon., Disp: 90 tablet, Rfl: 1   Co-Enzyme Q-10 100 MG CAPS, Take 100 mg by mouth daily., Disp: , Rfl:    digoxin (LANOXIN) 0.125 MG tablet, Take 1 tablet (125 mcg total) by mouth daily., Disp: 90 tablet, Rfl: 1   DOCUSATE SODIUM PO, Take 200 mg by mouth daily., Disp: , Rfl:    famotidine (PEPCID) 20 MG tablet, Take 20 mg by mouth daily., Disp: , Rfl:    ferrous sulfate 325 (65 FE) MG tablet, 1 po qd with breakfast, Disp: 90 tablet, Rfl: 1   fluconazole (DIFLUCAN) 150 MG tablet, 1 po then repeat 3 days later, Disp: 2 tablet, Rfl: 0   fluticasone (FLONASE) 50 MCG/ACT nasal spray, SPRAY 2 SPRAYS INTO EACH NOSTRIL EVERY DAY, Disp: 48 mL, Rfl: 2   fluticasone furoate-vilanterol (BREO ELLIPTA) 200-25 MCG/ACT AEPB, Inhale 1 puff into the lungs daily., Disp: 1 each, Rfl: 11   ipratropium-albuterol (DUONEB) 0.5-2.5 (3) MG/3ML SOLN, USE 1 NEBULE IN NEBULIZER EVERY MORNING, NOON, EVENING, AND BEDTIME., Disp: 1080 mL, Rfl: 1   LACTOBACILLUS PO, Take 1 tablet by mouth daily., Disp: , Rfl:    loratadine (CLARITIN) 10 MG tablet, TAKE 1 TABLET BY MOUTH ONCE DAILY FOR ALLERGIES, Disp: 90 tablet, Rfl: 1   omeprazole (PRILOSEC) 20 MG capsule, Take 1 capsule (20 mg total) by mouth daily., Disp: 90 capsule, Rfl: 1   triamcinolone cream (KENALOG) 0.1 %, APPLY TO AFFECTED AREA TWICE A DAY, Disp: 30 g, Rfl: 0  Past Medical History:   Diagnosis Date   Abdominal pain 10/13/2019   Abnormal blood chemistry 08/22/2019   Acute blood loss anemia 06/22/2019   Acute bronchitis with COPD (HCC) 08/15/2019   Acute cystitis with hematuria 10/26/2019   Acute embolism and thrombosis of deep veins of left upper extremity (HCC) 07/08/2019   Acute hypoxemic respiratory failure (HCC) 07/08/2019  Acute pain of left shoulder 10/26/2019   Acute posthemorrhagic anemia 07/08/2019   Acute pulmonary embolism without acute cor pulmonale (HCC) 06/21/2019   Adnexal mass    Anemia 01/08/2020   Arm DVT (deep venous thromboembolism), acute, left (HCC) 06/26/2019   Atrial fibrillation (HCC)    Atypical chest pain 03/04/2022   Bladder mass 10/26/2019   Compression fracture of body of thoracic vertebra (HCC) 2020   COPD (chronic obstructive pulmonary disease) (HCC)    COPD without exacerbation (HCC) 08/15/2019   Coronary artery calcification seen on CT scan 05/23/2020   COVID-19 07/08/2019   Dependence on supplemental oxygen 04/14/2020   Effusion, left ankle 08/03/2019   Effusion, left foot 08/03/2019   Effusion, right ankle 08/03/2019   Effusion, right foot 08/03/2019   Elevated white blood cell count, unspecified 07/11/2019   Encounter for other orthopedic aftercare 07/08/2019   Essential (primary) hypertension 07/08/2019   Gastro-esophageal reflux disease without esophagitis 07/08/2019   History of pulmonary embolism 10/02/2019   Hyperlipemia    Hypertension 08/15/2019   Localized edema 08/22/2019   Mixed dyslipidemia 05/23/2020   Muscle weakness (generalized) 07/08/2019   Osteoporosis    Other disorders of electrolyte and fluid balance, not elsewhere classified 08/10/2019   Other emphysema (HCC) 03/04/2022   Other fatigue 01/08/2020   Oxygen deficiency    Paroxysmal atrial fibrillation (HCC) 06/20/2019   Persistent atrial fibrillation (HCC) 08/22/2019   Pneumonia 05/2019   COVID Pneumonia   Pneumonia due to COVID-19 virus  07/08/2019   Precordial pain 06/20/2019   Pulmonary embolism (HCC) 05/2019   Spinal stenosis, lumbar region without neurogenic claudication 06/14/2019   Unsteadiness on feet 07/08/2019   Vitamin D deficiency 06/22/2019   Wedge compression fracture of unspecified lumbar vertebra, subsequent encounter for fracture with routine healing 07/08/2019   Objective:  PHYSICAL EXAM:   BP 130/72 (BP Location: Left Arm, Patient Position: Sitting, Cuff Size: Normal)   Pulse 76   Temp (!) 96.6 F (35.9 C) (Temporal)   Resp 20   Ht 5\' 3"  (1.6 m)   Wt 138 lb (62.6 kg)   SpO2 100% Comment: 3L  BMI 24.45 kg/m    GEN: Well nourished, well developed, in no acute distress - in wheelchair accompanied by son Cardiac: RRR; no murmurs, rubs, or gallops, 1+nonpitting edema of feet/ankles Respiratory:  scattered rhonchi throughout both lung fields Skin: warm and dry, no rash  Neuro:  Alert and Oriented x 3,  - CN II-Xii grossly intact Psych: euthymic mood, appropriate affect and demeanor  Assessment & Plan:    Primary hypertension -     CBC with Differential/Platelet -     Comprehensive metabolic panel -     TSH Continue current meds Mixed dyslipidemia -     Lipid panel  Vitamin D deficiency -     VITAMIN D 25 Hydroxy (Vit-D Deficiency, Fractures)  Other emphysema (HCC) Continue current meds Paroxysmal atrial fibrillation (HCC) Continue meds Pt to reschedule missed cardiology appt Oxygen deficiency Continue 2L continuous oxygen Other fatigue -     CBC with Differential/Platelet -     Comprehensive metabolic panel -     TSH -     Magnesium -     Iron, TIBC and Ferritin Panel -     B12 and Folate Panel  Hyponatremia -     Comprehensive metabolic panel  Other iron deficiency anemia -     Iron, TIBC and Ferritin Panel  Localized edema -     Pro  b natriuretic peptide (BNP)  Elevated brain natriuretic peptide (BNP) level -     Pro b natriuretic peptide (BNP)  COPD exacerbation  (HCC) -     Amoxicillin-Pot Clavulanate; Take 1 tablet by mouth 2 (two) times daily.  Dispense: 20 tablet; Refill: 0     Follow-up: Return in about 3 weeks (around 11/10/2022) for follow-up.  An After Visit Summary was printed and given to the patient.  Jettie Pagan [facility] [phone]

## 2022-10-21 ENCOUNTER — Other Ambulatory Visit: Payer: Self-pay | Admitting: Physician Assistant

## 2022-10-21 DIAGNOSIS — Z9981 Dependence on supplemental oxygen: Secondary | ICD-10-CM | POA: Diagnosis not present

## 2022-10-21 DIAGNOSIS — Z556 Problems related to health literacy: Secondary | ICD-10-CM | POA: Diagnosis not present

## 2022-10-21 DIAGNOSIS — Z7951 Long term (current) use of inhaled steroids: Secondary | ICD-10-CM | POA: Diagnosis not present

## 2022-10-21 DIAGNOSIS — M1990 Unspecified osteoarthritis, unspecified site: Secondary | ICD-10-CM | POA: Diagnosis not present

## 2022-10-21 DIAGNOSIS — D649 Anemia, unspecified: Secondary | ICD-10-CM | POA: Diagnosis not present

## 2022-10-21 DIAGNOSIS — E871 Hypo-osmolality and hyponatremia: Secondary | ICD-10-CM | POA: Diagnosis not present

## 2022-10-21 DIAGNOSIS — I160 Hypertensive urgency: Secondary | ICD-10-CM | POA: Diagnosis not present

## 2022-10-21 DIAGNOSIS — I4811 Longstanding persistent atrial fibrillation: Secondary | ICD-10-CM | POA: Diagnosis not present

## 2022-10-21 DIAGNOSIS — I700 Atherosclerosis of aorta: Secondary | ICD-10-CM | POA: Diagnosis not present

## 2022-10-21 DIAGNOSIS — Z87891 Personal history of nicotine dependence: Secondary | ICD-10-CM | POA: Diagnosis not present

## 2022-10-21 DIAGNOSIS — Z7952 Long term (current) use of systemic steroids: Secondary | ICD-10-CM | POA: Diagnosis not present

## 2022-10-21 DIAGNOSIS — J441 Chronic obstructive pulmonary disease with (acute) exacerbation: Secondary | ICD-10-CM | POA: Diagnosis not present

## 2022-10-21 DIAGNOSIS — Z7982 Long term (current) use of aspirin: Secondary | ICD-10-CM | POA: Diagnosis not present

## 2022-10-21 DIAGNOSIS — Z86711 Personal history of pulmonary embolism: Secondary | ICD-10-CM | POA: Diagnosis not present

## 2022-10-21 DIAGNOSIS — J9621 Acute and chronic respiratory failure with hypoxia: Secondary | ICD-10-CM | POA: Diagnosis not present

## 2022-10-21 DIAGNOSIS — I10 Essential (primary) hypertension: Secondary | ICD-10-CM | POA: Diagnosis not present

## 2022-10-21 DIAGNOSIS — R600 Localized edema: Secondary | ICD-10-CM

## 2022-10-21 DIAGNOSIS — N390 Urinary tract infection, site not specified: Secondary | ICD-10-CM | POA: Diagnosis not present

## 2022-10-21 DIAGNOSIS — Z792 Long term (current) use of antibiotics: Secondary | ICD-10-CM | POA: Diagnosis not present

## 2022-10-21 LAB — CBC WITH DIFFERENTIAL/PLATELET
Basophils Absolute: 0.1 10*3/uL (ref 0.0–0.2)
Basos: 0 %
EOS (ABSOLUTE): 0.3 10*3/uL (ref 0.0–0.4)
Eos: 2 %
Hematocrit: 37.1 % (ref 34.0–46.6)
Hemoglobin: 11.9 g/dL (ref 11.1–15.9)
Immature Grans (Abs): 0.1 10*3/uL (ref 0.0–0.1)
Immature Granulocytes: 1 %
Lymphocytes Absolute: 1.1 10*3/uL (ref 0.7–3.1)
Lymphs: 7 %
MCH: 29.8 pg (ref 26.6–33.0)
MCHC: 32.1 g/dL (ref 31.5–35.7)
MCV: 93 fL (ref 79–97)
Monocytes Absolute: 1.1 10*3/uL — ABNORMAL HIGH (ref 0.1–0.9)
Monocytes: 8 %
Neutrophils Absolute: 12.5 10*3/uL — ABNORMAL HIGH (ref 1.4–7.0)
Neutrophils: 82 %
Platelets: 274 10*3/uL (ref 150–450)
RBC: 3.99 x10E6/uL (ref 3.77–5.28)
RDW: 12 % (ref 11.7–15.4)
WBC: 15.1 10*3/uL — ABNORMAL HIGH (ref 3.4–10.8)

## 2022-10-21 LAB — PRO B NATRIURETIC PEPTIDE: NT-Pro BNP: 5327 pg/mL — ABNORMAL HIGH (ref 0–738)

## 2022-10-21 LAB — LIPID PANEL
Chol/HDL Ratio: 2.3 ratio (ref 0.0–4.4)
Cholesterol, Total: 132 mg/dL (ref 100–199)
HDL: 58 mg/dL (ref >39)
LDL Chol Calc (NIH): 57 mg/dL (ref 0–99)
Triglycerides: 92 mg/dL (ref 0–149)
VLDL Cholesterol Cal: 17 mg/dL (ref 5–40)

## 2022-10-21 LAB — COMPREHENSIVE METABOLIC PANEL
ALT: 20 IU/L (ref 0–32)
AST: 22 IU/L (ref 0–40)
Albumin/Globulin Ratio: 2.2 (ref 1.2–2.2)
Albumin: 4 g/dL (ref 3.8–4.8)
Alkaline Phosphatase: 65 IU/L (ref 44–121)
BUN/Creatinine Ratio: 23 (ref 12–28)
BUN: 11 mg/dL (ref 8–27)
Bilirubin Total: 0.6 mg/dL (ref 0.0–1.2)
CO2: 36 mmol/L — ABNORMAL HIGH (ref 20–29)
Calcium: 9.2 mg/dL (ref 8.7–10.3)
Chloride: 93 mmol/L — ABNORMAL LOW (ref 96–106)
Creatinine, Ser: 0.47 mg/dL — ABNORMAL LOW (ref 0.57–1.00)
Globulin, Total: 1.8 g/dL (ref 1.5–4.5)
Glucose: 78 mg/dL (ref 70–99)
Potassium: 5.3 mmol/L — ABNORMAL HIGH (ref 3.5–5.2)
Sodium: 139 mmol/L (ref 134–144)
Total Protein: 5.8 g/dL — ABNORMAL LOW (ref 6.0–8.5)
eGFR: 96 mL/min/{1.73_m2} (ref >59)

## 2022-10-21 LAB — CARDIOVASCULAR RISK ASSESSMENT

## 2022-10-21 LAB — TSH: TSH: 1.52 u[IU]/mL (ref 0.450–4.500)

## 2022-10-21 LAB — B12 AND FOLATE PANEL
Folate: >20 ng/mL (ref >3.0)
Vitamin B-12: 755 pg/mL (ref 232–1245)

## 2022-10-21 MED ORDER — FUROSEMIDE 20 MG PO TABS
20.0000 mg | ORAL_TABLET | Freq: Every day | ORAL | 1 refills | Status: DC
Start: 2022-10-21 — End: 2022-10-23

## 2022-10-22 ENCOUNTER — Telehealth: Payer: Self-pay | Admitting: Physician Assistant

## 2022-10-22 DIAGNOSIS — J441 Chronic obstructive pulmonary disease with (acute) exacerbation: Secondary | ICD-10-CM | POA: Diagnosis not present

## 2022-10-22 DIAGNOSIS — Z7982 Long term (current) use of aspirin: Secondary | ICD-10-CM | POA: Diagnosis not present

## 2022-10-22 DIAGNOSIS — Z7951 Long term (current) use of inhaled steroids: Secondary | ICD-10-CM | POA: Diagnosis not present

## 2022-10-22 DIAGNOSIS — E871 Hypo-osmolality and hyponatremia: Secondary | ICD-10-CM | POA: Diagnosis not present

## 2022-10-22 DIAGNOSIS — Z86711 Personal history of pulmonary embolism: Secondary | ICD-10-CM | POA: Diagnosis not present

## 2022-10-22 DIAGNOSIS — Z7952 Long term (current) use of systemic steroids: Secondary | ICD-10-CM | POA: Diagnosis not present

## 2022-10-22 DIAGNOSIS — Z792 Long term (current) use of antibiotics: Secondary | ICD-10-CM | POA: Diagnosis not present

## 2022-10-22 DIAGNOSIS — I160 Hypertensive urgency: Secondary | ICD-10-CM | POA: Diagnosis not present

## 2022-10-22 DIAGNOSIS — D649 Anemia, unspecified: Secondary | ICD-10-CM | POA: Diagnosis not present

## 2022-10-22 DIAGNOSIS — J9621 Acute and chronic respiratory failure with hypoxia: Secondary | ICD-10-CM | POA: Diagnosis not present

## 2022-10-22 DIAGNOSIS — M1990 Unspecified osteoarthritis, unspecified site: Secondary | ICD-10-CM | POA: Diagnosis not present

## 2022-10-22 DIAGNOSIS — Z556 Problems related to health literacy: Secondary | ICD-10-CM | POA: Diagnosis not present

## 2022-10-22 DIAGNOSIS — N390 Urinary tract infection, site not specified: Secondary | ICD-10-CM | POA: Diagnosis not present

## 2022-10-22 DIAGNOSIS — I4811 Longstanding persistent atrial fibrillation: Secondary | ICD-10-CM | POA: Diagnosis not present

## 2022-10-22 DIAGNOSIS — Z9981 Dependence on supplemental oxygen: Secondary | ICD-10-CM | POA: Diagnosis not present

## 2022-10-22 DIAGNOSIS — I10 Essential (primary) hypertension: Secondary | ICD-10-CM | POA: Diagnosis not present

## 2022-10-22 DIAGNOSIS — I700 Atherosclerosis of aorta: Secondary | ICD-10-CM | POA: Diagnosis not present

## 2022-10-22 DIAGNOSIS — Z87891 Personal history of nicotine dependence: Secondary | ICD-10-CM | POA: Diagnosis not present

## 2022-10-22 NOTE — Telephone Encounter (Signed)
[facility] ORDERS POC 10-12-22 TO 12-10-22

## 2022-10-23 ENCOUNTER — Other Ambulatory Visit: Payer: Self-pay

## 2022-10-23 DIAGNOSIS — R600 Localized edema: Secondary | ICD-10-CM

## 2022-10-23 MED ORDER — FUROSEMIDE 20 MG PO TABS
20.0000 mg | ORAL_TABLET | Freq: Every day | ORAL | 1 refills | Status: DC
Start: 2022-10-23 — End: 2023-02-16

## 2022-10-23 MED ORDER — METOPROLOL TARTRATE 50 MG PO TABS: 50.0000 mg | ORAL_TABLET | Freq: Two times a day (BID) | ORAL | 0 refills | Status: DC

## 2022-10-27 ENCOUNTER — Telehealth: Payer: Self-pay

## 2022-10-27 DIAGNOSIS — Z7982 Long term (current) use of aspirin: Secondary | ICD-10-CM | POA: Diagnosis not present

## 2022-10-27 DIAGNOSIS — J9621 Acute and chronic respiratory failure with hypoxia: Secondary | ICD-10-CM | POA: Diagnosis not present

## 2022-10-27 DIAGNOSIS — J441 Chronic obstructive pulmonary disease with (acute) exacerbation: Secondary | ICD-10-CM | POA: Diagnosis not present

## 2022-10-27 DIAGNOSIS — Z9981 Dependence on supplemental oxygen: Secondary | ICD-10-CM | POA: Diagnosis not present

## 2022-10-27 DIAGNOSIS — Z86711 Personal history of pulmonary embolism: Secondary | ICD-10-CM | POA: Diagnosis not present

## 2022-10-27 DIAGNOSIS — N390 Urinary tract infection, site not specified: Secondary | ICD-10-CM | POA: Diagnosis not present

## 2022-10-27 DIAGNOSIS — I700 Atherosclerosis of aorta: Secondary | ICD-10-CM | POA: Diagnosis not present

## 2022-10-27 DIAGNOSIS — M1990 Unspecified osteoarthritis, unspecified site: Secondary | ICD-10-CM | POA: Diagnosis not present

## 2022-10-27 DIAGNOSIS — I160 Hypertensive urgency: Secondary | ICD-10-CM | POA: Diagnosis not present

## 2022-10-27 DIAGNOSIS — Z792 Long term (current) use of antibiotics: Secondary | ICD-10-CM | POA: Diagnosis not present

## 2022-10-27 DIAGNOSIS — E871 Hypo-osmolality and hyponatremia: Secondary | ICD-10-CM | POA: Diagnosis not present

## 2022-10-27 DIAGNOSIS — I10 Essential (primary) hypertension: Secondary | ICD-10-CM | POA: Diagnosis not present

## 2022-10-27 DIAGNOSIS — Z7952 Long term (current) use of systemic steroids: Secondary | ICD-10-CM | POA: Diagnosis not present

## 2022-10-27 DIAGNOSIS — I4811 Longstanding persistent atrial fibrillation: Secondary | ICD-10-CM | POA: Diagnosis not present

## 2022-10-27 DIAGNOSIS — Z556 Problems related to health literacy: Secondary | ICD-10-CM | POA: Diagnosis not present

## 2022-10-27 DIAGNOSIS — Z7951 Long term (current) use of inhaled steroids: Secondary | ICD-10-CM | POA: Diagnosis not present

## 2022-10-27 DIAGNOSIS — Z87891 Personal history of nicotine dependence: Secondary | ICD-10-CM | POA: Diagnosis not present

## 2022-10-27 DIAGNOSIS — D649 Anemia, unspecified: Secondary | ICD-10-CM | POA: Diagnosis not present

## 2022-10-27 NOTE — Telephone Encounter (Signed)
[name] HEALTH HOME HEALTH SUPPLEMENTAL ORDERS FROM 10/21/2022 TO 10/21/2022

## 2022-10-28 DIAGNOSIS — I160 Hypertensive urgency: Secondary | ICD-10-CM | POA: Diagnosis not present

## 2022-10-28 DIAGNOSIS — Z87891 Personal history of nicotine dependence: Secondary | ICD-10-CM | POA: Diagnosis not present

## 2022-10-28 DIAGNOSIS — Z792 Long term (current) use of antibiotics: Secondary | ICD-10-CM | POA: Diagnosis not present

## 2022-10-28 DIAGNOSIS — Z7952 Long term (current) use of systemic steroids: Secondary | ICD-10-CM | POA: Diagnosis not present

## 2022-10-28 DIAGNOSIS — Z556 Problems related to health literacy: Secondary | ICD-10-CM | POA: Diagnosis not present

## 2022-10-28 DIAGNOSIS — N390 Urinary tract infection, site not specified: Secondary | ICD-10-CM | POA: Diagnosis not present

## 2022-10-28 DIAGNOSIS — I4811 Longstanding persistent atrial fibrillation: Secondary | ICD-10-CM | POA: Diagnosis not present

## 2022-10-28 DIAGNOSIS — E871 Hypo-osmolality and hyponatremia: Secondary | ICD-10-CM | POA: Diagnosis not present

## 2022-10-28 DIAGNOSIS — Z7982 Long term (current) use of aspirin: Secondary | ICD-10-CM | POA: Diagnosis not present

## 2022-10-28 DIAGNOSIS — I10 Essential (primary) hypertension: Secondary | ICD-10-CM | POA: Diagnosis not present

## 2022-10-28 DIAGNOSIS — Z9981 Dependence on supplemental oxygen: Secondary | ICD-10-CM | POA: Diagnosis not present

## 2022-10-28 DIAGNOSIS — D649 Anemia, unspecified: Secondary | ICD-10-CM | POA: Diagnosis not present

## 2022-10-28 DIAGNOSIS — J9621 Acute and chronic respiratory failure with hypoxia: Secondary | ICD-10-CM | POA: Diagnosis not present

## 2022-10-28 DIAGNOSIS — Z86711 Personal history of pulmonary embolism: Secondary | ICD-10-CM | POA: Diagnosis not present

## 2022-10-28 DIAGNOSIS — M1990 Unspecified osteoarthritis, unspecified site: Secondary | ICD-10-CM | POA: Diagnosis not present

## 2022-10-28 DIAGNOSIS — Z7951 Long term (current) use of inhaled steroids: Secondary | ICD-10-CM | POA: Diagnosis not present

## 2022-10-28 DIAGNOSIS — I700 Atherosclerosis of aorta: Secondary | ICD-10-CM | POA: Diagnosis not present

## 2022-10-28 DIAGNOSIS — J441 Chronic obstructive pulmonary disease with (acute) exacerbation: Secondary | ICD-10-CM | POA: Diagnosis not present

## 2022-10-29 DIAGNOSIS — E871 Hypo-osmolality and hyponatremia: Secondary | ICD-10-CM | POA: Diagnosis not present

## 2022-10-29 DIAGNOSIS — Z7982 Long term (current) use of aspirin: Secondary | ICD-10-CM | POA: Diagnosis not present

## 2022-10-29 DIAGNOSIS — I160 Hypertensive urgency: Secondary | ICD-10-CM | POA: Diagnosis not present

## 2022-10-29 DIAGNOSIS — Z7951 Long term (current) use of inhaled steroids: Secondary | ICD-10-CM | POA: Diagnosis not present

## 2022-10-29 DIAGNOSIS — D649 Anemia, unspecified: Secondary | ICD-10-CM | POA: Diagnosis not present

## 2022-10-29 DIAGNOSIS — I4811 Longstanding persistent atrial fibrillation: Secondary | ICD-10-CM | POA: Diagnosis not present

## 2022-10-29 DIAGNOSIS — Z87891 Personal history of nicotine dependence: Secondary | ICD-10-CM | POA: Diagnosis not present

## 2022-10-29 DIAGNOSIS — Z7952 Long term (current) use of systemic steroids: Secondary | ICD-10-CM | POA: Diagnosis not present

## 2022-10-29 DIAGNOSIS — J441 Chronic obstructive pulmonary disease with (acute) exacerbation: Secondary | ICD-10-CM | POA: Diagnosis not present

## 2022-10-29 DIAGNOSIS — I700 Atherosclerosis of aorta: Secondary | ICD-10-CM | POA: Diagnosis not present

## 2022-10-29 DIAGNOSIS — Z86711 Personal history of pulmonary embolism: Secondary | ICD-10-CM | POA: Diagnosis not present

## 2022-10-29 DIAGNOSIS — J9621 Acute and chronic respiratory failure with hypoxia: Secondary | ICD-10-CM | POA: Diagnosis not present

## 2022-10-29 DIAGNOSIS — N390 Urinary tract infection, site not specified: Secondary | ICD-10-CM | POA: Diagnosis not present

## 2022-10-29 DIAGNOSIS — M1990 Unspecified osteoarthritis, unspecified site: Secondary | ICD-10-CM | POA: Diagnosis not present

## 2022-10-29 DIAGNOSIS — Z792 Long term (current) use of antibiotics: Secondary | ICD-10-CM | POA: Diagnosis not present

## 2022-10-29 DIAGNOSIS — I10 Essential (primary) hypertension: Secondary | ICD-10-CM | POA: Diagnosis not present

## 2022-10-29 DIAGNOSIS — Z556 Problems related to health literacy: Secondary | ICD-10-CM | POA: Diagnosis not present

## 2022-10-29 DIAGNOSIS — Z9981 Dependence on supplemental oxygen: Secondary | ICD-10-CM | POA: Diagnosis not present

## 2022-11-03 DIAGNOSIS — I700 Atherosclerosis of aorta: Secondary | ICD-10-CM | POA: Diagnosis not present

## 2022-11-03 DIAGNOSIS — E871 Hypo-osmolality and hyponatremia: Secondary | ICD-10-CM | POA: Diagnosis not present

## 2022-11-03 DIAGNOSIS — Z7952 Long term (current) use of systemic steroids: Secondary | ICD-10-CM | POA: Diagnosis not present

## 2022-11-03 DIAGNOSIS — Z86711 Personal history of pulmonary embolism: Secondary | ICD-10-CM | POA: Diagnosis not present

## 2022-11-03 DIAGNOSIS — N390 Urinary tract infection, site not specified: Secondary | ICD-10-CM | POA: Diagnosis not present

## 2022-11-03 DIAGNOSIS — J9621 Acute and chronic respiratory failure with hypoxia: Secondary | ICD-10-CM | POA: Diagnosis not present

## 2022-11-03 DIAGNOSIS — Z556 Problems related to health literacy: Secondary | ICD-10-CM | POA: Diagnosis not present

## 2022-11-03 DIAGNOSIS — I160 Hypertensive urgency: Secondary | ICD-10-CM | POA: Diagnosis not present

## 2022-11-03 DIAGNOSIS — Z7951 Long term (current) use of inhaled steroids: Secondary | ICD-10-CM | POA: Diagnosis not present

## 2022-11-03 DIAGNOSIS — Z792 Long term (current) use of antibiotics: Secondary | ICD-10-CM | POA: Diagnosis not present

## 2022-11-03 DIAGNOSIS — M1990 Unspecified osteoarthritis, unspecified site: Secondary | ICD-10-CM | POA: Diagnosis not present

## 2022-11-03 DIAGNOSIS — I10 Essential (primary) hypertension: Secondary | ICD-10-CM | POA: Diagnosis not present

## 2022-11-03 DIAGNOSIS — J441 Chronic obstructive pulmonary disease with (acute) exacerbation: Secondary | ICD-10-CM | POA: Diagnosis not present

## 2022-11-03 DIAGNOSIS — Z7982 Long term (current) use of aspirin: Secondary | ICD-10-CM | POA: Diagnosis not present

## 2022-11-03 DIAGNOSIS — Z87891 Personal history of nicotine dependence: Secondary | ICD-10-CM | POA: Diagnosis not present

## 2022-11-03 DIAGNOSIS — Z9981 Dependence on supplemental oxygen: Secondary | ICD-10-CM | POA: Diagnosis not present

## 2022-11-03 DIAGNOSIS — I4811 Longstanding persistent atrial fibrillation: Secondary | ICD-10-CM | POA: Diagnosis not present

## 2022-11-03 DIAGNOSIS — D649 Anemia, unspecified: Secondary | ICD-10-CM | POA: Diagnosis not present

## 2022-11-04 DIAGNOSIS — D649 Anemia, unspecified: Secondary | ICD-10-CM | POA: Diagnosis not present

## 2022-11-04 DIAGNOSIS — Z792 Long term (current) use of antibiotics: Secondary | ICD-10-CM | POA: Diagnosis not present

## 2022-11-04 DIAGNOSIS — I4811 Longstanding persistent atrial fibrillation: Secondary | ICD-10-CM | POA: Diagnosis not present

## 2022-11-04 DIAGNOSIS — I700 Atherosclerosis of aorta: Secondary | ICD-10-CM | POA: Diagnosis not present

## 2022-11-04 DIAGNOSIS — Z556 Problems related to health literacy: Secondary | ICD-10-CM | POA: Diagnosis not present

## 2022-11-04 DIAGNOSIS — Z7982 Long term (current) use of aspirin: Secondary | ICD-10-CM | POA: Diagnosis not present

## 2022-11-04 DIAGNOSIS — Z7952 Long term (current) use of systemic steroids: Secondary | ICD-10-CM | POA: Diagnosis not present

## 2022-11-04 DIAGNOSIS — E871 Hypo-osmolality and hyponatremia: Secondary | ICD-10-CM | POA: Diagnosis not present

## 2022-11-04 DIAGNOSIS — J9621 Acute and chronic respiratory failure with hypoxia: Secondary | ICD-10-CM | POA: Diagnosis not present

## 2022-11-04 DIAGNOSIS — Z9981 Dependence on supplemental oxygen: Secondary | ICD-10-CM | POA: Diagnosis not present

## 2022-11-04 DIAGNOSIS — J441 Chronic obstructive pulmonary disease with (acute) exacerbation: Secondary | ICD-10-CM | POA: Diagnosis not present

## 2022-11-04 DIAGNOSIS — M1990 Unspecified osteoarthritis, unspecified site: Secondary | ICD-10-CM | POA: Diagnosis not present

## 2022-11-04 DIAGNOSIS — N390 Urinary tract infection, site not specified: Secondary | ICD-10-CM | POA: Diagnosis not present

## 2022-11-04 DIAGNOSIS — Z86711 Personal history of pulmonary embolism: Secondary | ICD-10-CM | POA: Diagnosis not present

## 2022-11-04 DIAGNOSIS — I10 Essential (primary) hypertension: Secondary | ICD-10-CM | POA: Diagnosis not present

## 2022-11-04 DIAGNOSIS — I160 Hypertensive urgency: Secondary | ICD-10-CM | POA: Diagnosis not present

## 2022-11-04 DIAGNOSIS — Z7951 Long term (current) use of inhaled steroids: Secondary | ICD-10-CM | POA: Diagnosis not present

## 2022-11-04 DIAGNOSIS — Z87891 Personal history of nicotine dependence: Secondary | ICD-10-CM | POA: Diagnosis not present

## 2022-11-05 ENCOUNTER — Telehealth: Payer: Self-pay | Admitting: Physician Assistant

## 2022-11-05 DIAGNOSIS — Z7951 Long term (current) use of inhaled steroids: Secondary | ICD-10-CM | POA: Diagnosis not present

## 2022-11-05 DIAGNOSIS — Z7952 Long term (current) use of systemic steroids: Secondary | ICD-10-CM | POA: Diagnosis not present

## 2022-11-05 DIAGNOSIS — J441 Chronic obstructive pulmonary disease with (acute) exacerbation: Secondary | ICD-10-CM | POA: Diagnosis not present

## 2022-11-05 DIAGNOSIS — N390 Urinary tract infection, site not specified: Secondary | ICD-10-CM | POA: Diagnosis not present

## 2022-11-05 DIAGNOSIS — I700 Atherosclerosis of aorta: Secondary | ICD-10-CM | POA: Diagnosis not present

## 2022-11-05 DIAGNOSIS — E871 Hypo-osmolality and hyponatremia: Secondary | ICD-10-CM | POA: Diagnosis not present

## 2022-11-05 DIAGNOSIS — D649 Anemia, unspecified: Secondary | ICD-10-CM | POA: Diagnosis not present

## 2022-11-05 DIAGNOSIS — I10 Essential (primary) hypertension: Secondary | ICD-10-CM | POA: Diagnosis not present

## 2022-11-05 DIAGNOSIS — Z86711 Personal history of pulmonary embolism: Secondary | ICD-10-CM | POA: Diagnosis not present

## 2022-11-05 DIAGNOSIS — Z7982 Long term (current) use of aspirin: Secondary | ICD-10-CM | POA: Diagnosis not present

## 2022-11-05 DIAGNOSIS — J9621 Acute and chronic respiratory failure with hypoxia: Secondary | ICD-10-CM | POA: Diagnosis not present

## 2022-11-05 DIAGNOSIS — I160 Hypertensive urgency: Secondary | ICD-10-CM | POA: Diagnosis not present

## 2022-11-05 DIAGNOSIS — Z792 Long term (current) use of antibiotics: Secondary | ICD-10-CM | POA: Diagnosis not present

## 2022-11-05 DIAGNOSIS — Z87891 Personal history of nicotine dependence: Secondary | ICD-10-CM | POA: Diagnosis not present

## 2022-11-05 DIAGNOSIS — Z556 Problems related to health literacy: Secondary | ICD-10-CM | POA: Diagnosis not present

## 2022-11-05 DIAGNOSIS — M1990 Unspecified osteoarthritis, unspecified site: Secondary | ICD-10-CM | POA: Diagnosis not present

## 2022-11-05 DIAGNOSIS — I4811 Longstanding persistent atrial fibrillation: Secondary | ICD-10-CM | POA: Diagnosis not present

## 2022-11-05 DIAGNOSIS — Z9981 Dependence on supplemental oxygen: Secondary | ICD-10-CM | POA: Diagnosis not present

## 2022-11-05 NOTE — Telephone Encounter (Signed)
[facility] HH ORDERS 10-27-22

## 2022-11-06 ENCOUNTER — Other Ambulatory Visit: Payer: Self-pay | Admitting: Physician Assistant

## 2022-11-09 DIAGNOSIS — J449 Chronic obstructive pulmonary disease, unspecified: Secondary | ICD-10-CM | POA: Diagnosis not present

## 2022-11-10 ENCOUNTER — Telehealth: Payer: Self-pay

## 2022-11-10 DIAGNOSIS — I160 Hypertensive urgency: Secondary | ICD-10-CM | POA: Diagnosis not present

## 2022-11-10 DIAGNOSIS — Z86711 Personal history of pulmonary embolism: Secondary | ICD-10-CM | POA: Diagnosis not present

## 2022-11-10 DIAGNOSIS — M1990 Unspecified osteoarthritis, unspecified site: Secondary | ICD-10-CM | POA: Diagnosis not present

## 2022-11-10 DIAGNOSIS — N390 Urinary tract infection, site not specified: Secondary | ICD-10-CM | POA: Diagnosis not present

## 2022-11-10 DIAGNOSIS — Z87891 Personal history of nicotine dependence: Secondary | ICD-10-CM | POA: Diagnosis not present

## 2022-11-10 DIAGNOSIS — I10 Essential (primary) hypertension: Secondary | ICD-10-CM | POA: Diagnosis not present

## 2022-11-10 DIAGNOSIS — Z9981 Dependence on supplemental oxygen: Secondary | ICD-10-CM | POA: Diagnosis not present

## 2022-11-10 DIAGNOSIS — I700 Atherosclerosis of aorta: Secondary | ICD-10-CM | POA: Diagnosis not present

## 2022-11-10 DIAGNOSIS — Z556 Problems related to health literacy: Secondary | ICD-10-CM | POA: Diagnosis not present

## 2022-11-10 DIAGNOSIS — I4811 Longstanding persistent atrial fibrillation: Secondary | ICD-10-CM | POA: Diagnosis not present

## 2022-11-10 DIAGNOSIS — E871 Hypo-osmolality and hyponatremia: Secondary | ICD-10-CM | POA: Diagnosis not present

## 2022-11-10 DIAGNOSIS — J441 Chronic obstructive pulmonary disease with (acute) exacerbation: Secondary | ICD-10-CM | POA: Diagnosis not present

## 2022-11-10 DIAGNOSIS — D649 Anemia, unspecified: Secondary | ICD-10-CM | POA: Diagnosis not present

## 2022-11-10 DIAGNOSIS — J9621 Acute and chronic respiratory failure with hypoxia: Secondary | ICD-10-CM | POA: Diagnosis not present

## 2022-11-10 DIAGNOSIS — Z7952 Long term (current) use of systemic steroids: Secondary | ICD-10-CM | POA: Diagnosis not present

## 2022-11-10 DIAGNOSIS — Z7982 Long term (current) use of aspirin: Secondary | ICD-10-CM | POA: Diagnosis not present

## 2022-11-10 DIAGNOSIS — Z792 Long term (current) use of antibiotics: Secondary | ICD-10-CM | POA: Diagnosis not present

## 2022-11-10 DIAGNOSIS — Z7951 Long term (current) use of inhaled steroids: Secondary | ICD-10-CM | POA: Diagnosis not present

## 2022-11-10 NOTE — Telephone Encounter (Signed)
[facility]-REQUEST FOR APPROVAL OF HH POC DATE: 11/10/2022-ROLLATOR

## 2022-11-12 ENCOUNTER — Encounter: Payer: Self-pay | Admitting: Physician Assistant

## 2022-11-12 ENCOUNTER — Ambulatory Visit (INDEPENDENT_AMBULATORY_CARE_PROVIDER_SITE_OTHER): Payer: Medicare Other | Admitting: Physician Assistant

## 2022-11-12 VITALS — BP 148/90 | HR 78 | Temp 97.2°F | Ht 63.0 in | Wt 134.4 lb

## 2022-11-12 DIAGNOSIS — N760 Acute vaginitis: Secondary | ICD-10-CM

## 2022-11-12 DIAGNOSIS — Z7982 Long term (current) use of aspirin: Secondary | ICD-10-CM | POA: Diagnosis not present

## 2022-11-12 DIAGNOSIS — J441 Chronic obstructive pulmonary disease with (acute) exacerbation: Secondary | ICD-10-CM | POA: Diagnosis not present

## 2022-11-12 DIAGNOSIS — J438 Other emphysema: Secondary | ICD-10-CM | POA: Diagnosis not present

## 2022-11-12 DIAGNOSIS — M1990 Unspecified osteoarthritis, unspecified site: Secondary | ICD-10-CM | POA: Diagnosis not present

## 2022-11-12 DIAGNOSIS — Z9981 Dependence on supplemental oxygen: Secondary | ICD-10-CM | POA: Diagnosis not present

## 2022-11-12 DIAGNOSIS — R600 Localized edema: Secondary | ICD-10-CM

## 2022-11-12 DIAGNOSIS — Z7952 Long term (current) use of systemic steroids: Secondary | ICD-10-CM | POA: Diagnosis not present

## 2022-11-12 DIAGNOSIS — Z792 Long term (current) use of antibiotics: Secondary | ICD-10-CM | POA: Diagnosis not present

## 2022-11-12 DIAGNOSIS — Z86711 Personal history of pulmonary embolism: Secondary | ICD-10-CM | POA: Diagnosis not present

## 2022-11-12 DIAGNOSIS — I4811 Longstanding persistent atrial fibrillation: Secondary | ICD-10-CM | POA: Diagnosis not present

## 2022-11-12 DIAGNOSIS — N390 Urinary tract infection, site not specified: Secondary | ICD-10-CM | POA: Diagnosis not present

## 2022-11-12 DIAGNOSIS — I10 Essential (primary) hypertension: Secondary | ICD-10-CM

## 2022-11-12 DIAGNOSIS — E871 Hypo-osmolality and hyponatremia: Secondary | ICD-10-CM | POA: Diagnosis not present

## 2022-11-12 DIAGNOSIS — D649 Anemia, unspecified: Secondary | ICD-10-CM | POA: Diagnosis not present

## 2022-11-12 DIAGNOSIS — J9621 Acute and chronic respiratory failure with hypoxia: Secondary | ICD-10-CM | POA: Diagnosis not present

## 2022-11-12 DIAGNOSIS — I700 Atherosclerosis of aorta: Secondary | ICD-10-CM | POA: Diagnosis not present

## 2022-11-12 DIAGNOSIS — Z87891 Personal history of nicotine dependence: Secondary | ICD-10-CM | POA: Diagnosis not present

## 2022-11-12 DIAGNOSIS — Z7951 Long term (current) use of inhaled steroids: Secondary | ICD-10-CM | POA: Diagnosis not present

## 2022-11-12 DIAGNOSIS — I160 Hypertensive urgency: Secondary | ICD-10-CM | POA: Diagnosis not present

## 2022-11-12 DIAGNOSIS — Z556 Problems related to health literacy: Secondary | ICD-10-CM | POA: Diagnosis not present

## 2022-11-12 HISTORY — DX: Acute vaginitis: N76.0

## 2022-11-12 LAB — POCT URINALYSIS DIP (CLINITEK)
Bilirubin, UA: NEGATIVE
Blood, UA: NEGATIVE
Glucose, UA: NEGATIVE mg/dL
Ketones, POC UA: NEGATIVE mg/dL
Leukocytes, UA: NEGATIVE
Nitrite, UA: NEGATIVE
POC PROTEIN,UA: NEGATIVE
Spec Grav, UA: 1.015 (ref 1.010–1.025)
Urobilinogen, UA: 0.2 E.U./dL
pH, UA: 6 (ref 5.0–8.0)

## 2022-11-12 MED ORDER — FLUCONAZOLE 150 MG PO TABS
ORAL_TABLET | ORAL | 0 refills | Status: DC
Start: 2022-11-12 — End: 2023-02-05

## 2022-11-12 NOTE — Progress Notes (Signed)
Subjective:  Patient ID: Tina Jimenez, female    DOB: Mar 19, 1941  Age: 82 y.o. MRN: [id number]  Chief Complaint  Patient presents with   Hypertension    HPI Pt in today for follow up  Initially she was scheduled to recheck edema in lower legs.  She was having moderate edema and was given rx for lasix 20mg  qd to start also it was noted at last visit BNP was over 5000 and she was overdue for cardiology follow up and repeat echo) pt states she did not start the lasix at all because the medication 'made her nervous to take; She or her son have not called to schedule appt with cardiology as they have been recommended to do several times and state today that again they will call themselves to make appt Pt also was advised that she could stop her sodium supplements after her last labwork was done (she was discharged on this medication after being in the hospital) - however she has continued to take the supplements   It is noted today that bp is significantly elevated - pt states she has been taking her bp medication most days twice daily - recommend that we monitor it and have her make sure to take lopressor 50mg  bid and also to start the lasix as directed She denies chest pain or dyspnea  Pt complains of vaginal itching - she denies discharge.  She denies urine frequency, urgency or dysuria Pt would like rx for diflucan and defers vaginal examination today     10/20/2022   10:07 AM 06/17/2022    2:49 PM 02/19/2022    1:46 PM 09/15/2021    9:52 AM 05/21/2020    2:13 PM  Depression screen PHQ 2/9  Decreased Interest 2 0 0 0 0  Down, Depressed, Hopeless 0 0 0 0 0  PHQ - 2 Score 2 0 0 0 0  Altered sleeping 0      Tired, decreased energy 0      Change in appetite 0      Feeling bad or failure about yourself  0      Trouble concentrating 2      Moving slowly or fidgety/restless 0      Suicidal thoughts 0      PHQ-9 Score 4      Difficult doing work/chores Not difficult at all             10/16/2019    8:15 AM 09/15/2021    9:52 AM 02/19/2022    1:47 PM 06/17/2022    2:38 PM 10/20/2022   10:07 AM  Fall Risk  Falls in the past year?  0 0 0 Exclusion - non ambulatory  Was there an injury with Fall?  0 0 0 0  Fall Risk Category Calculator  0 0 0 0  Fall Risk Category (Retired)  Low Low Low   (RETIRED) Patient Fall Risk Level Moderate fall risk Low fall risk Low fall risk Low fall risk   Patient at Risk for Falls Due to   No Fall Risks;Impaired balance/gait Impaired balance/gait;Impaired mobility Impaired balance/gait;Impaired mobility  Fall risk Follow up   Falls evaluation completed;Education provided;Falls prevention discussed Falls evaluation completed;Education provided      ROS CONSTITUTIONAL: Negative for chills, fatigue, fever,  E/N/T: Negative for ear pain, nasal congestion and sore throat.  CARDIOVASCULAR: Negative for chest pain, dizziness, palpitations and pedal edema.  RESPIRATORY: Negative for recent cough and dyspnea.  GASTROINTESTINAL: Negative for abdominal  pain, acid reflux symptoms, constipation, diarrhea, nausea and vomiting.  GU - see HPI MSK: Negative for arthralgias and myalgias.  INTEGUMENTARY: Negative for rash.    Current Outpatient Medications:    Ascorbic Acid (VITAMIN C) 500 MG CAPS, Take 1 tablet by mouth daily., Disp: , Rfl:    aspirin EC 81 MG tablet, Take 81 mg by mouth every other day. Swallow whole., Disp: , Rfl:    aspirin-acetaminophen-caffeine (EXCEDRIN MIGRAINE) 250-250-65 MG tablet, Take 1-2 tablets by mouth every 6 (six) hours as needed for headache., Disp: 30 tablet, Rfl: 2   Bempedoic Acid-Ezetimibe (NEXLIZET) 180-10 MG TABS, Take 1 tablet by mouth daily in the afternoon., Disp: 90 tablet, Rfl: 1   Co-Enzyme Q-10 100 MG CAPS, Take 100 mg by mouth daily., Disp: , Rfl:    digoxin (LANOXIN) 0.125 MG tablet, Take 1 tablet (125 mcg total) by mouth daily., Disp: 90 tablet, Rfl: 1   DOCUSATE SODIUM PO, Take 200 mg by mouth daily., Disp: , Rfl:     famotidine (PEPCID) 20 MG tablet, Take 20 mg by mouth daily., Disp: , Rfl:    ferrous sulfate 325 (65 FE) MG tablet, 1 po qd with breakfast, Disp: 90 tablet, Rfl: 1   fluconazole (DIFLUCAN) 150 MG tablet, Take 1 po qod, Disp: 3 tablet, Rfl: 0   fluticasone (FLONASE) 50 MCG/ACT nasal spray, SPRAY 2 SPRAYS INTO EACH NOSTRIL EVERY DAY, Disp: 48 mL, Rfl: 2   fluticasone furoate-vilanterol (BREO ELLIPTA) 200-25 MCG/ACT AEPB, Inhale 1 puff into the lungs daily., Disp: 1 each, Rfl: 11   ipratropium-albuterol (DUONEB) 0.5-2.5 (3) MG/3ML SOLN, USE 1 NEBULE IN NEBULIZER EVERY MORNING, NOON, EVENING, AND BEDTIME., Disp: 1080 mL, Rfl: 1   LACTOBACILLUS PO, Take 1 tablet by mouth daily., Disp: , Rfl:    loratadine (CLARITIN) 10 MG tablet, TAKE 1 TABLET BY MOUTH ONCE DAILY FOR ALLERGIES, Disp: 90 tablet, Rfl: 1   metoprolol tartrate (LOPRESSOR) 50 MG tablet, Take 1 tablet (50 mg total) by mouth 2 (two) times daily., Disp: 180 tablet, Rfl: 0   omeprazole (PRILOSEC) 20 MG capsule, TAKE 1 CAPSULE BY MOUTH EVERY DAY, Disp: 90 capsule, Rfl: 1   triamcinolone cream (KENALOG) 0.1 %, APPLY TO AFFECTED AREA TWICE A DAY, Disp: 30 g, Rfl: 0   furosemide (LASIX) 20 MG tablet, Take 1 tablet (20 mg total) by mouth daily. (Patient not taking: Reported on 11/12/2022), Disp: 90 tablet, Rfl: 1  Past Medical History:  Diagnosis Date   Abdominal pain 10/13/2019   Abnormal blood chemistry 08/22/2019   Acute blood loss anemia 06/22/2019   Acute bronchitis with COPD (HCC) 08/15/2019   Acute cystitis with hematuria 10/26/2019   Acute embolism and thrombosis of deep veins of left upper extremity (HCC) 07/08/2019   Acute hypoxemic respiratory failure (HCC) 07/08/2019   Acute pain of left shoulder 10/26/2019   Acute posthemorrhagic anemia 07/08/2019   Acute pulmonary embolism without acute cor pulmonale (HCC) 06/21/2019   Adnexal mass    Anemia 01/08/2020   Arm DVT (deep venous thromboembolism), acute, left (HCC) 06/26/2019    Atrial fibrillation (HCC)    Atypical chest pain 03/04/2022   Bladder mass 10/26/2019   Compression fracture of body of thoracic vertebra (HCC) 2020   COPD (chronic obstructive pulmonary disease) (HCC)    COPD without exacerbation (HCC) 08/15/2019   Coronary artery calcification seen on CT scan 05/23/2020   COVID-19 07/08/2019   Dependence on supplemental oxygen 04/14/2020   Effusion, left ankle 08/03/2019   Effusion,  left foot 08/03/2019   Effusion, right ankle 08/03/2019   Effusion, right foot 08/03/2019   Elevated white blood cell count, unspecified 07/11/2019   Encounter for other orthopedic aftercare 07/08/2019   Essential (primary) hypertension 07/08/2019   Gastro-esophageal reflux disease without esophagitis 07/08/2019   History of pulmonary embolism 10/02/2019   Hyperlipemia    Hypertension 08/15/2019   Localized edema 08/22/2019   Mixed dyslipidemia 05/23/2020   Muscle weakness (generalized) 07/08/2019   Osteoporosis    Other disorders of electrolyte and fluid balance, not elsewhere classified 08/10/2019   Other emphysema (HCC) 03/04/2022   Other fatigue 01/08/2020   Oxygen deficiency    Paroxysmal atrial fibrillation (HCC) 06/20/2019   Persistent atrial fibrillation (HCC) 08/22/2019   Pneumonia 05/2019   COVID Pneumonia   Pneumonia due to COVID-19 virus 07/08/2019   Precordial pain 06/20/2019   Pulmonary embolism (HCC) 05/2019   Spinal stenosis, lumbar region without neurogenic claudication 06/14/2019   Unsteadiness on feet 07/08/2019   Vitamin D deficiency 06/22/2019   Wedge compression fracture of unspecified lumbar vertebra, subsequent encounter for fracture with routine healing 07/08/2019   Objective:  PHYSICAL EXAM:   BP (!) 148/90 (BP Location: Left Arm, Patient Position: Sitting, Cuff Size: Normal)   Pulse 78   Temp (!) 97.2 F (36.2 C) (Temporal)   Ht 5\' 3"  (1.6 m)   Wt 134 lb 6.4 oz (61 kg)   SpO2 96% Comment: 2L  BMI 23.81 kg/m     GEN: Well  nourished, well developed, in no acute distress - in wheelchair, on oxygen - accompanied by son Cardiac: RRR; no murmurs, rubs, or gallops,1+ nonpitting edema Respiratory:  normal respiratory rate and pattern with no distress - normal breath sounds with no rales, rhonchi, wheezes or rubs Skin: warm and dry, no rash  Psych: euthymic mood, appropriate affect and demeanor  Assessment & Plan:    Primary hypertension -     Comprehensive metabolic panel Take lopressor bid as directed Start lasix as directed  Acute vaginitis -     POCT URINALYSIS DIP (CLINITEK)  Localized edema Start lasix as directed Other emphysema (HCC) Continue oxygen, current meds  Other orders -     Fluconazole; Take 1 po qod  Dispense: 3 tablet; Refill: 0     Follow-up: Return in about 4 weeks (around 12/10/2022).  An After Visit Summary was printed and given to the patient.  Jettie Pagan [facility] [phone]

## 2022-11-13 ENCOUNTER — Telehealth: Payer: Self-pay

## 2022-11-13 DIAGNOSIS — Z9981 Dependence on supplemental oxygen: Secondary | ICD-10-CM | POA: Diagnosis not present

## 2022-11-13 DIAGNOSIS — J441 Chronic obstructive pulmonary disease with (acute) exacerbation: Secondary | ICD-10-CM | POA: Diagnosis not present

## 2022-11-13 DIAGNOSIS — D649 Anemia, unspecified: Secondary | ICD-10-CM | POA: Diagnosis not present

## 2022-11-13 DIAGNOSIS — E871 Hypo-osmolality and hyponatremia: Secondary | ICD-10-CM | POA: Diagnosis not present

## 2022-11-13 DIAGNOSIS — Z7952 Long term (current) use of systemic steroids: Secondary | ICD-10-CM | POA: Diagnosis not present

## 2022-11-13 DIAGNOSIS — J449 Chronic obstructive pulmonary disease, unspecified: Secondary | ICD-10-CM | POA: Diagnosis not present

## 2022-11-13 DIAGNOSIS — I4811 Longstanding persistent atrial fibrillation: Secondary | ICD-10-CM | POA: Diagnosis not present

## 2022-11-13 DIAGNOSIS — I10 Essential (primary) hypertension: Secondary | ICD-10-CM | POA: Diagnosis not present

## 2022-11-13 DIAGNOSIS — N390 Urinary tract infection, site not specified: Secondary | ICD-10-CM | POA: Diagnosis not present

## 2022-11-13 DIAGNOSIS — Z556 Problems related to health literacy: Secondary | ICD-10-CM | POA: Diagnosis not present

## 2022-11-13 DIAGNOSIS — I700 Atherosclerosis of aorta: Secondary | ICD-10-CM | POA: Diagnosis not present

## 2022-11-13 DIAGNOSIS — Z7982 Long term (current) use of aspirin: Secondary | ICD-10-CM | POA: Diagnosis not present

## 2022-11-13 DIAGNOSIS — Z86711 Personal history of pulmonary embolism: Secondary | ICD-10-CM | POA: Diagnosis not present

## 2022-11-13 DIAGNOSIS — Z7951 Long term (current) use of inhaled steroids: Secondary | ICD-10-CM | POA: Diagnosis not present

## 2022-11-13 DIAGNOSIS — Z792 Long term (current) use of antibiotics: Secondary | ICD-10-CM | POA: Diagnosis not present

## 2022-11-13 DIAGNOSIS — Z87891 Personal history of nicotine dependence: Secondary | ICD-10-CM | POA: Diagnosis not present

## 2022-11-13 DIAGNOSIS — J9621 Acute and chronic respiratory failure with hypoxia: Secondary | ICD-10-CM | POA: Diagnosis not present

## 2022-11-13 DIAGNOSIS — M1990 Unspecified osteoarthritis, unspecified site: Secondary | ICD-10-CM | POA: Diagnosis not present

## 2022-11-13 DIAGNOSIS — I160 Hypertensive urgency: Secondary | ICD-10-CM | POA: Diagnosis not present

## 2022-11-13 LAB — COMPREHENSIVE METABOLIC PANEL
ALT: 20 IU/L (ref 0–32)
AST: 26 IU/L (ref 0–40)
Albumin/Globulin Ratio: 1.8 (ref 1.2–2.2)
Albumin: 4.2 g/dL (ref 3.8–4.8)
Alkaline Phosphatase: 92 IU/L (ref 44–121)
BUN/Creatinine Ratio: 20 (ref 12–28)
BUN: 12 mg/dL (ref 8–27)
Bilirubin Total: 0.4 mg/dL (ref 0.0–1.2)
CO2: 31 mmol/L — ABNORMAL HIGH (ref 20–29)
Calcium: 9.9 mg/dL (ref 8.7–10.3)
Chloride: 95 mmol/L — ABNORMAL LOW (ref 96–106)
Creatinine, Ser: 0.59 mg/dL (ref 0.57–1.00)
Globulin, Total: 2.3 g/dL (ref 1.5–4.5)
Glucose: 118 mg/dL — ABNORMAL HIGH (ref 70–99)
Potassium: 5 mmol/L (ref 3.5–5.2)
Sodium: 142 mmol/L (ref 134–144)
Total Protein: 6.5 g/dL (ref 6.0–8.5)
eGFR: 91 mL/min/{1.73_m2} (ref >59)

## 2022-11-13 NOTE — Telephone Encounter (Signed)
Patient called to let us know that she has her scheduled appointment with cardiology which is scheduled for 12/25/22 @ 4:00 pm.

## 2022-11-17 DIAGNOSIS — D649 Anemia, unspecified: Secondary | ICD-10-CM | POA: Diagnosis not present

## 2022-11-17 DIAGNOSIS — I160 Hypertensive urgency: Secondary | ICD-10-CM | POA: Diagnosis not present

## 2022-11-17 DIAGNOSIS — Z7951 Long term (current) use of inhaled steroids: Secondary | ICD-10-CM | POA: Diagnosis not present

## 2022-11-17 DIAGNOSIS — Z9981 Dependence on supplemental oxygen: Secondary | ICD-10-CM | POA: Diagnosis not present

## 2022-11-17 DIAGNOSIS — Z556 Problems related to health literacy: Secondary | ICD-10-CM | POA: Diagnosis not present

## 2022-11-17 DIAGNOSIS — I10 Essential (primary) hypertension: Secondary | ICD-10-CM | POA: Diagnosis not present

## 2022-11-17 DIAGNOSIS — E871 Hypo-osmolality and hyponatremia: Secondary | ICD-10-CM | POA: Diagnosis not present

## 2022-11-17 DIAGNOSIS — Z7982 Long term (current) use of aspirin: Secondary | ICD-10-CM | POA: Diagnosis not present

## 2022-11-17 DIAGNOSIS — Z87891 Personal history of nicotine dependence: Secondary | ICD-10-CM | POA: Diagnosis not present

## 2022-11-17 DIAGNOSIS — I4811 Longstanding persistent atrial fibrillation: Secondary | ICD-10-CM | POA: Diagnosis not present

## 2022-11-17 DIAGNOSIS — J9621 Acute and chronic respiratory failure with hypoxia: Secondary | ICD-10-CM | POA: Diagnosis not present

## 2022-11-17 DIAGNOSIS — Z86711 Personal history of pulmonary embolism: Secondary | ICD-10-CM | POA: Diagnosis not present

## 2022-11-17 DIAGNOSIS — M1990 Unspecified osteoarthritis, unspecified site: Secondary | ICD-10-CM | POA: Diagnosis not present

## 2022-11-17 DIAGNOSIS — I700 Atherosclerosis of aorta: Secondary | ICD-10-CM | POA: Diagnosis not present

## 2022-11-17 DIAGNOSIS — N390 Urinary tract infection, site not specified: Secondary | ICD-10-CM | POA: Diagnosis not present

## 2022-11-17 DIAGNOSIS — Z792 Long term (current) use of antibiotics: Secondary | ICD-10-CM | POA: Diagnosis not present

## 2022-11-17 DIAGNOSIS — Z7952 Long term (current) use of systemic steroids: Secondary | ICD-10-CM | POA: Diagnosis not present

## 2022-11-17 DIAGNOSIS — J441 Chronic obstructive pulmonary disease with (acute) exacerbation: Secondary | ICD-10-CM | POA: Diagnosis not present

## 2022-11-18 DIAGNOSIS — E871 Hypo-osmolality and hyponatremia: Secondary | ICD-10-CM | POA: Diagnosis not present

## 2022-11-18 DIAGNOSIS — Z792 Long term (current) use of antibiotics: Secondary | ICD-10-CM | POA: Diagnosis not present

## 2022-11-18 DIAGNOSIS — I10 Essential (primary) hypertension: Secondary | ICD-10-CM | POA: Diagnosis not present

## 2022-11-18 DIAGNOSIS — Z7952 Long term (current) use of systemic steroids: Secondary | ICD-10-CM | POA: Diagnosis not present

## 2022-11-18 DIAGNOSIS — Z7982 Long term (current) use of aspirin: Secondary | ICD-10-CM | POA: Diagnosis not present

## 2022-11-18 DIAGNOSIS — J441 Chronic obstructive pulmonary disease with (acute) exacerbation: Secondary | ICD-10-CM | POA: Diagnosis not present

## 2022-11-18 DIAGNOSIS — D649 Anemia, unspecified: Secondary | ICD-10-CM | POA: Diagnosis not present

## 2022-11-18 DIAGNOSIS — Z556 Problems related to health literacy: Secondary | ICD-10-CM | POA: Diagnosis not present

## 2022-11-18 DIAGNOSIS — Z9981 Dependence on supplemental oxygen: Secondary | ICD-10-CM | POA: Diagnosis not present

## 2022-11-18 DIAGNOSIS — I4811 Longstanding persistent atrial fibrillation: Secondary | ICD-10-CM | POA: Diagnosis not present

## 2022-11-18 DIAGNOSIS — Z7951 Long term (current) use of inhaled steroids: Secondary | ICD-10-CM | POA: Diagnosis not present

## 2022-11-18 DIAGNOSIS — J9621 Acute and chronic respiratory failure with hypoxia: Secondary | ICD-10-CM | POA: Diagnosis not present

## 2022-11-18 DIAGNOSIS — Z86711 Personal history of pulmonary embolism: Secondary | ICD-10-CM | POA: Diagnosis not present

## 2022-11-18 DIAGNOSIS — M1990 Unspecified osteoarthritis, unspecified site: Secondary | ICD-10-CM | POA: Diagnosis not present

## 2022-11-18 DIAGNOSIS — N390 Urinary tract infection, site not specified: Secondary | ICD-10-CM | POA: Diagnosis not present

## 2022-11-18 DIAGNOSIS — Z87891 Personal history of nicotine dependence: Secondary | ICD-10-CM | POA: Diagnosis not present

## 2022-11-18 DIAGNOSIS — I700 Atherosclerosis of aorta: Secondary | ICD-10-CM | POA: Diagnosis not present

## 2022-11-18 DIAGNOSIS — I160 Hypertensive urgency: Secondary | ICD-10-CM | POA: Diagnosis not present

## 2022-11-19 ENCOUNTER — Ambulatory Visit (INDEPENDENT_AMBULATORY_CARE_PROVIDER_SITE_OTHER): Payer: Medicare Other

## 2022-11-19 ENCOUNTER — Other Ambulatory Visit: Payer: Self-pay

## 2022-11-19 ENCOUNTER — Telehealth: Payer: Medicare Other

## 2022-11-19 DIAGNOSIS — I10 Essential (primary) hypertension: Secondary | ICD-10-CM

## 2022-11-19 DIAGNOSIS — J438 Other emphysema: Secondary | ICD-10-CM

## 2022-11-19 DIAGNOSIS — Z8709 Personal history of other diseases of the respiratory system: Secondary | ICD-10-CM

## 2022-11-19 MED ORDER — FLUTICASONE PROPIONATE 50 MCG/ACT NA SUSP: NASAL | 2 refills | Status: DC

## 2022-11-19 NOTE — Patient Instructions (Signed)
Please call the care guide team at [phone] if you need to cancel or reschedule your appointment.   If you are experiencing a Mental Health or Behavioral Health Crisis or need someone to talk to, please call the Suicide and Crisis Lifeline: 988 call the [place] National Suicide Prevention Lifeline: 534-129-6236 or TTY: (705) 566-2457 TTY 606-814-4164) to talk to a trained counselor call 1-800-273-TALK (toll free, 24 hour hotline) go to [facility] [address] [place] [phone])   Following is a copy of the CCM Program Consent:  CCM service includes personalized support from designated clinical staff supervised by the physician, including individualized plan of care and coordination with other care providers 24/7 contact phone numbers for assistance for urgent and routine care needs. Service will only be billed when office clinical staff spend 20 minutes or more in a month to coordinate care. Only one practitioner may furnish and bill the service in a calendar month. The patient may stop CCM services at amy time (effective at the end of the month) by phone call to the office staff. The patient will be responsible for cost sharing (co-pay) or up to 20% of the service fee (after annual deductible is met)  Following is a copy of your full provider care plan:   Goals Addressed             This Visit's Progress    CCM Expected Outcome:  Monitor, Self-Manage, and Reduce Symptoms of Hypertension       Current Barriers:  Chronic Disease Management support and education needs related to Effective management of HTN BP Readings from Last 3 Encounters:  11/12/22 (!) 148/90  10/20/22 130/72  03/23/22 112/70     Planned Interventions: Evaluation of current treatment plan related to hypertension self management and patient's adherence to plan as established by provider. The patient had an elevated blood pressure in the office but she states at home it  has been good and denies any new problems. She does get frustrated with not being able to do things like she use to do so. Reflective listening and support given.  Provided education to patient re: stroke prevention, s/s of heart attack and stroke; Reviewed prescribed diet heart healthy diet. The patient sometimes has a good appetite and sometimes does not. Education on staying hydrated and on the days she is not eating well to drink protein drinks to help with getting her nutrients. The patient states she does like these types of things and has done this before. She is afraid to drink too much as she has swelling in her feet and legs at times. She was concerned recently and did not take the Lasix but after seeing the pcp she did take it and now she does not have swelling in her feet or legs. Encouraged the patient to follow the plan of care for effective management of her HTN and heart health.  Reviewed medications with patient and discussed importance of compliance. The patient is compliant with medications and works with the pharm D on a regular basis. States after her last visit she is taking the Lasix as directed.   Discussed plans with patient for ongoing care management follow up and provided patient with direct contact information for care management team; Advised patient, providing education and rationale, to monitor blood pressure daily and record, calling PCP for findings outside established parameters. She monitors her readings at home. Denies any acute changes.;  Reviewed scheduled/upcoming provider appointments including: 12-10-2022 at 340 pm  Advised patient to discuss changes in blood pressure or heart health with provider; Provided education on prescribed diet heart healthy diet;  Discussed complications of poorly controlled blood pressure such as heart disease, stroke, circulatory complications, vision complications, kidney impairment, sexual dysfunction;  Screening for signs and symptoms  of depression related to chronic disease state;  Assessed social determinant of health barriers;   Symptom Management: Take medications as prescribed   Attend all scheduled provider appointments Call provider office for new concerns or questions  call the Suicide and Crisis Lifeline: 988 call the [place] National Suicide Prevention Lifeline: (662) 445-6385 or TTY: 850-009-6523 TTY 220-148-0703) to talk to a trained counselor call 1-800-273-TALK (toll free, 24 hour hotline) if experiencing a Mental Health or Behavioral Health Crisis  check blood pressure weekly learn about high blood pressure call doctor for signs and symptoms of high blood pressure develop an action plan for high blood pressure keep all doctor appointments take medications for blood pressure exactly as prescribed report new symptoms to your doctor  Follow Up Plan: Telephone follow up appointment with care management team member scheduled for: 01-21-2023 at 1145 am       CCM:  Maintain, Monitor and Self-Manage Symptoms of COPD       Current Barriers:  Care Coordination needs related to cost constraints to inhalers, currently working with the pharm D in a patient with COPD Chronic Disease Management support and education needs related to effective management of COPD  Planned Interventions: Provided patient with basic written and verbal COPD education on self care/management/and exacerbation prevention. The patient was okay today but not the "best". She says that she has noticed getting more short of breath with even going to the bathroom and she has to be mindful of her activity. She also has been worried because she gave the pharm D permission to send her medications from [place] but now she has changed her mind. Collaboration with pharm D and Development worker, international aid. The pharm D has called and talked to the patient.  Advised patient to track and manage COPD triggers. Education on things that may trigger her COPD. She is pacing her  activity and knows her limitations. The patient states that the weather sometimes impacts her breathing also. Reflective listening and support  Provided written and verbal instructions on pursed lip breathing and utilized returned demonstration as teach back Provided instruction about proper use of medications used for management of COPD including inhalers. The patient gets stressed out easily over medications and changes. She reads labels and worries about what the medication may do to her. Encouraged the patient to trust the recommendations of the pcp.  Advised patient to self assesses COPD action plan zone and make appointment with provider if in the yellow zone for 48 hours without improvement Advised patient to engage in light exercise as tolerated 3-5 days a week to aid in the the management of COPD Provided education about and advised patient to utilize infection prevention strategies to reduce risk of respiratory infection Discussed the importance of adequate rest and management of fatigue with COPD Screening for signs and symptoms of depression related to chronic disease state  Assessed social determinant of health barriers  Symptom Management: Take medications as prescribed   Attend all scheduled provider appointments Call provider office for new concerns or questions  call the Suicide and Crisis Lifeline: 988 call the [place] National Suicide Prevention Lifeline: 715 691 0394 or TTY: 727-614-7350 TTY (828) 524-4673) to talk to a trained counselor call 1-800-273-TALK (toll free, 24 hour  hotline) if experiencing a Mental Health or Behavioral Health Crisis  identify and remove indoor air pollutants limit outdoor activity during cold weather listen for public air quality announcements every day do breathing exercises every day develop a rescue plan eliminate symptom triggers at home follow rescue plan if symptoms flare-up  Follow Up Plan: Telephone follow up appointment with care  management team member scheduled for: 01-21-2023 at 1145 am          The patient verbalized understanding of instructions, educational materials, and care plan provided today and DECLINED offer to receive copy of patient instructions, educational materials, and care plan.  Telephone follow up appointment with care management team member scheduled for: 01-21-2023 at 1145 am

## 2022-11-19 NOTE — Chronic Care Management (AMB) (Signed)
Chronic Care Management   CCM RN Visit Note  11/19/2022 Name: Tina Jimenez MRN: [id number] DOB: 02/19/1941  Subjective: Tina Jimenez is a 82 y.o. year old female who is a primary care patient of Marianne Sofia, Cordelia Poche. The patient was referred to the Chronic Care Management team for assistance with care management needs subsequent to provider initiation of CCM services and plan of care.    Today's Visit:  Engaged with patient by telephone for follow up visit.        Goals Addressed             This Visit's Progress    CCM Expected Outcome:  Monitor, Self-Manage, and Reduce Symptoms of Hypertension       Current Barriers:  Chronic Disease Management support and education needs related to Effective management of HTN BP Readings from Last 3 Encounters:  11/12/22 (!) 148/90  10/20/22 130/72  03/23/22 112/70     Planned Interventions: Evaluation of current treatment plan related to hypertension self management and patient's adherence to plan as established by provider. The patient had an elevated blood pressure in the office but she states at home it has been good and denies any new problems. She does get frustrated with not being able to do things like she use to do so. Reflective listening and support given.  Provided education to patient re: stroke prevention, s/s of heart attack and stroke; Reviewed prescribed diet heart healthy diet. The patient sometimes has a good appetite and sometimes does not. Education on staying hydrated and on the days she is not eating well to drink protein drinks to help with getting her nutrients. The patient states she does like these types of things and has done this before. She is afraid to drink too much as she has swelling in her feet and legs at times. She was concerned recently and did not take the Lasix but after seeing the pcp she did take it and now she does not have swelling in her feet or legs. Encouraged the patient to follow the plan  of care for effective management of her HTN and heart health.  Reviewed medications with patient and discussed importance of compliance. The patient is compliant with medications and works with the pharm D on a regular basis. States after her last visit she is taking the Lasix as directed.   Discussed plans with patient for ongoing care management follow up and provided patient with direct contact information for care management team; Advised patient, providing education and rationale, to monitor blood pressure daily and record, calling PCP for findings outside established parameters. She monitors her readings at home. Denies any acute changes.;  Reviewed scheduled/upcoming provider appointments including: 12-10-2022 at 340 pm Advised patient to discuss changes in blood pressure or heart health with provider; Provided education on prescribed diet heart healthy diet;  Discussed complications of poorly controlled blood pressure such as heart disease, stroke, circulatory complications, vision complications, kidney impairment, sexual dysfunction;  Screening for signs and symptoms of depression related to chronic disease state;  Assessed social determinant of health barriers;   Symptom Management: Take medications as prescribed   Attend all scheduled provider appointments Call provider office for new concerns or questions  call the Suicide and Crisis Lifeline: 988 call the [place] National Suicide Prevention Lifeline: 669-344-7696 or TTY: (517) 571-3261 TTY 407 227 3872) to talk to a trained counselor call 1-800-273-TALK (toll free, 24 hour hotline) if experiencing a Mental Health or Behavioral Health Crisis  check blood pressure  weekly learn about high blood pressure call doctor for signs and symptoms of high blood pressure develop an action plan for high blood pressure keep all doctor appointments take medications for blood pressure exactly as prescribed report new symptoms to your doctor  Follow  Up Plan: Telephone follow up appointment with care management team member scheduled for: 01-21-2023 at 1145 am       CCM:  Maintain, Monitor and Self-Manage Symptoms of COPD       Current Barriers:  Care Coordination needs related to cost constraints to inhalers, currently working with the pharm D in a patient with COPD Chronic Disease Management support and education needs related to effective management of COPD  Planned Interventions: Provided patient with basic written and verbal COPD education on self care/management/and exacerbation prevention. The patient was okay today but not the "best". She says that she has noticed getting more short of breath with even going to the bathroom and she has to be mindful of her activity. She also has been worried because she gave the pharm D permission to send her medications from [place] but now she has changed her mind. Collaboration with pharm D and Development worker, international aid. The pharm D has called and talked to the patient.  Advised patient to track and manage COPD triggers. Education on things that may trigger her COPD. She is pacing her activity and knows her limitations. The patient states that the weather sometimes impacts her breathing also. Reflective listening and support  Provided written and verbal instructions on pursed lip breathing and utilized returned demonstration as teach back Provided instruction about proper use of medications used for management of COPD including inhalers. The patient gets stressed out easily over medications and changes. She reads labels and worries about what the medication may do to her. Encouraged the patient to trust the recommendations of the pcp.  Advised patient to self assesses COPD action plan zone and make appointment with provider if in the yellow zone for 48 hours without improvement Advised patient to engage in light exercise as tolerated 3-5 days a week to aid in the the management of COPD Provided education about and  advised patient to utilize infection prevention strategies to reduce risk of respiratory infection Discussed the importance of adequate rest and management of fatigue with COPD Screening for signs and symptoms of depression related to chronic disease state  Assessed social determinant of health barriers  Symptom Management: Take medications as prescribed   Attend all scheduled provider appointments Call provider office for new concerns or questions  call the Suicide and Crisis Lifeline: 988 call the [place] National Suicide Prevention Lifeline: 248-601-1273 or TTY: 802-642-3385 TTY 346-346-6454) to talk to a trained counselor call 1-800-273-TALK (toll free, 24 hour hotline) if experiencing a Mental Health or Behavioral Health Crisis  identify and remove indoor air pollutants limit outdoor activity during cold weather listen for public air quality announcements every day do breathing exercises every day develop a rescue plan eliminate symptom triggers at home follow rescue plan if symptoms flare-up  Follow Up Plan: Telephone follow up appointment with care management team member scheduled for: 01-21-2023 at 1145 am          Plan:Telephone follow up appointment with care management team member scheduled for:  01-21-2023 at 1145 am  Alto Denver RN, MSN, CCM RN Care Manager  Chronic Care Management Direct Number: [phone]

## 2022-11-23 DIAGNOSIS — J9621 Acute and chronic respiratory failure with hypoxia: Secondary | ICD-10-CM | POA: Diagnosis not present

## 2022-11-23 DIAGNOSIS — N390 Urinary tract infection, site not specified: Secondary | ICD-10-CM | POA: Diagnosis not present

## 2022-11-23 DIAGNOSIS — Z7982 Long term (current) use of aspirin: Secondary | ICD-10-CM | POA: Diagnosis not present

## 2022-11-23 DIAGNOSIS — E871 Hypo-osmolality and hyponatremia: Secondary | ICD-10-CM | POA: Diagnosis not present

## 2022-11-23 DIAGNOSIS — Z86711 Personal history of pulmonary embolism: Secondary | ICD-10-CM | POA: Diagnosis not present

## 2022-11-23 DIAGNOSIS — I160 Hypertensive urgency: Secondary | ICD-10-CM | POA: Diagnosis not present

## 2022-11-23 DIAGNOSIS — Z87891 Personal history of nicotine dependence: Secondary | ICD-10-CM | POA: Diagnosis not present

## 2022-11-23 DIAGNOSIS — D649 Anemia, unspecified: Secondary | ICD-10-CM | POA: Diagnosis not present

## 2022-11-23 DIAGNOSIS — M1990 Unspecified osteoarthritis, unspecified site: Secondary | ICD-10-CM | POA: Diagnosis not present

## 2022-11-23 DIAGNOSIS — I10 Essential (primary) hypertension: Secondary | ICD-10-CM | POA: Diagnosis not present

## 2022-11-23 DIAGNOSIS — I4811 Longstanding persistent atrial fibrillation: Secondary | ICD-10-CM | POA: Diagnosis not present

## 2022-11-23 DIAGNOSIS — Z7952 Long term (current) use of systemic steroids: Secondary | ICD-10-CM | POA: Diagnosis not present

## 2022-11-23 DIAGNOSIS — Z7951 Long term (current) use of inhaled steroids: Secondary | ICD-10-CM | POA: Diagnosis not present

## 2022-11-23 DIAGNOSIS — Z556 Problems related to health literacy: Secondary | ICD-10-CM | POA: Diagnosis not present

## 2022-11-23 DIAGNOSIS — Z792 Long term (current) use of antibiotics: Secondary | ICD-10-CM | POA: Diagnosis not present

## 2022-11-23 DIAGNOSIS — J441 Chronic obstructive pulmonary disease with (acute) exacerbation: Secondary | ICD-10-CM | POA: Diagnosis not present

## 2022-11-23 DIAGNOSIS — I700 Atherosclerosis of aorta: Secondary | ICD-10-CM | POA: Diagnosis not present

## 2022-11-23 DIAGNOSIS — Z9981 Dependence on supplemental oxygen: Secondary | ICD-10-CM | POA: Diagnosis not present

## 2022-11-25 DIAGNOSIS — N390 Urinary tract infection, site not specified: Secondary | ICD-10-CM | POA: Diagnosis not present

## 2022-11-25 DIAGNOSIS — M1990 Unspecified osteoarthritis, unspecified site: Secondary | ICD-10-CM | POA: Diagnosis not present

## 2022-11-25 DIAGNOSIS — I4811 Longstanding persistent atrial fibrillation: Secondary | ICD-10-CM | POA: Diagnosis not present

## 2022-11-25 DIAGNOSIS — Z556 Problems related to health literacy: Secondary | ICD-10-CM | POA: Diagnosis not present

## 2022-11-25 DIAGNOSIS — Z7982 Long term (current) use of aspirin: Secondary | ICD-10-CM | POA: Diagnosis not present

## 2022-11-25 DIAGNOSIS — J9621 Acute and chronic respiratory failure with hypoxia: Secondary | ICD-10-CM | POA: Diagnosis not present

## 2022-11-25 DIAGNOSIS — J441 Chronic obstructive pulmonary disease with (acute) exacerbation: Secondary | ICD-10-CM | POA: Diagnosis not present

## 2022-11-25 DIAGNOSIS — Z87891 Personal history of nicotine dependence: Secondary | ICD-10-CM | POA: Diagnosis not present

## 2022-11-25 DIAGNOSIS — Z7951 Long term (current) use of inhaled steroids: Secondary | ICD-10-CM | POA: Diagnosis not present

## 2022-11-25 DIAGNOSIS — I700 Atherosclerosis of aorta: Secondary | ICD-10-CM | POA: Diagnosis not present

## 2022-11-25 DIAGNOSIS — Z7952 Long term (current) use of systemic steroids: Secondary | ICD-10-CM | POA: Diagnosis not present

## 2022-11-25 DIAGNOSIS — I160 Hypertensive urgency: Secondary | ICD-10-CM | POA: Diagnosis not present

## 2022-11-25 DIAGNOSIS — I10 Essential (primary) hypertension: Secondary | ICD-10-CM | POA: Diagnosis not present

## 2022-11-25 DIAGNOSIS — E871 Hypo-osmolality and hyponatremia: Secondary | ICD-10-CM | POA: Diagnosis not present

## 2022-11-25 DIAGNOSIS — Z86711 Personal history of pulmonary embolism: Secondary | ICD-10-CM | POA: Diagnosis not present

## 2022-11-25 DIAGNOSIS — Z792 Long term (current) use of antibiotics: Secondary | ICD-10-CM | POA: Diagnosis not present

## 2022-11-25 DIAGNOSIS — Z9981 Dependence on supplemental oxygen: Secondary | ICD-10-CM | POA: Diagnosis not present

## 2022-11-25 DIAGNOSIS — D649 Anemia, unspecified: Secondary | ICD-10-CM | POA: Diagnosis not present

## 2022-11-30 DIAGNOSIS — I4811 Longstanding persistent atrial fibrillation: Secondary | ICD-10-CM | POA: Diagnosis not present

## 2022-11-30 DIAGNOSIS — N390 Urinary tract infection, site not specified: Secondary | ICD-10-CM | POA: Diagnosis not present

## 2022-11-30 DIAGNOSIS — Z556 Problems related to health literacy: Secondary | ICD-10-CM | POA: Diagnosis not present

## 2022-11-30 DIAGNOSIS — Z7952 Long term (current) use of systemic steroids: Secondary | ICD-10-CM | POA: Diagnosis not present

## 2022-11-30 DIAGNOSIS — Z792 Long term (current) use of antibiotics: Secondary | ICD-10-CM | POA: Diagnosis not present

## 2022-11-30 DIAGNOSIS — I160 Hypertensive urgency: Secondary | ICD-10-CM | POA: Diagnosis not present

## 2022-11-30 DIAGNOSIS — J9621 Acute and chronic respiratory failure with hypoxia: Secondary | ICD-10-CM | POA: Diagnosis not present

## 2022-11-30 DIAGNOSIS — I700 Atherosclerosis of aorta: Secondary | ICD-10-CM | POA: Diagnosis not present

## 2022-11-30 DIAGNOSIS — Z9981 Dependence on supplemental oxygen: Secondary | ICD-10-CM | POA: Diagnosis not present

## 2022-11-30 DIAGNOSIS — I10 Essential (primary) hypertension: Secondary | ICD-10-CM | POA: Diagnosis not present

## 2022-11-30 DIAGNOSIS — E871 Hypo-osmolality and hyponatremia: Secondary | ICD-10-CM | POA: Diagnosis not present

## 2022-11-30 DIAGNOSIS — M1990 Unspecified osteoarthritis, unspecified site: Secondary | ICD-10-CM | POA: Diagnosis not present

## 2022-11-30 DIAGNOSIS — D649 Anemia, unspecified: Secondary | ICD-10-CM | POA: Diagnosis not present

## 2022-11-30 DIAGNOSIS — Z87891 Personal history of nicotine dependence: Secondary | ICD-10-CM | POA: Diagnosis not present

## 2022-11-30 DIAGNOSIS — Z7982 Long term (current) use of aspirin: Secondary | ICD-10-CM | POA: Diagnosis not present

## 2022-11-30 DIAGNOSIS — J441 Chronic obstructive pulmonary disease with (acute) exacerbation: Secondary | ICD-10-CM | POA: Diagnosis not present

## 2022-11-30 DIAGNOSIS — Z7951 Long term (current) use of inhaled steroids: Secondary | ICD-10-CM | POA: Diagnosis not present

## 2022-11-30 DIAGNOSIS — Z86711 Personal history of pulmonary embolism: Secondary | ICD-10-CM | POA: Diagnosis not present

## 2022-12-01 DIAGNOSIS — Z7951 Long term (current) use of inhaled steroids: Secondary | ICD-10-CM | POA: Diagnosis not present

## 2022-12-01 DIAGNOSIS — Z556 Problems related to health literacy: Secondary | ICD-10-CM | POA: Diagnosis not present

## 2022-12-01 DIAGNOSIS — I160 Hypertensive urgency: Secondary | ICD-10-CM | POA: Diagnosis not present

## 2022-12-01 DIAGNOSIS — Z7982 Long term (current) use of aspirin: Secondary | ICD-10-CM | POA: Diagnosis not present

## 2022-12-01 DIAGNOSIS — Z9981 Dependence on supplemental oxygen: Secondary | ICD-10-CM | POA: Diagnosis not present

## 2022-12-01 DIAGNOSIS — Z86711 Personal history of pulmonary embolism: Secondary | ICD-10-CM | POA: Diagnosis not present

## 2022-12-01 DIAGNOSIS — I10 Essential (primary) hypertension: Secondary | ICD-10-CM | POA: Diagnosis not present

## 2022-12-01 DIAGNOSIS — J9621 Acute and chronic respiratory failure with hypoxia: Secondary | ICD-10-CM | POA: Diagnosis not present

## 2022-12-01 DIAGNOSIS — M1990 Unspecified osteoarthritis, unspecified site: Secondary | ICD-10-CM | POA: Diagnosis not present

## 2022-12-01 DIAGNOSIS — Z7952 Long term (current) use of systemic steroids: Secondary | ICD-10-CM | POA: Diagnosis not present

## 2022-12-01 DIAGNOSIS — J441 Chronic obstructive pulmonary disease with (acute) exacerbation: Secondary | ICD-10-CM | POA: Diagnosis not present

## 2022-12-01 DIAGNOSIS — Z87891 Personal history of nicotine dependence: Secondary | ICD-10-CM | POA: Diagnosis not present

## 2022-12-01 DIAGNOSIS — D649 Anemia, unspecified: Secondary | ICD-10-CM | POA: Diagnosis not present

## 2022-12-01 DIAGNOSIS — E871 Hypo-osmolality and hyponatremia: Secondary | ICD-10-CM | POA: Diagnosis not present

## 2022-12-01 DIAGNOSIS — I700 Atherosclerosis of aorta: Secondary | ICD-10-CM | POA: Diagnosis not present

## 2022-12-01 DIAGNOSIS — I4811 Longstanding persistent atrial fibrillation: Secondary | ICD-10-CM | POA: Diagnosis not present

## 2022-12-01 DIAGNOSIS — Z792 Long term (current) use of antibiotics: Secondary | ICD-10-CM | POA: Diagnosis not present

## 2022-12-01 DIAGNOSIS — N390 Urinary tract infection, site not specified: Secondary | ICD-10-CM | POA: Diagnosis not present

## 2022-12-08 DIAGNOSIS — E871 Hypo-osmolality and hyponatremia: Secondary | ICD-10-CM | POA: Diagnosis not present

## 2022-12-08 DIAGNOSIS — N390 Urinary tract infection, site not specified: Secondary | ICD-10-CM | POA: Diagnosis not present

## 2022-12-08 DIAGNOSIS — J9621 Acute and chronic respiratory failure with hypoxia: Secondary | ICD-10-CM | POA: Diagnosis not present

## 2022-12-08 DIAGNOSIS — Z87891 Personal history of nicotine dependence: Secondary | ICD-10-CM | POA: Diagnosis not present

## 2022-12-08 DIAGNOSIS — Z7982 Long term (current) use of aspirin: Secondary | ICD-10-CM | POA: Diagnosis not present

## 2022-12-08 DIAGNOSIS — J441 Chronic obstructive pulmonary disease with (acute) exacerbation: Secondary | ICD-10-CM | POA: Diagnosis not present

## 2022-12-08 DIAGNOSIS — I10 Essential (primary) hypertension: Secondary | ICD-10-CM | POA: Diagnosis not present

## 2022-12-08 DIAGNOSIS — I160 Hypertensive urgency: Secondary | ICD-10-CM | POA: Diagnosis not present

## 2022-12-08 DIAGNOSIS — D649 Anemia, unspecified: Secondary | ICD-10-CM | POA: Diagnosis not present

## 2022-12-08 DIAGNOSIS — Z9981 Dependence on supplemental oxygen: Secondary | ICD-10-CM | POA: Diagnosis not present

## 2022-12-08 DIAGNOSIS — Z86711 Personal history of pulmonary embolism: Secondary | ICD-10-CM | POA: Diagnosis not present

## 2022-12-08 DIAGNOSIS — M1990 Unspecified osteoarthritis, unspecified site: Secondary | ICD-10-CM | POA: Diagnosis not present

## 2022-12-08 DIAGNOSIS — Z792 Long term (current) use of antibiotics: Secondary | ICD-10-CM | POA: Diagnosis not present

## 2022-12-08 DIAGNOSIS — I700 Atherosclerosis of aorta: Secondary | ICD-10-CM | POA: Diagnosis not present

## 2022-12-08 DIAGNOSIS — Z7951 Long term (current) use of inhaled steroids: Secondary | ICD-10-CM | POA: Diagnosis not present

## 2022-12-08 DIAGNOSIS — I4811 Longstanding persistent atrial fibrillation: Secondary | ICD-10-CM | POA: Diagnosis not present

## 2022-12-08 DIAGNOSIS — Z556 Problems related to health literacy: Secondary | ICD-10-CM | POA: Diagnosis not present

## 2022-12-08 DIAGNOSIS — Z7952 Long term (current) use of systemic steroids: Secondary | ICD-10-CM | POA: Diagnosis not present

## 2022-12-10 ENCOUNTER — Ambulatory Visit (INDEPENDENT_AMBULATORY_CARE_PROVIDER_SITE_OTHER): Payer: Medicare Other | Admitting: Physician Assistant

## 2022-12-10 VITALS — BP 130/82 | HR 76 | Temp 97.7°F | Wt 134.0 lb

## 2022-12-10 DIAGNOSIS — R600 Localized edema: Secondary | ICD-10-CM

## 2022-12-10 DIAGNOSIS — I10 Essential (primary) hypertension: Secondary | ICD-10-CM | POA: Diagnosis not present

## 2022-12-10 DIAGNOSIS — R7989 Other specified abnormal findings of blood chemistry: Secondary | ICD-10-CM

## 2022-12-10 DIAGNOSIS — J449 Chronic obstructive pulmonary disease, unspecified: Secondary | ICD-10-CM | POA: Diagnosis not present

## 2022-12-10 NOTE — Progress Notes (Signed)
Subjective:  Patient ID: Tina Jimenez, female    DOB: [birth year]  Age: 82 y.o. MRN: [id number]  Chief Complaint  Patient presents with   Hypertension    HPI Pt in today for follow up for hypertension.  She states she is taking her medication as directed as far as bp meds (metoprolol 50mg  bid) and has started the lasix 20mg  every day as well but she has not taken med today.  She states her swelling has improved and no further cough. They have made follow up with cardiologist and has appt on 12/25/22. She had elevated BNP and needs further evaluation for CHF Voices no problems or concerns today - denies chest pain or dyspnea     10/20/2022   10:07 AM 06/17/2022    2:49 PM 02/19/2022    1:46 PM 09/15/2021    9:52 AM 05/21/2020    2:13 PM  Depression screen PHQ 2/9  Decreased Interest 2 0 0 0 0  Down, Depressed, Hopeless 0 0 0 0 0  PHQ - 2 Score 2 0 0 0 0  Altered sleeping 0      Tired, decreased energy 0      Change in appetite 0      Feeling bad or failure about yourself  0      Trouble concentrating 2      Moving slowly or fidgety/restless 0      Suicidal thoughts 0      PHQ-9 Score 4      Difficult doing work/chores Not difficult at all            10/16/2019    8:15 AM 09/15/2021    9:52 AM 02/19/2022    1:47 PM 06/17/2022    2:38 PM 10/20/2022   10:07 AM  Fall Risk  Falls in the past year?  0 0 0 Exclusion - non ambulatory  Was there an injury with Fall?  0 0 0 0  Fall Risk Category Calculator  0 0 0 0  Fall Risk Category (Retired)  Low Low Low   (RETIRED) Patient Fall Risk Level Moderate fall risk Low fall risk Low fall risk Low fall risk   Patient at Risk for Falls Due to   No Fall Risks;Impaired balance/gait Impaired balance/gait;Impaired mobility Impaired balance/gait;Impaired mobility  Fall risk Follow up   Falls evaluation completed;Education provided;Falls prevention discussed Falls evaluation completed;Education provided      ROS CONSTITUTIONAL: Negative for chills,  fatigue, fever,  E/N/T: Negative for ear pain, nasal congestion and sore throat.  CARDIOVASCULAR: Negative for chest pain, dizziness, palpitations and pedal edema.  RESPIRATORY: Negative for recent cough and dyspnea.     Current Outpatient Medications:    Ascorbic Acid (VITAMIN C) 500 MG CAPS, Take 1 tablet by mouth daily., Disp: , Rfl:    aspirin EC 81 MG tablet, Take 81 mg by mouth every other day. Swallow whole., Disp: , Rfl:    aspirin-acetaminophen-caffeine (EXCEDRIN MIGRAINE) 250-250-65 MG tablet, Take 1-2 tablets by mouth every 6 (six) hours as needed for headache., Disp: 30 tablet, Rfl: 2   Bempedoic Acid-Ezetimibe (NEXLIZET) 180-10 MG TABS, Take 1 tablet by mouth daily in the afternoon., Disp: 90 tablet, Rfl: 1   Co-Enzyme Q-10 100 MG CAPS, Take 100 mg by mouth daily., Disp: , Rfl:    digoxin (LANOXIN) 0.125 MG tablet, Take 1 tablet (125 mcg total) by mouth daily., Disp: 90 tablet, Rfl: 1   DOCUSATE SODIUM PO, Take 200 mg by mouth  daily., Disp: , Rfl:    famotidine (PEPCID) 20 MG tablet, Take 20 mg by mouth daily., Disp: , Rfl:    ferrous sulfate 325 (65 FE) MG tablet, 1 po qd with breakfast, Disp: 90 tablet, Rfl: 1   fluconazole (DIFLUCAN) 150 MG tablet, Take 1 po qod, Disp: 3 tablet, Rfl: 0   fluticasone (FLONASE) 50 MCG/ACT nasal spray, SPRAY 2 SPRAYS INTO EACH NOSTRIL EVERY DAY, Disp: 48 mL, Rfl: 2   fluticasone furoate-vilanterol (BREO ELLIPTA) 200-25 MCG/ACT AEPB, Inhale 1 puff into the lungs daily., Disp: 1 each, Rfl: 11   furosemide (LASIX) 20 MG tablet, Take 1 tablet (20 mg total) by mouth daily. (Patient not taking: Reported on 11/12/2022), Disp: 90 tablet, Rfl: 1   ipratropium-albuterol (DUONEB) 0.5-2.5 (3) MG/3ML SOLN, USE 1 NEBULE IN NEBULIZER EVERY MORNING, NOON, EVENING, AND BEDTIME., Disp: 1080 mL, Rfl: 1   LACTOBACILLUS PO, Take 1 tablet by mouth daily., Disp: , Rfl:    loratadine (CLARITIN) 10 MG tablet, TAKE 1 TABLET BY MOUTH ONCE DAILY FOR ALLERGIES, Disp: 90  tablet, Rfl: 1   metoprolol tartrate (LOPRESSOR) 50 MG tablet, Take 1 tablet (50 mg total) by mouth 2 (two) times daily., Disp: 180 tablet, Rfl: 0   omeprazole (PRILOSEC) 20 MG capsule, TAKE 1 CAPSULE BY MOUTH EVERY DAY, Disp: 90 capsule, Rfl: 1   triamcinolone cream (KENALOG) 0.1 %, APPLY TO AFFECTED AREA TWICE A DAY, Disp: 30 g, Rfl: 0  Past Medical History:  Diagnosis Date   Abdominal pain 10/13/2019   Abnormal blood chemistry 08/22/2019   Acute appendicitis with localized peritonitis 06/25/2016   Acute blood loss anemia 06/22/2019   Acute bronchitis with COPD (HCC) 08/15/2019   Acute cystitis with hematuria 10/26/2019   Acute embolism and thrombosis of deep veins of left upper extremity (HCC) 07/08/2019   Acute hypoxemic respiratory failure (HCC) 07/08/2019   Acute pain of left shoulder 10/26/2019   Acute posthemorrhagic anemia 07/08/2019   Acute pulmonary embolism without acute cor pulmonale (HCC) 06/21/2019   Acute vaginitis 11/12/2022   Adnexal mass    Anemia 01/08/2020   Arm DVT (deep venous thromboembolism), acute, left (HCC) 06/26/2019   Atrial fibrillation (HCC)    Atypical chest pain 03/04/2022   Bladder mass 10/26/2019   Compression fracture of body of thoracic vertebra (HCC) 2020   COPD (chronic obstructive pulmonary disease) (HCC)    COPD without exacerbation (HCC) 08/15/2019   Coronary artery calcification seen on CT scan 05/23/2020   COVID-19 07/08/2019   Dependence on supplemental oxygen 04/14/2020   Effusion, left ankle 08/03/2019   Effusion, left foot 08/03/2019   Effusion, right ankle 08/03/2019   Effusion, right foot 08/03/2019   Elevated CEA 01/19/2018   Elevated white blood cell count, unspecified 07/11/2019   Encounter for other orthopedic aftercare 07/08/2019   Essential (primary) hypertension 07/08/2019   Gastro-esophageal reflux disease without esophagitis 07/08/2019   History of pulmonary embolism 10/02/2019   Hyperlipemia    Hypertension  08/15/2019   Localized edema 08/22/2019   Mixed dyslipidemia 05/23/2020   Muscle weakness (generalized) 07/08/2019   Osteoporosis    Other disorders of electrolyte and fluid balance, not elsewhere classified 08/10/2019   Other emphysema (HCC) 03/04/2022   Other fatigue 01/08/2020   Oxygen deficiency    Paroxysmal atrial fibrillation (HCC) 06/20/2019   Persistent atrial fibrillation (HCC) 08/22/2019   Pneumonia 05/2019   COVID Pneumonia   Pneumonia due to COVID-19 virus 07/08/2019   Precordial pain 06/20/2019   Pulmonary embolism (HCC)  05/2019   Spinal stenosis, lumbar region without neurogenic claudication 06/14/2019   Unsteadiness on feet 07/08/2019   Vitamin D deficiency 06/22/2019   Wedge compression fracture of unspecified lumbar vertebra, subsequent encounter for fracture with routine healing 07/08/2019   Objective:  PHYSICAL EXAM:   BP 130/82   Pulse 76   Temp 97.7 F (36.5 C)   Wt 134 lb (60.8 kg)   SpO2 96%   BMI 23.74 kg/m    GEN: Well nourished, well developed, in no acute distress - in wheelchair - on oxygen Cardiac: RRR; no murmurs, rubs, or gallops,no edema - Respiratory:  normal respiratory rate and pattern with no distress - normal breath sounds with no rales, rhonchi, wheezes or rubs Skin: warm and dry, no rash  Psych: euthymic mood, appropriate affect and demeanor  Assessment & Plan:    Primary hypertension Continue meds as directed Elevated brain natriuretic peptide (BNP) level Continue lasix Follow up with cardiology as scheduled  Localized edema Continue lasix    Follow-up: Return in about 4 months (around 04/11/2023) for chronic fasting follow-up.  An After Visit Summary was printed and given to the patient.  Jettie Pagan [facility] [phone]

## 2022-12-13 DIAGNOSIS — J449 Chronic obstructive pulmonary disease, unspecified: Secondary | ICD-10-CM

## 2022-12-13 DIAGNOSIS — I10 Essential (primary) hypertension: Secondary | ICD-10-CM

## 2022-12-25 ENCOUNTER — Ambulatory Visit: Payer: Medicare Other | Admitting: Cardiology

## 2023-01-05 ENCOUNTER — Other Ambulatory Visit: Payer: Self-pay | Admitting: Cardiology

## 2023-01-05 ENCOUNTER — Other Ambulatory Visit: Payer: Self-pay | Admitting: Physician Assistant

## 2023-01-05 DIAGNOSIS — E782 Mixed hyperlipidemia: Secondary | ICD-10-CM

## 2023-01-05 DIAGNOSIS — I2510 Atherosclerotic heart disease of native coronary artery without angina pectoris: Secondary | ICD-10-CM

## 2023-01-05 MED ORDER — METOPROLOL TARTRATE 50 MG PO TABS: 50.0000 mg | ORAL_TABLET | Freq: Two times a day (BID) | ORAL | 0 refills | Status: DC

## 2023-01-09 DIAGNOSIS — J449 Chronic obstructive pulmonary disease, unspecified: Secondary | ICD-10-CM | POA: Diagnosis not present

## 2023-01-13 DIAGNOSIS — J449 Chronic obstructive pulmonary disease, unspecified: Secondary | ICD-10-CM | POA: Diagnosis not present

## 2023-01-14 ENCOUNTER — Other Ambulatory Visit: Payer: Self-pay

## 2023-01-14 MED ORDER — IPRATROPIUM-ALBUTEROL 0.5-2.5 (3) MG/3ML IN SOLN: RESPIRATORY_TRACT | 1 refills | Status: DC

## 2023-01-14 NOTE — Telephone Encounter (Signed)
Patient called requesting refill of ipratropium-albuterol be sent to [facility].  Last refilled 09/29/22

## 2023-01-18 ENCOUNTER — Telehealth: Payer: Self-pay

## 2023-01-18 ENCOUNTER — Other Ambulatory Visit: Payer: Self-pay

## 2023-01-18 MED ORDER — METOPROLOL TARTRATE 50 MG PO TABS: 50.0000 mg | ORAL_TABLET | Freq: Two times a day (BID) | ORAL | 0 refills | Status: DC

## 2023-01-18 NOTE — Telephone Encounter (Signed)
Prescription Request  01/18/2023   What is the name of the medication or equipment?  metoprolol tartrate (LOPRESSOR) 50 MG tablet The patient stated that she should have enough of this medication for this week.  Have you contacted your pharmacy to request a refill? No   Which pharmacy would you like this sent to?  [id number] - [place], [place] - [address] [place] [address] Phone: [phone] Fax: [phone]    Patient notified that their request is being sent to the clinical staff for review and that they should receive a response within 2 business days.   Please advise at [facility] [phone]

## 2023-01-21 ENCOUNTER — Other Ambulatory Visit: Payer: Medicare Other

## 2023-01-21 ENCOUNTER — Telehealth: Payer: Medicare Other

## 2023-01-21 ENCOUNTER — Other Ambulatory Visit: Payer: Self-pay

## 2023-01-21 NOTE — Patient Instructions (Signed)
Visit Information  Thank you for taking time to visit with me today. Please don't hesitate to contact me if I can be of assistance to you before our next scheduled telephone appointment.  Following are the goals we discussed today:   Goals Addressed             This Visit's Progress    RNCM Care Management Expected Outcome:  Monitor, Self-Manage, and Reduce Symptoms of Hypertension       Current Barriers:  Chronic Disease Management support and education needs related to Effective management of HTN BP Readings from Last 3 Encounters:  12/10/22 130/82  11/12/22 (!) 148/90  10/20/22 130/72     Planned Interventions: Evaluation of current treatment plan related to hypertension self management and patient's adherence to plan as established by provider. The patient with more stable blood pressures. She denies any acute changes in her HTN or heart health. She states that she has noticed that she is not having the swelling and edema in her feet and legs since taking the Lasix but she is concerned that she may be not be staying hydrated. Talked to her about weighing daily and how to determine if she is retaining or losing too much fluid. Also discussed talking to the cardiologist and pcp about parameters of when to take her fluid pill or not. She is happy she doesn't have the fluid in her feet or cough she was having. Education and support provided.  Provided education to patient re: stroke prevention, s/s of heart attack and stroke; Reviewed prescribed diet heart healthy diet. The patient sometimes has a good appetite and sometimes does not. Education on staying hydrated and on the days she is not eating well to drink protein drinks to help with getting her nutrients. The patient states she does like these types of things and has done this before. She is afraid to drink too much as she has swelling in her feet and legs at times. She was concerned recently and did not take the Lasix but after seeing  the pcp she did take it and now she does not have swelling in her feet or legs. Encouraged the patient to follow the plan of care for effective management of her HTN and heart health.  Reviewed medications with patient and discussed importance of compliance. The patient is compliant with medications and works with the pharm D on a regular basis. States after her last visit she is taking the Lasix as directed.   Discussed plans with patient for ongoing care management follow up and provided patient with direct contact information for care management team; Advised patient, providing education and rationale, to monitor blood pressure daily and record, calling PCP for findings outside established parameters. She monitors her readings at home. Denies any acute changes.;  Reviewed scheduled/upcoming provider appointments including: 04-27-2023 at 0920 am Advised patient to discuss changes in blood pressure or heart health with provider; Provided education on prescribed diet heart healthy diet;  Discussed complications of poorly controlled blood pressure such as heart disease, stroke, circulatory complications, vision complications, kidney impairment, sexual dysfunction;  Screening for signs and symptoms of depression related to chronic disease state;  Assessed social determinant of health barriers;   Symptom Management: Take medications as prescribed   Attend all scheduled provider appointments Call provider office for new concerns or questions  call the Suicide and Crisis Lifeline: 988 call the [place] National Suicide Prevention Lifeline: (425)131-4289 or TTY: 848 741 2763 TTY (985)477-8330) to talk to a trained  counselor call 1-800-273-TALK (toll free, 24 hour hotline) if experiencing a Mental Health or Behavioral Health Crisis  check blood pressure weekly learn about high blood pressure call doctor for signs and symptoms of high blood pressure develop an action plan for high blood pressure keep all  doctor appointments take medications for blood pressure exactly as prescribed report new symptoms to your doctor  Follow Up Plan: Telephone follow up appointment with care management team member scheduled for: 04-01-2023 at 1145 am       RNCM: Care Management  Maintain, Monitor and Self-Manage Symptoms of COPD       Current Barriers:  Care Coordination needs related to cost constraints to inhalers, currently working with the pharm D in a patient with COPD Chronic Disease Management support and education needs related to effective management of COPD  Planned Interventions: Provided patient with basic written and verbal COPD education on self care/management/and exacerbation prevention. The patient states that she has been a little "under the weather" this week and she may have a cold. Had a fever earlier in the week but is feeling better now. She took Tylenol. She denies any other sx and sx of infection. Review of sx and sx to monitor for and changes she may be experiencing to alert the provider.  Advised patient to track and manage COPD triggers. Education on things that may trigger her COPD. She is pacing her activity and knows her limitations. The patient states that the weather sometimes impacts her breathing also. Reflective listening and support  Provided written and verbal instructions on pursed lip breathing and utilized returned demonstration as teach back Provided instruction about proper use of medications used for management of COPD including inhalers. The patient gets stressed out easily over medications and changes. She reads labels and worries about what the medication may do to her. Encouraged the patient to trust the recommendations of the pcp. The patient knows that pharm D support is available if needed. Education and support given.  Advised patient to self assesses COPD action plan zone and make appointment with provider if in the yellow zone for 48 hours without  improvement Advised patient to engage in light exercise as tolerated 3-5 days a week to aid in the the management of COPD Provided education about and advised patient to utilize infection prevention strategies to reduce risk of respiratory infection Discussed the importance of adequate rest and management of fatigue with COPD Screening for signs and symptoms of depression related to chronic disease state  Assessed social determinant of health barriers  Symptom Management: Take medications as prescribed   Attend all scheduled provider appointments Call provider office for new concerns or questions  call the Suicide and Crisis Lifeline: 988 call the [place] National Suicide Prevention Lifeline: (253)604-4593 or TTY: (740)292-4751 TTY (226)440-9250) to talk to a trained counselor call 1-800-273-TALK (toll free, 24 hour hotline) if experiencing a Mental Health or Behavioral Health Crisis  identify and remove indoor air pollutants limit outdoor activity during cold weather listen for public air quality announcements every day do breathing exercises every day develop a rescue plan eliminate symptom triggers at home follow rescue plan if symptoms flare-up  Follow Up Plan: Telephone follow up appointment with care management team member scheduled for: 04-01-2023 at 1145 am           Our next appointment is by telephone on 04-01-2023 at 1145 sm  Please call the care guide team at [phone] if you need to cancel or reschedule your appointment.  If you are experiencing a Mental Health or Behavioral Health Crisis or need someone to talk to, please call the Suicide and Crisis Lifeline: 988 call the [place] National Suicide Prevention Lifeline: 463 582 6508 or TTY: 6673151578 TTY (952)092-2852) to talk to a trained counselor call 1-800-273-TALK (toll free, 24 hour hotline) go to [facility] [address] [place] [phone])   The patient  verbalized understanding of instructions, educational materials, and care plan provided today and DECLINED offer to receive copy of patient instructions, educational materials, and care plan.     Alto Denver RN, MSN, CCM RN Care Manager  [facility]  Ambulatory Care Management  Direct Number: [phone]

## 2023-01-21 NOTE — Patient Outreach (Signed)
Care Management   Visit Note  01/21/2023 Name: Tina Jimenez MRN: [id number] DOB: January 21, 1941  Subjective: Tina Jimenez is a 82 y.o. year old female who is a primary care patient of Tina Jimenez, Tina Jimenez. The Care Management team was consulted for assistance.      Engaged with patient spoke with patient by telephone.    Goals Addressed             This Visit's Progress    RNCM Care Management Expected Outcome:  Monitor, Self-Manage, and Reduce Symptoms of Hypertension       Current Barriers:  Chronic Disease Management support and education needs related to Effective management of HTN BP Readings from Last 3 Encounters:  12/10/22 130/82  11/12/22 (!) 148/90  10/20/22 130/72     Planned Interventions: Evaluation of current treatment plan related to hypertension self management and patient's adherence to plan as established by provider. The patient with more stable blood pressures. She denies any acute changes in her HTN or heart health. She states that she has noticed that she is not having the swelling and edema in her feet and legs since taking the Lasix but she is concerned that she may be not be staying hydrated. Talked to her about weighing daily and how to determine if she is retaining or losing too much fluid. Also discussed talking to the cardiologist and pcp about parameters of when to take her fluid pill or not. She is happy she doesn't have the fluid in her feet or cough she was having. Education and support provided.  Provided education to patient re: stroke prevention, s/s of heart attack and stroke; Reviewed prescribed diet heart healthy diet. The patient sometimes has a good appetite and sometimes does not. Education on staying hydrated and on the days she is not eating well to drink protein drinks to help with getting her nutrients. The patient states she does like these types of things and has done this before. She is afraid to drink too much as she has swelling  in her feet and legs at times. She was concerned recently and did not take the Lasix but after seeing the pcp she did take it and now she does not have swelling in her feet or legs. Encouraged the patient to follow the plan of care for effective management of her HTN and heart health.  Reviewed medications with patient and discussed importance of compliance. The patient is compliant with medications and works with the pharm D on a regular basis. States after her last visit she is taking the Lasix as directed.   Discussed plans with patient for ongoing care management follow up and provided patient with direct contact information for care management team; Advised patient, providing education and rationale, to monitor blood pressure daily and record, calling PCP for findings outside established parameters. She monitors her readings at home. Denies any acute changes.;  Reviewed scheduled/upcoming provider appointments including: 04-27-2023 at 0920 am Advised patient to discuss changes in blood pressure or heart health with provider; Provided education on prescribed diet heart healthy diet;  Discussed complications of poorly controlled blood pressure such as heart disease, stroke, circulatory complications, vision complications, kidney impairment, sexual dysfunction;  Screening for signs and symptoms of depression related to chronic disease state;  Assessed social determinant of health barriers;   Symptom Management: Take medications as prescribed   Attend all scheduled provider appointments Call provider office for new concerns or questions  call the Suicide and Crisis Lifeline:  988 call the [place] National Suicide Prevention Lifeline: 954 463 7492 or TTY: 684-616-9750 TTY 306-608-4486) to talk to a trained counselor call 1-800-273-TALK (toll free, 24 hour hotline) if experiencing a Mental Health or Behavioral Health Crisis  check blood pressure weekly learn about high blood pressure call doctor  for signs and symptoms of high blood pressure develop an action plan for high blood pressure keep all doctor appointments take medications for blood pressure exactly as prescribed report new symptoms to your doctor  Follow Up Plan: Telephone follow up appointment with care management team member scheduled for: 04-01-2023 at 1145 am       RNCM: Care Management  Maintain, Monitor and Self-Manage Symptoms of COPD       Current Barriers:  Care Coordination needs related to cost constraints to inhalers, currently working with the pharm D in a patient with COPD Chronic Disease Management support and education needs related to effective management of COPD  Planned Interventions: Provided patient with basic written and verbal COPD education on self care/management/and exacerbation prevention. The patient states that she has been a little "under the weather" this week and she may have a cold. Had a fever earlier in the week but is feeling better now. She took Tylenol. She denies any other sx and sx of infection. Review of sx and sx to monitor for and changes she may be experiencing to alert the provider.  Advised patient to track and manage COPD triggers. Education on things that may trigger her COPD. She is pacing her activity and knows her limitations. The patient states that the weather sometimes impacts her breathing also. Reflective listening and support  Provided written and verbal instructions on pursed lip breathing and utilized returned demonstration as teach back Provided instruction about proper use of medications used for management of COPD including inhalers. The patient gets stressed out easily over medications and changes. She reads labels and worries about what the medication may do to her. Encouraged the patient to trust the recommendations of the pcp. The patient knows that pharm D support is available if needed. Education and support given.  Advised patient to self assesses COPD action  plan zone and make appointment with provider if in the yellow zone for 48 hours without improvement Advised patient to engage in light exercise as tolerated 3-5 days a week to aid in the the management of COPD Provided education about and advised patient to utilize infection prevention strategies to reduce risk of respiratory infection Discussed the importance of adequate rest and management of fatigue with COPD Screening for signs and symptoms of depression related to chronic disease state  Assessed social determinant of health barriers  Symptom Management: Take medications as prescribed   Attend all scheduled provider appointments Call provider office for new concerns or questions  call the Suicide and Crisis Lifeline: 988 call the [place] National Suicide Prevention Lifeline: 619-387-6189 or TTY: 640-753-4677 TTY 320-250-8987) to talk to a trained counselor call 1-800-273-TALK (toll free, 24 hour hotline) if experiencing a Mental Health or Behavioral Health Crisis  identify and remove indoor air pollutants limit outdoor activity during cold weather listen for public air quality announcements every day do breathing exercises every day develop a rescue plan eliminate symptom triggers at home follow rescue plan if symptoms flare-up  Follow Up Plan: Telephone follow up appointment with care management team member scheduled for: 04-01-2023 at 1145 am           Consent to Services:  Patient was given information about care  management services, agreed to services, and gave verbal consent to participate.   Plan: Telephone follow up appointment with care management team member scheduled for: 04-01-2023 at 1145 am  Alto Denver RN, MSN, CCM RN Care Manager  [facility]  Ambulatory Care Management  Direct Number: [phone]

## 2023-02-01 ENCOUNTER — Ambulatory Visit: Payer: Medicare Other | Admitting: Physician Assistant

## 2023-02-02 ENCOUNTER — Ambulatory Visit (INDEPENDENT_AMBULATORY_CARE_PROVIDER_SITE_OTHER): Payer: Medicare Other | Admitting: Physician Assistant

## 2023-02-02 ENCOUNTER — Encounter: Payer: Self-pay | Admitting: Physician Assistant

## 2023-02-02 VITALS — BP 140/86 | HR 64 | Temp 97.0°F | Ht 63.0 in | Wt 137.0 lb

## 2023-02-02 DIAGNOSIS — L0100 Impetigo, unspecified: Secondary | ICD-10-CM

## 2023-02-02 DIAGNOSIS — L309 Dermatitis, unspecified: Secondary | ICD-10-CM | POA: Diagnosis not present

## 2023-02-02 MED ORDER — TRIAMCINOLONE ACETONIDE 40 MG/ML IJ SUSP
80.0000 mg | Freq: Once | INTRAMUSCULAR | Status: AC
Start: 2023-02-02 — End: 2023-02-02
  Administered 2023-02-02: 80 mg via INTRAMUSCULAR

## 2023-02-02 MED ORDER — PREDNISONE 20 MG PO TABS
ORAL_TABLET | ORAL | 0 refills | Status: DC
Start: 2023-02-02 — End: 2023-05-03

## 2023-02-02 MED ORDER — MUPIROCIN 2 % EX OINT
1.0000 | TOPICAL_OINTMENT | Freq: Two times a day (BID) | CUTANEOUS | 0 refills | Status: DC
Start: 2023-02-02 — End: 2023-07-24

## 2023-02-02 MED ORDER — CEPHALEXIN 500 MG PO CAPS: 500.0000 mg | ORAL_CAPSULE | Freq: Two times a day (BID) | ORAL | 0 refills | Status: DC

## 2023-02-02 NOTE — Progress Notes (Signed)
Acute Office Visit  Subjective:    Patient ID: Tina Jimenez, female    DOB: [birth year], 82 y.o.   MRN: [id number]  Chief Complaint  Patient presents with   Edema    Hands    HPI: Patient is in today for complaints of both hands swollen, red and with rash since last week.  States hands started itching and have progressively worsened.  Has had similar episodes over the past several years.  Cannot recall any exposure to any allergens Denies fever/malaise   Current Outpatient Medications:    Ascorbic Acid (VITAMIN C) 500 MG CAPS, Take 1 tablet by mouth daily., Disp: , Rfl:    aspirin EC 81 MG tablet, Take 81 mg by mouth every other day. Swallow whole., Disp: , Rfl:    aspirin-acetaminophen-caffeine (EXCEDRIN MIGRAINE) 250-250-65 MG tablet, Take 1-2 tablets by mouth every 6 (six) hours as needed for headache., Disp: 30 tablet, Rfl: 2   Bempedoic Acid-Ezetimibe (NEXLIZET) 180-10 MG TABS, Take 1 tablet by mouth daily in the afternoon., Disp: 90 tablet, Rfl: 1   cephALEXin (KEFLEX) 500 MG capsule, Take 1 capsule (500 mg total) by mouth 2 (two) times daily., Disp: 20 capsule, Rfl: 0   Co-Enzyme Q-10 100 MG CAPS, Take 100 mg by mouth daily., Disp: , Rfl:    digoxin (LANOXIN) 0.125 MG tablet, Take 1 tablet (125 mcg total) by mouth daily., Disp: 90 tablet, Rfl: 1   DOCUSATE SODIUM PO, Take 200 mg by mouth daily., Disp: , Rfl:    famotidine (PEPCID) 20 MG tablet, Take 20 mg by mouth daily., Disp: , Rfl:    ferrous sulfate 325 (65 FE) MG tablet, 1 po qd with breakfast, Disp: 90 tablet, Rfl: 1   fluconazole (DIFLUCAN) 150 MG tablet, Take 1 po qod, Disp: 3 tablet, Rfl: 0   fluticasone (FLONASE) 50 MCG/ACT nasal spray, SPRAY 2 SPRAYS INTO EACH NOSTRIL EVERY DAY, Disp: 48 mL, Rfl: 2   fluticasone furoate-vilanterol (BREO ELLIPTA) 200-25 MCG/ACT AEPB, Inhale 1 puff into the lungs daily., Disp: 1 each, Rfl: 11   furosemide (LASIX) 20 MG tablet, Take 1 tablet (20 mg total) by mouth daily., Disp:  90 tablet, Rfl: 1   ipratropium-albuterol (DUONEB) 0.5-2.5 (3) MG/3ML SOLN, USE 1 NEBULE IN NEBULIZER EVERY MORNING, NOON, EVENING, AND BEDTIME., Disp: 1080 mL, Rfl: 1   LACTOBACILLUS PO, Take 1 tablet by mouth daily., Disp: , Rfl:    loratadine (CLARITIN) 10 MG tablet, TAKE 1 TABLET BY MOUTH ONCE DAILY FOR ALLERGIES, Disp: 90 tablet, Rfl: 1   metoprolol tartrate (LOPRESSOR) 50 MG tablet, Take 1 tablet (50 mg total) by mouth 2 (two) times daily., Disp: 180 tablet, Rfl: 0   mupirocin ointment (BACTROBAN) 2 %, Apply 1 Application topically 2 (two) times daily., Disp: 22 g, Rfl: 0   omeprazole (PRILOSEC) 20 MG capsule, TAKE 1 CAPSULE BY MOUTH EVERY DAY, Disp: 90 capsule, Rfl: 1   predniSONE (DELTASONE) 20 MG tablet, 1 po tid for 3 days then 1 po bid for 3 days then 1 po qd for 3 days, Disp: 18 tablet, Rfl: 0   triamcinolone cream (KENALOG) 0.1 %, APPLY TO AFFECTED AREA TWICE A DAY, Disp: 30 g, Rfl: 0  Allergies  Allergen Reactions   Multaq [Dronedarone] Diarrhea   Klonopin [Clonazepam] Other (See Comments)    Pt can't remember reaction   Lipitor [Atorvastatin] Other (See Comments)    Pt could barely lift arms   Simvastatin Other (See Comments)    Pt  can't remember reaction    ROS CONSTITUTIONAL: Negative for chills, fatigue, fever,   CARDIOVASCULAR: Negative for chest pain, dizziness, palpitations and pedal edema.  RESPIRATORY: Negative for recent cough and dyspnea.    MSK: Negative for arthralgias and myalgias.  INTEGUMENTARY: see HPI       Objective:    PHYSICAL EXAM:   BP (!) 140/86 (BP Location: Left Arm, Patient Position: Sitting, Cuff Size: Normal)   Pulse 64   Temp (!) 97 F (36.1 C)   Ht 5\' 3"  (1.6 m)   Wt 137 lb (62.1 kg)   SpO2 98%   BMI 24.27 kg/m    GEN: Well nourished, well developed, in no acute distress  Cardiac: RRR; no murmurs,  Respiratory:  normal respiratory rate and pattern with no distress - normal breath sounds with no rales, rhonchi, wheezes or  rubs GI: normal bowel sounds, no masses or tenderness Skin: blisters and scabs on palms of both hands with swelling and erythema      Assessment & Plan:    Dermatitis -     Triamcinolone Acetonide 80mg  IM -     predniSONE; 1 po tid for 3 days then 1 po bid for 3 days then 1 po qd for 3 days  Dispense: 18 tablet; Refill: 0 -     Mupirocin; Apply 1 Application topically 2 (two) times daily.  Dispense: 22 g; Refill: 0 -     Cephalexin; Take 1 capsule (500 mg total) by mouth 2 (two) times daily.  Dispense: 20 capsule; Refill: 0  Impetigo -     Mupirocin; Apply 1 Application topically 2 (two) times daily.  Dispense: 22 g; Refill: 0 -     Cephalexin; Take 1 capsule (500 mg total) by mouth 2 (two) times daily.  Dispense: 20 capsule; Refill: 0  Recommend zyrtec 10mg  qd   Follow-up: Return for recheck Friday. Or sooner if symptoms change or worsen  An After Visit Summary was printed and given to the patient.  Jettie Pagan [facility] [phone]

## 2023-02-05 ENCOUNTER — Ambulatory Visit: Payer: Medicare Other | Admitting: Physician Assistant

## 2023-02-05 ENCOUNTER — Encounter: Payer: Self-pay | Admitting: Physician Assistant

## 2023-02-05 ENCOUNTER — Other Ambulatory Visit: Payer: Self-pay | Admitting: Cardiology

## 2023-02-05 ENCOUNTER — Other Ambulatory Visit: Payer: Self-pay | Admitting: Physician Assistant

## 2023-02-05 VITALS — BP 138/88 | HR 68 | Temp 98.0°F

## 2023-02-05 DIAGNOSIS — L309 Dermatitis, unspecified: Secondary | ICD-10-CM

## 2023-02-05 DIAGNOSIS — L0100 Impetigo, unspecified: Secondary | ICD-10-CM

## 2023-02-05 DIAGNOSIS — E782 Mixed hyperlipidemia: Secondary | ICD-10-CM

## 2023-02-05 DIAGNOSIS — I2510 Atherosclerotic heart disease of native coronary artery without angina pectoris: Secondary | ICD-10-CM

## 2023-02-05 MED ORDER — FLUCONAZOLE 150 MG PO TABS: 150.0000 mg | ORAL_TABLET | Freq: Every day | ORAL | 1 refills | Status: DC

## 2023-02-05 NOTE — Progress Notes (Signed)
Acute Office Visit  Subjective:    Patient ID: Tina Jimenez, female    DOB: 1941/01/18, 82 y.o.   MRN: [id number]  Chief Complaint  Patient presents with   Dermatitis    HPI: Patient is in today for follow up of dermatitis and swelling of hands.  She is currently on prednisone, keflex and bactroban - had severe redness, swelling and crusting of hands Actually hands look remarkedly better - no further swelling or redness - no areas of impetigo Both hands peeling Denies fever/malaise   Current Outpatient Medications:    Ascorbic Acid (VITAMIN C) 500 MG CAPS, Take 1 tablet by mouth daily., Disp: , Rfl:    aspirin EC 81 MG tablet, Take 81 mg by mouth every other day. Swallow whole., Disp: , Rfl:    aspirin-acetaminophen-caffeine (EXCEDRIN MIGRAINE) 250-250-65 MG tablet, Take 1-2 tablets by mouth every 6 (six) hours as needed for headache., Disp: 30 tablet, Rfl: 2   Bempedoic Acid-Ezetimibe (NEXLIZET) 180-10 MG TABS, Take 1 tablet by mouth daily in the afternoon., Disp: 90 tablet, Rfl: 1   cephALEXin (KEFLEX) 500 MG capsule, Take 1 capsule (500 mg total) by mouth 2 (two) times daily., Disp: 20 capsule, Rfl: 0   Co-Enzyme Q-10 100 MG CAPS, Take 100 mg by mouth daily., Disp: , Rfl:    digoxin (LANOXIN) 0.125 MG tablet, Take 1 tablet (125 mcg total) by mouth daily., Disp: 90 tablet, Rfl: 1   DOCUSATE SODIUM PO, Take 200 mg by mouth daily., Disp: , Rfl:    famotidine (PEPCID) 20 MG tablet, Take 20 mg by mouth daily., Disp: , Rfl:    ferrous sulfate 325 (65 FE) MG tablet, 1 po qd with breakfast, Disp: 90 tablet, Rfl: 1   fluconazole (DIFLUCAN) 150 MG tablet, Take 1 tablet (150 mg total) by mouth daily., Disp: 1 tablet, Rfl: 1   fluticasone (FLONASE) 50 MCG/ACT nasal spray, SPRAY 2 SPRAYS INTO EACH NOSTRIL EVERY DAY, Disp: 48 mL, Rfl: 2   fluticasone furoate-vilanterol (BREO ELLIPTA) 200-25 MCG/ACT AEPB, Inhale 1 puff into the lungs daily., Disp: 1 each, Rfl: 11   furosemide (LASIX) 20  MG tablet, Take 1 tablet (20 mg total) by mouth daily., Disp: 90 tablet, Rfl: 1   ipratropium-albuterol (DUONEB) 0.5-2.5 (3) MG/3ML SOLN, USE 1 NEBULE IN NEBULIZER EVERY MORNING, NOON, EVENING, AND BEDTIME., Disp: 1080 mL, Rfl: 1   LACTOBACILLUS PO, Take 1 tablet by mouth daily., Disp: , Rfl:    loratadine (CLARITIN) 10 MG tablet, TAKE 1 TABLET BY MOUTH ONCE DAILY FOR ALLERGIES, Disp: 90 tablet, Rfl: 1   metoprolol tartrate (LOPRESSOR) 50 MG tablet, Take 1 tablet (50 mg total) by mouth 2 (two) times daily., Disp: 180 tablet, Rfl: 0   mupirocin ointment (BACTROBAN) 2 %, Apply 1 Application topically 2 (two) times daily., Disp: 22 g, Rfl: 0   omeprazole (PRILOSEC) 20 MG capsule, TAKE 1 CAPSULE BY MOUTH EVERY DAY, Disp: 90 capsule, Rfl: 1   predniSONE (DELTASONE) 20 MG tablet, 1 po tid for 3 days then 1 po bid for 3 days then 1 po qd for 3 days, Disp: 18 tablet, Rfl: 0   triamcinolone cream (KENALOG) 0.1 %, APPLY TO AFFECTED AREA TWICE A DAY, Disp: 30 g, Rfl: 0  Allergies  Allergen Reactions   Multaq [Dronedarone] Diarrhea   Klonopin [Clonazepam] Other (See Comments)    Pt can't remember reaction   Lipitor [Atorvastatin] Other (See Comments)    Pt could barely lift arms   Simvastatin  Other (See Comments)    Pt can't remember reaction    ROS CONSTITUTIONAL: Negative for chills, fatigue, fever,  CARDIOVASCULAR: Negative for chest pain, RESPIRATORY: Negative for recent cough and dyspnea.   INTEGUMENTARY: see HPI     Objective:    PHYSICAL EXAM:   BP 138/88 (BP Location: Left Arm, Patient Position: Sitting, Cuff Size: Large)   Pulse 68   Temp 98 F (36.7 C) (Temporal)   SpO2 100%    GEN: Well nourished, well developed, in no acute distress Cardiac: RRR; Respiratory:  normal respiratory rate and pattern with no distress - normal breath sounds with no rales, rhonchi, wheezes or rubs Skin: both hands peeling but no further redness or swelling      Assessment & Plan:     Dermatitis Continue current meds Follow up as needed Other orders -     Fluconazole; Take 1 tablet (150 mg total) by mouth daily.  Dispense: 1 tablet; Refill: 1     Follow-up: Return if symptoms worsen or fail to improve.  An After Visit Summary was printed and given to the patient.  Jettie Pagan [facility] [phone]

## 2023-02-11 ENCOUNTER — Other Ambulatory Visit: Payer: Self-pay

## 2023-02-13 ENCOUNTER — Other Ambulatory Visit: Payer: Self-pay | Admitting: Physician Assistant

## 2023-02-13 DIAGNOSIS — R600 Localized edema: Secondary | ICD-10-CM

## 2023-02-17 ENCOUNTER — Encounter: Payer: Self-pay | Admitting: Cardiology

## 2023-02-17 ENCOUNTER — Ambulatory Visit: Payer: Medicare Other | Attending: Cardiology | Admitting: Cardiology

## 2023-02-17 VITALS — BP 145/76 | HR 80 | Ht 63.0 in | Wt 138.2 lb

## 2023-02-17 DIAGNOSIS — J431 Panlobular emphysema: Secondary | ICD-10-CM

## 2023-02-17 DIAGNOSIS — I2510 Atherosclerotic heart disease of native coronary artery without angina pectoris: Secondary | ICD-10-CM | POA: Diagnosis not present

## 2023-02-17 DIAGNOSIS — Z9981 Dependence on supplemental oxygen: Secondary | ICD-10-CM | POA: Diagnosis not present

## 2023-02-17 DIAGNOSIS — E782 Mixed hyperlipidemia: Secondary | ICD-10-CM | POA: Diagnosis not present

## 2023-02-17 DIAGNOSIS — I10 Essential (primary) hypertension: Secondary | ICD-10-CM

## 2023-02-17 DIAGNOSIS — I4891 Unspecified atrial fibrillation: Secondary | ICD-10-CM

## 2023-02-17 NOTE — Progress Notes (Signed)
Cardiology Office Note:    Date:  02/17/2023   ID:  Tina Jimenez, DOB [birth year], MRN [id number]  PCP:  Marianne Sofia, PA-C  Cardiologist:  Garwin Brothers, MD   Referring MD: Marianne Sofia, PA-C    ASSESSMENT:    1. Atrial fibrillation, unspecified type (HCC)   2. Essential (primary) hypertension   3. Mixed dyslipidemia   4. Coronary artery calcification seen on CT scan   5. Panlobular emphysema (HCC)   6. Supplemental oxygen dependent    PLAN:    In order of problems listed above:  Coronary artery calcification: Secondary prevention stressed with the patient.  Importance of compliance with diet medication stressed and she vocalized understanding. COPD primary oxygen: She is very meticulous about oxygen use. Essential hypertension: Blood pressure is stable and diet was emphasized.  Lifestyle modification urged. Atrial fibrillation: Stable.  Not on anticoagulation because of unstable gait.  She is a frail lady. Mixed sleep: On lipid-lowering medications followed by primary care.  Goal LDL must be less than 60. Patient will be seen in follow-up appointment in 6 months or earlier if the patient has any concerns.    Medication Adjustments/Labs and Tests Ordered: Current medicines are reviewed at length with the patient today.  Concerns regarding medicines are outlined above.  No orders of the defined types were placed in this encounter.  No orders of the defined types were placed in this encounter.    No chief complaint on file.    History of Present Illness:    Tina Jimenez is a 82 y.o. female.  Patient has past medical history of coronary artery calcification and plaque and essential hypertension and dyslipidemia.  She has COPD on supplemental oxygen.  She denies any problems at this time and takes care of activities of daily living.  She uses oxygen.  She is brought in by her son on a wheelchair.  At the time of my evaluation, the patient is alert awake  oriented and in no distress.  Past Medical History:  Diagnosis Date   Abdominal pain 10/13/2019   Abnormal blood chemistry 08/22/2019   Acute appendicitis with localized peritonitis 06/25/2016   Acute blood loss anemia 06/22/2019   Acute bronchitis with COPD (HCC) 08/15/2019   Acute cystitis with hematuria 10/26/2019   Acute embolism and thrombosis of deep veins of left upper extremity (HCC) 07/08/2019   Acute hypoxemic respiratory failure (HCC) 07/08/2019   Acute pain of left shoulder 10/26/2019   Acute posthemorrhagic anemia 07/08/2019   Acute pulmonary embolism without acute cor pulmonale (HCC) 06/21/2019   Acute vaginitis 11/12/2022   Adnexal mass    Anemia 01/08/2020   Arm DVT (deep venous thromboembolism), acute, left (HCC) 06/26/2019   Atrial fibrillation (HCC)    Atypical chest pain 03/04/2022   Bladder mass 10/26/2019   Compression fracture of body of thoracic vertebra (HCC) 2020   COPD (chronic obstructive pulmonary disease) (HCC)    COPD without exacerbation (HCC) 08/15/2019   Coronary artery calcification seen on CT scan 05/23/2020   COVID-19 07/08/2019   Dependence on supplemental oxygen 04/14/2020   Effusion, left ankle 08/03/2019   Effusion, left foot 08/03/2019   Effusion, right ankle 08/03/2019   Effusion, right foot 08/03/2019   Elevated CEA 01/19/2018   Elevated white blood cell count, unspecified 07/11/2019   Encounter for other orthopedic aftercare 07/08/2019   Essential (primary) hypertension 07/08/2019   Gastro-esophageal reflux disease without esophagitis 07/08/2019   History of pulmonary embolism 10/02/2019  Hyperlipemia    Hypertension 08/15/2019   Localized edema 08/22/2019   Mixed dyslipidemia 05/23/2020   Muscle weakness (generalized) 07/08/2019   Osteoporosis    Other disorders of electrolyte and fluid balance, not elsewhere classified 08/10/2019   Other emphysema (HCC) 03/04/2022   Other fatigue 01/08/2020   Oxygen deficiency     Paroxysmal atrial fibrillation (HCC) 06/20/2019   Persistent atrial fibrillation (HCC) 08/22/2019   Pneumonia 05/2019   COVID Pneumonia   Pneumonia due to COVID-19 virus 07/08/2019   Precordial pain 06/20/2019   Pulmonary embolism (HCC) 05/2019   Spinal stenosis, lumbar region without neurogenic claudication 06/14/2019   Unsteadiness on feet 07/08/2019   Vitamin D deficiency 06/22/2019   Wedge compression fracture of unspecified lumbar vertebra, subsequent encounter for fracture with routine healing 07/08/2019    Past Surgical History:  Procedure Laterality Date   APPENDECTOMY     KYPHOPLASTY      Current Medications: Current Meds  Medication Sig   Ascorbic Acid (VITAMIN C) 500 MG CAPS Take 1 tablet by mouth daily.   aspirin EC 81 MG tablet Take 81 mg by mouth every other day. Swallow whole.   aspirin-acetaminophen-caffeine (EXCEDRIN MIGRAINE) 250-250-65 MG tablet Take 1-2 tablets by mouth every 6 (six) hours as needed for headache.   carvedilol (COREG) 6.25 MG tablet Take 6.25 mg by mouth 2 (two) times daily.   cephALEXin (KEFLEX) 500 MG capsule Take 1 capsule (500 mg total) by mouth 2 (two) times daily.   Co-Enzyme Q-10 100 MG CAPS Take 100 mg by mouth daily.   digoxin (LANOXIN) 0.125 MG tablet TAKE 1 TABLET BY MOUTH EVERY DAY   DOCUSATE SODIUM PO Take 200 mg by mouth daily.   famotidine (PEPCID) 20 MG tablet Take 20 mg by mouth daily.   ferrous sulfate 325 (65 FE) MG tablet 1 po qd with breakfast   fluconazole (DIFLUCAN) 150 MG tablet Take 1 tablet (150 mg total) by mouth daily.   fluticasone (FLONASE) 50 MCG/ACT nasal spray SPRAY 2 SPRAYS INTO EACH NOSTRIL EVERY DAY   fluticasone furoate-vilanterol (BREO ELLIPTA) 200-25 MCG/ACT AEPB Inhale 1 puff into the lungs daily.   furosemide (LASIX) 20 MG tablet TAKE 1 TABLET BY MOUTH EVERY DAY   ipratropium-albuterol (DUONEB) 0.5-2.5 (3) MG/3ML SOLN USE 1 NEBULE IN NEBULIZER EVERY MORNING, NOON, EVENING, AND BEDTIME.   LACTOBACILLUS PO  Take 1 tablet by mouth daily.   loratadine (CLARITIN) 10 MG tablet TAKE 1 TABLET BY MOUTH ONCE DAILY FOR ALLERGIES   metoprolol tartrate (LOPRESSOR) 50 MG tablet Take 1 tablet (50 mg total) by mouth 2 (two) times daily.   mupirocin ointment (BACTROBAN) 2 % Apply 1 Application topically 2 (two) times daily.   NEXLIZET 180-10 MG TABS TAKE 1 TABLET BY MOUTH DAILY IN THE AFTERNOON   omeprazole (PRILOSEC) 20 MG capsule TAKE 1 CAPSULE BY MOUTH EVERY DAY   predniSONE (DELTASONE) 20 MG tablet 1 po tid for 3 days then 1 po bid for 3 days then 1 po qd for 3 days   triamcinolone cream (KENALOG) 0.1 % APPLY TO AFFECTED AREA TWICE A DAY     Allergies:   Multaq [dronedarone], Klonopin [clonazepam], Lipitor [atorvastatin], and Simvastatin   Social History   Socioeconomic History   Marital status: Widowed    Spouse name: Not on file   Number of children: 4   Years of education: Not on file   Highest education level: Not on file  Occupational History   Occupation: retired  Tobacco Use  Smoking status: Former    Current packs/day: 0.00    Types: Cigarettes    Quit date: 11/01/2018    Years since quitting: 4.2   Smokeless tobacco: Never  Vaping Use   Vaping status: Never Used  Substance and Sexual Activity   Alcohol use: Never   Drug use: Never   Sexual activity: Not on file  Other Topics Concern   Not on file  Social History Narrative   Not on file   Social Determinants of Health   Financial Resource Strain: High Risk (07/29/2022)   Overall Financial Resource Strain (CARDIA)    Difficulty of Paying Living Expenses: Hard  Food Insecurity: No Food Insecurity (06/17/2022)   Hunger Vital Sign    Worried About Running Out of Food in the Last Year: Never true    Ran Out of Food in the Last Year: Never true  Transportation Needs: No Transportation Needs (07/29/2022)   PRAPARE - Administrator, Civil Service (Medical): No    Lack of Transportation (Non-Medical): No  Physical  Activity: Inactive (06/17/2022)   Exercise Vital Sign    Days of Exercise per Week: 0 days    Minutes of Exercise per Session: 0 min  Stress: No Stress Concern Present (06/17/2022)   [employer] of Occupational Health - Occupational Stress Questionnaire    Feeling of Stress : Only a little  Social Connections: Socially Isolated (06/17/2022)   Social Connection and Isolation Panel [NHANES]    Frequency of Communication with Friends and Family: More than three times a week    Frequency of Social Gatherings with Friends and Family: More than three times a week    Attends Religious Services: Never    Database administrator or Organizations: No    Attends Banker Meetings: Never    Marital Status: Widowed     Family History: The patient's family history includes Breast cancer in her sister; Lung cancer in her daughter and sister.  ROS:   Please see the history of present illness.    All other systems reviewed and are negative.  EKGs/Labs/Other Studies Reviewed:    The following studies were reviewed today: I discussed my findings with the patient at length.   Recent Labs: 10/20/2022: Hemoglobin 11.9; NT-Pro BNP 5,327; Platelets 274; TSH 1.520 11/12/2022: ALT 20; BUN 12; Creatinine, Ser 0.59; Potassium 5.0; Sodium 142  Recent Lipid Panel    Component Value Date/Time   CHOL 132 10/20/2022 1057   TRIG 92 10/20/2022 1057   HDL 58 10/20/2022 1057   CHOLHDL 2.3 10/20/2022 1057   LDLCALC 57 10/20/2022 1057    Physical Exam:    VS:  BP (!) 145/76   Pulse 80   Ht 5\' 3"  (1.6 m)   Wt 138 lb 3.2 oz (62.7 kg)   SpO2 98%   BMI 24.48 kg/m     Wt Readings from Last 3 Encounters:  02/17/23 138 lb 3.2 oz (62.7 kg)  02/02/23 137 lb (62.1 kg)  12/10/22 134 lb (60.8 kg)     GEN: Patient is in no acute distress HEENT: Normal NECK: No JVD; No carotid bruits LYMPHATICS: No lymphadenopathy CARDIAC: Hear sounds irregular, 2/6 systolic murmur at the apex. RESPIRATORY:  Clear  to auscultation without rales, wheezing or rhonchi  ABDOMEN: Soft, non-tender, non-distended MUSCULOSKELETAL:  No edema; No deformity  SKIN: Warm and dry NEUROLOGIC:  Alert and oriented x 3 PSYCHIATRIC:  Normal affect   Signed, Garwin Brothers, MD  02/17/2023  1:53 PM    [facility]

## 2023-02-17 NOTE — Patient Instructions (Addendum)
Medication Instructions:  Your physician recommends that you continue on your current medications as directed. Please refer to the Current Medication list given to you today.  *If you need a refill on your cardiac medications before your next appointment, please call your pharmacy*   Lab Work: None ordered If you have labs (blood work) drawn today and your tests are completely normal, you will receive your results only by: MyChart Message (if you have MyChart) OR A paper copy in the mail If you have any lab test that is abnormal or we need to change your treatment, we will call you to review the results.   Testing/Procedures: Your physician has requested that you have an echocardiogram. Echocardiography is a painless test that uses sound waves to create images of your heart. It provides your doctor with information about the size and shape of your heart and how well your heart's chambers and valves are working. This procedure takes approximately one hour. There are no restrictions for this procedure. Please do NOT wear cologne, perfume, aftershave, or lotions (deodorant is allowed). Please arrive 15 minutes prior to your appointment time.     Follow-Up: At [facility], you and your health needs are our priority.  As part of our continuing mission to provide you with exceptional heart care, we have created designated Provider Care Teams.  These Care Teams include your primary Cardiologist (physician) and Advanced Practice Providers (APPs -  Physician Assistants and Nurse Practitioners) who all work together to provide you with the care you need, when you need it.  We recommend signing up for the patient portal called "MyChart".  Sign up information is provided on this After Visit Summary.  MyChart is used to connect with patients for Virtual Visits (Telemedicine).  Patients are able to view lab/test results, encounter notes, upcoming appointments, etc.  Non-urgent messages can be sent to  your provider as well.   To learn more about what you can do with MyChart, go to [email or web address].    Your next appointment:   9 month(s)  The format for your next appointment:   In Person  Provider:   Rajan Revankar, MD   Other Instructions Echocardiogram An echocardiogram is a test that uses sound waves (ultrasound) to produce images of the heart. Images from an echocardiogram can provide important information about: Heart size and shape. The size and thickness and movement of your heart's walls. Heart muscle function and strength. Heart valve function or if you have stenosis. Stenosis is when the heart valves are too narrow. If blood is flowing backward through the heart valves (regurgitation). A tumor or infectious growth around the heart valves. Areas of heart muscle that are not working well because of poor blood flow or injury from a heart attack. Aneurysm detection. An aneurysm is a weak or damaged part of an artery wall. The wall bulges out from the normal force of blood pumping through the body. Tell a health care provider about: Any allergies you have. All medicines you are taking, including vitamins, herbs, eye drops, creams, and over-the-counter medicines. Any blood disorders you have. Any surgeries you have had. Any medical conditions you have. Whether you are pregnant or may be pregnant. What are the risks? Generally, this is a safe test. However, problems may occur, including an allergic reaction to dye (contrast) that may be used during the test. What happens before the test? No specific preparation is needed. You may eat and drink normally. What happens during the test? You will   take off your clothes from the waist up and put on a hospital gown. Electrodes or electrocardiogram (ECG)patches may be placed on your chest. The electrodes or patches are then connected to a device that monitors your heart rate and rhythm. You will lie down on a table for an  ultrasound exam. A gel will be applied to your chest to help sound waves pass through your skin. A handheld device, called a transducer, will be pressed against your chest and moved over your heart. The transducer produces sound waves that travel to your heart and bounce back (or "echo" back) to the transducer. These sound waves will be captured in real-time and changed into images of your heart that can be viewed on a video monitor. The images will be recorded on a computer and reviewed by your health care provider. You may be asked to change positions or hold your breath for a short time. This makes it easier to get different views or better views of your heart. In some cases, you may receive contrast through an IV in one of your veins. This can improve the quality of the pictures from your heart. The procedure may vary among health care providers and hospitals.   What can I expect after the test? You may return to your normal, everyday life, including diet, activities, and medicines, unless your health care provider tells you not to do that. Follow these instructions at home: It is up to you to get the results of your test. Ask your health care provider, or the department that is doing the test, when your results will be ready. Keep all follow-up visits. This is important. Summary An echocardiogram is a test that uses sound waves (ultrasound) to produce images of the heart. Images from an echocardiogram can provide important information about the size and shape of your heart, heart muscle function, heart valve function, and other possible heart problems. You do not need to do anything to prepare before this test. You may eat and drink normally. After the echocardiogram is completed, you may return to your normal, everyday life, unless your health care provider tells you not to do that. This information is not intended to replace advice given to you by your health care provider. Make sure you  discuss any questions you have with your health care provider. Document Revised: 01/23/2020 Document Reviewed: 01/23/2020 Elsevier Patient Education  2021 [employer]   Important Information About Sugar        

## 2023-02-18 ENCOUNTER — Ambulatory Visit (INDEPENDENT_AMBULATORY_CARE_PROVIDER_SITE_OTHER): Payer: Medicare Other

## 2023-02-18 VITALS — Ht 63.0 in | Wt 138.0 lb

## 2023-02-18 DIAGNOSIS — Z0000 Encounter for general adult medical examination without abnormal findings: Secondary | ICD-10-CM

## 2023-02-18 NOTE — Progress Notes (Signed)
Subjective:   Tina Jimenez is a 82 y.o. female who presents for Medicare Annual (Subsequent) preventive examination.  Visit Complete: Virtual  I connected with  [name] on 02/18/23 by a audio enabled telemedicine application and verified that I am speaking with the correct person using two identifiers.  Patient Location: Home  Provider Location: Home Office  I discussed the limitations of evaluation and management by telemedicine. The patient expressed understanding and agreed to proceed.     Review of Systems    Vital Signs: Unable to obtain new vitals due to this being a telehealth visit.  Cardiac Risk Factors include: advanced age (>70men, >60 women);hypertension     Objective:    Today's Vitals   02/18/23 1312  Weight: 138 lb (62.6 kg)  Height: 5\' 3"  (1.6 m)   Body mass index is 24.45 kg/m.     02/18/2023    1:25 PM 02/19/2022    1:47 PM 10/13/2019   11:37 AM  Advanced Directives  Does Patient Have a Medical Advance Directive? Yes Yes No  Type of Estate agent of [place];Living will Living will   Copy of Healthcare Power of Attorney in Chart? No - copy requested    Would patient like information on creating a medical advance directive?   No - Patient declined    Current Medications (verified) Outpatient Encounter Medications as of 02/18/2023  Medication Sig   Ascorbic Acid (VITAMIN C) 500 MG CAPS Take 1 tablet by mouth daily.   aspirin EC 81 MG tablet Take 81 mg by mouth every other day. Swallow whole.   aspirin-acetaminophen-caffeine (EXCEDRIN MIGRAINE) 250-250-65 MG tablet Take 1-2 tablets by mouth every 6 (six) hours as needed for headache.   carvedilol (COREG) 6.25 MG tablet Take 6.25 mg by mouth 2 (two) times daily.   cephALEXin (KEFLEX) 500 MG capsule Take 1 capsule (500 mg total) by mouth 2 (two) times daily.   Co-Enzyme Q-10 100 MG CAPS Take 100 mg by mouth daily.   digoxin (LANOXIN) 0.125 MG tablet TAKE 1 TABLET BY  MOUTH EVERY DAY   DOCUSATE SODIUM PO Take 200 mg by mouth daily.   famotidine (PEPCID) 20 MG tablet Take 20 mg by mouth daily.   ferrous sulfate 325 (65 FE) MG tablet 1 po qd with breakfast   fluconazole (DIFLUCAN) 150 MG tablet Take 1 tablet (150 mg total) by mouth daily.   fluticasone (FLONASE) 50 MCG/ACT nasal spray SPRAY 2 SPRAYS INTO EACH NOSTRIL EVERY DAY   fluticasone furoate-vilanterol (BREO ELLIPTA) 200-25 MCG/ACT AEPB Inhale 1 puff into the lungs daily.   furosemide (LASIX) 20 MG tablet TAKE 1 TABLET BY MOUTH EVERY DAY   ipratropium-albuterol (DUONEB) 0.5-2.5 (3) MG/3ML SOLN USE 1 NEBULE IN NEBULIZER EVERY MORNING, NOON, EVENING, AND BEDTIME.   LACTOBACILLUS PO Take 1 tablet by mouth daily.   loratadine (CLARITIN) 10 MG tablet TAKE 1 TABLET BY MOUTH ONCE DAILY FOR ALLERGIES   metoprolol tartrate (LOPRESSOR) 50 MG tablet Take 1 tablet (50 mg total) by mouth 2 (two) times daily.   mupirocin ointment (BACTROBAN) 2 % Apply 1 Application topically 2 (two) times daily.   NEXLIZET 180-10 MG TABS TAKE 1 TABLET BY MOUTH DAILY IN THE AFTERNOON   omeprazole (PRILOSEC) 20 MG capsule TAKE 1 CAPSULE BY MOUTH EVERY DAY   predniSONE (DELTASONE) 20 MG tablet 1 po tid for 3 days then 1 po bid for 3 days then 1 po qd for 3 days   triamcinolone cream (  KENALOG) 0.1 % APPLY TO AFFECTED AREA TWICE A DAY   No facility-administered encounter medications on file as of 02/18/2023.    Allergies (verified) Multaq [dronedarone], Klonopin [clonazepam], Lipitor [atorvastatin], and Simvastatin   History: Past Medical History:  Diagnosis Date   Abdominal pain 10/13/2019   Abnormal blood chemistry 08/22/2019   Acute appendicitis with localized peritonitis 06/25/2016   Acute blood loss anemia 06/22/2019   Acute bronchitis with COPD (HCC) 08/15/2019   Acute cystitis with hematuria 10/26/2019   Acute embolism and thrombosis of deep veins of left upper extremity (HCC) 07/08/2019   Acute hypoxemic respiratory  failure (HCC) 07/08/2019   Acute pain of left shoulder 10/26/2019   Acute posthemorrhagic anemia 07/08/2019   Acute pulmonary embolism without acute cor pulmonale (HCC) 06/21/2019   Acute vaginitis 11/12/2022   Adnexal mass    Anemia 01/08/2020   Arm DVT (deep venous thromboembolism), acute, left (HCC) 06/26/2019   Atrial fibrillation (HCC)    Atypical chest pain 03/04/2022   Bladder mass 10/26/2019   Compression fracture of body of thoracic vertebra (HCC) 2020   COPD (chronic obstructive pulmonary disease) (HCC)    COPD without exacerbation (HCC) 08/15/2019   Coronary artery calcification seen on CT scan 05/23/2020   COVID-19 07/08/2019   Dependence on supplemental oxygen 04/14/2020   Effusion, left ankle 08/03/2019   Effusion, left foot 08/03/2019   Effusion, right ankle 08/03/2019   Effusion, right foot 08/03/2019   Elevated CEA 01/19/2018   Elevated white blood cell count, unspecified 07/11/2019   Encounter for other orthopedic aftercare 07/08/2019   Essential (primary) hypertension 07/08/2019   Gastro-esophageal reflux disease without esophagitis 07/08/2019   History of pulmonary embolism 10/02/2019   Hyperlipemia    Hypertension 08/15/2019   Localized edema 08/22/2019   Mixed dyslipidemia 05/23/2020   Muscle weakness (generalized) 07/08/2019   Osteoporosis    Other disorders of electrolyte and fluid balance, not elsewhere classified 08/10/2019   Other emphysema (HCC) 03/04/2022   Other fatigue 01/08/2020   Oxygen deficiency    Paroxysmal atrial fibrillation (HCC) 06/20/2019   Persistent atrial fibrillation (HCC) 08/22/2019   Pneumonia 05/2019   COVID Pneumonia   Pneumonia due to COVID-19 virus 07/08/2019   Precordial pain 06/20/2019   Pulmonary embolism (HCC) 05/2019   Spinal stenosis, lumbar region without neurogenic claudication 06/14/2019   Unsteadiness on feet 07/08/2019   Vitamin D deficiency 06/22/2019   Wedge compression fracture of unspecified lumbar  vertebra, subsequent encounter for fracture with routine healing 07/08/2019   Past Surgical History:  Procedure Laterality Date   APPENDECTOMY     KYPHOPLASTY     Family History  Problem Relation Age of Onset   Breast cancer Sister    Lung cancer Sister    Lung cancer Daughter    Social History   Socioeconomic History   Marital status: Widowed    Spouse name: Not on file   Number of children: 4   Years of education: Not on file   Highest education level: Not on file  Occupational History   Occupation: retired  Tobacco Use   Smoking status: Former    Current packs/day: 0.00    Types: Cigarettes    Quit date: 11/01/2018    Years since quitting: 4.3   Smokeless tobacco: Never  Vaping Use   Vaping status: Never Used  Substance and Sexual Activity   Alcohol use: Never   Drug use: Never   Sexual activity: Not on file  Other Topics Concern   Not  on file  Social History Narrative   Not on file   Social Determinants of Health   Financial Resource Strain: Low Risk  (02/18/2023)   Overall Financial Resource Strain (CARDIA)    Difficulty of Paying Living Expenses: Not hard at all  Food Insecurity: No Food Insecurity (02/18/2023)   Hunger Vital Sign    Worried About Running Out of Food in the Last Year: Never true    Ran Out of Food in the Last Year: Never true  Transportation Needs: No Transportation Needs (02/18/2023)   PRAPARE - Administrator, Civil Service (Medical): No    Lack of Transportation (Non-Medical): No  Physical Activity: Sufficiently Active (02/18/2023)   Exercise Vital Sign    Days of Exercise per Week: 5 days    Minutes of Exercise per Session: 30 min  Stress: No Stress Concern Present (02/18/2023)   [employer] of Occupational Health - Occupational Stress Questionnaire    Feeling of Stress : Not at all  Social Connections: Socially Isolated (02/18/2023)   Social Connection and Isolation Panel [NHANES]    Frequency of Communication with Friends  and Family: More than three times a week    Frequency of Social Gatherings with Friends and Family: More than three times a week    Attends Religious Services: Never    Database administrator or Organizations: No    Attends Banker Meetings: Never    Marital Status: Widowed    Tobacco Counseling Counseling given: Not Answered   Clinical Intake:  Pre-visit preparation completed: Yes  Pain : No/denies pain     BMI - recorded: 24.45 Nutritional Status: BMI of 19-24  Normal Nutritional Risks: None Diabetes: No  How often do you need to have someone help you when you read instructions, pamphlets, or other written materials from your doctor or pharmacy?: 3 - Sometimes (Family Assist)  Interpreter Needed?: No  Information entered by :: Theresa Mulligan LPN   Activities of Daily Living    02/18/2023    1:23 PM 02/19/2022    1:48 PM  In your present state of health, do you have any difficulty performing the following activities:  Hearing? 0 0  Vision? 0 1  Difficulty concentrating or making decisions? 0 0  Walking or climbing stairs? 1 0  Comment Uses a walker   Dressing or bathing? 1 0  Comment Family assist   Doing errands, shopping? 1 0  Comment Family Advice worker and eating ? N N  Using the Toilet? N N  In the past six months, have you accidently leaked urine? N N  Do you have problems with loss of bowel control? N N  Managing your Medications? N N  Managing your Finances? Y N  Comment Family assist   Housekeeping or managing your Housekeeping? [place]  Comment FDamily Assist     Patient Care Team: Jannifer Hick as PCP - General (Physician Assistant) Marlowe Sax, RN as Case Manager (General Practice)  Indicate any recent Medical Services you may have received from other than [facility] providers in the past year (date may be approximate).     Assessment:   This is a routine wellness examination for Elvida.  Hearing/Vision  screen Hearing Screening - Comments:: Denies hearing difficulties   Vision Screening - Comments:: Wears rx glasses - up to date with routine eye exams with  Deferred   Goals Addressed  This Visit's Progress     Stay Healthy (pt-stated)         Depression Screen    02/18/2023    1:22 PM 10/20/2022   10:07 AM 06/17/2022    2:49 PM 02/19/2022    1:46 PM 09/15/2021    9:52 AM 05/21/2020    2:13 PM 04/08/2020   10:31 AM  PHQ 2/9 Scores  PHQ - 2 Score 0 2 0 0 0 0 0  PHQ- 9 Score  4         Fall Risk    02/18/2023    1:25 PM 10/20/2022   10:07 AM 06/17/2022    2:38 PM 02/19/2022    1:47 PM 09/15/2021    9:52 AM  Fall Risk   Falls in the past year? 0 Exclusion - non ambulatory 0 0 0  Number falls in past yr: 0 0 0 0 0  Injury with Fall? 0 0 0 0 0  Risk for fall due to : No Fall Risks Impaired balance/gait;Impaired mobility Impaired balance/gait;Impaired mobility No Fall Risks;Impaired balance/gait   Follow up Falls prevention discussed  Falls evaluation completed;Education provided Falls evaluation completed;Education provided;Falls prevention discussed     MEDICARE RISK AT HOME: Medicare Risk at Home Any stairs in or around the home?: Yes If so, are there any without handrails?: No Home free of loose throw rugs in walkways, pet beds, electrical cords, etc?: Yes Adequate lighting in your home to reduce risk of falls?: Yes Life alert?: Yes Use of a cane, walker or w/c?: Yes Grab bars in the bathroom?: Yes Shower chair or bench in shower?: Yes Elevated toilet seat or a handicapped toilet?: Yes  TIMED UP AND GO:  Was the test performed?  No    Cognitive Function:        02/18/2023    1:25 PM 02/19/2022    1:50 PM  6CIT Screen  What Year? 0 points 0 points  What month? 0 points 0 points  What time? 0 points 0 points  Count back from 20 0 points 0 points  Months in reverse 0 points 0 points  Repeat phrase 0 points 0 points  Total Score 0 points 0 points     Immunizations Immunization History  Administered Date(s) Administered   Fluad Quad(high Dose 65+) 04/08/2020, 03/17/2021, 03/23/2022   Influenza, High Dose Seasonal PF 05/13/2016   PFIZER(Purple Top)SARS-COV-2 Vaccination 04/11/2020, 05/02/2020   Pneumococcal Polysaccharide-23 07/15/2019    TDAP status: Due, Education has been provided regarding the importance of this vaccine. Advised may receive this vaccine at local pharmacy or Health Dept. Aware to provide a copy of the vaccination record if obtained from local pharmacy or Health Dept. Verbalized acceptance and understanding.  Flu Vaccine status: Due, Education has been provided regarding the importance of this vaccine. Advised may receive this vaccine at local pharmacy or Health Dept. Aware to provide a copy of the vaccination record if obtained from local pharmacy or Health Dept. Verbalized acceptance and understanding.  Pneumococcal vaccine status: Due, Education has been provided regarding the importance of this vaccine. Advised may receive this vaccine at local pharmacy or Health Dept. Aware to provide a copy of the vaccination record if obtained from local pharmacy or Health Dept. Verbalized acceptance and understanding.  Covid-19 vaccine status: Declined, Education has been provided regarding the importance of this vaccine but patient still declined. Advised may receive this vaccine at local pharmacy or Health Dept.or vaccine clinic. Aware to provide a copy of the  vaccination record if obtained from local pharmacy or Health Dept. Verbalized acceptance and understanding.  Qualifies for Shingles Vaccine? Yes   Zostavax completed No   Shingrix Completed?: No.    Education has been provided regarding the importance of this vaccine. Patient has been advised to call insurance company to determine out of pocket expense if they have not yet received this vaccine. Advised may also receive vaccine at local pharmacy or Health Dept. Verbalized  acceptance and understanding.  Screening Tests Health Maintenance  Topic Date Due   DTaP/Tdap/Td (1 - Tdap) Never done   Zoster Vaccines- Shingrix (1 of 2) Never done   DEXA SCAN  Never done   COVID-19 Vaccine (3 - Pfizer risk series) 05/30/2020   Pneumonia Vaccine 9+ Years old (2 of 2 - PCV) 07/14/2020   INFLUENZA VACCINE  01/14/2023   Medicare Annual Wellness (AWV)  02/18/2024   HPV VACCINES  Aged Out    Health Maintenance  Health Maintenance Due  Topic Date Due   DTaP/Tdap/Td (1 - Tdap) Never done   Zoster Vaccines- Shingrix (1 of 2) Never done   DEXA SCAN  Never done   COVID-19 Vaccine (3 - Pfizer risk series) 05/30/2020   Pneumonia Vaccine 29+ Years old (2 of 2 - PCV) 07/14/2020   INFLUENZA VACCINE  01/14/2023      Mammogram status: No longer required due to Age.  Bone Density status: Ordered Patient declined. Pt provided with contact info and advised to call to schedule appt.  Lung Cancer Screening: (Low Dose CT Chest recommended if Age 104-80 years, 20 pack-year currently smoking OR have quit w/in 15years.) does not qualify.    Additional Screening:  Hepatitis C Screening: does not qualify;   Vision Screening: Recommended annual ophthalmology exams for early detection of glaucoma and other disorders of the eye. Is the patient up to date with their annual eye exam?  Yes  Who is the provider or what is the name of the office in which the patient attends annual eye exams? Deferred If pt is not established with a provider, would they like to be referred to a provider to establish care? No .   Dental Screening: Recommended annual dental exams for proper oral hygiene    Community Resource Referral / Chronic Care Management:  CRR required this visit?  No   CCM required this visit?  No     Plan:     I have personally reviewed and noted the following in the patient's chart:   Medical and social history Use of alcohol, tobacco or illicit drugs  Current  medications and supplements including opioid prescriptions. Patient is not currently taking opioid prescriptions. Functional ability and status Nutritional status Physical activity Advanced directives List of other physicians Hospitalizations, surgeries, and ER visits in previous 12 months Vitals Screenings to include cognitive, depression, and falls Referrals and appointments  In addition, I have reviewed and discussed with patient certain preventive protocols, quality metrics, and best practice recommendations. A written personalized care plan for preventive services as well as general preventive health recommendations were provided to patient.     Tillie Rung, LPN   06/20/6061   After Visit Summary: (MyChart) Due to this being a telephonic visit, the after visit summary with patients personalized plan was offered to patient via MyChart   Nurse Notes: None

## 2023-02-18 NOTE — Patient Instructions (Addendum)
Tina Jimenez , Thank you for taking time to come for your Medicare Wellness Visit. I appreciate your ongoing commitment to your health goals. Please review the following plan we discussed and let me know if I can assist you in the future.   Referrals/Orders/Follow-Ups/Clinician Recommendations:   This is a list of the screening recommended for you and due dates:  Health Maintenance  Topic Date Due   DTaP/Tdap/Td vaccine (1 - Tdap) Never done   Zoster (Shingles) Vaccine (1 of 2) Never done   DEXA scan (bone density measurement)  Never done   COVID-19 Vaccine (3 - Pfizer risk series) 05/30/2020   Pneumonia Vaccine (2 of 2 - PCV) 07/14/2020   Flu Shot  01/14/2023   Medicare Annual Wellness Visit  02/18/2024   HPV Vaccine  Aged Out    Advanced directives: (Copy Requested) Please bring a copy of your health care power of attorney and living will to the office to be added to your chart at your convenience.  Next Medicare Annual Wellness Visit scheduled for next year: Yes

## 2023-02-22 ENCOUNTER — Other Ambulatory Visit: Payer: Self-pay

## 2023-02-22 MED ORDER — FLUTICASONE FUROATE-VILANTEROL 200-25 MCG/ACT IN AEPB: 1.0000 | INHALATION_SPRAY | Freq: Every day | RESPIRATORY_TRACT | 11 refills | Status: DC

## 2023-02-22 MED ORDER — FLUTICASONE PROPIONATE 50 MCG/ACT NA SUSP: NASAL | 2 refills | Status: DC

## 2023-03-01 ENCOUNTER — Other Ambulatory Visit: Payer: Self-pay | Admitting: Orthopedic Surgery

## 2023-03-01 ENCOUNTER — Other Ambulatory Visit: Payer: Medicare Other

## 2023-03-01 DIAGNOSIS — S4291XA Fracture of right shoulder girdle, part unspecified, initial encounter for closed fracture: Secondary | ICD-10-CM

## 2023-03-12 DIAGNOSIS — J449 Chronic obstructive pulmonary disease, unspecified: Secondary | ICD-10-CM | POA: Diagnosis not present

## 2023-03-23 ENCOUNTER — Ambulatory Visit: Payer: Medicare Other | Attending: Cardiology

## 2023-03-23 DIAGNOSIS — I4891 Unspecified atrial fibrillation: Secondary | ICD-10-CM

## 2023-03-23 LAB — ECHOCARDIOGRAM COMPLETE
Area-P 1/2: 6.02 cm2
S' Lateral: 2.3 cm

## 2023-04-01 ENCOUNTER — Other Ambulatory Visit: Payer: Self-pay

## 2023-04-01 ENCOUNTER — Other Ambulatory Visit: Payer: Self-pay | Admitting: *Deleted

## 2023-04-01 NOTE — Patient Outreach (Signed)
Care Management   Visit Note  04/01/2023 Name: Tina Jimenez MRN: [id number] DOB: September 05, 1940  Subjective: Tina Jimenez is a 82 y.o. year old female who is a primary care patient of Marianne Sofia, Cordelia Poche. The Care Management team was consulted for assistance.      Engaged with patient spoke with patient by telephone.    Goals Addressed             This Visit's Progress    RNCM Care Management Expected Outcome:  Monitor, Self-Manage, and Reduce Symptoms of Hypertension       Current Barriers:  Chronic Disease Management support and education needs related to Effective management of HTN BP Readings from Last 3 Encounters:  02/17/23 (!) 145/76  02/05/23 138/88  02/02/23 (!) 140/86     Planned Interventions: Evaluation of current treatment plan related to hypertension self management and patient's adherence to plan as established by provider. Patient reports no issues with blood pressure but states she has leaky heart valve after her echocardiogram. At this time, she states she does not have any swelling in her legs/feet and states that she spoke with her cardiologist about her Lasix dosing. She states that he advised that she could take every other day, if needed. Followed up with patient about daily weight and she states she has not been weighing due to needing to change the batteries in her scale. Also discussed talking to the cardiologist and pcp about parameters of when to take her fluid pill or not. Education and support provided.  Provided education to patient re: stroke prevention, s/s of heart attack and stroke; Reviewed prescribed diet heart healthy diet. Patient states she is having adequate intake of fluids when taking her Lasix and states if she takes it daily it does cause her to tire out due to frequent bathroom visits. Cardiology advised she can take every other day if needed. Encouraged the patient to follow the plan of care for effective management of her HTN and  heart health.  Reviewed medications with patient and discussed importance of compliance. The patient is compliant with medications and works with the pharm D on a regular basis. States after her last visit she is taking the Lasix as directed.   Discussed plans with patient for ongoing care management follow up and provided patient with direct contact information for care management team; Advised patient, providing education and rationale, to monitor blood pressure daily and record, calling PCP for findings outside established parameters. She monitors her readings at home. Denies any acute changes.;  Reviewed scheduled/upcoming provider appointments including: 04-29-2023 at 1020 am Advised patient to discuss changes in blood pressure or heart health with provider; Provided education on prescribed diet heart healthy diet;  Discussed complications of poorly controlled blood pressure such as heart disease, stroke, circulatory complications, vision complications, kidney impairment, sexual dysfunction;  Screening for signs and symptoms of depression related to chronic disease state;  Assessed social determinant of health barriers;   Symptom Management: Take medications as prescribed   Attend all scheduled provider appointments Call provider office for new concerns or questions  call the Suicide and Crisis Lifeline: 988 call the [place] National Suicide Prevention Lifeline: 954-604-3028 or TTY: (838)558-7321 TTY 671-803-2542) to talk to a trained counselor call 1-800-273-TALK (toll free, 24 hour hotline) if experiencing a Mental Health or Behavioral Health Crisis  check blood pressure weekly learn about high blood pressure call doctor for signs and symptoms of high blood pressure develop an action plan for high  blood pressure keep all doctor appointments take medications for blood pressure exactly as prescribed report new symptoms to your doctor  Follow Up Plan: Telephone follow up appointment with  care management team member scheduled for: 06-03-2023 at 1030 am       RNCM: Care Management  Maintain, Monitor and Self-Manage Symptoms of COPD       Current Barriers:  Care Coordination needs related to cost constraints to inhalers, currently working with the pharm D in a patient with COPD Chronic Disease Management support and education needs related to effective management of COPD  Planned Interventions: Provided patient with basic written and verbal COPD education on self care/management/and exacerbation prevention. States that her breathing is doing good with no issues at this time. States she is compliant with her oxygen usage. Advised patient to track and manage COPD triggers. Education on things that may trigger her COPD. She has been advised that she can use her Lasix every other day to prevent fatigue from frequent bathroom visits. Provided written and verbal instructions on pursed lip breathing and utilized returned demonstration as teach back Provided instruction about proper use of medications used for management of COPD including inhalers. The patient gets stressed out easily over medications and changes. She reads labels and worries about what the medication may do to her. Encouraged the patient to trust the recommendations of the pcp. The patient knows that pharm D support is available if needed. Education and support given.  Advised patient to self assesses COPD action plan zone and make appointment with provider if in the yellow zone for 48 hours without improvement Advised patient to engage in light exercise as tolerated 3-5 days a week to aid in the the management of COPD Provided education about and advised patient to utilize infection prevention strategies to reduce risk of respiratory infection Discussed the importance of adequate rest and management of fatigue with COPD Screening for signs and symptoms of depression related to chronic disease state  Assessed social  determinant of health barriers  Symptom Management: Take medications as prescribed   Attend all scheduled provider appointments Call provider office for new concerns or questions  call the Suicide and Crisis Lifeline: 988 call the [place] National Suicide Prevention Lifeline: 905-651-9545 or TTY: 510-576-6846 TTY 575-344-3108) to talk to a trained counselor call 1-800-273-TALK (toll free, 24 hour hotline) if experiencing a Mental Health or Behavioral Health Crisis  identify and remove indoor air pollutants limit outdoor activity during cold weather listen for public air quality announcements every day do breathing exercises every day develop a rescue plan eliminate symptom triggers at home follow rescue plan if symptoms flare-up  Follow Up Plan: Telephone follow up appointment with care management team member scheduled for: 06-03-2023 at 1030 am          Consent to Services:  Patient was given information about care management services, agreed to services, and gave verbal consent to participate.   Plan: Telephone follow up appointment with care management team member scheduled for: 06-03-2023 1030AM  Danise Edge, BSN RN RN Care Manager  [facility]  Ambulatory Care Management  Direct Number: [phone]

## 2023-04-01 NOTE — Patient Instructions (Addendum)
Visit Information  Thank you for taking time to visit with me today. Please don't hesitate to contact me if I can be of assistance to you before our next scheduled telephone appointment.  Following are the goals we discussed today:   Goals Addressed             This Visit's Progress    RNCM Care Management Expected Outcome:  Monitor, Self-Manage, and Reduce Symptoms of Hypertension       Current Barriers:  Chronic Disease Management support and education needs related to Effective management of HTN BP Readings from Last 3 Encounters:  02/17/23 (!) 145/76  02/05/23 138/88  02/02/23 (!) 140/86     Planned Interventions: Evaluation of current treatment plan related to hypertension self management and patient's adherence to plan as established by provider. Patient reports no issues with blood pressure but states she has leaky heart valve after her echocardiogram. At this time, she states she does not have any swelling in her legs/feet and states that she spoke with her cardiologist about her Lasix dosing. She states that he advised that she could take every other day, if needed. Followed up with patient about daily weight and she states she has not been weighing due to needing to change the batteries in her scale. Also discussed talking to the cardiologist and pcp about parameters of when to take her fluid pill or not. Education and support provided.  Provided education to patient re: stroke prevention, s/s of heart attack and stroke; Reviewed prescribed diet heart healthy diet. Patient states she is having adequate intake of fluids when taking her Lasix and states if she takes it daily it does cause her to tire out due to frequent bathroom visits. Cardiology advised she can take every other day if needed. Encouraged the patient to follow the plan of care for effective management of her HTN and heart health.  Reviewed medications with patient and discussed importance of compliance. The patient  is compliant with medications and works with the pharm D on a regular basis. States after her last visit she is taking the Lasix as directed.   Discussed plans with patient for ongoing care management follow up and provided patient with direct contact information for care management team; Advised patient, providing education and rationale, to monitor blood pressure daily and record, calling PCP for findings outside established parameters. She monitors her readings at home. Denies any acute changes.;  Reviewed scheduled/upcoming provider appointments including: 04-29-2023 at 1020 am Advised patient to discuss changes in blood pressure or heart health with provider; Provided education on prescribed diet heart healthy diet;  Discussed complications of poorly controlled blood pressure such as heart disease, stroke, circulatory complications, vision complications, kidney impairment, sexual dysfunction;  Screening for signs and symptoms of depression related to chronic disease state;  Assessed social determinant of health barriers;   Symptom Management: Take medications as prescribed   Attend all scheduled provider appointments Call provider office for new concerns or questions  call the Suicide and Crisis Lifeline: 988 call the [place] National Suicide Prevention Lifeline: 906 798 1264 or TTY: 605-179-6350 TTY 310-343-3405) to talk to a trained counselor call 1-800-273-TALK (toll free, 24 hour hotline) if experiencing a Mental Health or Behavioral Health Crisis  check blood pressure weekly learn about high blood pressure call doctor for signs and symptoms of high blood pressure develop an action plan for high blood pressure keep all doctor appointments take medications for blood pressure exactly as prescribed report new symptoms to your  doctor  Follow Up Plan: Telephone follow up appointment with care management team member scheduled for: 06-03-2023 at 1030 am       RNCM: Care Management   Maintain, Monitor and Self-Manage Symptoms of COPD       Current Barriers:  Care Coordination needs related to cost constraints to inhalers, currently working with the pharm D in a patient with COPD Chronic Disease Management support and education needs related to effective management of COPD  Planned Interventions: Provided patient with basic written and verbal COPD education on self care/management/and exacerbation prevention. States that her breathing is doing good with no issues at this time. States she is compliant with her oxygen usage. Advised patient to track and manage COPD triggers. Education on things that may trigger her COPD. She has been advised that she can use her Lasix every other day to prevent fatigue from frequent bathroom visits. Provided written and verbal instructions on pursed lip breathing and utilized returned demonstration as teach back Provided instruction about proper use of medications used for management of COPD including inhalers. The patient gets stressed out easily over medications and changes. She reads labels and worries about what the medication may do to her. Encouraged the patient to trust the recommendations of the pcp. The patient knows that pharm D support is available if needed. Education and support given.  Advised patient to self assesses COPD action plan zone and make appointment with provider if in the yellow zone for 48 hours without improvement Advised patient to engage in light exercise as tolerated 3-5 days a week to aid in the the management of COPD Provided education about and advised patient to utilize infection prevention strategies to reduce risk of respiratory infection Discussed the importance of adequate rest and management of fatigue with COPD Screening for signs and symptoms of depression related to chronic disease state  Assessed social determinant of health barriers  Symptom Management: Take medications as prescribed   Attend all  scheduled provider appointments Call provider office for new concerns or questions  call the Suicide and Crisis Lifeline: 988 call the [place] National Suicide Prevention Lifeline: (434) 590-2253 or TTY: (661)856-4297 TTY 864-462-2298) to talk to a trained counselor call 1-800-273-TALK (toll free, 24 hour hotline) if experiencing a Mental Health or Behavioral Health Crisis  identify and remove indoor air pollutants limit outdoor activity during cold weather listen for public air quality announcements every day do breathing exercises every day develop a rescue plan eliminate symptom triggers at home follow rescue plan if symptoms flare-up  Follow Up Plan: Telephone follow up appointment with care management team member scheduled for: 06-03-2023 at 1030 am           Our next appointment is by telephone on 06-03-2023 at 1030 am  Please call the care guide team at [phone] if you need to cancel or reschedule your appointment.   If you are experiencing a Mental Health or Behavioral Health Crisis or need someone to talk to, please call the Suicide and Crisis Lifeline: 988 call the [place] National Suicide Prevention Lifeline: 863-403-7826 or TTY: 562-321-3299 TTY (440)628-3588) to talk to a trained counselor call 1-800-273-TALK (toll free, 24 hour hotline) go to [facility] [address] [place] [phone])   The patient verbalized understanding of instructions, educational materials, and care plan provided today and DECLINED offer to receive copy of patient instructions, educational materials, and care plan.    Danise Edge, BSN RN RN Care Manager  [place]  Ambulatory Care Management  Direct Number: [phone]

## 2023-04-11 DIAGNOSIS — J449 Chronic obstructive pulmonary disease, unspecified: Secondary | ICD-10-CM | POA: Diagnosis not present

## 2023-04-27 ENCOUNTER — Ambulatory Visit: Payer: Medicare Other | Admitting: Physician Assistant

## 2023-04-29 ENCOUNTER — Ambulatory Visit: Payer: Medicare Other | Admitting: Physician Assistant

## 2023-04-30 ENCOUNTER — Other Ambulatory Visit: Payer: Self-pay | Admitting: Physician Assistant

## 2023-05-03 ENCOUNTER — Encounter: Payer: Self-pay | Admitting: Physician Assistant

## 2023-05-03 ENCOUNTER — Ambulatory Visit (INDEPENDENT_AMBULATORY_CARE_PROVIDER_SITE_OTHER): Payer: Medicare Other | Admitting: Physician Assistant

## 2023-05-03 VITALS — BP 142/94 | HR 82 | Temp 97.4°F | Ht 63.0 in | Wt 141.4 lb

## 2023-05-03 DIAGNOSIS — I10 Essential (primary) hypertension: Secondary | ICD-10-CM | POA: Diagnosis not present

## 2023-05-03 DIAGNOSIS — I480 Paroxysmal atrial fibrillation: Secondary | ICD-10-CM | POA: Diagnosis not present

## 2023-05-03 DIAGNOSIS — Z23 Encounter for immunization: Secondary | ICD-10-CM

## 2023-05-03 DIAGNOSIS — E611 Iron deficiency: Secondary | ICD-10-CM

## 2023-05-03 DIAGNOSIS — E782 Mixed hyperlipidemia: Secondary | ICD-10-CM | POA: Diagnosis not present

## 2023-05-03 DIAGNOSIS — R0902 Hypoxemia: Secondary | ICD-10-CM

## 2023-05-03 DIAGNOSIS — E559 Vitamin D deficiency, unspecified: Secondary | ICD-10-CM

## 2023-05-03 DIAGNOSIS — J438 Other emphysema: Secondary | ICD-10-CM | POA: Diagnosis not present

## 2023-05-03 MED ORDER — FLUCONAZOLE 150 MG PO TABS: 150.0000 mg | ORAL_TABLET | Freq: Every day | ORAL | 1 refills | Status: DC

## 2023-05-03 NOTE — Progress Notes (Signed)
Subjective:  Patient ID: Tina Jimenez, female    DOB: 1940-08-09  Age: 82 y.o. MRN: [id number]  Chief Complaint  Patient presents with   Medical Management of Chronic Issues    HPI Pt with chronic afib - she is currently on digoxin 0.125mg  qd and metoprolol 50mg  qd She has been following with cardiology [facility] no problems or concerns today  Pt with hypertension.  BP is elevated today.  She states she has had no chest pain or edema.  Currently on metoprolol 50mg  qd - states when she checks bp at home it has been normal  Pt with emphysema with oxygen use.  States her breathing has been stable.  She does use Breo daily and duoneb as directed  Pt with dyslipidemia - currently on nexlizet 180-10 - due for labwork  Pt with GERD - stable on prilosec 20mg  qd  Pt with iron def anemia - taking daily supplement  Pt requests flu shot today     05/03/2023   10:24 AM 02/18/2023    1:22 PM 10/20/2022   10:07 AM 06/17/2022    2:49 PM 02/19/2022    1:46 PM  Depression screen PHQ 2/9  Decreased Interest 0 0 2 0 0  Down, Depressed, Hopeless 0 0 0 0 0  PHQ - 2 Score 0 0 2 0 0  Altered sleeping 0  0    Tired, decreased energy 0  0    Change in appetite 0  0    Feeling bad or failure about yourself  0  0    Trouble concentrating 0  2    Moving slowly or fidgety/restless 0  0    Suicidal thoughts 0  0    PHQ-9 Score 0  4    Difficult doing work/chores Not difficult at all  Not difficult at all          02/19/2022    1:47 PM 06/17/2022    2:38 PM 10/20/2022   10:07 AM 02/18/2023    1:25 PM 05/03/2023   10:24 AM  Fall Risk  Falls in the past year? 0 0 Exclusion - non ambulatory 0 0  Was there an injury with Fall? 0 0 0 0 0  Fall Risk Category Calculator 0 0 0 0 0  Fall Risk Category (Retired) Low Low     (RETIRED) Patient Fall Risk Level Low fall risk Low fall risk     Patient at Risk for Falls Due to No Fall Risks;Impaired balance/gait Impaired balance/gait;Impaired mobility Impaired  balance/gait;Impaired mobility No Fall Risks No Fall Risks  Fall risk Follow up Falls evaluation completed;Education provided;Falls prevention discussed Falls evaluation completed;Education provided  Falls prevention discussed Falls evaluation completed     ROS CONSTITUTIONAL: Negative for chills, fatigue, fever, unintentional weight gain and unintentional weight loss.  E/N/T: Negative for ear pain, nasal congestion and sore throat.  CARDIOVASCULAR: Negative for chest pain, dizziness, palpitations and pedal edema.  RESPIRATORY: Negative for recent cough and dyspnea.  GASTROINTESTINAL: Negative for abdominal pain, acid reflux symptoms, constipation, diarrhea, nausea and vomiting.  MSK: Negative for arthralgias and myalgias.  INTEGUMENTARY: Negative for rash.  NEUROLOGICAL: Negative for dizziness and headaches.  PSYCHIATRIC: Negative for sleep disturbance and to question depression screen.  Negative for depression, negative for anhedonia.    Current Outpatient Medications:    Ascorbic Acid (VITAMIN C) 500 MG CAPS, Take 1 tablet by mouth daily., Disp: , Rfl:    aspirin EC 81 MG tablet, Take 81 mg  by mouth every other day. Swallow whole., Disp: , Rfl:    aspirin-acetaminophen-caffeine (EXCEDRIN MIGRAINE) 250-250-65 MG tablet, Take 1-2 tablets by mouth every 6 (six) hours as needed for headache., Disp: 30 tablet, Rfl: 2   Co-Enzyme Q-10 100 MG CAPS, Take 100 mg by mouth daily., Disp: , Rfl:    digoxin (LANOXIN) 0.125 MG tablet, TAKE 1 TABLET BY MOUTH EVERY DAY, Disp: 90 tablet, Rfl: 1   DOCUSATE SODIUM PO, Take 200 mg by mouth daily., Disp: , Rfl:    ferrous sulfate 325 (65 FE) MG tablet, 1 po qd with breakfast, Disp: 90 tablet, Rfl: 1   fluconazole (DIFLUCAN) 150 MG tablet, Take 1 tablet (150 mg total) by mouth daily., Disp: 1 tablet, Rfl: 1   fluticasone (FLONASE) 50 MCG/ACT nasal spray, SPRAY 2 SPRAYS INTO EACH NOSTRIL EVERY DAY, Disp: 48 mL, Rfl: 2   fluticasone furoate-vilanterol (BREO  ELLIPTA) 200-25 MCG/ACT AEPB, Inhale 1 puff into the lungs daily., Disp: 1 each, Rfl: 11   furosemide (LASIX) 20 MG tablet, TAKE 1 TABLET BY MOUTH EVERY DAY (Patient taking differently: Take 20 mg by mouth every other day.), Disp: 90 tablet, Rfl: 1   metoprolol tartrate (LOPRESSOR) 50 MG tablet, Take 1 tablet (50 mg total) by mouth 2 (two) times daily., Disp: 180 tablet, Rfl: 0   mupirocin ointment (BACTROBAN) 2 %, Apply 1 Application topically 2 (two) times daily., Disp: 22 g, Rfl: 0   NEXLIZET 180-10 MG TABS, TAKE 1 TABLET BY MOUTH DAILY IN THE AFTERNOON, Disp: 90 tablet, Rfl: 1   omeprazole (PRILOSEC) 20 MG capsule, TAKE 1 CAPSULE BY MOUTH EVERY DAY, Disp: 90 capsule, Rfl: 1   triamcinolone cream (KENALOG) 0.1 %, APPLY TO AFFECTED AREA TWICE A DAY, Disp: 30 g, Rfl: 0   ipratropium-albuterol (DUONEB) 0.5-2.5 (3) MG/3ML SOLN, USE 1 NEBULE IN NEBULIZER EVERY MORNING, NOON, EVENING, AND BEDTIME., Disp: 300 mL, Rfl: 1   LACTOBACILLUS PO, Take 1 tablet by mouth daily., Disp: , Rfl:    loratadine (CLARITIN) 10 MG tablet, TAKE 1 TABLET BY MOUTH ONCE DAILY FOR ALLERGIES, Disp: 90 tablet, Rfl: 1  Past Medical History:  Diagnosis Date   Abdominal pain 10/13/2019   Abnormal blood chemistry 08/22/2019   Acute appendicitis with localized peritonitis 06/25/2016   Acute blood loss anemia 06/22/2019   Acute bronchitis with COPD (HCC) 08/15/2019   Acute cystitis with hematuria 10/26/2019   Acute embolism and thrombosis of deep veins of left upper extremity (HCC) 07/08/2019   Acute hypoxemic respiratory failure (HCC) 07/08/2019   Acute pain of left shoulder 10/26/2019   Acute posthemorrhagic anemia 07/08/2019   Acute pulmonary embolism without acute cor pulmonale (HCC) 06/21/2019   Acute vaginitis 11/12/2022   Adnexal mass    Anemia 01/08/2020   Arm DVT (deep venous thromboembolism), acute, left (HCC) 06/26/2019   Atrial fibrillation (HCC)    Atypical chest pain 03/04/2022   Bladder mass 10/26/2019    Compression fracture of body of thoracic vertebra (HCC) 2020   COPD (chronic obstructive pulmonary disease) (HCC)    COPD without exacerbation (HCC) 08/15/2019   Coronary artery calcification seen on CT scan 05/23/2020   COVID-19 07/08/2019   Dependence on supplemental oxygen 04/14/2020   Effusion, left ankle 08/03/2019   Effusion, left foot 08/03/2019   Effusion, right ankle 08/03/2019   Effusion, right foot 08/03/2019   Elevated CEA 01/19/2018   Elevated white blood cell count, unspecified 07/11/2019   Encounter for other orthopedic aftercare 07/08/2019   Essential (primary)  hypertension 07/08/2019   Gastro-esophageal reflux disease without esophagitis 07/08/2019   History of pulmonary embolism 10/02/2019   Hyperlipemia    Hypertension 08/15/2019   Localized edema 08/22/2019   Mixed dyslipidemia 05/23/2020   Muscle weakness (generalized) 07/08/2019   Osteoporosis    Other disorders of electrolyte and fluid balance, not elsewhere classified 08/10/2019   Other emphysema (HCC) 03/04/2022   Other fatigue 01/08/2020   Oxygen deficiency    Paroxysmal atrial fibrillation (HCC) 06/20/2019   Persistent atrial fibrillation (HCC) 08/22/2019   Pneumonia 05/2019   COVID Pneumonia   Pneumonia due to COVID-19 virus 07/08/2019   Precordial pain 06/20/2019   Pulmonary embolism (HCC) 05/2019   Spinal stenosis, lumbar region without neurogenic claudication 06/14/2019   Unsteadiness on feet 07/08/2019   Vitamin D deficiency 06/22/2019   Wedge compression fracture of unspecified lumbar vertebra, subsequent encounter for fracture with routine healing 07/08/2019   Objective:  PHYSICAL EXAM:   BP (!) 142/94   Pulse 82   Temp (!) 97.4 F (36.3 C) (Temporal)   Ht 5\' 3"  (1.6 m)   Wt 141 lb 6.4 oz (64.1 kg)   SpO2 98%   BMI 25.05 kg/m    GEN: Well nourished, well developed, in no acute distress - in wheelchair using oxygen  Cardiac: RRR; no murmurs, rubs, or gallops,no edema - no  significant varicosities Respiratory:  normal respiratory rate and pattern with no distress - normal breath sounds with no rales, rhonchi, wheezes or rubs  MS: no deformity or atrophy  Skin: warm and dry, no rash   Psych: euthymic mood, appropriate affect and demeanor  Assessment & Plan:    Primary hypertension -     CBC with Differential/Platelet -     Comprehensive metabolic panel -     TSH Continue meds and recheck bp in one month Other emphysema (HCC) Continue meds and oxygen Mixed dyslipidemia -     CBC with Differential/Platelet -     Comprehensive metabolic panel -     TSH -     Lipid panel Continue meds Vitamin D deficiency -     VITAMIN D 25 Hydroxy (Vit-D Deficiency, Fractures)  Paroxysmal atrial fibrillation (HCC) Continue meds and follow up with cardiology as directed Oxygen deficiency  Needs flu shot -     Flu Vaccine Trivalent High Dose (Fluad)  Iron deficiency -     Iron, TIBC and Ferritin Panel  Other orders -     Fluconazole; Take 1 tablet (150 mg total) by mouth daily.  Dispense: 1 tablet; Refill: 1     Follow-up: Return in about 4 months (around 08/31/2023) for chronic fasting follow-up - bp check nurse visit 1 month.  An After Visit Summary was printed and given to the patient.  Jettie Pagan [facility] [phone]

## 2023-05-04 LAB — COMPREHENSIVE METABOLIC PANEL
ALT: 19 [IU]/L (ref 0–32)
AST: 28 [IU]/L (ref 0–40)
Albumin: 4.5 g/dL (ref 3.8–4.8)
Alkaline Phosphatase: 75 [IU]/L (ref 44–121)
BUN/Creatinine Ratio: 25 (ref 12–28)
BUN: 15 mg/dL (ref 8–27)
Bilirubin Total: 0.5 mg/dL (ref 0.0–1.2)
CO2: 32 mmol/L — ABNORMAL HIGH (ref 20–29)
Calcium: 9.9 mg/dL (ref 8.7–10.3)
Chloride: 93 mmol/L — ABNORMAL LOW (ref 96–106)
Creatinine, Ser: 0.61 mg/dL (ref 0.57–1.00)
Globulin, Total: 2.4 g/dL (ref 1.5–4.5)
Glucose: 106 mg/dL — ABNORMAL HIGH (ref 70–99)
Potassium: 4.8 mmol/L (ref 3.5–5.2)
Sodium: 140 mmol/L (ref 134–144)
Total Protein: 6.9 g/dL (ref 6.0–8.5)
eGFR: 90 mL/min/{1.73_m2} (ref >59)

## 2023-05-04 LAB — CBC WITH DIFFERENTIAL/PLATELET
Basophils Absolute: 0.1 10*3/uL (ref 0.0–0.2)
Basos: 1 %
EOS (ABSOLUTE): 0.2 10*3/uL (ref 0.0–0.4)
Eos: 2 %
Hematocrit: 43.5 % (ref 34.0–46.6)
Hemoglobin: 13.8 g/dL (ref 11.1–15.9)
Immature Grans (Abs): 0 10*3/uL (ref 0.0–0.1)
Immature Granulocytes: 0 %
Lymphocytes Absolute: 1.1 10*3/uL (ref 0.7–3.1)
Lymphs: 9 %
MCH: 30 pg (ref 26.6–33.0)
MCHC: 31.7 g/dL (ref 31.5–35.7)
MCV: 95 fL (ref 79–97)
Monocytes Absolute: 0.8 10*3/uL (ref 0.1–0.9)
Monocytes: 7 %
Neutrophils Absolute: 10.1 10*3/uL — ABNORMAL HIGH (ref 1.4–7.0)
Neutrophils: 81 %
Platelets: 217 10*3/uL (ref 150–450)
RBC: 4.6 x10E6/uL (ref 3.77–5.28)
RDW: 12.2 % (ref 11.7–15.4)
WBC: 12.3 10*3/uL — ABNORMAL HIGH (ref 3.4–10.8)

## 2023-05-04 LAB — IRON,TIBC AND FERRITIN PANEL
Ferritin: 641 ng/mL — ABNORMAL HIGH (ref 15–150)
Iron Saturation: 35 % (ref 15–55)
Iron: 112 ug/dL (ref 27–139)
Total Iron Binding Capacity: 323 ug/dL (ref 250–450)
UIBC: 211 ug/dL (ref 118–369)

## 2023-05-04 LAB — TSH: TSH: 2.06 u[IU]/mL (ref 0.450–4.500)

## 2023-05-04 LAB — LIPID PANEL
Chol/HDL Ratio: 2.6 ratio (ref 0.0–4.4)
Cholesterol, Total: 164 mg/dL (ref 100–199)
HDL: 62 mg/dL (ref >39)
LDL Chol Calc (NIH): 86 mg/dL (ref 0–99)
Triglycerides: 84 mg/dL (ref 0–149)
VLDL Cholesterol Cal: 16 mg/dL (ref 5–40)

## 2023-05-04 LAB — VITAMIN D 25 HYDROXY (VIT D DEFICIENCY, FRACTURES): Vit D, 25-Hydroxy: 43.1 ng/mL (ref 30.0–100.0)

## 2023-05-05 ENCOUNTER — Other Ambulatory Visit: Payer: Self-pay | Admitting: Physician Assistant

## 2023-05-05 DIAGNOSIS — R899 Unspecified abnormal finding in specimens from other organs, systems and tissues: Secondary | ICD-10-CM

## 2023-05-20 ENCOUNTER — Other Ambulatory Visit: Payer: Self-pay | Admitting: *Deleted

## 2023-05-20 NOTE — Patient Instructions (Signed)
Visit Information  Thank you for taking time to visit with me today. Please don't hesitate to contact me if I can be of assistance to you before our next scheduled telephone appointment.  Following are the goals we discussed today:   Goals Addressed             This Visit's Progress    RNCM Care Management Expected Outcome:  Monitor, Self-Manage, and Reduce Symptoms of Hypertension       Current Barriers:  Chronic Disease Management support and education needs related to Effective management of HTN BP Readings from Last 3 Encounters:  05/03/23 (!) 142/94  02/17/23 (!) 145/76  02/05/23 138/88     Planned Interventions: Evaluation of current treatment plan related to hypertension self management and patient's adherence to plan as established by provider. Patient states that when she was recently seen in the office her blood pressure was elevated but she had not taken her medication prior because she was having fasting labs drawn. She states she was advised not to take her medication on an empty stomach so she waits until after her visit to eat and take her medicine. She has been instructed to take her BP twice daily and states it ranges anywhere from 130-140/70's and the highest systolic she has seen is in the 150s. She does notice around 3pm that she becomes flushed and that is when her blood pressure is most elevated. RNCM advised that she start checking her BP around that time and keep a log with a time stamp to provide when she follows up in a few weeks for repeat labs & BP check. Provided education to patient re: stroke prevention, s/s of heart attack and stroke; Reviewed prescribed diet heart healthy diet.   Reviewed medications with patient and discussed importance of compliance. The patient is compliant with medications. Discussed plans with patient for ongoing care management follow up and provided patient with direct contact information for care management team; Advised patient,  providing education and rationale, to monitor blood pressure daily and record, calling PCP for findings outside established parameters. She monitors her readings at home. Denies any acute changes.;  Reviewed scheduled/upcoming provider appointments including: 06-03-2023 for repeat labs and BP check and 08-31-2022 with PCP. Advised patient to discuss changes in blood pressure or heart health with provider; Provided education on prescribed diet heart healthy diet;  Discussed complications of poorly controlled blood pressure such as heart disease, stroke, circulatory complications, vision complications, kidney impairment, sexual dysfunction;  Screening for signs and symptoms of depression related to chronic disease state;  Assessed social determinant of health barriers;   Symptom Management: Take medications as prescribed   Attend all scheduled provider appointments Call provider office for new concerns or questions  call the Suicide and Crisis Lifeline: 988 call the [place] National Suicide Prevention Lifeline: 9372230971 or TTY: 501-879-7943 TTY (862)216-7205) to talk to a trained counselor call 1-800-273-TALK (toll free, 24 hour hotline) if experiencing a Mental Health or Behavioral Health Crisis  check blood pressure weekly learn about high blood pressure call doctor for signs and symptoms of high blood pressure develop an action plan for high blood pressure keep all doctor appointments take medications for blood pressure exactly as prescribed report new symptoms to your doctor  Follow Up Plan: Telephone follow up appointment with care management team member scheduled for: 06-24-2023 at 1:00 pm     RNCM: Care Management  Maintain, Monitor and Self-Manage Symptoms of COPD       Current  Barriers:  Care Coordination needs related to cost constraints to inhalers, currently working with the pharm D in a patient with COPD Chronic Disease Management support and education needs related to  effective management of COPD  Planned Interventions: Provided patient with basic written and verbal COPD education on self care/management/and exacerbation prevention. States that her breathing is doing good but she does get labored at times with her frequent bathroom breaks due to her diuretic. She continues on 3L via [place] with her oxygen saturation ranging from 97-98%. Advised patient to track and manage COPD triggers. Education on things that may trigger her COPD. She  states that has been advised by her cardiologist that she can use her Lasix every other day to prevent fatigue from frequent bathroom visits. Provided written and verbal instructions on pursed lip breathing and utilized returned demonstration as teach back Provided instruction about proper use of medications used for management of COPD including inhalers. Reports compliance with Breo & Albuterol.  Advised patient to self assesses COPD action plan zone and make appointment with provider if in the yellow zone for 48 hours without improvement Advised patient to engage in light exercise as tolerated 3-5 days a week to aid in the the management of COPD Provided education about and advised patient to utilize infection prevention strategies to reduce risk of respiratory infection Discussed the importance of adequate rest and management of fatigue with COPD Screening for signs and symptoms of depression related to chronic disease state  Assessed social determinant of health barriers  Symptom Management: Take medications as prescribed   Attend all scheduled provider appointments Call provider office for new concerns or questions  call the Suicide and Crisis Lifeline: 988 call the [place] National Suicide Prevention Lifeline: 443-105-2663 or TTY: 772-824-8203 TTY 226-190-2486) to talk to a trained counselor call 1-800-273-TALK (toll free, 24 hour hotline) if experiencing a Mental Health or Behavioral Health Crisis  identify and remove  indoor air pollutants limit outdoor activity during cold weather listen for public air quality announcements every day do breathing exercises every day develop a rescue plan eliminate symptom triggers at home follow rescue plan if symptoms flare-up  Follow Up Plan: Telephone follow up appointment with care management team member scheduled for: 06-24-2023 at 1:00 pm           Our next appointment is by telephone on 06-24-2023 at 1:00 pm  Please call the care guide team at [phone] if you need to cancel or reschedule your appointment.   If you are experiencing a Mental Health or Behavioral Health Crisis or need someone to talk to, please call the Suicide and Crisis Lifeline: 988 call the [place] National Suicide Prevention Lifeline: (651) 236-0123 or TTY: 386 188 2536 TTY 4704128997) to talk to a trained counselor call 1-800-273-TALK (toll free, 24 hour hotline) call 911   The patient verbalized understanding of instructions, educational materials, and care plan provided today and DECLINED offer to receive copy of patient instructions, educational materials, and care plan.   Telephone follow up appointment with care management team member scheduled for:06-24-2023 at 1:00 pm  Danise Edge, BSN RN RN Care Manager  [facility]  Ambulatory Care Management  Direct Number: [phone]

## 2023-05-20 NOTE — Patient Outreach (Signed)
Care Management   Visit Note  05/20/2023 Name: Tina Jimenez MRN: [id number] DOB: 02/16/1941  Subjective: Tina Jimenez is a 82 y.o. year old female who is a primary care patient of Marianne Sofia, Cordelia Poche. The Care Management team was consulted for assistance.      Engaged with patient spoke with patient by telephone.    Goals Addressed             This Visit's Progress    RNCM Care Management Expected Outcome:  Monitor, Self-Manage, and Reduce Symptoms of Hypertension       Current Barriers:  Chronic Disease Management support and education needs related to Effective management of HTN BP Readings from Last 3 Encounters:  05/03/23 (!) 142/94  02/17/23 (!) 145/76  02/05/23 138/88     Planned Interventions: Evaluation of current treatment plan related to hypertension self management and patient's adherence to plan as established by provider. Patient states that when she was recently seen in the office her blood pressure was elevated but she had not taken her medication prior because she was having fasting labs drawn. She states she was advised not to take her medication on an empty stomach so she waits until after her visit to eat and take her medicine. She has been instructed to take her BP twice daily and states it ranges anywhere from 130-140/70's and the highest systolic she has seen is in the 150s. She does notice around 3pm that she becomes flushed and that is when her blood pressure is most elevated. RNCM advised that she start checking her BP around that time and keep a log with a time stamp to provide when she follows up in a few weeks for repeat labs & BP check. Provided education to patient re: stroke prevention, s/s of heart attack and stroke; Reviewed prescribed diet heart healthy diet.   Reviewed medications with patient and discussed importance of compliance. The patient is compliant with medications. Discussed plans with patient for ongoing care management follow  up and provided patient with direct contact information for care management team; Advised patient, providing education and rationale, to monitor blood pressure daily and record, calling PCP for findings outside established parameters. She monitors her readings at home. Denies any acute changes.;  Reviewed scheduled/upcoming provider appointments including: 06-03-2023 for repeat labs and BP check and 08-31-2022 with PCP. Advised patient to discuss changes in blood pressure or heart health with provider; Provided education on prescribed diet heart healthy diet;  Discussed complications of poorly controlled blood pressure such as heart disease, stroke, circulatory complications, vision complications, kidney impairment, sexual dysfunction;  Screening for signs and symptoms of depression related to chronic disease state;  Assessed social determinant of health barriers;   Symptom Management: Take medications as prescribed   Attend all scheduled provider appointments Call provider office for new concerns or questions  call the Suicide and Crisis Lifeline: 988 call the [place] National Suicide Prevention Lifeline: 6148269126 or TTY: 445-683-9011 TTY (785)368-1181) to talk to a trained counselor call 1-800-273-TALK (toll free, 24 hour hotline) if experiencing a Mental Health or Behavioral Health Crisis  check blood pressure weekly learn about high blood pressure call doctor for signs and symptoms of high blood pressure develop an action plan for high blood pressure keep all doctor appointments take medications for blood pressure exactly as prescribed report new symptoms to your doctor  Follow Up Plan: Telephone follow up appointment with care management team member scheduled for: 06-24-2023 at 1:00 pm  RNCM: Care Management  Maintain, Monitor and Self-Manage Symptoms of COPD       Current Barriers:  Care Coordination needs related to cost constraints to inhalers, currently working with the pharm  D in a patient with COPD Chronic Disease Management support and education needs related to effective management of COPD  Planned Interventions: Provided patient with basic written and verbal COPD education on self care/management/and exacerbation prevention. States that her breathing is doing good but she does get labored at times with her frequent bathroom breaks due to her diuretic. She continues on 3L via [place] with her oxygen saturation ranging from 97-98%. Advised patient to track and manage COPD triggers. Education on things that may trigger her COPD. She  states that has been advised by her cardiologist that she can use her Lasix every other day to prevent fatigue from frequent bathroom visits. Provided written and verbal instructions on pursed lip breathing and utilized returned demonstration as teach back Provided instruction about proper use of medications used for management of COPD including inhalers. Reports compliance with Breo & Albuterol.  Advised patient to self assesses COPD action plan zone and make appointment with provider if in the yellow zone for 48 hours without improvement Advised patient to engage in light exercise as tolerated 3-5 days a week to aid in the the management of COPD Provided education about and advised patient to utilize infection prevention strategies to reduce risk of respiratory infection Discussed the importance of adequate rest and management of fatigue with COPD Screening for signs and symptoms of depression related to chronic disease state  Assessed social determinant of health barriers  Symptom Management: Take medications as prescribed   Attend all scheduled provider appointments Call provider office for new concerns or questions  call the Suicide and Crisis Lifeline: 988 call the [place] National Suicide Prevention Lifeline: 613-241-1220 or TTY: 763-035-4519 TTY 207-811-3542) to talk to a trained counselor call 1-800-273-TALK (toll free, 24 hour  hotline) if experiencing a Mental Health or Behavioral Health Crisis  identify and remove indoor air pollutants limit outdoor activity during cold weather listen for public air quality announcements every day do breathing exercises every day develop a rescue plan eliminate symptom triggers at home follow rescue plan if symptoms flare-up  Follow Up Plan: Telephone follow up appointment with care management team member scheduled for: 06-24-2023 at 1:00 pm             Consent to Services:  Patient was given information about care management services, agreed to services, and gave verbal consent to participate.   Plan: Telephone follow up appointment with care management team member scheduled for:06-24-2023 at 1:00 pm  Danise Edge, BSN RN RN Care Manager  [facility]  Ambulatory Care Management  Direct Number: [phone]

## 2023-05-31 ENCOUNTER — Telehealth: Payer: Self-pay

## 2023-05-31 NOTE — Telephone Encounter (Signed)
Called Charity at [facility], and made her aware patient has been on Oxygen for 5+ years and our office did not start her on it. Sent last office notes.

## 2023-05-31 NOTE — Telephone Encounter (Signed)
Please advise if we got any paperwork from them

## 2023-05-31 NOTE — Telephone Encounter (Signed)
I have no orders or paperwork from them

## 2023-05-31 NOTE — Telephone Encounter (Signed)
Copied from CRM [id number]. Topic: Clinical - Medication Refill >> May 31, 2023 10:01 AM Hector Shade B wrote: Reason for CRM: Karolee Stamps from [facility] called [phone]) In regards to the patient being referred over to them or oxygen and supplies. Ms. Karolee Stamps stated that paperwork had been sent out in order to supply the patient with all necessary equipment, but they've not heard back from the office.  Please call Ms. Karolee Stamps at [phone]

## 2023-06-03 ENCOUNTER — Other Ambulatory Visit: Payer: Medicare Other

## 2023-06-03 ENCOUNTER — Other Ambulatory Visit: Payer: Self-pay | Admitting: Physician Assistant

## 2023-06-03 DIAGNOSIS — I10 Essential (primary) hypertension: Secondary | ICD-10-CM

## 2023-06-03 DIAGNOSIS — R899 Unspecified abnormal finding in specimens from other organs, systems and tissues: Secondary | ICD-10-CM | POA: Diagnosis not present

## 2023-06-03 MED ORDER — AMLODIPINE BESYLATE 5 MG PO TABS: 5.0000 mg | ORAL_TABLET | Freq: Every day | ORAL | 2 refills | Status: DC

## 2023-06-04 LAB — CBC WITH DIFFERENTIAL/PLATELET
Basophils Absolute: 0.1 10*3/uL (ref 0.0–0.2)
Basos: 1 %
EOS (ABSOLUTE): 0.3 10*3/uL (ref 0.0–0.4)
Eos: 2 %
Hematocrit: 42.9 % (ref 34.0–46.6)
Hemoglobin: 13.8 g/dL (ref 11.1–15.9)
Immature Grans (Abs): 0 10*3/uL (ref 0.0–0.1)
Immature Granulocytes: 0 %
Lymphocytes Absolute: 1.2 10*3/uL (ref 0.7–3.1)
Lymphs: 10 %
MCH: 30.3 pg (ref 26.6–33.0)
MCHC: 32.2 g/dL (ref 31.5–35.7)
MCV: 94 fL (ref 79–97)
Monocytes Absolute: 1 10*3/uL — ABNORMAL HIGH (ref 0.1–0.9)
Monocytes: 9 %
Neutrophils Absolute: 9.4 10*3/uL — ABNORMAL HIGH (ref 1.4–7.0)
Neutrophils: 78 %
Platelets: 191 10*3/uL (ref 150–450)
RBC: 4.56 x10E6/uL (ref 3.77–5.28)
RDW: 11.7 % (ref 11.7–15.4)
WBC: 12 10*3/uL — ABNORMAL HIGH (ref 3.4–10.8)

## 2023-06-04 LAB — IRON,TIBC AND FERRITIN PANEL
Ferritin: 495 ng/mL — ABNORMAL HIGH (ref 15–150)
Iron Saturation: 14 % — ABNORMAL LOW (ref 15–55)
Iron: 44 ug/dL (ref 27–139)
Total Iron Binding Capacity: 322 ug/dL (ref 250–450)
UIBC: 278 ug/dL (ref 118–369)

## 2023-06-23 ENCOUNTER — Ambulatory Visit: Payer: Medicare Other | Admitting: Physician Assistant

## 2023-06-23 ENCOUNTER — Encounter: Payer: Self-pay | Admitting: Physician Assistant

## 2023-06-23 VITALS — BP 132/82 | HR 82 | Temp 97.3°F | Ht 63.0 in | Wt 141.0 lb

## 2023-06-23 DIAGNOSIS — I10 Essential (primary) hypertension: Secondary | ICD-10-CM | POA: Diagnosis not present

## 2023-06-23 NOTE — Progress Notes (Signed)
 Subjective:  Patient ID: Tina Jimenez, female    DOB: March 29, 1941  Age: 83 y.o. MRN: [id number]  Chief Complaint  Patient presents with   Hypertension    3 week follow up    HPI  Pt in today for bp follow up after adding norvasc  5mg  qd to her current regimen.  She states she is tolerating medication well and voices no concerns or complaints Denies chest pain or shortness of breath and is having no extremity swelling    05/03/2023   10:24 AM 02/18/2023    1:22 PM 10/20/2022   10:07 AM 06/17/2022    2:49 PM 02/19/2022    1:46 PM  Depression screen PHQ 2/9  Decreased Interest 0 0 2 0 0  Down, Depressed, Hopeless 0 0 0 0 0  PHQ - 2 Score 0 0 2 0 0  Altered sleeping 0  0    Tired, decreased energy 0  0    Change in appetite 0  0    Feeling bad or failure about yourself  0  0    Trouble concentrating 0  2    Moving slowly or fidgety/restless 0  0    Suicidal thoughts 0  0    PHQ-9 Score 0  4    Difficult doing work/chores Not difficult at all  Not difficult at all          06/17/2022    2:38 PM 10/20/2022   10:07 AM 02/18/2023    1:25 PM 05/03/2023   10:24 AM 05/20/2023    1:04 PM  Fall Risk  Falls in the past year? 0 Exclusion - non ambulatory 0 0 0  Was there an injury with Fall? 0 0 0 0 0  Fall Risk Category Calculator 0 0 0 0 0  Fall Risk Category (Retired) Low      (RETIRED) Patient Fall Risk Level Low fall risk      Patient at Risk for Falls Due to Impaired balance/gait;Impaired mobility Impaired balance/gait;Impaired mobility No Fall Risks No Fall Risks No Fall Risks  Fall risk Follow up Falls evaluation completed;Education provided  Falls prevention discussed Falls evaluation completed      ROS CONSTITUTIONAL: Negative for chills, fatigue, fever,    CARDIOVASCULAR: Negative for chest pain, dizziness, palpitations and pedal edema.  RESPIRATORY: Negative for recent cough and dyspnea.  GASTROINTESTINAL: Negative for abdominal pain, acid reflux symptoms, constipation,  diarrhea, nausea and vomiting.     Current Outpatient Medications:    amLODipine  (NORVASC ) 5 MG tablet, Take 1 tablet (5 mg total) by mouth daily., Disp: 30 tablet, Rfl: 2   Ascorbic Acid (VITAMIN C) 500 MG CAPS, Take 1 tablet by mouth daily., Disp: , Rfl:    aspirin  EC 81 MG tablet, Take 81 mg by mouth every other day. Swallow whole., Disp: , Rfl:    aspirin -acetaminophen -caffeine  (EXCEDRIN  MIGRAINE) 250-250-65 MG tablet, Take 1-2 tablets by mouth every 6 (six) hours as needed for headache., Disp: 30 tablet, Rfl: 2   Cholecalciferol (VITAMIN D ) 50 MCG (2000 UT) CAPS, Take by mouth., Disp: , Rfl:    Co-Enzyme Q-10 100 MG CAPS, Take 100 mg by mouth daily., Disp: , Rfl:    digoxin  (LANOXIN ) 0.125 MG tablet, TAKE 1 TABLET BY MOUTH EVERY DAY, Disp: 90 tablet, Rfl: 1   DOCUSATE SODIUM  PO, Take 200 mg by mouth daily., Disp: , Rfl:    fluticasone  (FLONASE ) 50 MCG/ACT nasal spray, SPRAY 2 SPRAYS INTO EACH NOSTRIL EVERY DAY, Disp:  48 mL, Rfl: 2   fluticasone  furoate-vilanterol (BREO ELLIPTA ) 200-25 MCG/ACT AEPB, Inhale 1 puff into the lungs daily., Disp: 1 each, Rfl: 11   furosemide  (LASIX ) 20 MG tablet, TAKE 1 TABLET BY MOUTH EVERY DAY (Patient taking differently: Take 20 mg by mouth every other day.), Disp: 90 tablet, Rfl: 1   ipratropium-albuterol  (DUONEB) 0.5-2.5 (3) MG/3ML SOLN, USE 1 NEBULE IN NEBULIZER EVERY MORNING, NOON, EVENING, AND BEDTIME., Disp: 300 mL, Rfl: 1   LACTOBACILLUS PO, Take 1 tablet by mouth daily., Disp: , Rfl:    loratadine  (CLARITIN ) 10 MG tablet, TAKE 1 TABLET BY MOUTH ONCE DAILY FOR ALLERGIES, Disp: 90 tablet, Rfl: 1   metoprolol  tartrate (LOPRESSOR ) 50 MG tablet, Take 1 tablet (50 mg total) by mouth 2 (two) times daily., Disp: 180 tablet, Rfl: 0   mupirocin  ointment (BACTROBAN ) 2 %, Apply 1 Application topically 2 (two) times daily., Disp: 22 g, Rfl: 0   NEXLIZET  180-10 MG TABS, TAKE 1 TABLET BY MOUTH DAILY IN THE AFTERNOON, Disp: 90 tablet, Rfl: 1   omeprazole  (PRILOSEC)  20 MG capsule, TAKE 1 CAPSULE BY MOUTH EVERY DAY, Disp: 90 capsule, Rfl: 1   triamcinolone  cream (KENALOG ) 0.1 %, APPLY TO AFFECTED AREA TWICE A DAY, Disp: 30 g, Rfl: 0  Past Medical History:  Diagnosis Date   Abdominal pain 10/13/2019   Abnormal blood chemistry 08/22/2019   Acute appendicitis with localized peritonitis 06/25/2016   Acute blood loss anemia 06/22/2019   Acute bronchitis with COPD (HCC) 08/15/2019   Acute cystitis with hematuria 10/26/2019   Acute embolism and thrombosis of deep veins of left upper extremity (HCC) 07/08/2019   Acute hypoxemic respiratory failure (HCC) 07/08/2019   Acute pain of left shoulder 10/26/2019   Acute posthemorrhagic anemia 07/08/2019   Acute pulmonary embolism without acute cor pulmonale (HCC) 06/21/2019   Acute vaginitis 11/12/2022   Adnexal mass    Anemia 01/08/2020   Arm DVT (deep venous thromboembolism), acute, left (HCC) 06/26/2019   Atrial fibrillation (HCC)    Atypical chest pain 03/04/2022   Bladder mass 10/26/2019   Compression fracture of body of thoracic vertebra (HCC) 2020   COPD (chronic obstructive pulmonary disease) (HCC)    COPD without exacerbation (HCC) 08/15/2019   Coronary artery calcification seen on CT scan 05/23/2020   COVID-19 07/08/2019   Dependence on supplemental oxygen  04/14/2020   Effusion, left ankle 08/03/2019   Effusion, left foot 08/03/2019   Effusion, right ankle 08/03/2019   Effusion, right foot 08/03/2019   Elevated CEA 01/19/2018   Elevated white blood cell count, unspecified 07/11/2019   Encounter for other orthopedic aftercare 07/08/2019   Essential (primary) hypertension 07/08/2019   Gastro-esophageal reflux disease without esophagitis 07/08/2019   History of pulmonary embolism 10/02/2019   Hyperlipemia    Hypertension 08/15/2019   Localized edema 08/22/2019   Mixed dyslipidemia 05/23/2020   Muscle weakness (generalized) 07/08/2019   Osteoporosis    Other disorders of electrolyte and fluid  balance, not elsewhere classified 08/10/2019   Other emphysema (HCC) 03/04/2022   Other fatigue 01/08/2020   Oxygen  deficiency    Paroxysmal atrial fibrillation (HCC) 06/20/2019   Persistent atrial fibrillation (HCC) 08/22/2019   Pneumonia 05/2019   COVID Pneumonia   Pneumonia due to COVID-19 virus 07/08/2019   Precordial pain 06/20/2019   Pulmonary embolism (HCC) 05/2019   Spinal stenosis, lumbar region without neurogenic claudication 06/14/2019   Unsteadiness on feet 07/08/2019   Vitamin D  deficiency 06/22/2019   Wedge compression fracture of unspecified lumbar  vertebra, subsequent encounter for fracture with routine healing 07/08/2019   Objective:  PHYSICAL EXAM:   BP 132/82 (BP Location: Left Arm, Patient Position: Sitting)   Pulse 82   Temp (!) 97.3 F (36.3 C) (Temporal)   Ht 5' 3 (1.6 m)   Wt 141 lb (64 kg)   SpO2 96%   BMI 24.98 kg/m    GEN: Well nourished, well developed, in no acute distress   Cardiac: RRR; no murmurs,  Respiratory:  normal respiratory rate and pattern with no distress - normal breath sounds with no rales, rhonchi, wheezes or rubs Skin: warm and dry, no rash  Psych: euthymic mood, appropriate affect and demeanor  Assessment & Plan:    Primary hypertension Continue current meds as directed Follow up as scheduled for next chronic visit     Follow-up: Return for as scheduled for next chronic visit.  An After Visit Summary was printed and given to the patient.  CAMIE JONELLE NICHOLAUS DEVONNA [facility] [phone]

## 2023-06-24 ENCOUNTER — Other Ambulatory Visit: Payer: Self-pay | Admitting: *Deleted

## 2023-06-24 NOTE — Patient Outreach (Signed)
 Care Management   Visit Note  06/24/2023 Name: Tina Jimenez MRN: [id number] DOB: 02-26-1941  Subjective: Tina Jimenez is a 83 y.o. year old female who is a primary care patient of [facility], [facility]. The Care Management team was consulted for assistance.      Engaged with patient spoke with patient by telephone.    Goals Addressed             This Visit's Progress    RNCM Care Management Expected Outcome:  Monitor, Self-Manage, and Reduce Symptoms of Hypertension       Current Barriers:  Chronic Disease Management support and education needs related to Effective management of HTN BP Readings from Last 3 Encounters:  06/23/23 132/82  05/03/23 (!) 142/94  02/17/23 (!) 145/76     Planned Interventions: Evaluation of current treatment plan related to hypertension self management and patient's adherence to plan as established by provider. BP has now stabilize with the addition of Norvasc . Patient states she checks her blood daily around 3 pm. Provided education to patient re: stroke prevention, s/s of heart attack and stroke; Reviewed prescribed diet heart healthy diet.   Reviewed medications with patient and discussed importance of compliance. The patient is compliant with medications. Discussed plans with patient for ongoing care management follow up and provided patient with direct contact information for care management team; Advised patient, providing education and rationale, to monitor blood pressure daily and record, calling PCP for findings outside established parameters. She monitors her readings at home. Denies any acute changes.;  Reviewed scheduled/upcoming provider appointments including: 08-31-2023 with PCP Advised patient to discuss changes in blood pressure or heart health with provider; Provided education on prescribed diet heart healthy diet;  Discussed complications of poorly controlled blood pressure such as heart disease, stroke, circulatory  complications, vision complications, kidney impairment, sexual dysfunction;  Screening for signs and symptoms of depression related to chronic disease state;  Assessed social determinant of health barriers;   Symptom Management: Take medications as prescribed   Attend all scheduled provider appointments Call provider office for new concerns or questions  call the Suicide and Crisis Lifeline: 988 call the [place]  National Suicide Prevention Lifeline: 234-535-1603 or TTY: 607-042-3192 TTY (908)061-9046) to talk to a trained counselor call 1-800-273-TALK (toll free, 24 hour hotline) if experiencing a Mental Health or Behavioral Health Crisis  check blood pressure weekly learn about high blood pressure call doctor for signs and symptoms of high blood pressure develop an action plan for high blood pressure keep all doctor appointments take medications for blood pressure exactly as prescribed report new symptoms to your doctor  Follow Up Plan: Telephone follow up appointment with care management team member scheduled for: 08-23-2023 at 1:00 pm     RNCM: Care Management  Maintain, Monitor and Self-Manage Symptoms of COPD       Current Barriers:  Care Coordination needs related to cost constraints to inhalers, currently working with the pharm D in a patient with COPD Chronic Disease Management support and education needs related to effective management of COPD  Planned Interventions: Provided patient with basic written and verbal COPD education on self care/management/and exacerbation prevention. She continues on 3L via [place] with her oxygen  saturation ranging from 96-97%. Advised patient to track and manage COPD triggers. Education on things that may trigger her COPD.  Provided written and verbal instructions on pursed lip breathing and utilized returned demonstration as teach back Provided instruction about proper use of medications used for management of COPD  including inhalers. Reports  compliance with Breo & Albuterol .  Advised patient to self assesses COPD action plan zone and make appointment with provider if in the yellow zone for 48 hours without improvement Advised patient to engage in light exercise as tolerated 3-5 days a week to aid in the the management of COPD Provided education about and advised patient to utilize infection prevention strategies to reduce risk of respiratory infection Discussed the importance of adequate rest and management of fatigue with COPD Screening for signs and symptoms of depression related to chronic disease state  Assessed social determinant of health barriers  Symptom Management: Take medications as prescribed   Attend all scheduled provider appointments Call provider office for new concerns or questions  call the Suicide and Crisis Lifeline: 988 call the [place]  National Suicide Prevention Lifeline: 220-289-2027 or TTY: 339-866-4183 TTY (214)241-0227) to talk to a trained counselor call 1-800-273-TALK (toll free, 24 hour hotline) if experiencing a Mental Health or Behavioral Health Crisis  identify and remove indoor air pollutants limit outdoor activity during cold weather listen for public air quality announcements every day do breathing exercises every day develop a rescue plan eliminate symptom triggers at home follow rescue plan if symptoms flare-up  Follow Up Plan: Telephone follow up appointment with care management team member scheduled for: 08-23-2023 at 1:00 pm              Consent to Services:  Patient was given information about care management services, agreed to services, and gave verbal consent to participate.   Plan: Telephone follow up appointment with care management team member scheduled for:08-23-2023 at 1:00 pm  Rosina Forte, BSN RN RN Care Manager  [facility]  Ambulatory Care Management  Direct Number: [phone]

## 2023-06-24 NOTE — Patient Instructions (Signed)
 Visit Information  Thank you for taking time to visit with me today. Please don't hesitate to contact me if I can be of assistance to you before our next scheduled telephone appointment.  Following are the goals we discussed today:   Goals Addressed             This Visit's Progress    RNCM Care Management Expected Outcome:  Monitor, Self-Manage, and Reduce Symptoms of Hypertension       Current Barriers:  Chronic Disease Management support and education needs related to Effective management of HTN BP Readings from Last 3 Encounters:  06/23/23 132/82  05/03/23 (!) 142/94  02/17/23 (!) 145/76     Planned Interventions: Evaluation of current treatment plan related to hypertension self management and patient's adherence to plan as established by provider. BP has now stabilize with the addition of Norvasc . Patient states she checks her blood daily around 3 pm. Provided education to patient re: stroke prevention, s/s of heart attack and stroke; Reviewed prescribed diet heart healthy diet.   Reviewed medications with patient and discussed importance of compliance. The patient is compliant with medications. Discussed plans with patient for ongoing care management follow up and provided patient with direct contact information for care management team; Advised patient, providing education and rationale, to monitor blood pressure daily and record, calling PCP for findings outside established parameters. She monitors her readings at home. Denies any acute changes.;  Reviewed scheduled/upcoming provider appointments including: 08-31-2023 with PCP Advised patient to discuss changes in blood pressure or heart health with provider; Provided education on prescribed diet heart healthy diet;  Discussed complications of poorly controlled blood pressure such as heart disease, stroke, circulatory complications, vision complications, kidney impairment, sexual dysfunction;  Screening for signs and symptoms  of depression related to chronic disease state;  Assessed social determinant of health barriers;   Symptom Management: Take medications as prescribed   Attend all scheduled provider appointments Call provider office for new concerns or questions  call the Suicide and Crisis Lifeline: 988 call the [place]  National Suicide Prevention Lifeline: 2700916536 or TTY: 216-109-6529 TTY [phone]) to talk to a trained counselor call 1-800-273-TALK (toll free, 24 hour hotline) if experiencing a Mental Health or Behavioral Health Crisis  check blood pressure weekly learn about high blood pressure call doctor for signs and symptoms of high blood pressure develop an action plan for high blood pressure keep all doctor appointments take medications for blood pressure exactly as prescribed report new symptoms to your doctor  Follow Up Plan: Telephone follow up appointment with care management team member scheduled for: 08-23-2023 at 1:00 pm     RNCM: Care Management  Maintain, Monitor and Self-Manage Symptoms of COPD       Current Barriers:  Care Coordination needs related to cost constraints to inhalers, currently working with the pharm D in a patient with COPD Chronic Disease Management support and education needs related to effective management of COPD  Planned Interventions: Provided patient with basic written and verbal COPD education on self care/management/and exacerbation prevention. She continues on 3L via [place] with her oxygen  saturation ranging from 96-97%. Advised patient to track and manage COPD triggers. Education on things that may trigger her COPD.  Provided written and verbal instructions on pursed lip breathing and utilized returned demonstration as teach back Provided instruction about proper use of medications used for management of COPD including inhalers. Reports compliance with Breo & Albuterol .  Advised patient to self assesses COPD action plan zone and  make appointment with  provider if in the yellow zone for 48 hours without improvement Advised patient to engage in light exercise as tolerated 3-5 days a week to aid in the the management of COPD Provided education about and advised patient to utilize infection prevention strategies to reduce risk of respiratory infection Discussed the importance of adequate rest and management of fatigue with COPD Screening for signs and symptoms of depression related to chronic disease state  Assessed social determinant of health barriers  Symptom Management: Take medications as prescribed   Attend all scheduled provider appointments Call provider office for new concerns or questions  call the Suicide and Crisis Lifeline: 988 call the [place]  National Suicide Prevention Lifeline: 956-325-4393 or TTY: 6515506153 TTY 267 806 4642) to talk to a trained counselor call 1-800-273-TALK (toll free, 24 hour hotline) if experiencing a Mental Health or Behavioral Health Crisis  identify and remove indoor air pollutants limit outdoor activity during cold weather listen for public air quality announcements every day do breathing exercises every day develop a rescue plan eliminate symptom triggers at home follow rescue plan if symptoms flare-up  Follow Up Plan: Telephone follow up appointment with care management team member scheduled for: 08-23-2023 at 1:00 pm           Our next appointment is by telephone on 08-23-2023 at 1:00 pm  Please call the care guide team at [phone] if you need to cancel or reschedule your appointment.   If you are experiencing a Mental Health or Behavioral Health Crisis or need someone to talk to, please call the Suicide and Crisis Lifeline: 988 call the [place]  National Suicide Prevention Lifeline: (360) 841-1073 or TTY: 832-247-8237 TTY 507-812-5384) to talk to a trained counselor call 1-800-273-TALK (toll free, 24 hour hotline) call 911   The patient verbalized understanding of instructions,  educational materials, and care plan provided today and DECLINED offer to receive copy of patient instructions, educational materials, and care plan.   Telephone follow up appointment with care management team member scheduled for:08-23-2023 at 1:00 pm  Rosina Forte, BSN RN RN Care Manager  [facility]  Ambulatory Care Management  Direct Number: [phone]

## 2023-06-29 DIAGNOSIS — I3139 Other pericardial effusion (noninflammatory): Secondary | ICD-10-CM | POA: Diagnosis not present

## 2023-07-07 ENCOUNTER — Other Ambulatory Visit: Payer: Self-pay | Admitting: Physician Assistant

## 2023-07-26 ENCOUNTER — Encounter: Payer: Self-pay | Admitting: Internal Medicine

## 2023-08-14 DEATH — deceased

## 2023-08-23 ENCOUNTER — Other Ambulatory Visit: Payer: Medicare Other | Admitting: *Deleted

## 2023-08-29 ENCOUNTER — Other Ambulatory Visit: Payer: Self-pay | Admitting: Physician Assistant

## 2023-08-29 DIAGNOSIS — I10 Essential (primary) hypertension: Secondary | ICD-10-CM

## 2023-08-31 ENCOUNTER — Ambulatory Visit: Payer: Medicare Other | Admitting: Physician Assistant
